# Patient Record
Sex: Male | Born: 1940 | Race: White | Hispanic: No | Marital: Married | Smoking: Never smoker
Health system: Southern US, Community
[De-identification: ages and names within clinical notes are randomized; demographics above are authoritative.]

## PROBLEM LIST (undated history)

## (undated) DIAGNOSIS — K8020 Calculus of gallbladder without cholecystitis without obstruction: Secondary | ICD-10-CM

## (undated) DIAGNOSIS — E669 Obesity, unspecified: Secondary | ICD-10-CM

## (undated) DIAGNOSIS — E1169 Type 2 diabetes mellitus with other specified complication: Secondary | ICD-10-CM

## (undated) DIAGNOSIS — I10 Essential (primary) hypertension: Secondary | ICD-10-CM

## (undated) DIAGNOSIS — L8990 Pressure ulcer of unspecified site, unspecified stage: Secondary | ICD-10-CM

## (undated) DIAGNOSIS — I219 Acute myocardial infarction, unspecified: Secondary | ICD-10-CM

## (undated) DIAGNOSIS — K859 Acute pancreatitis without necrosis or infection, unspecified: Secondary | ICD-10-CM

## (undated) DIAGNOSIS — H269 Unspecified cataract: Secondary | ICD-10-CM

## (undated) DIAGNOSIS — I82409 Acute embolism and thrombosis of unspecified deep veins of unspecified lower extremity: Secondary | ICD-10-CM

## (undated) DIAGNOSIS — K219 Gastro-esophageal reflux disease without esophagitis: Secondary | ICD-10-CM

## (undated) HISTORY — DX: Acute myocardial infarction, unspecified: I21.9

## (undated) HISTORY — DX: Unspecified cataract: H26.9

## (undated) HISTORY — DX: Acute embolism and thrombosis of unspecified deep veins of unspecified lower extremity: I82.409

## (undated) HISTORY — DX: Acute pancreatitis without necrosis or infection, unspecified: K85.90

## (undated) HISTORY — DX: Gastro-esophageal reflux disease without esophagitis: K21.9

---

## 2015-01-06 DIAGNOSIS — K859 Acute pancreatitis without necrosis or infection, unspecified: Secondary | ICD-10-CM

## 2015-01-06 DIAGNOSIS — I219 Acute myocardial infarction, unspecified: Secondary | ICD-10-CM

## 2015-01-06 HISTORY — DX: Acute pancreatitis without necrosis or infection, unspecified: K85.90

## 2015-01-06 HISTORY — DX: Acute myocardial infarction, unspecified: I21.9

## 2015-01-13 ENCOUNTER — Inpatient Hospital Stay (HOSPITAL_COMMUNITY)
Admission: EM | Admit: 2015-01-13 | Payer: Medicare Other | Source: Other Acute Inpatient Hospital | Admitting: Cardiology

## 2015-01-13 ENCOUNTER — Telehealth: Payer: Self-pay | Admitting: Gastroenterology

## 2015-01-13 ENCOUNTER — Encounter (HOSPITAL_COMMUNITY): Payer: Self-pay | Admitting: Internal Medicine

## 2015-01-13 ENCOUNTER — Inpatient Hospital Stay (HOSPITAL_COMMUNITY)
Admission: AD | Admit: 2015-01-13 | Discharge: 2015-02-21 | DRG: 423 | Disposition: A | Discharge status: Home Health | Payer: Medicare Other | Source: Other Acute Inpatient Hospital | Attending: Cardiothoracic Surgery | Admitting: Cardiothoracic Surgery

## 2015-01-13 DIAGNOSIS — I25110 Atherosclerotic heart disease of native coronary artery with unstable angina pectoris: Secondary | ICD-10-CM | POA: Diagnosis not present

## 2015-01-13 DIAGNOSIS — K8062 Calculus of gallbladder and bile duct with acute cholecystitis without obstruction: Secondary | ICD-10-CM | POA: Diagnosis present

## 2015-01-13 DIAGNOSIS — S36119A Unspecified injury of liver, initial encounter: Secondary | ICD-10-CM | POA: Diagnosis not present

## 2015-01-13 DIAGNOSIS — K8000 Calculus of gallbladder with acute cholecystitis without obstruction: Secondary | ICD-10-CM | POA: Diagnosis not present

## 2015-01-13 DIAGNOSIS — Z452 Encounter for adjustment and management of vascular access device: Secondary | ICD-10-CM

## 2015-01-13 DIAGNOSIS — K8050 Calculus of bile duct without cholangitis or cholecystitis without obstruction: Secondary | ICD-10-CM | POA: Diagnosis not present

## 2015-01-13 DIAGNOSIS — K920 Hematemesis: Secondary | ICD-10-CM | POA: Diagnosis not present

## 2015-01-13 DIAGNOSIS — I5021 Acute systolic (congestive) heart failure: Secondary | ICD-10-CM | POA: Diagnosis present

## 2015-01-13 DIAGNOSIS — D62 Acute posthemorrhagic anemia: Secondary | ICD-10-CM | POA: Diagnosis not present

## 2015-01-13 DIAGNOSIS — K760 Fatty (change of) liver, not elsewhere classified: Secondary | ICD-10-CM | POA: Diagnosis present

## 2015-01-13 DIAGNOSIS — R0989 Other specified symptoms and signs involving the circulatory and respiratory systems: Secondary | ICD-10-CM

## 2015-01-13 DIAGNOSIS — E861 Hypovolemia: Secondary | ICD-10-CM | POA: Diagnosis not present

## 2015-01-13 DIAGNOSIS — R570 Cardiogenic shock: Secondary | ICD-10-CM | POA: Diagnosis not present

## 2015-01-13 DIAGNOSIS — R001 Bradycardia, unspecified: Secondary | ICD-10-CM | POA: Diagnosis not present

## 2015-01-13 DIAGNOSIS — I2510 Atherosclerotic heart disease of native coronary artery without angina pectoris: Secondary | ICD-10-CM | POA: Diagnosis present

## 2015-01-13 DIAGNOSIS — A419 Sepsis, unspecified organism: Secondary | ICD-10-CM | POA: Diagnosis not present

## 2015-01-13 DIAGNOSIS — E1165 Type 2 diabetes mellitus with hyperglycemia: Secondary | ICD-10-CM | POA: Diagnosis present

## 2015-01-13 DIAGNOSIS — Z6823 Body mass index (BMI) 23.0-23.9, adult: Secondary | ICD-10-CM | POA: Diagnosis not present

## 2015-01-13 DIAGNOSIS — J189 Pneumonia, unspecified organism: Secondary | ICD-10-CM | POA: Diagnosis not present

## 2015-01-13 DIAGNOSIS — K2960 Other gastritis without bleeding: Secondary | ICD-10-CM | POA: Diagnosis present

## 2015-01-13 DIAGNOSIS — J9690 Respiratory failure, unspecified, unspecified whether with hypoxia or hypercapnia: Secondary | ICD-10-CM

## 2015-01-13 DIAGNOSIS — K2210 Ulcer of esophagus without bleeding: Secondary | ICD-10-CM | POA: Diagnosis present

## 2015-01-13 DIAGNOSIS — N179 Acute kidney failure, unspecified: Secondary | ICD-10-CM | POA: Diagnosis not present

## 2015-01-13 DIAGNOSIS — Z794 Long term (current) use of insulin: Secondary | ICD-10-CM | POA: Diagnosis not present

## 2015-01-13 DIAGNOSIS — J9601 Acute respiratory failure with hypoxia: Secondary | ICD-10-CM | POA: Diagnosis not present

## 2015-01-13 DIAGNOSIS — I214 Non-ST elevation (NSTEMI) myocardial infarction: Secondary | ICD-10-CM | POA: Diagnosis present

## 2015-01-13 DIAGNOSIS — K567 Ileus, unspecified: Secondary | ICD-10-CM | POA: Diagnosis not present

## 2015-01-13 DIAGNOSIS — N184 Chronic kidney disease, stage 4 (severe): Secondary | ICD-10-CM | POA: Diagnosis present

## 2015-01-13 DIAGNOSIS — I82403 Acute embolism and thrombosis of unspecified deep veins of lower extremity, bilateral: Secondary | ICD-10-CM | POA: Diagnosis present

## 2015-01-13 DIAGNOSIS — R579 Shock, unspecified: Secondary | ICD-10-CM | POA: Diagnosis not present

## 2015-01-13 DIAGNOSIS — K2100 Gastro-esophageal reflux disease with esophagitis, without bleeding: Secondary | ICD-10-CM

## 2015-01-13 DIAGNOSIS — K254 Chronic or unspecified gastric ulcer with hemorrhage: Secondary | ICD-10-CM | POA: Diagnosis present

## 2015-01-13 DIAGNOSIS — Z8249 Family history of ischemic heart disease and other diseases of the circulatory system: Secondary | ICD-10-CM | POA: Diagnosis not present

## 2015-01-13 DIAGNOSIS — K851 Biliary acute pancreatitis without necrosis or infection: Secondary | ICD-10-CM | POA: Diagnosis present

## 2015-01-13 DIAGNOSIS — I429 Cardiomyopathy, unspecified: Secondary | ICD-10-CM | POA: Diagnosis not present

## 2015-01-13 DIAGNOSIS — I255 Ischemic cardiomyopathy: Secondary | ICD-10-CM | POA: Diagnosis present

## 2015-01-13 DIAGNOSIS — Z9289 Personal history of other medical treatment: Secondary | ICD-10-CM

## 2015-01-13 DIAGNOSIS — I213 ST elevation (STEMI) myocardial infarction of unspecified site: Secondary | ICD-10-CM | POA: Diagnosis not present

## 2015-01-13 DIAGNOSIS — J9811 Atelectasis: Secondary | ICD-10-CM | POA: Diagnosis not present

## 2015-01-13 DIAGNOSIS — I9589 Other hypotension: Secondary | ICD-10-CM

## 2015-01-13 DIAGNOSIS — E876 Hypokalemia: Secondary | ICD-10-CM | POA: Diagnosis not present

## 2015-01-13 DIAGNOSIS — E43 Unspecified severe protein-calorie malnutrition: Secondary | ICD-10-CM | POA: Diagnosis present

## 2015-01-13 DIAGNOSIS — K8020 Calculus of gallbladder without cholecystitis without obstruction: Secondary | ICD-10-CM

## 2015-01-13 DIAGNOSIS — J690 Pneumonitis due to inhalation of food and vomit: Secondary | ICD-10-CM | POA: Diagnosis not present

## 2015-01-13 DIAGNOSIS — K810 Acute cholecystitis: Secondary | ICD-10-CM | POA: Diagnosis not present

## 2015-01-13 DIAGNOSIS — D72829 Elevated white blood cell count, unspecified: Secondary | ICD-10-CM | POA: Diagnosis not present

## 2015-01-13 DIAGNOSIS — I82409 Acute embolism and thrombosis of unspecified deep veins of unspecified lower extremity: Secondary | ICD-10-CM | POA: Diagnosis present

## 2015-01-13 DIAGNOSIS — D696 Thrombocytopenia, unspecified: Secondary | ICD-10-CM | POA: Diagnosis present

## 2015-01-13 DIAGNOSIS — N19 Unspecified kidney failure: Secondary | ICD-10-CM | POA: Diagnosis not present

## 2015-01-13 DIAGNOSIS — K449 Diaphragmatic hernia without obstruction or gangrene: Secondary | ICD-10-CM | POA: Diagnosis present

## 2015-01-13 DIAGNOSIS — I959 Hypotension, unspecified: Secondary | ICD-10-CM | POA: Insufficient documentation

## 2015-01-13 DIAGNOSIS — R55 Syncope and collapse: Secondary | ICD-10-CM | POA: Diagnosis present

## 2015-01-13 DIAGNOSIS — I10 Essential (primary) hypertension: Secondary | ICD-10-CM | POA: Diagnosis present

## 2015-01-13 DIAGNOSIS — R112 Nausea with vomiting, unspecified: Secondary | ICD-10-CM | POA: Insufficient documentation

## 2015-01-13 DIAGNOSIS — K921 Melena: Secondary | ICD-10-CM | POA: Diagnosis not present

## 2015-01-13 DIAGNOSIS — R1013 Epigastric pain: Secondary | ICD-10-CM

## 2015-01-13 DIAGNOSIS — Z86718 Personal history of other venous thrombosis and embolism: Secondary | ICD-10-CM | POA: Diagnosis not present

## 2015-01-13 DIAGNOSIS — E669 Obesity, unspecified: Secondary | ICD-10-CM | POA: Diagnosis present

## 2015-01-13 DIAGNOSIS — E874 Mixed disorder of acid-base balance: Secondary | ICD-10-CM | POA: Diagnosis not present

## 2015-01-13 DIAGNOSIS — K859 Acute pancreatitis without necrosis or infection, unspecified: Secondary | ICD-10-CM | POA: Diagnosis present

## 2015-01-13 DIAGNOSIS — Z951 Presence of aortocoronary bypass graft: Secondary | ICD-10-CM

## 2015-01-13 DIAGNOSIS — R1911 Absent bowel sounds: Secondary | ICD-10-CM

## 2015-01-13 DIAGNOSIS — R1310 Dysphagia, unspecified: Secondary | ICD-10-CM | POA: Diagnosis not present

## 2015-01-13 DIAGNOSIS — J9600 Acute respiratory failure, unspecified whether with hypoxia or hypercapnia: Secondary | ICD-10-CM

## 2015-01-13 DIAGNOSIS — Z01810 Encounter for preprocedural cardiovascular examination: Secondary | ICD-10-CM | POA: Diagnosis not present

## 2015-01-13 DIAGNOSIS — R6521 Severe sepsis with septic shock: Secondary | ICD-10-CM | POA: Diagnosis not present

## 2015-01-13 DIAGNOSIS — I471 Supraventricular tachycardia: Secondary | ICD-10-CM | POA: Diagnosis not present

## 2015-01-13 DIAGNOSIS — K2211 Ulcer of esophagus with bleeding: Secondary | ICD-10-CM | POA: Diagnosis present

## 2015-01-13 DIAGNOSIS — K819 Cholecystitis, unspecified: Secondary | ICD-10-CM

## 2015-01-13 DIAGNOSIS — I248 Other forms of acute ischemic heart disease: Secondary | ICD-10-CM | POA: Diagnosis present

## 2015-01-13 DIAGNOSIS — R0602 Shortness of breath: Secondary | ICD-10-CM

## 2015-01-13 DIAGNOSIS — O2230 Deep phlebothrombosis in pregnancy, unspecified trimester: Secondary | ICD-10-CM

## 2015-01-13 DIAGNOSIS — Y95 Nosocomial condition: Secondary | ICD-10-CM | POA: Diagnosis not present

## 2015-01-13 DIAGNOSIS — E119 Type 2 diabetes mellitus without complications: Secondary | ICD-10-CM

## 2015-01-13 DIAGNOSIS — R945 Abnormal results of liver function studies: Secondary | ICD-10-CM

## 2015-01-13 DIAGNOSIS — N170 Acute kidney failure with tubular necrosis: Secondary | ICD-10-CM | POA: Diagnosis present

## 2015-01-13 DIAGNOSIS — K208 Other esophagitis: Secondary | ICD-10-CM | POA: Diagnosis not present

## 2015-01-13 DIAGNOSIS — R1110 Vomiting, unspecified: Secondary | ICD-10-CM

## 2015-01-13 DIAGNOSIS — I129 Hypertensive chronic kidney disease with stage 1 through stage 4 chronic kidney disease, or unspecified chronic kidney disease: Secondary | ICD-10-CM | POA: Diagnosis present

## 2015-01-13 DIAGNOSIS — R609 Edema, unspecified: Secondary | ICD-10-CM | POA: Diagnosis not present

## 2015-01-13 DIAGNOSIS — E46 Unspecified protein-calorie malnutrition: Secondary | ICD-10-CM | POA: Diagnosis present

## 2015-01-13 DIAGNOSIS — R578 Other shock: Secondary | ICD-10-CM | POA: Diagnosis not present

## 2015-01-13 DIAGNOSIS — R7989 Other specified abnormal findings of blood chemistry: Secondary | ICD-10-CM

## 2015-01-13 HISTORY — DX: Essential (primary) hypertension: I10

## 2015-01-13 HISTORY — DX: Calculus of gallbladder without cholecystitis without obstruction: K80.20

## 2015-01-13 HISTORY — DX: Obesity, unspecified: E11.69

## 2015-01-13 HISTORY — DX: Type 2 diabetes mellitus with other specified complication: E66.9

## 2015-01-13 LAB — GLUCOSE, CAPILLARY: GLUCOSE-CAPILLARY: 222 mg/dL — AB (ref 70–99)

## 2015-01-13 LAB — LIPASE, BLOOD: LIPASE: 1230 U/L — AB (ref 22–51)

## 2015-01-13 LAB — LACTIC ACID, PLASMA
LACTIC ACID, VENOUS: 1.7 mmol/L (ref 0.5–2.0)
Lactic Acid, Venous: 1.4 mmol/L (ref 0.5–2.0)

## 2015-01-13 LAB — TROPONIN I: Troponin I: 2.48 ng/mL (ref <0.031)

## 2015-01-13 LAB — MRSA PCR SCREENING: MRSA by PCR: NEGATIVE

## 2015-01-13 MED ORDER — ASPIRIN 81 MG PO CHEW
81.0000 mg | CHEWABLE_TABLET | Freq: Every day | ORAL | Status: DC
  Administered 2015-01-14 – 2015-01-20 (×6): 81 mg via ORAL
  Filled 2015-01-13 (×7): qty 1

## 2015-01-13 MED ORDER — MORPHINE SULFATE 4 MG/ML IJ SOLN
4.0000 mg | Freq: Once | INTRAMUSCULAR | Status: AC
  Administered 2015-01-13: 4 mg via INTRAVENOUS

## 2015-01-13 MED ORDER — LACTATED RINGERS IV BOLUS (SEPSIS)
1000.0000 mL | Freq: Once | INTRAVENOUS | Status: AC
  Administered 2015-01-13: 1000 mL via INTRAVENOUS

## 2015-01-13 MED ORDER — MORPHINE SULFATE 4 MG/ML IJ SOLN
4.0000 mg | INTRAMUSCULAR | Status: DC | PRN
  Administered 2015-01-13 – 2015-01-15 (×12): 4 mg via INTRAVENOUS
  Filled 2015-01-13 (×13): qty 1

## 2015-01-13 MED ORDER — SODIUM CHLORIDE 0.9 % IV SOLN
INTRAVENOUS | Status: DC
  Administered 2015-01-13 – 2015-01-17 (×9): via INTRAVENOUS

## 2015-01-13 MED ORDER — ASPIRIN 325 MG PO TABS
325.0000 mg | ORAL_TABLET | ORAL | Status: AC
  Administered 2015-01-13: 325 mg via ORAL
  Filled 2015-01-13: qty 1

## 2015-01-13 MED ORDER — MORPHINE SULFATE 4 MG/ML IJ SOLN
INTRAMUSCULAR | Status: AC
  Filled 2015-01-13: qty 1

## 2015-01-13 MED ORDER — ONDANSETRON HCL 4 MG PO TABS: 4.0000 mg | ORAL_TABLET | Freq: Four times a day (QID) | ORAL | Status: DC | PRN

## 2015-01-13 MED ORDER — HEPARIN (PORCINE) IN NACL 100-0.45 UNIT/ML-% IJ SOLN
1800.0000 [IU]/h | INTRAMUSCULAR | Status: DC
  Administered 2015-01-14: 900 [IU]/h via INTRAVENOUS
  Administered 2015-01-14: 1050 [IU]/h via INTRAVENOUS
  Administered 2015-01-16: 1250 [IU]/h via INTRAVENOUS
  Administered 2015-01-16: 1600 [IU]/h via INTRAVENOUS
  Administered 2015-01-17: 1800 [IU]/h via INTRAVENOUS
  Filled 2015-01-13 (×7): qty 250

## 2015-01-13 MED ORDER — ONDANSETRON HCL 4 MG/2ML IJ SOLN
4.0000 mg | Freq: Four times a day (QID) | INTRAMUSCULAR | Status: DC | PRN
  Administered 2015-01-14: 4 mg via INTRAVENOUS
  Filled 2015-01-13: qty 2

## 2015-01-13 MED ORDER — HYDRALAZINE HCL 20 MG/ML IJ SOLN
10.0000 mg | Freq: Four times a day (QID) | INTRAMUSCULAR | Status: DC | PRN
Start: 2015-01-13 — End: 2015-01-21
  Administered 2015-01-13 – 2015-01-18 (×2): 10 mg via INTRAVENOUS
  Filled 2015-01-13 (×4): qty 1

## 2015-01-13 MED ORDER — INSULIN ASPART 100 UNIT/ML ~~LOC~~ SOLN
0.0000 [IU] | SUBCUTANEOUS | Status: DC
  Administered 2015-01-13: 3 [IU] via SUBCUTANEOUS
  Administered 2015-01-14 (×2): 100 [IU] via SUBCUTANEOUS
  Administered 2015-01-14 (×3): 2 [IU] via SUBCUTANEOUS
  Administered 2015-01-14 – 2015-01-15 (×2): 1 [IU] via SUBCUTANEOUS
  Administered 2015-01-15: 2 [IU] via SUBCUTANEOUS
  Administered 2015-01-15 – 2015-01-16 (×4): 1 [IU] via SUBCUTANEOUS
  Administered 2015-01-16 (×2): 2 [IU] via SUBCUTANEOUS
  Administered 2015-01-17: 1 [IU] via SUBCUTANEOUS
  Administered 2015-01-18: 21:00:00 2 [IU] via SUBCUTANEOUS
  Administered 2015-01-18: 13:00:00 1 [IU] via SUBCUTANEOUS
  Administered 2015-01-18 – 2015-01-19 (×4): 2 [IU] via SUBCUTANEOUS
  Administered 2015-01-19: 05:00:00 1 [IU] via SUBCUTANEOUS

## 2015-01-13 NOTE — H&P (Signed)
PCP:   Monico Blitz, MD   Chief Complaint:  abd pain, passed out  HPI: 74 yo male h/o htn, dm presented to [facility] ED after a syncopal episode in Sunday school today.  Pt reports since earlier this am before church he had been having persistent epigastric abdominal pain with associated nausea.  The pain progressively got worse, and up to an 8/10 in school when he passed out.  EMS was called noted that he was bradycardic in the 30s with sbp documented in the mid 60s.  Initially thought patient was having NSTEMI until patient mentioned to the ED doc at [facility] that he had been having abdominal pain.  Pt denies any issues prior to this am.  He did vomit after he passed out.  No diarrhea.  Last BM this am.  He has been told he has a gallbladder full of stones but have been asymptomatic up until today.  No h/o pancreatitis.  Denies cp or sob.  His pain is better with some morphine received.  Pt seems very stoic about his pain, and takes a lot of effort to get actual story of what has been occurring, does not complain much.  There are about ten family members in the room.  In ED at [facility], ct scan showed cbd stone, pancreatitis.  Lip over 300 no actual level.  Trop negative.  Ast/alt elevated 247/212 tbili 1.9 alk phos normal.  Lactic acid level 2.2 .  Creat 1.7.  ekg no acute issues.   Both [facility] and cardiology services have been notified of consultations already and are aware of [facility]  Review of Systems:  Positive and negative as per HPI otherwise all other systems are negative  Past Medical History: Dm, htn, gallstones   Medications: Prior to Admission medications   Medication Sig Start Date End Date Taking? Authorizing Provider  atorvastatin (LIPITOR) 10 MG tablet Take 10 mg by mouth daily.   Yes Historical Provider, MD  glipiZIDE (GLUCOTROL XL) 10 MG 24 hr tablet Take 10 mg by mouth 2 (two) times daily. 12/19/14  Yes Historical Provider, MD  latanoprost (XALATAN)  0.005 % ophthalmic solution Place 1 drop into both eyes at bedtime. 01/02/15  Yes Historical Provider, MD  lisinopril-hydrochlorothiazide (PRINZIDE,ZESTORETIC) 20-12.5 MG per tablet Take 1 tablet by mouth daily.   Yes Historical Provider, MD  sildenafil (VIAGRA) 100 MG tablet Take 100 mg by mouth daily as needed for erectile dysfunction.   Yes Historical Provider, MD  sitaGLIPtin (JANUVIA) 100 MG tablet Take 100 mg by mouth daily.   Yes Historical Provider, MD  vardenafil (LEVITRA) 20 MG tablet Take 20 mg by mouth daily as needed for erectile dysfunction.   Yes Historical Provider, MD    Allergies:  No Known Allergies  Social History: Neg x 3  Family History: Reviewed and uncontributory  Physical Exam: Filed Vitals:   01/13/15 1845 01/13/15 1900 01/13/15 1915 01/13/15 1930  BP: 188/76 186/80 195/88 193/72  Pulse: 72 70 71 74  Resp: 16 15 17 18   SpO2: 98% 99% 98% 98%   General appearance: alert, cooperative and no distress stoic Head: Normocephalic, without obvious abnormality, atraumatic Eyes: negative Nose: Nares normal. Septum midline. Mucosa normal. No drainage or sinus tenderness. Neck: no JVD and supple, symmetrical, trachea midline Lungs: clear to auscultation bilaterally Heart: regular rate and rhythm, S1, S2 normal, no murmur, click, rub or gallop Abdomen: soft, non-tender; bowel sounds normal; no masses,  no organomegaly mild distention Extremities: extremities normal,  atraumatic, no cyanosis or edema Pulses: 2+ and symmetric Skin: Skin color, texture, turgor normal. No rashes or lesions Neurologic: Grossly normal    Labs on Admission:  Reviewed all from [facility]     Radiological Exams on Admission: No results found.   Reviewed from [facility] records  Assessment/Plan  74 yo male with syncopal episode and acute gallstone pancreatitis  Principal Problem:   Acute gallstone pancreatitis-  Ivf.  Repeat lipase level and lactic acid level now.  Keep npo x ice  chips.  Hold anticoagulants until seen by GI for possible ERCP.  Will need cholecystecomy at some point.  abd exam benign.  Prn morphine and zofran ordered.  Syncopal episode was likely vasovagal in this setting.   Active Problems:   Abdominal pain, epigastric   Nausea with vomiting   Elevated LFTs   Gallstones   Syncope-  Likely vasovagal, serial cardiac enzymes.  Family is requesting cardiology to be consulted, will notify of cardiology of arrival to floor.   Hypotension-  Resolved with ivf, monitor closely in stepdown   Bradycardia-  Resolved with ivf, monitor closely in stepdown   AKI (acute kidney injury)-  Unknown baseline.  Ivf.     Diabetes-  Place on ssi.  hga1c ordered.  Admit to stepdown.  Full code.  Harlow Carrizales A 01/13/2015, 7:49 PM

## 2015-01-13 NOTE — Consult Note (Addendum)
Referring Physician: No referring provider defined for this encounter. Primary Physician: Monico Blitz, MD Primary Cardiologist: none Reason for Consultation: NSTEMI  HPI:  74 yo male with past medical history of hypertension, diabetes mellitus type 2, family history of early cardiac vascular disease (other with myocardial infarction in his 10s) was brought to [name] ED after a syncopal episode in Sunday school today. Pt reports since earlier this am before church he had been having persistent epigastric abdominal pain with associated nausea. The pain progressively got worse, and up to an 8/10 in school when he passed out. EMS was called noted that he was bradycardic in the 30s with sbp documented in the mid 60s. Initially thought patient was having NSTEMI until patient mentioned to the ED doc at [facility] that he had been having abdominal pain. Pt denies any issues prior to this am. He did vomit after he passed out. No diarrhea.  In ED at [facility], CT scan showed cbd stone, pancreatitis. Elevated lipase.Trop negative. Ast/alt elevated 247/212 tbili 1.9 alk phos normal. Lactic acid level 2.2.Creat 1.7. ekg no acute issues.Patient received 2 L of normal saline bolus at [facility] ED. He was transferred to [facility] for further care. Blood work at [facility] was notable for elevated lipase up to 1200 as well as troponin elevation up to 2.44.  Patient denied active symptoms of chest pain, shortness of breath, lower extremity edema, PND or orthopnea, frequent or prolonged palpitations. Patient continues with a belt-like upper abdominal pain currently 2 out of 10. He just received 4 mg of morphine prior to my arrival.  Review of Systems:  12 systems were reviewed nad were negative except mentioned in the HPI    Past Medical History  Diagnosis Date  . Hypertension   . Diabetes mellitus type 2 in obese   . Cholelithiasis    No past surgical history on file.   Current  Medications: . insulin aspart  0-9 Units Subcutaneous 6 times per day  . lactated ringers  1,000 mL Intravenous Once   Infusions:  . sodium chloride 150 mL/hr at 01/13/15 2040    Prescriptions prior to admission  Medication Sig Dispense Refill Last Dose  . atorvastatin (LIPITOR) 10 MG tablet Take 10 mg by mouth daily.   01/12/2015 at Unknown time  . glipiZIDE (GLUCOTROL XL) 10 MG 24 hr tablet Take 10 mg by mouth 2 (two) times daily.   01/12/2015 at Unknown time  . latanoprost (XALATAN) 0.005 % ophthalmic solution Place 1 drop into both eyes at bedtime.   01/12/2015 at Unknown time  . lisinopril-hydrochlorothiazide (PRINZIDE,ZESTORETIC) 20-12.5 MG per tablet Take 1 tablet by mouth daily.   01/13/2015 at Unknown time  . sildenafil (VIAGRA) 100 MG tablet Take 100 mg by mouth daily as needed for erectile dysfunction.   unknown  . sitaGLIPtin (JANUVIA) 100 MG tablet Take 100 mg by mouth daily.   01/12/2015 at Unknown time  . vardenafil (LEVITRA) 20 MG tablet Take 20 mg by mouth daily as needed for erectile dysfunction.   unknown     No Known Allergies  History   Social History  . Marital Status: Married    Spouse Name: N/A  . Number of Children: N/A  . Years of Education: N/A   Occupational History  . Not on file.   Social History Main Topics  . Smoking status: Not on file  . Smokeless tobacco: Not on file  . Alcohol Use: Not on file  . Drug Use: Not on  file  . Sexual Activity: Not on file   Other Topics Concern  . Not on file   Social History Narrative  . No narrative on file    Family History  Problem Relation Age of Onset  . Heart attack Brother 27   No family status information on file.    PHYSICAL EXAM: Filed Vitals:   01/13/15 2200  BP: 173/78  Pulse: 80  Temp:   Resp: 16     Intake/Output Summary (Last 24 hours) at 01/13/15 2317 Last data filed at 01/13/15 2200  Gross per 24 hour  Intake   2200 ml  Output    350 ml  Net   1850 ml    General:  Well  appearing. No respiratory difficulty HEENT: normal Neck: supple. no JVD. Carotids 2+ bilat; no bruits. No lymphadenopathy or thryomegaly appreciated. Cor: PMI nondisplaced. Regular rate & rhythm. No rubs, gallops or murmurs. Lungs: clear Abdomen: soft, tender, nondistended. No hepatosplenomegaly. No bruits or masses. Good bowel sounds. Extremities: no cyanosis, clubbing, rash, edema Neuro: alert & oriented x 3, cranial nerves grossly intact. moves all 4 extremities w/o difficulty. Affect pleasant.  Results for orders placed or performed during the hospital encounter of 01/13/15 (from the past 24 hour(s))  MRSA PCR Screening     Status: None   Collection Time: 01/13/15  6:33 PM  Result Value Ref Range   MRSA by PCR NEGATIVE NEGATIVE  Troponin I     Status: Abnormal   Collection Time: 01/13/15  8:12 PM  Result Value Ref Range   Troponin I 2.48 (HH) <0.031 ng/mL  Lipase, blood     Status: Abnormal   Collection Time: 01/13/15  8:12 PM  Result Value Ref Range   Lipase 1230 (H) 22 - 51 U/L  Lactic acid, plasma     Status: None   Collection Time: 01/13/15  8:12 PM  Result Value Ref Range   Lactic Acid, Venous 1.7 0.5 - 2.0 mmol/L  Glucose, capillary     Status: Abnormal   Collection Time: 01/13/15  8:19 PM  Result Value Ref Range   Glucose-Capillary 222 (H) 70 - 99 mg/dL   Radiology:  No results found.  ECG: Sinus rhythm 73 bpm, normal axis, downsloping ST segment depressions in leads V4 to V6 as well as lead III and aVF, looks like repolarization abnormalities of LVH however no voltage criteria for LVH present  ASSESSMENT:  1. NSTEMI 2. Acute gallstone pancreatitis 3. HTN 4. Syncope  DISCUSSION: Given description of patient's syncopal episode with heart rate documented being 30 bpm as well as blood pressure that was low and its likely vagal episode secondary to pain. Patient does have however significant elevation of his troponin up to 2.44. No acute ischemic abnormalities on  his electrocardiogram. Currently patient is hemodynamically stable and electrically stable and does not have apparent chest pain or shortness of breath. Appears to be euvolemic to hypovolemic on physical exam. Given all the above the initial conservative strategy with aspirin/heparin and cycling cardiac enzymes would be preferable especially in light of ongoing abdominal pain secondary to acute gallstone pancreatitis. Patient definitely has risk for coronary artery disease given his age, family history, hypertension and diabetes mellitus however I'm not 100% convinced that he has acute coronary syndrome secondary to coronary plaque rupture. He may well have had significant ischemic episode due to profound hypotension. Gastroenterology are planning to see patient in the morning.  Recommendations/plan:  - Lactated Ringer's 1 L bolus - ordered -  Aspirin 325 mg 1 and 81 mg daily - ordered - Heparin drip ACS nomogram - ordered - Continue monitoring troponins until down trending - Agree with holding lisinopril and hydration for now giving acute kidney injury - if HR remains stable will plan on starting metoprolol tomorrow. Bradycardia was likely vagal  Cardiology will continue to follow  Inez Pilgrim, MD 01/13/2015 11:17 PM

## 2015-01-13 NOTE — Progress Notes (Signed)
ANTICOAGULATION CONSULT NOTE - Initial Consult  Pharmacy Consult for heparin Indication: chest pain/ACS  No Known Allergies  Patient Measurements: Height: 6' (182.9 cm) Weight: 173 lb 1 oz (78.5 kg) IBW/kg (Calculated) : 77.6  Vital Signs: Temp: 97.9 F (36.6 C) (05/08 2000) Temp Source: Oral (05/08 2000) BP: 126/57 mmHg (05/08 2300) Pulse Rate: 79 (05/08 2300)  Labs:  Recent Labs  01/13/15 2012  TROPONINI 2.48*      Medical History: Past Medical History  Diagnosis Date  . Hypertension   . Diabetes mellitus type 2 in obese   . Cholelithiasis     Medications:  Prescriptions prior to admission  Medication Sig Dispense Refill Last Dose  . atorvastatin (LIPITOR) 10 MG tablet Take 10 mg by mouth daily.   01/12/2015 at Unknown time  . glipiZIDE (GLUCOTROL XL) 10 MG 24 hr tablet Take 10 mg by mouth 2 (two) times daily.   01/12/2015 at Unknown time  . latanoprost (XALATAN) 0.005 % ophthalmic solution Place 1 drop into both eyes at bedtime.   01/12/2015 at Unknown time  . lisinopril-hydrochlorothiazide (PRINZIDE,ZESTORETIC) 20-12.5 MG per tablet Take 1 tablet by mouth daily.   01/13/2015 at Unknown time  . sildenafil (VIAGRA) 100 MG tablet Take 100 mg by mouth daily as needed for erectile dysfunction.   unknown  . sitaGLIPtin (JANUVIA) 100 MG tablet Take 100 mg by mouth daily.   01/12/2015 at Unknown time  . vardenafil (LEVITRA) 20 MG tablet Take 20 mg by mouth daily as needed for erectile dysfunction.   unknown   Scheduled:  . insulin aspart  0-9 Units Subcutaneous 6 times per day  . lactated ringers  1,000 mL Intravenous Once   Infusions:  . sodium chloride 150 mL/hr at 01/13/15 2040    Assessment: 74yo male experienced syncopal event, tx'd from [facility] for acute gallstone pancreatitis, also noted w/ AKI, now w/ elevated troponin (apparently negative at [facility]), to begin heparin.  Goal of Therapy:  Heparin level 0.3-0.7 units/ml Monitor platelets by  anticoagulation protocol: Yes   Plan:  RN reports pt got heparin bolus at [facility] but timing unclear, did not arrive on heparin gtt; will begin heparin gtt at 900 units/hr and monitor heparin levels and CBC.  Vernard Gambles, PharmD, BCPS  01/13/2015,11:20 PM

## 2015-01-13 NOTE — Progress Notes (Signed)
   Chart reviewed - plan to see patient in AM but available if things change before then.  Iva Boop, MD, [facility] [phone]

## 2015-01-13 NOTE — Telephone Encounter (Signed)
I was called by [facility] about him.  It sounds like he is transferring from [facility] ER, accepted by [facility] hospitalist for gallstone pancreatitis. I asked [facility] to pass on word that when patient arrives at [facility] we should be called for consultation.

## 2015-01-14 ENCOUNTER — Encounter (HOSPITAL_COMMUNITY): Payer: Self-pay

## 2015-01-14 ENCOUNTER — Inpatient Hospital Stay (HOSPITAL_COMMUNITY): Payer: Medicare Other

## 2015-01-14 DIAGNOSIS — I10 Essential (primary) hypertension: Secondary | ICD-10-CM

## 2015-01-14 DIAGNOSIS — K859 Acute pancreatitis without necrosis or infection, unspecified: Secondary | ICD-10-CM | POA: Diagnosis present

## 2015-01-14 DIAGNOSIS — I213 ST elevation (STEMI) myocardial infarction of unspecified site: Secondary | ICD-10-CM

## 2015-01-14 DIAGNOSIS — K8050 Calculus of bile duct without cholangitis or cholecystitis without obstruction: Secondary | ICD-10-CM

## 2015-01-14 DIAGNOSIS — Z8249 Family history of ischemic heart disease and other diseases of the circulatory system: Secondary | ICD-10-CM

## 2015-01-14 LAB — COMPREHENSIVE METABOLIC PANEL
ALK PHOS: 63 U/L (ref 38–126)
ALT: 224 U/L — ABNORMAL HIGH (ref 17–63)
AST: 162 U/L — ABNORMAL HIGH (ref 15–41)
Albumin: 2.9 g/dL — ABNORMAL LOW (ref 3.5–5.0)
Anion gap: 7 (ref 5–15)
BILIRUBIN TOTAL: 2.5 mg/dL — AB (ref 0.3–1.2)
BUN: 24 mg/dL — ABNORMAL HIGH (ref 6–20)
CO2: 19 mmol/L — AB (ref 22–32)
Calcium: 7.4 mg/dL — ABNORMAL LOW (ref 8.9–10.3)
Chloride: 113 mmol/L — ABNORMAL HIGH (ref 101–111)
Creatinine, Ser: 1.54 mg/dL — ABNORMAL HIGH (ref 0.61–1.24)
GFR, EST AFRICAN AMERICAN: 50 mL/min — AB (ref >60)
GFR, EST NON AFRICAN AMERICAN: 43 mL/min — AB (ref >60)
GLUCOSE: 203 mg/dL — AB (ref 70–99)
POTASSIUM: 4 mmol/L (ref 3.5–5.1)
SODIUM: 139 mmol/L (ref 135–145)
Total Protein: 5.8 g/dL — ABNORMAL LOW (ref 6.5–8.1)

## 2015-01-14 LAB — CK TOTAL AND CKMB (NOT AT ARMC)
CK, MB: 40.7 ng/mL — ABNORMAL HIGH (ref 0.5–5.0)
RELATIVE INDEX: 9.3 — AB (ref 0.0–2.5)
Total CK: 437 U/L — ABNORMAL HIGH (ref 49–397)

## 2015-01-14 LAB — TROPONIN I
TROPONIN I: 6.57 ng/mL — AB (ref <0.031)
TROPONIN I: 6.48 ng/mL — AB (ref <0.031)
Troponin I: 3.94 ng/mL (ref <0.031)

## 2015-01-14 LAB — GLUCOSE, CAPILLARY
GLUCOSE-CAPILLARY: 109 mg/dL — AB (ref 70–99)
GLUCOSE-CAPILLARY: 122 mg/dL — AB (ref 70–99)
GLUCOSE-CAPILLARY: 153 mg/dL — AB (ref 70–99)
GLUCOSE-CAPILLARY: 164 mg/dL — AB (ref 70–99)
GLUCOSE-CAPILLARY: 174 mg/dL — AB (ref 70–99)
Glucose-Capillary: 127 mg/dL — ABNORMAL HIGH (ref 70–99)
Glucose-Capillary: 150 mg/dL — ABNORMAL HIGH (ref 70–99)

## 2015-01-14 LAB — CBC
HCT: 47.1 % (ref 39.0–52.0)
HEMATOCRIT: 47.6 % (ref 39.0–52.0)
Hemoglobin: 16.8 g/dL (ref 13.0–17.0)
Hemoglobin: 16.8 g/dL (ref 13.0–17.0)
MCH: 31.5 pg (ref 26.0–34.0)
MCH: 31 pg (ref 26.0–34.0)
MCHC: 35.7 g/dL (ref 30.0–36.0)
MCHC: 35.3 g/dL (ref 30.0–36.0)
MCV: 88.2 fL (ref 78.0–100.0)
MCV: 87.8 fL (ref 78.0–100.0)
Platelets: 120 10*3/uL — ABNORMAL LOW (ref 150–400)
Platelets: 119 10*3/uL — ABNORMAL LOW (ref 150–400)
RBC: 5.34 MIL/uL (ref 4.22–5.81)
RBC: 5.42 MIL/uL (ref 4.22–5.81)
RDW: 13.3 % (ref 11.5–15.5)
RDW: 13.4 % (ref 11.5–15.5)
WBC: 16.2 10*3/uL — ABNORMAL HIGH (ref 4.0–10.5)
WBC: 17.2 10*3/uL — ABNORMAL HIGH (ref 4.0–10.5)

## 2015-01-14 LAB — PROTIME-INR
INR: 1.19 (ref 0.00–1.49)
Prothrombin Time: 15.2 seconds (ref 11.6–15.2)

## 2015-01-14 LAB — HEPARIN LEVEL (UNFRACTIONATED)
HEPARIN UNFRACTIONATED: 0.23 [IU]/mL — AB (ref 0.30–0.70)
Heparin Unfractionated: 0.38 IU/mL (ref 0.30–0.70)

## 2015-01-14 LAB — HEMOGLOBIN A1C
Hgb A1c MFr Bld: 7.5 % — ABNORMAL HIGH (ref 4.8–5.6)
Mean Plasma Glucose: 169 mg/dL

## 2015-01-14 MED ORDER — CHLORHEXIDINE GLUCONATE 0.12 % MT SOLN
15.0000 mL | Freq: Two times a day (BID) | OROMUCOSAL | Status: DC
  Administered 2015-01-14 – 2015-01-23 (×16): 15 mL via OROMUCOSAL
  Filled 2015-01-14 (×25): qty 15

## 2015-01-14 MED ORDER — PNEUMOCOCCAL VAC POLYVALENT 25 MCG/0.5ML IJ INJ
0.5000 mL | INJECTION | INTRAMUSCULAR | Status: AC
  Administered 2015-01-15: 0.5 mL via INTRAMUSCULAR
  Filled 2015-01-14: qty 0.5

## 2015-01-14 MED ORDER — LATANOPROST 0.005 % OP SOLN
1.0000 [drp] | Freq: Every day | OPHTHALMIC | Status: DC
  Administered 2015-01-14 – 2015-02-20 (×36): 1 [drp] via OPHTHALMIC
  Filled 2015-01-14 (×4): qty 2.5

## 2015-01-14 MED ORDER — CETYLPYRIDINIUM CHLORIDE 0.05 % MT LIQD
7.0000 mL | Freq: Two times a day (BID) | OROMUCOSAL | Status: DC
  Administered 2015-01-14 – 2015-01-22 (×16): 7 mL via OROMUCOSAL

## 2015-01-14 MED ORDER — PERFLUTREN LIPID MICROSPHERE
1.0000 mL | INTRAVENOUS | Status: DC | PRN
  Administered 2015-01-14: 2 mL via INTRAVENOUS
  Filled 2015-01-14: qty 10

## 2015-01-14 NOTE — Consult Note (Signed)
[facility] Consult: 9:49 AM 01/14/2015  LOS: 1 day    Referring Provider: Dr Thereasa Solo  Primary Care Physician:  Monico Blitz, MD Primary Gastroenterologist:  Althia Forts.  Dr Jake Bathe did past colonoscopy.     Reason for Consultation:  Biliary pancreatitis, acute   HPI: [name] [name] is a 74 y.o. male.  [name] resident.  Hx obesity, type 2 DM.  Cholelithiasis previously diagnosed "20 years ago" but never clinically problematic.    Passed out at church yesterday.  HR in 30s, SBP in 60s per EMS Had eaten waffles and bacon in AM without incidence.  At Sunday school a few hours later, developed diffuse, non-focal abdominal pain.  Then had syncope and vomited.  No chest pain or palpitations. No trauma sustained.   [facility] CT shows pancreatitis, 3 mm distal CBD stone, gallstones without GB wall thickening.  Lipase > 300, today is 1230.  AST/ALT 247/212 (162/224 today), ti bili 1.9 (2.5 today), alk phos normal. BUN/CREAT to 24/1.5.  WBCs to 15.8.  Glucose 275. Elevated CPK and Troponin I, ST depression: ruled in for nonSTEMI, likely demand ischemia and started on IV Heparin.   Presently he is comfortable. Morphine, 12 mg thus far today, controlling pain.  No nausea at present.    Past Medical History  Diagnosis Date  . Hypertension   . Diabetes mellitus type 2 in obese   . Cholelithiasis     History reviewed. No pertinent past surgical history.  Prior to Admission medications   Medication Sig Start Date End Date Taking? Authorizing Provider  atorvastatin (LIPITOR) 10 MG tablet Take 10 mg by mouth daily.   Yes Historical Provider, MD  glipiZIDE (GLUCOTROL XL) 10 MG 24 hr tablet Take 10 mg by mouth 2 (two) times daily. 12/19/14  Yes Historical Provider, MD  latanoprost (XALATAN) 0.005 % ophthalmic solution Place 1  drop into both eyes at bedtime. 01/02/15  Yes Historical Provider, MD  lisinopril-hydrochlorothiazide (PRINZIDE,ZESTORETIC) 20-12.5 MG per tablet Take 1 tablet by mouth daily.   Yes Historical Provider, MD  sildenafil (VIAGRA) 100 MG tablet Take 100 mg by mouth daily as needed for erectile dysfunction.   Yes Historical Provider, MD  sitaGLIPtin (JANUVIA) 100 MG tablet Take 100 mg by mouth daily.   Yes Historical Provider, MD  vardenafil (LEVITRA) 20 MG tablet Take 20 mg by mouth daily as needed for erectile dysfunction.   Yes Historical Provider, MD    Scheduled Meds: . antiseptic oral rinse  7 mL Mouth Rinse q12n4p  . aspirin  81 mg Oral Daily  . chlorhexidine  15 mL Mouth Rinse BID  . insulin aspart  0-9 Units Subcutaneous 6 times per day  . [START ON 01/15/2015] pneumococcal 23 valent vaccine  0.5 mL Intramuscular Tomorrow-1000   Infusions: . sodium chloride 150 mL/hr at 01/14/15 0729  . heparin 900 Units/hr (01/14/15 0015)   PRN Meds: hydrALAZINE, morphine injection, ondansetron **OR** ondansetron (ZOFRAN) IV   Allergies as of 01/13/2015  . (No Known Allergies)    Family History  Problem Relation Age of Onset  .  Heart attack Brother 29  . Pancreatic cancer Sister 70    died in her 21s.     *   Daughter has digestive issues of unspecified type.    History   Social History  . Marital Status: Married    Spouse Name: N/A  . Number of Children: N/A  . Years of Education: N/A   Occupational History  . Not on file.   Social History Main Topics  . Smoking status: Never Smoker   . Smokeless tobacco: Not on file  . Alcohol Use: No  . Drug Use: No  . Sexual Activity: Yes   Other Topics Concern  . Not on file   Social History Narrative    REVIEW OF SYSTEMS: Constitutional:  Weight stable. No lack of energy or weakness.  Able to perform multiple home-based tasks such as gardening, yard work. Regular fishing and long walks. ENT:  No nose bleeds Pulm:  No dyspnea on  exertion. No cough. CV:  No palpitations, no LE edema. No chest pain. GU:  No hematuria, no frequency.  No nocturia. GI:  No heartburn, no dyspepsia. No previous issues with abdominal pain. Infrequent constipation. Heme:  No issues with low blood counts or excessive bleeding/bruising.   Transfusions:  None Neuro:  No headaches, no peripheral tingling or numbness.  Syncope as per HPI. Derm:  No itching, no rash or sores. No jaundice Endocrine:  No sweats or chills.  No polyuria or dysuria Immunization:  Did not inquire.   PHYSICAL EXAM: Vital signs in last 24 hours: Filed Vitals:   01/14/15 0723  BP: 138/78  Pulse: 82  Temp: 98.2 F (36.8 C)  Resp: 21   Wt Readings from Last 3 Encounters:  01/14/15 176 lb 9.4 oz (80.1 kg)   General: Pleasant, comfortable, well appearing WF. Head:   No facial asymmetry, no signs of head trauma. No facial edema. Eyes:  No scleral icterus, no conjunctival pallor. EOMI. Ears:  No hearing deficits  Nose:  No congestion or discharge Mouth:  Clear, moist, dentition in good repair. Neck:  No JVD, no TMG, no masses. Lungs:  A few fine crackles in the bases. No increased work of breathing. No cough. Heart: RRR. No MRG. S1/S2 audible. Abdomen:  Protuberant, tense. Active bowel sounds.  NT.  No mass or HSM. No bruits..   Rectal: Firm.   Musc/Skeltl: No joint swelling, erythema or gross deformities. Extremities:  No CCE. Feet are warm and well perfused.  Neurologic:  Alert, oriented 3. No tremor, no limb weakness. No gross neurologic deformities. Skin:  No telangiectasia, no obvious jaundice, no rash, no sores.  Tattoos:  None seen Nodes:  No cervical or inguinal adenopathy.   Psych:  Pleasant, alert, relaxed.   Intake/Output from previous day: 05/08 0701 - 05/09 0700 In: 4448.3 [I.V.:1448.3; IV Piggyback:1000] Out: 600 [Urine:600] Intake/Output this shift:    LAB RESULTS:  Recent Labs  01/14/15 0122 01/14/15 0816  WBC 16.2* 17.2*  HGB  16.8 16.8  HCT 47.1 47.6  PLT 120* PENDING   BMET Lab Results  Component Value Date   NA 139 01/14/2015   K 4.0 01/14/2015   CL 113* 01/14/2015   CO2 19* 01/14/2015   GLUCOSE 203* 01/14/2015   BUN 24* 01/14/2015   CREATININE 1.54* 01/14/2015   CALCIUM 7.4* 01/14/2015   LFT  Recent Labs  01/14/15 0122  PROT 5.8*  ALBUMIN 2.9*  AST 162*  ALT 224*  ALKPHOS 63  BILITOT 2.5*  PT/INR Lab Results  Component Value Date   INR 1.19 01/14/2015   Hepatitis Panel No results for input(s): HEPBSAG, HCVAB, HEPAIGM, HEPBIGM in the last 72 hours. C-Diff No components found for: CDIFF Lipase     Component Value Date/Time   LIPASE 1230* 01/13/2015 2012     ENDOSCOPIC STUDIES: Previous colonoscopy in [place].  Dr Rowe Pavy.   IMPRESSION:   *  Biliary pancreatitis.  Clinically stable.   *  Choledocholithiasis.    *  Cholelithiasis without obvious cholecystitis.  *  Type 2 DM  *  Elevated CPK.  Elevated Troponin I.  Non STEMI.  Cardiology following. Demand ischemia is felt to be culprit. On IV heparin x 48 hours. "At some point, he will need an ischemic w/u but would not cath now as we could not commit him to dual antiplatelet therapy at this time with pending GI issues".  Per Dr Irish Lack.  No sxs pre illness worrisome for CAD.   *  Acute renal failure.    PLAN:     *  Will need ERCP, timing per Dr Carlean Purl. Prefer to hold on this for at least 24 hours, allowing time to cool down.  In meantime will allow clears as tolerated.  Lipase, CMET, CBC in AM.     Azucena Freed  01/14/2015, 9:49 AM Pager: [phone]  [facility] Attending  I have also seen and assessed the patient and agree with the advanced practitioner's assessment and plan. I have seen CT images that show choledocholithiasis, cholelithiasis and pancreatitis. He should have an ERCP when improved, probably 1-3 days from now but depending upon cardiac status could be deferred longer. I have explained plans to him and  daughters. OK to leave on heparin at this time.  He does seem to have an ileus - distended abdomen. No flatus at this time   Gatha Mayer, MD, [facility] [phone] (pager) 01/14/2015 9:06 PM

## 2015-01-14 NOTE — Progress Notes (Signed)
Lishmanov, MD, was notified that pt trop level increased to 3.94. [facility] was notified and updated.  Caralee Ates, RN

## 2015-01-14 NOTE — Progress Notes (Signed)
[name] TEAM 1 - Stepdown/ICU TEAM Progress Note  [name] [name] EXN:[id number] DOB: 28-Sep-1940 DOA: 01/13/2015 PCP: [name] Blitz, MD  Admit HPI / Brief Narrative: 74 yo male h/o HTN and DM who presented to [facility] ED after a syncopal episode.  He had been having persistent epigastric abdominal pain with associated nausea. The pain progressively got worse, and up to an 8/10 when he passed out. EMS noted he was bradycardic in the 30s with sbp documented in the mid 60s.He had previously been told he has a gallbladder full of stones but had been asymptomatic w/ no h/o pancreatitis.   In ED at [place] showed cbd stone and pancreatitis. Lip was >300 (no actual level).  AST/ALT 247/212 -  tbili 1.9 - alk phos normal. Lactic acid level 2.2 . Creat 1.7.  The pt was transferred to [facility].   HPI/Subjective: The patient is quite stoic and denies any complaints whatsoever.  He is clearly focused on getting home as soon as possible.  He voices understanding of the many serious complex issues that he is currently facing but seems wholly unmoved emotionally.  Assessment/Plan:  Acute Gallstone pancreatitis  Plan is for ERCP - timing per GI - clinically the patient appears to be slowly improving from this standpoint  Syncope  Initially felt to be Vagal due to pain - continue to follow on telemetry  Bradycardia  Initially felt to be Vagal - has not recurred while inpatient - follow on telemetry  NSTEMI IV heparin per Cards - given WMA on TTE suspect cardiac cath will be indicated   Newly diagnosed systolic congestive heart failure No ACE inhibitor due to acute kidney injury - as patient is not acutely decompensated and with elevated troponins with suspicion of coronary artery disease will initiate low dose of beta blocker and titrate as able  Acute kidney injury  Judicious volume resuscitation - follow trend  Hypotension  Resolved with volume expansion  HTN Now recurring - begin  low-dose beta blocker  DM Reasonably well controlled at the present time  Code Status: FULL Family Communication: Lengthy discussion with patient and 2 daughters at bedside Disposition Plan: SDU - timing of ERCP per GI - timing of cardiac cath per cardiology  Consultants: GI [name] Cardiology   Procedures: TTE - 5/9 - EF 30-40% - akinesis of basal-midinferior myocardium - severe hypokinesis lateral myocardium - grade 1 DD  Antibiotics: none  DVT prophylaxis: IV heparin   Objective: Blood pressure 138/69, pulse 87, temperature 98.4 F (36.9 C), temperature source Oral, resp. rate 22, height 6' (1.829 m), weight 80.1 kg (176 lb 9.4 oz), SpO2 99 %.  Intake/Output Summary (Last 24 hours) at 01/14/15 1450 Last data filed at 01/14/15 1138  Gross per 24 hour  Intake 5048.25 ml  Output    600 ml  Net 4448.25 ml   Exam: General: No acute respiratory distress - alert and oriented Lungs: Clear to auscultation bilaterally without wheezes or crackles Cardiovascular: Regular rate and rhythm without murmur gallop or rub normal S1 and S2 Abdomen: Modestly tender in epigastrium, protuberant but soft, bowel sounds positive, no rebound, no ascites, no appreciable mass Extremities: No significant cyanosis, clubbing, or edema bilateral lower extremities  Data Reviewed: Basic Metabolic Panel:  Recent Labs Lab 01/14/15 0122  NA 139  K 4.0  CL 113*  CO2 19*  GLUCOSE 203*  BUN 24*  CREATININE 1.54*  CALCIUM 7.4*    CBC:  Recent Labs Lab 01/14/15 0122 01/14/15 4967  WBC 16.2* 17.2*  HGB 16.8 16.8  HCT 47.1 47.6  MCV 88.2 87.8  PLT 120* 119*    Liver Function Tests:  Recent Labs Lab 01/14/15 0122  AST 162*  ALT 224*  ALKPHOS 63  BILITOT 2.5*  PROT 5.8*  ALBUMIN 2.9*    Recent Labs Lab 01/13/15 2012  LIPASE 1230*    Coags:  Recent Labs Lab 01/14/15 0122  INR 1.19    Cardiac Enzymes:  Recent Labs Lab 01/13/15 2012 01/14/15 0122  01/14/15 0816  CKTOTAL  --   --  437*  CKMB  --   --  40.7*  TROPONINI 2.48* 3.94* 6.57*  6.48*    CBG:  Recent Labs Lab 01/13/15 2019 01/14/15 0024 01/14/15 0354 01/14/15 0722 01/14/15 1238  GLUCAP 222* 164* 174* 127* 109*    Recent Results (from the past 240 hour(s))  MRSA PCR Screening     Status: None   Collection Time: 01/13/15  6:33 PM  Result Value Ref Range Status   MRSA by PCR NEGATIVE NEGATIVE Final    Comment:        The GeneXpert MRSA Assay (FDA approved for NASAL specimens only), is one component of a comprehensive MRSA colonization surveillance program. It is not intended to diagnose MRSA infection nor to guide or monitor treatment for MRSA infections.      Studies:   Recent x-ray studies have been reviewed in detail by the Attending Physician  Scheduled Meds:  Scheduled Meds: . antiseptic oral rinse  7 mL Mouth Rinse q12n4p  . aspirin  81 mg Oral Daily  . chlorhexidine  15 mL Mouth Rinse BID  . insulin aspart  0-9 Units Subcutaneous 6 times per day  . latanoprost  1 drop Both Eyes QHS  . [START ON 01/15/2015] pneumococcal 23 valent vaccine  0.5 mL Intramuscular Tomorrow-1000    Time spent on care of this patient: 35 mins   MCCLUNG,JEFFREY T , MD   [facility] Office  [phone] Pager - Text Page per Shea Evans as per below:  On-Call/Text Page:      [email or web address]      password [email or web address]  If 7PM-7AM, please contact night-coverage [email or web address] Password [email or web address] 01/14/2015, 2:50 PM   LOS: 1 day

## 2015-01-14 NOTE — Progress Notes (Signed)
01/14/2015 Patient troponin 1 was 6.48 at 0816, did call Dr Lance Muss (Cardiology) at 0900 and he was made aware. [facility] RN.

## 2015-01-14 NOTE — Progress Notes (Signed)
CRITICAL VALUE ALERT  Critical value received:  Troponin 2.48  Date of notification:  01/13/15  Time of notification:  2145  Critical value read back:Yes.    Nurse who received alert:  Caralee Ates, RN  MD notified (1st page):  Rich Reining., MD  Time of first page:  2150  MD notified (2nd page):  Time of second page:  Responding MD:  Hiram Comber  Time MD responded:  2152

## 2015-01-14 NOTE — Progress Notes (Signed)
ANTICOAGULATION CONSULT NOTE  Pharmacy Consult for heparin Indication: chest pain/ACS  No Known Allergies  Patient Measurements: Height: 6' (182.9 cm) Weight: 176 lb 9.4 oz (80.1 kg) IBW/kg (Calculated) : 77.6  Vital Signs: Temp: 98.2 F (36.8 C) (05/09 0723) Temp Source: Oral (05/09 0723) BP: 138/78 mmHg (05/09 0723) Pulse Rate: 82 (05/09 0723)  Labs:  Recent Labs  01/13/15 2012 01/14/15 0122 01/14/15 0816  HGB  --  16.8 16.8  HCT  --  47.1 47.6  PLT  --  120* 119*  LABPROT  --  15.2  --   INR  --  1.19  --   HEPARINUNFRC  --   --  0.23*  CREATININE  --  1.54*  --   TROPONINI 2.48* 3.94* 6.48*      Medical History: Past Medical History  Diagnosis Date  . Hypertension   . Diabetes mellitus type 2 in obese   . Cholelithiasis     Medications:  Prescriptions prior to admission  Medication Sig Dispense Refill Last Dose  . atorvastatin (LIPITOR) 10 MG tablet Take 10 mg by mouth daily.   01/12/2015 at Unknown time  . glipiZIDE (GLUCOTROL XL) 10 MG 24 hr tablet Take 10 mg by mouth 2 (two) times daily.   01/12/2015 at Unknown time  . latanoprost (XALATAN) 0.005 % ophthalmic solution Place 1 drop into both eyes at bedtime.   01/12/2015 at Unknown time  . lisinopril-hydrochlorothiazide (PRINZIDE,ZESTORETIC) 20-12.5 MG per tablet Take 1 tablet by mouth daily.   01/13/2015 at Unknown time  . sildenafil (VIAGRA) 100 MG tablet Take 100 mg by mouth daily as needed for erectile dysfunction.   unknown  . sitaGLIPtin (JANUVIA) 100 MG tablet Take 100 mg by mouth daily.   01/12/2015 at Unknown time  . vardenafil (LEVITRA) 20 MG tablet Take 20 mg by mouth daily as needed for erectile dysfunction.   unknown   Scheduled:  . antiseptic oral rinse  7 mL Mouth Rinse q12n4p  . aspirin  81 mg Oral Daily  . chlorhexidine  15 mL Mouth Rinse BID  . insulin aspart  0-9 Units Subcutaneous 6 times per day  . latanoprost  1 drop Both Eyes QHS  . [START ON 01/15/2015] pneumococcal 23 valent vaccine   0.5 mL Intramuscular Tomorrow-1000   Infusions:  . sodium chloride 150 mL/hr at 01/14/15 0729  . heparin 900 Units/hr (01/14/15 0015)    Assessment: 74yo male experienced syncopal event, tx'd from [facility] for acute gallstone pancreatitis, also noted w/ AKI, now w/ elevated troponin (apparently negative at [facility]), to begin heparin.  Goal of Therapy:  Heparin level 0.3-0.7 units/ml Monitor platelets by anticoagulation protocol: Yes   Plan:  -Increase heparin gtt to 1050 units/hr -Daily HL, CBC -Monitor for s/sx bleeding  -F/u cardiology plans   Agapito Games, PharmD, BCPS Clinical Pharmacist Pager: [phone] 01/14/2015 11:55 AM

## 2015-01-14 NOTE — Progress Notes (Signed)
01/14/15  Pharmacy-  Heparin 2140   Heparin level 0.38  A/P:  74yo male who experienced a syncopal event and is now on heparin for R/O NSTEMI.  Per cardiology note, this is thought to be a vagal response to pain in the setting of acute gallstone pancreatitis.  Troponins are elevated. Baseline thrombocytopenia noted.  No bleeding per d/w RN.  Heparin level is therapeutic on 1050 units/hr.  No rate adjustment needed at this time.  Will f/u AM labs, continue to monitor for bleeding, and watch pltc.   Marisue Humble, PharmD Clinical Pharmacist [facility]

## 2015-01-14 NOTE — Progress Notes (Signed)
Chaplain responded to spiritual care consult for advanced directive. Chaplain described contents of advanced directive to pt family members while pt receiving echo cardiogram. Page chaplain should family need support.    01/14/15 1100  Clinical Encounter Type  Visited With Family  Visit Type Initial;Spiritual support  Referral From Nurse  Advance Directives (For Healthcare)  Does patient have an advance directive? No  Would patient like information on creating an advanced directive? Yes - Educational materials given  Jiles Harold 01/14/2015 11:36 AM

## 2015-01-14 NOTE — Progress Notes (Addendum)
SUBJECTIVE:  No chest pain.  Abdomen feels ok as well.   OBJECTIVE:   Vitals:   Filed Vitals:   01/14/15 0400 01/14/15 0500 01/14/15 0600 01/14/15 0723  BP: 151/72 139/62 139/64 138/78  Pulse: 82 74 80 82  Temp: 98.2 F (36.8 C)   98.2 F (36.8 C)  TempSrc: Oral   Oral  Resp:  Height:      Weight:  176 lb 9.4 oz (80.1 kg)    SpO2: 96% 95% 96% 95%   I&O's:   Intake/Output Summary (Last 24 hours) at 01/14/15 1610 Last data filed at 01/14/15 0700  Gross per 24 hour  Intake 4448.25 ml  Output    600 ml  Net 3848.25 ml   TELEMETRY: Reviewed telemetry pt in NSR:     PHYSICAL EXAM General: Well developed, well nourished, in no acute distress Head:   Normal cephalic and atramatic  Lungs:   Clear bilaterally to auscultation. Heart:   HRRR S1 S2  No JVD.   Abdomen: abdomen soft and non-tender, no rebound, mildly obese Msk:  Back normal,  Normal strength and tone for age. Extremities:   No edema.   Neuro: Alert and oriented. Psych:  Normal affect, responds appropriately Skin: No rash   LABS: Basic Metabolic Panel:  Recent Labs  96/04/54 0122  NA 139  K 4.0  CL 113*  CO2 19*  GLUCOSE 203*  BUN 24*  CREATININE 1.54*  CALCIUM 7.4*   Liver Function Tests:  Recent Labs  01/14/15 0122  AST 162*  ALT 224*  ALKPHOS 63  BILITOT 2.5*  PROT 5.8*  ALBUMIN 2.9*    Recent Labs  01/13/15 2012  LIPASE 1230*   CBC:  Recent Labs  01/14/15 0122  WBC 16.2*  HGB 16.8  HCT 47.1  MCV 88.2  PLT 120*   Cardiac Enzymes:  Recent Labs  01/13/15 2012 01/14/15 0122  TROPONINI 2.48* 3.94*   BNP: Invalid input(s): POCBNP D-Dimer: No results for input(s): DDIMER in the last 72 hours. Hemoglobin A1C: No results for input(s): HGBA1C in the last 72 hours. Fasting Lipid Panel: No results for input(s): CHOL, HDL, LDLCALC, TRIG, CHOLHDL, LDLDIRECT in the last 72 hours. Thyroid Function Tests: No results for input(s): TSH, T4TOTAL, T3FREE, THYROIDAB  in the last 72 hours.  Invalid input(s): FREET3 Anemia Panel: No results for input(s): VITAMINB12, FOLATE, FERRITIN, TIBC, IRON, RETICCTPCT in the last 72 hours. Coag Panel:   Lab Results  Component Value Date   INR 1.19 01/14/2015    RADIOLOGY: No results found.    ASSESSMENT: NSTEMI, in the setting of hypotension and gallstone pancreatitis; syncope  PLAN:  Continue IV heparin at this time if OK from a GI standpoint.  I agree that this troponin elevation may be more related to demand ischemia rather than plaque rupture MI.  Plan IV heparin for 48 hours.  At some point, he will need an ischemic w/u but would not cath now as we could not commit him to dual antiplatelet therapy at this time with pending GI issues.    Will check echo to eval LV function and afor any focal RWMA.  If no RWMA, that would go along with demand ischemia.  Family h/o CAD.  HTN: BPs elevated at home at times.  COntinue antihypertensives as needed at this time.  Of note, patient is active at home prior to having the gallstone issue.  He walks hills without any problems.  He is not  reporting any preexisting cardiac sx.  Syncope: likely vagal from pain.  No significant arrhythmia on tele at this time.   Mild renal insufficiency.  I discussed the plan with the patient and his family at length.  Corky Crafts, MD  01/14/2015  8:12 AM

## 2015-01-14 NOTE — Progress Notes (Signed)
  Echocardiogram 2D Echocardiogram with Definity has been performed.  Evalynn Hankins 01/14/2015, 12:01 PM

## 2015-01-15 ENCOUNTER — Inpatient Hospital Stay (HOSPITAL_COMMUNITY): Payer: Medicare Other

## 2015-01-15 DIAGNOSIS — K851 Biliary acute pancreatitis without necrosis or infection: Secondary | ICD-10-CM | POA: Diagnosis present

## 2015-01-15 DIAGNOSIS — K8050 Calculus of bile duct without cholangitis or cholecystitis without obstruction: Secondary | ICD-10-CM | POA: Insufficient documentation

## 2015-01-15 DIAGNOSIS — N179 Acute kidney failure, unspecified: Secondary | ICD-10-CM | POA: Insufficient documentation

## 2015-01-15 DIAGNOSIS — I429 Cardiomyopathy, unspecified: Secondary | ICD-10-CM

## 2015-01-15 DIAGNOSIS — I5021 Acute systolic (congestive) heart failure: Secondary | ICD-10-CM | POA: Diagnosis present

## 2015-01-15 DIAGNOSIS — I214 Non-ST elevation (NSTEMI) myocardial infarction: Secondary | ICD-10-CM | POA: Diagnosis present

## 2015-01-15 DIAGNOSIS — R55 Syncope and collapse: Secondary | ICD-10-CM | POA: Insufficient documentation

## 2015-01-15 DIAGNOSIS — K567 Ileus, unspecified: Secondary | ICD-10-CM | POA: Diagnosis present

## 2015-01-15 DIAGNOSIS — I10 Essential (primary) hypertension: Secondary | ICD-10-CM | POA: Diagnosis present

## 2015-01-15 DIAGNOSIS — D72829 Elevated white blood cell count, unspecified: Secondary | ICD-10-CM | POA: Insufficient documentation

## 2015-01-15 DIAGNOSIS — I9589 Other hypotension: Secondary | ICD-10-CM | POA: Insufficient documentation

## 2015-01-15 LAB — COMPREHENSIVE METABOLIC PANEL
ALT: 106 U/L — ABNORMAL HIGH (ref 17–63)
AST: 67 U/L — ABNORMAL HIGH (ref 15–41)
Albumin: 2.5 g/dL — ABNORMAL LOW (ref 3.5–5.0)
Alkaline Phosphatase: 42 U/L (ref 38–126)
Anion gap: 5 (ref 5–15)
BUN: 30 mg/dL — ABNORMAL HIGH (ref 6–20)
CO2: 19 mmol/L — ABNORMAL LOW (ref 22–32)
CREATININE: 1.65 mg/dL — AB (ref 0.61–1.24)
Calcium: 5.8 mg/dL — CL (ref 8.9–10.3)
Chloride: 113 mmol/L — ABNORMAL HIGH (ref 101–111)
GFR, EST AFRICAN AMERICAN: 46 mL/min — AB (ref >60)
GFR, EST NON AFRICAN AMERICAN: 40 mL/min — AB (ref >60)
GLUCOSE: 148 mg/dL — AB (ref 70–99)
POTASSIUM: 4.1 mmol/L (ref 3.5–5.1)
Sodium: 137 mmol/L (ref 135–145)
TOTAL PROTEIN: 5.2 g/dL — AB (ref 6.5–8.1)
Total Bilirubin: 2.7 mg/dL — ABNORMAL HIGH (ref 0.3–1.2)

## 2015-01-15 LAB — LIPID PANEL
Cholesterol: 104 mg/dL (ref 0–200)
HDL: 26 mg/dL — ABNORMAL LOW (ref >40)
LDL Cholesterol: 64 mg/dL (ref 0–99)
TRIGLYCERIDES: 71 mg/dL (ref <150)
Total CHOL/HDL Ratio: 4 RATIO
VLDL: 14 mg/dL (ref 0–40)

## 2015-01-15 LAB — CBC
HCT: 46 % (ref 39.0–52.0)
HEMOGLOBIN: 15.6 g/dL (ref 13.0–17.0)
MCH: 30.4 pg (ref 26.0–34.0)
MCHC: 33.9 g/dL (ref 30.0–36.0)
MCV: 89.7 fL (ref 78.0–100.0)
PLATELETS: 83 10*3/uL — AB (ref 150–400)
RBC: 5.13 MIL/uL (ref 4.22–5.81)
RDW: 13.9 % (ref 11.5–15.5)
WBC: 18.2 10*3/uL — AB (ref 4.0–10.5)

## 2015-01-15 LAB — HEPARIN LEVEL (UNFRACTIONATED)
Heparin Unfractionated: 0.27 IU/mL — ABNORMAL LOW (ref 0.30–0.70)
Heparin Unfractionated: 0.23 IU/mL — ABNORMAL LOW (ref 0.30–0.70)

## 2015-01-15 LAB — BILIRUBIN, TOTAL: Total Bilirubin: 2.6 mg/dL — ABNORMAL HIGH (ref 0.3–1.2)

## 2015-01-15 LAB — GLUCOSE, CAPILLARY
GLUCOSE-CAPILLARY: 131 mg/dL — AB (ref 70–99)
Glucose-Capillary: 123 mg/dL — ABNORMAL HIGH (ref 70–99)
Glucose-Capillary: 156 mg/dL — ABNORMAL HIGH (ref 70–99)
Glucose-Capillary: 136 mg/dL — ABNORMAL HIGH (ref 70–99)

## 2015-01-15 LAB — MAGNESIUM: Magnesium: 1.4 mg/dL — ABNORMAL LOW (ref 1.7–2.4)

## 2015-01-15 LAB — AMYLASE: AMYLASE: 978 U/L — AB (ref 28–100)

## 2015-01-15 LAB — TROPONIN I: TROPONIN I: 6.67 ng/mL — AB (ref <0.031)

## 2015-01-15 LAB — LIPASE, BLOOD: Lipase: 474 U/L — ABNORMAL HIGH (ref 22–51)

## 2015-01-15 LAB — PHOSPHORUS: PHOSPHORUS: 1.7 mg/dL — AB (ref 2.5–4.6)

## 2015-01-15 MED ORDER — SODIUM CHLORIDE 0.9 % IV SOLN
INTRAVENOUS | Status: DC
  Administered 2015-01-16: 06:00:00 via INTRAVENOUS

## 2015-01-15 MED ORDER — HYDROMORPHONE HCL 1 MG/ML IJ SOLN
2.0000 mg | INTRAMUSCULAR | Status: DC | PRN
  Administered 2015-01-15 – 2015-01-18 (×9): 2 mg via INTRAVENOUS
  Filled 2015-01-15 (×9): qty 2

## 2015-01-15 MED ORDER — HEART ATTACK BOUNCING BOOK
Freq: Once | Status: AC
  Administered 2015-01-15: 07:00:00
  Filled 2015-01-15: qty 1

## 2015-01-15 MED ORDER — BISACODYL 10 MG RE SUPP
10.0000 mg | Freq: Once | RECTAL | Status: AC
  Administered 2015-01-15: 10 mg via RECTAL
  Filled 2015-01-15: qty 1

## 2015-01-15 MED ORDER — SODIUM CHLORIDE 0.9 % IV SOLN: 250.0000 mL | INTRAVENOUS | Status: DC | PRN

## 2015-01-15 MED ORDER — CARVEDILOL 3.125 MG PO TABS
3.1250 mg | ORAL_TABLET | Freq: Two times a day (BID) | ORAL | Status: DC
  Administered 2015-01-15 – 2015-01-18 (×6): 3.125 mg via ORAL
  Filled 2015-01-15 (×8): qty 1

## 2015-01-15 MED ORDER — ASPIRIN 81 MG PO CHEW
81.0000 mg | CHEWABLE_TABLET | ORAL | Status: AC
  Administered 2015-01-16: 81 mg via ORAL
  Filled 2015-01-15: qty 1

## 2015-01-15 MED ORDER — SODIUM CHLORIDE 0.9 % IJ SOLN
3.0000 mL | Freq: Two times a day (BID) | INTRAMUSCULAR | Status: DC
  Administered 2015-01-16 (×2): 3 mL via INTRAVENOUS

## 2015-01-15 MED ORDER — SODIUM CHLORIDE 0.9 % IJ SOLN: 3.0000 mL | INTRAMUSCULAR | Status: DC | PRN

## 2015-01-15 NOTE — Progress Notes (Signed)
[place] TEAM 1 - Stepdown/ICU TEAM Progress Note  [name] [name] KDX:[id number] DOB: [birth year] DOA: 01/13/2015 PCP: Monico Blitz, MD  Admit HPI / Brief Narrative: 74 yo WM PMHx HTN and DM Type 2 Presented to [facility] ED after a syncopal episode. He had been having persistent epigastric abdominal pain with associated nausea. The pain progressively got worse, and up to an 8/10 when he passed out. EMS noted he was bradycardic in the 30s with SBP documented in the mid 60s.He had previously been told he has a gallbladder full of stones but had been asymptomatic w/ no h/o pancreatitis.   In ED at [place] showed CBD stone and pancreatitis. Lip was >300 (no actual level). AST/ALT 247/212 - tbili 1.9 - alk phos normal. Lactic acid level 2.2 . Creat 1.7. The pt was transferred to [facility].    HPI/Subjective: 5/10 A/O 4, states negative abdominal pain, negative N/V (has only been consuming small amount Jell-O ice chips), negative CP, negative SOB  Assessment/Plan:  Acute Gallstone pancreatitis  -Plan was initially for ERCP, however secondary to patient's possible NSTEMI will proceed today with MRCP instead. -KUB shows possible small ileus : Counseled family would not recommend NG tube at this point unless patient began to experience significant abdominal pain, N/V.  Leukocytosis -Patient afebrile, negative bands or left shift; most likely reactive in nature, although cannot rule out cholecystitis; obtain bilirubin, amylase -Continue to trend and monitor closely Would hold off on antibiotics at this time.  Syncope and collapse -Initially felt to be Vagal due to pain - continue to follow on telemetry  Bradycardia  -Initially felt to be Vagal; resolved continue to monitor closely  NSTEMI -IV heparin for 48 hours per Cards -Per cardiology patient currently not a cardiac cath candidate, secondary to not being able to be on dual antiplatelet therapy.  -Positive wall motion  abnormality (WMA) see cardiac cath results will -Strict in and out + 6.1 L -Daily weight  Acute systolic congestive heart failure -No ACE inhibitor due to acute kidney injury  - as patient is not acutely decompensated and with elevated troponins with suspicion of coronary artery disease will initiate Coreg 3.125 mg  BID and titrate as able  HTN/Hypotension  -Patient now hypertensive -See systolic CHF  Acute kidney injury?  vs   chronic kidney failure -Unable to determine patient's baseline kidney function through review of EMR.  -Judicious volume resuscitation; Cr trending up.  DM Type 2 uncontrolled -5/8 hemoglobin A1c = 7.5  -Reasonably well controlled at the present time   Code Status: FULL Family Communication: family present at time of exam Disposition Plan: - timing of ERCP per GI - timing of cardiac cath per cardiology    Consultants: GI [facility] Cardiology    Procedure/Significant Events: TTE - 5/9 - EF 30-40% - akinesis of basal-midinferior myocardium - severe hypokinesis lateral myocardium - grade 1 DD 5/10 KUB;Mild dilatation of small bowel likely representing a small-bowel ileus.   Culture   Antibiotics:   DVT prophylaxis: IV heparin    Devices    LINES / TUBES:      Continuous Infusions: . sodium chloride 75 mL/hr at 01/15/15 1135  . heparin 1,250 Units/hr (01/15/15 1932)    Objective: VITAL SIGNS: Temp: 98.5 F (36.9 C) (05/10 1952) Temp Source: Axillary (05/10 1952) BP: 145/69 mmHg (05/10 1952) Pulse Rate: 96 (05/10 1952) SPO2; FIO2:   Intake/Output Summary (Last 24 hours) at 01/15/15 2149 Last data filed at 01/15/15 1932  Gross per  24 hour  Intake 1973.2 ml  Output   1000 ml  Net  973.2 ml     Exam: General: A/O 4, NAD, No acute respiratory distress Lungs: Clear to auscultation bilaterally without wheezes or crackles Cardiovascular: Regular rate and rhythm without murmur gallop or rub normal S1 and  S2 Abdomen: Mild generalized tenderness, nondistended, soft, bowel sounds positive, no rebound, no ascites, no appreciable mass Extremities: No significant cyanosis, clubbing, or edema bilateral lower extremities  Data Reviewed: Basic Metabolic Panel:  Recent Labs Lab 01/14/15 0122 01/15/15 0159  NA 139 137  K 4.0 4.1  CL 113* 113*  CO2 19* 19*  GLUCOSE 203* 148*  BUN 24* 30*  CREATININE 1.54* 1.65*  CALCIUM 7.4* 5.8*  MG  --  1.4*  PHOS  --  1.7*   Liver Function Tests:  Recent Labs Lab 01/14/15 0122 01/15/15 0159 01/15/15 1130  AST 162* 67*  --   ALT 224* 106*  --   ALKPHOS 63 42  --   BILITOT 2.5* 2.7* 2.6*  PROT 5.8* 5.2*  --   ALBUMIN 2.9* 2.5*  --     Recent Labs Lab 01/13/15 2012 01/15/15 0159 01/15/15 1130  LIPASE 1230* 474*  --   AMYLASE  --   --  978*   No results for input(s): AMMONIA in the last 168 hours. CBC:  Recent Labs Lab 01/14/15 0122 01/14/15 0816 01/15/15 0159  WBC 16.2* 17.2* 18.2*  HGB 16.8 16.8 15.6  HCT 47.1 47.6 46.0  MCV 88.2 87.8 89.7  PLT 120* 119* 83*   Cardiac Enzymes:  Recent Labs Lab 01/13/15 2012 01/14/15 0122 01/14/15 0816 01/15/15 0159  CKTOTAL  --   --  437*  --   CKMB  --   --  40.7*  --   TROPONINI 2.48* 3.94* 6.57*  6.48* 6.67*   BNP (last 3 results) No results for input(s): BNP in the last 8760 hours.  ProBNP (last 3 results) No results for input(s): PROBNP in the last 8760 hours.  CBG:  Recent Labs Lab 01/14/15 2350 01/15/15 0351 01/15/15 0821 01/15/15 1240 01/15/15 2047  GLUCAP 153* 123* 131* 156* 136*    Recent Results (from the past 240 hour(s))  MRSA PCR Screening     Status: None   Collection Time: 01/13/15  6:33 PM  Result Value Ref Range Status   MRSA by PCR NEGATIVE NEGATIVE Final    Comment:        The GeneXpert MRSA Assay (FDA approved for NASAL specimens only), is one component of a comprehensive MRSA colonization surveillance program. It is not intended to  diagnose MRSA infection nor to guide or monitor treatment for MRSA infections.      Studies:  Recent x-ray studies have been reviewed in detail by the Attending Physician  Scheduled Meds:  Scheduled Meds: . antiseptic oral rinse  7 mL Mouth Rinse q12n4p  . aspirin  81 mg Oral Daily  . carvedilol  3.125 mg Oral BID WC  . chlorhexidine  15 mL Mouth Rinse BID  . insulin aspart  0-9 Units Subcutaneous 6 times per day  . latanoprost  1 drop Both Eyes QHS    Time spent on care of this patient: 40 mins   Gloriajean Okun, Geraldo Docker , MD  [facility] Office  [phone] Pager [phone]  On-Call/Text Page:      [email or web address]      password [email or web address]  If 7PM-7AM, please contact night-coverage [email or web address] Password [facility] 01/15/2015, 9:49 PM  LOS: 2 days   Care during the described time interval was provided by me .  I have reviewed this patient's available data, including medical history, events of note, physical examination, radiology studies and test results as part of my evaluation  Dia Crawford, MD [phone] Pager

## 2015-01-15 NOTE — Progress Notes (Signed)
Notified Hiram Comber, MD, cards fellow, about pt Troponin of 6.67. MD called back. Pt is stable, asleep in bed. Vital signs stable. Reports no chest pain or SOB.   Caralee Ates, RN

## 2015-01-15 NOTE — Progress Notes (Signed)
ANTICOAGULATION CONSULT NOTE - Follow Up Consult  Pharmacy Consult for heparin Indication: NSTEMI  Labs:  Recent Labs  01/13/15 2012  01/14/15 0122 01/14/15 0816 01/14/15 1935 01/15/15 0159  HGB  --   < > 16.8 16.8  --  15.6  HCT  --   --  47.1 47.6  --  46.0  PLT  --   --  120* 119*  --  83*  LABPROT  --   --  15.2  --   --   --   INR  --   --  1.19  --   --   --   HEPARINUNFRC  --   --   --  0.23* 0.38 0.27*  CREATININE  --   --  1.54*  --   --   --   CKTOTAL  --   --   --  437*  --   --   CKMB  --   --   --  40.7*  --   --   TROPONINI 2.48*  --  3.94* 6.57*  6.48*  --   --   < > = values in this interval not displayed.   Assessment: 74yo male now slightly subtherapeutic on heparin after one level at low end of goal; noted Plt down to 83 from 120 and Hgb down 1 point, per RN no evidence of bleeding and pt is 6L positive on fluids, so may be some dilution.  Goal of Therapy:  Heparin level 0.3-0.7 units/ml   Plan:  Will increase heparin gtt slightly to 1100 units/hr and check level in 8hr and continue to monitor CBC.  Vernard Gambles, PharmD, BCPS  01/15/2015,2:52 AM

## 2015-01-15 NOTE — Progress Notes (Signed)
Utilization Review Completed.  

## 2015-01-15 NOTE — Progress Notes (Signed)
SUBJECTIVE:  No chest pain.  Abdomen feels tighter today. Feels a little worse overall.   OBJECTIVE:   Vitals:   Filed Vitals:   01/15/15 0600 01/15/15 0757 01/15/15 0800 01/15/15 1000  BP: 117/67 142/73 134/66 154/74  Pulse: 92 97 95 95  Temp:  98.3 F (36.8 C)    TempSrc:  Oral    Resp: 20 24 20 24   Height:      Weight:      SpO2: 97% 96% 96% 95%   I&O's:    Intake/Output Summary (Last 24 hours) at 01/15/15 1048 Last data filed at 01/15/15 1021  Gross per 24 hour  Intake 3984.58 ml  Output   1550 ml  Net 2434.58 ml   TELEMETRY: Reviewed telemetry pt in NSR:     PHYSICAL EXAM General: Well developed, well nourished, in no acute distress Head:   Normal cephalic and atramatic  Lungs:   Clear bilaterally to auscultation. Heart:   HRRR S1 S2  No JVD.   Abdomen: abdomen soft and mildly tender, no rebound, mildly obese Msk:  Back normal,  Normal strength and tone for age. Extremities:   Swollen fingers.   Neuro: Alert and oriented. Psych:  Normal affect, responds appropriately Skin: No rash; redness of the face and upper chest   LABS: Basic Metabolic Panel:  Recent Labs  82/95/62 0122 01/15/15 0159  NA 139 137  K 4.0 4.1  CL 113* 113*  CO2 19* 19*  GLUCOSE 203* 148*  BUN 24* 30*  CREATININE 1.54* 1.65*  CALCIUM 7.4* 5.8*  MG  --  1.4*  PHOS  --  1.7*   Liver Function Tests:  Recent Labs  01/14/15 0122 01/15/15 0159  AST 162* 67*  ALT 224* 106*  ALKPHOS 63 42  BILITOT 2.5* 2.7*  PROT 5.8* 5.2*  ALBUMIN 2.9* 2.5*    Recent Labs  01/13/15 2012 01/15/15 0159  LIPASE 1230* 474*   CBC:  Recent Labs  01/14/15 0816 01/15/15 0159  WBC 17.2* 18.2*  HGB 16.8 15.6  HCT 47.6 46.0  MCV 87.8 89.7  PLT 119* 83*   Cardiac Enzymes:  Recent Labs  01/14/15 0122 01/14/15 0816 01/15/15 0159  CKTOTAL  --  437*  --   CKMB  --  40.7*  --   TROPONINI 3.94* 6.57*  6.48* 6.67*   BNP: Invalid input(s): POCBNP D-Dimer: No results for  input(s): DDIMER in the last 72 hours. Hemoglobin A1C:  Recent Labs  01/13/15 2012  HGBA1C 7.5*   Fasting Lipid Panel:  Recent Labs  01/15/15 0159  CHOL 104  HDL 26*  LDLCALC 64  TRIG 71  CHOLHDL 4.0   Thyroid Function Tests: No results for input(s): TSH, T4TOTAL, T3FREE, THYROIDAB in the last 72 hours.  Invalid input(s): FREET3 Anemia Panel: No results for input(s): VITAMINB12, FOLATE, FERRITIN, TIBC, IRON, RETICCTPCT in the last 72 hours. Coag Panel:   Lab Results  Component Value Date   INR 1.19 01/14/2015    RADIOLOGY: Dg Abd 1 View  01/15/2015   CLINICAL DATA:  Ileus  EXAM: ABDOMEN - 1 VIEW  COMPARISON:  None.  FINDINGS: Scattered large and small bowel gas is noted. Mildly dilated loops of small bowel are to in the mid abdomen. No free air is noted. No definitive obstructive changes are seen.  IMPRESSION: Mild dilatation of small bowel likely representing a small-bowel ileus. Correlation with the physical exam is recommended.   Electronically Signed   By: Eulah Pont.D.  On: 01/15/2015 09:18      ASSESSMENT: NSTEMI, in the setting of hypotension and gallstone pancreatitis; syncope  PLAN:  Continue IV heparin at this time.  Given RWMA, troponin elevation may be related to plaque rupture MI.  Plan IV heparin for 48 hours. Will plan for diagnostic cath tomorrow if renal function stabilizes.    Family h/o CAD.  HTN: BPs elevated at home at times.  COntinue antihypertensives as needed at this time.  Add beta blocker.  Of note, patient is active at home prior to having the gallstone issue.  He walks hills without any problems.  He is not reporting any preexisting cardiac sx.  Syncope: likely vagal from pain.  No significant arrhythmia on tele at this time.   Mild renal insufficiency.  Cr slightly increased this AM.  Hydrate but watch for fluid overload in the setting of cardiomyopathy.  I discussed the plan with the patient and his family at length.  Precath  orders placed.  Corky Crafts, MD  01/15/2015  10:48 AM

## 2015-01-15 NOTE — Progress Notes (Signed)
ANTICOAGULATION CONSULT NOTE  Pharmacy Consult for heparin Indication: chest pain/ACS  No Known Allergies  Patient Measurements: Height: 6' (182.9 cm) Weight: 184 lb 1.4 oz (83.5 kg) IBW/kg (Calculated) : 77.6  Vital Signs: Temp: 98.4 F (36.9 C) (05/10 1149) Temp Source: Oral (05/10 1149) BP: 157/69 mmHg (05/10 1149) Pulse Rate: 94 (05/10 1149)  Labs:  Recent Labs  01/14/15 0122  01/14/15 0816 01/14/15 1935 01/15/15 0159 01/15/15 1100  HGB 16.8  --  16.8  --  15.6  --   HCT 47.1  --  47.6  --  46.0  --   PLT 120*  --  119*  --  83*  --   LABPROT 15.2  --   --   --   --   --   INR 1.19  --   --   --   --   --   HEPARINUNFRC  --   < > 0.23* 0.38 0.27* 0.23*  CREATININE 1.54*  --   --   --  1.65*  --   CKTOTAL  --   --  437*  --   --   --   CKMB  --   --  40.7*  --   --   --   TROPONINI 3.94*  --  6.57*  6.48*  --  6.67*  --   < > = values in this interval not displayed.    Medical History: Past Medical History  Diagnosis Date  . Hypertension   . Diabetes mellitus type 2 in obese   . Cholelithiasis     Medications:  Prescriptions prior to admission  Medication Sig Dispense Refill Last Dose  . atorvastatin (LIPITOR) 10 MG tablet Take 10 mg by mouth daily.   01/12/2015 at Unknown time  . glipiZIDE (GLUCOTROL XL) 10 MG 24 hr tablet Take 10 mg by mouth 2 (two) times daily.   01/12/2015 at Unknown time  . latanoprost (XALATAN) 0.005 % ophthalmic solution Place 1 drop into both eyes at bedtime.   01/12/2015 at Unknown time  . lisinopril-hydrochlorothiazide (PRINZIDE,ZESTORETIC) 20-12.5 MG per tablet Take 1 tablet by mouth daily.   01/13/2015 at Unknown time  . sildenafil (VIAGRA) 100 MG tablet Take 100 mg by mouth daily as needed for erectile dysfunction.   unknown  . sitaGLIPtin (JANUVIA) 100 MG tablet Take 100 mg by mouth daily.   01/12/2015 at Unknown time  . vardenafil (LEVITRA) 20 MG tablet Take 20 mg by mouth daily as needed for erectile dysfunction.   unknown    Scheduled:  . antiseptic oral rinse  7 mL Mouth Rinse q12n4p  . aspirin  81 mg Oral Daily  . carvedilol  3.125 mg Oral BID WC  . chlorhexidine  15 mL Mouth Rinse BID  . insulin aspart  0-9 Units Subcutaneous 6 times per day  . latanoprost  1 drop Both Eyes QHS   Infusions:  . sodium chloride 75 mL/hr at 01/15/15 1135  . heparin 1,100 Units/hr (01/15/15 0300)    Assessment: 74yo male experienced syncopal event, tx'd from [facility] for acute gallstone pancreatitis, also noted w/ AKI, now w/ elevated troponin (apparently negative at [facility]), on IV heparin with fluctuating heparin levels. Most recent heparin level is SUBtherapeutic.   Goal of Therapy:  Heparin level 0.3-0.7 units/ml Monitor platelets by anticoagulation protocol: Yes   Plan:  -Increase heparin gtt to 1250 units/hr -Daily HL, CBC -Monitor for s/sx bleeding  -F/u cardiology plans -Check level this evening  Agapito Games, PharmD, BCPS Clinical Pharmacist Pager: [phone] 01/15/2015 1:41 PM

## 2015-01-15 NOTE — Progress Notes (Signed)
CRITICAL VALUE ALERT  Critical value received:  Ca+ of 5.8  Date of notification:  01/15/15  Time of notification:  0300  Critical value read back:Yes.    Nurse who received alert:  Caralee Ates, RN  MD notified (1st page):  Craige Cotta, NP  Time of first page:  310  MD notified (2nd page): Craige Cotta, NP  Time of second page: 350  Responding MD:  n/a  Time MD responded:  n/a

## 2015-01-15 NOTE — Progress Notes (Signed)
Daily Rounding Note  01/15/2015, 8:05 AM  LOS: 2 days   SUBJECTIVE:       Received 32 mg Morphine yesterday, 8 mg so far today.  However he says the morphine does not really help.  Pain at 5 to 6/10.  Belly is tighter.  No stools. Little flatus.   I/Os: +2.2 liters.  Urine output of 1450 ml.  IVF at 75 ml/hour. Tmax 100  OBJECTIVE:         Vital signs in last 24 hours:    Temp:  [98.1 F (36.7 C)-100 F (37.8 C)] 98.3 F (36.8 C) (05/10 0757) Pulse Rate:  [84-102] 97 (05/10 0757) Resp:  [15-25] 24 (05/10 0757) BP: (108-168)/(58-77) 142/73 mmHg (05/10 0757) SpO2:  [95 %-99 %] 96 % (05/10 0757) Weight:  [184 lb 1.4 oz (83.5 kg)] 184 lb 1.4 oz (83.5 kg) (05/10 0500) Last BM Date: 01/13/15 Filed Weights   01/13/15 2300 01/14/15 0500 01/15/15 0500  Weight: 173 lb 1 oz (78.5 kg) 176 lb 9.4 oz (80.1 kg) 184 lb 1.4 oz (83.5 kg)   General: not toxic or acutely ill but is diaphoretic.  Comfortable and alert.   Heart: RRR.   Chest: clear bil.  No labored breathing, no cough Abdomen: tense, minimal tenderness.  BS tinkling.   Extremities: no CCE.  Slight edema in right UE.  Neuro/Psych:  Oriented x 3.  No limb weakness. No tremor.   Intake/Output from previous day: 05/09 0701 - 05/10 0700 In: 3726.6 [P.O.:770; I.V.:2956.6] Out: 1450 [Urine:1450]      Lab Results:  Recent Labs  01/14/15 0122 01/14/15 0816 01/15/15 0159  WBC 16.2* 17.2* 18.2*  HGB 16.8 16.8 15.6  HCT 47.1 47.6 46.0  PLT 120* 119* 83*   BMET  Recent Labs  01/14/15 0122 01/15/15 0159  NA 139 137  K 4.0 4.1  CL 113* 113*  CO2 19* 19*  GLUCOSE 203* 148*  BUN 24* 30*  CREATININE 1.54* 1.65*  CALCIUM 7.4* 5.8*   LFT  Recent Labs  01/14/15 0122 01/15/15 0159  PROT 5.8* 5.2*  ALBUMIN 2.9* 2.5*  AST 162* 67*  ALT 224* 106*  ALKPHOS 63 42  BILITOT 2.5* 2.7*   PT/INR  Recent Labs  01/14/15 0122  LABPROT 15.2  INR 1.19    Scheduled Meds: . antiseptic oral rinse  7 mL Mouth Rinse q12n4p  . aspirin  81 mg Oral Daily  . chlorhexidine  15 mL Mouth Rinse BID  . insulin aspart  0-9 Units Subcutaneous 6 times per day  . latanoprost  1 drop Both Eyes QHS  . pneumococcal 23 valent vaccine  0.5 mL Intramuscular Tomorrow-1000   Continuous Infusions: . sodium chloride 75 mL/hr at 01/14/15 2148  . heparin 1,100 Units/hr (01/15/15 0300)   PRN Meds:.hydrALAZINE, morphine injection, ondansetron **OR** ondansetron (ZOFRAN) IV   ASSESMENT:   * Biliary pancreatitis. LFTs and lipase continue to improve.  * Choledocholithiasis.   * Non STEMI. Cardiology following. Demand ischemia is felt to be culprit. On IV heparin x 48 hours. "At some point, he will need an ischemic w/u but would not cath now as we could not commit him to dual antiplatelet therapy at this time with pending GI issues"per Dr Eldridge Dace. No sxs pre illness worrisome for CAD.   *  Newly diagnosed systolic CHF.  EF 35 - 40%. Akinesis/hypokinesis.  Grade 1 diastolic dysfunction. This makes hydration problematic. Currently not SOB.   *  Cholelithiasis without obvious cholecystitis.  *  Hypocalcemia.  Corrected calcium is 7.   *  Thrombocytopenia.  Present at admit, ? 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  * Type 2 DM  * Acute renal failure.  Improved.    PLAN   *  Ideally would like to up his IVF rate but reluctant to do this with his heart failure.   *  ?  Trial of Dilaudid for pain mgt?   *  Portable KUB, assess for ileus.  MRCP ordered to assess for retained CBD stones.     Jennye Moccasin  01/15/2015, 8:05 AM Pager: [phone]  [facility] Attending  I have also seen and assessed the patient and agree with the advanced practitioner's assessment and plan. He has a complicated situation given MI and pancreatitis. He seems stable overall but remains critically ill I think.  He has:  Acute gallstone  pancreatitis Choledocholithiasis Ileus NSTEMI with regional wall motion decrease Hypocalcemia Acute Kidney Injury  1) D/w Dr. Eldridge Dace - work-up heart takes priority and treatment also 2) Will get MRCP - help [place] understand bile duct stone status 3) No anesthesia/ERCP until cardiac status maximized, unless emergent need 4) bisacodyl suppository for ileus 5) think clear liquids ok 6) needs hydration as much as possible - Ca and pancreatitis 7) await ionized Ca 8) Change MSO4 to hydromorphone  Iva Boop, MD, [facility] [phone] (pager) 01/15/2015 2:55 PM

## 2015-01-15 NOTE — Progress Notes (Signed)
Spoke with Vernard Gambles, [facility] about pt platelet count. Orders given.    Ceniya Fowers. RN

## 2015-01-16 ENCOUNTER — Encounter (HOSPITAL_COMMUNITY)
Admission: AD | Disposition: A | Discharge status: Home Health | Payer: Self-pay | Source: Other Acute Inpatient Hospital | Attending: Internal Medicine

## 2015-01-16 ENCOUNTER — Inpatient Hospital Stay (HOSPITAL_COMMUNITY): Payer: Medicare Other

## 2015-01-16 LAB — HEPARIN LEVEL (UNFRACTIONATED)
Heparin Unfractionated: 0.22 IU/mL — ABNORMAL LOW (ref 0.30–0.70)
Heparin Unfractionated: 0.14 IU/mL — ABNORMAL LOW (ref 0.30–0.70)

## 2015-01-16 LAB — HEPATIC FUNCTION PANEL
ALT: 56 U/L (ref 17–63)
AST: 35 U/L (ref 15–41)
Albumin: 2.1 g/dL — ABNORMAL LOW (ref 3.5–5.0)
Alkaline Phosphatase: 45 U/L (ref 38–126)
BILIRUBIN DIRECT: 0.7 mg/dL — AB (ref 0.1–0.5)
Indirect Bilirubin: 1.8 mg/dL — ABNORMAL HIGH (ref 0.3–0.9)
TOTAL PROTEIN: 5 g/dL — AB (ref 6.5–8.1)
Total Bilirubin: 2.5 mg/dL — ABNORMAL HIGH (ref 0.3–1.2)

## 2015-01-16 LAB — CBC
HEMATOCRIT: 41.5 % (ref 39.0–52.0)
HEMOGLOBIN: 13.9 g/dL (ref 13.0–17.0)
MCH: 30.1 pg (ref 26.0–34.0)
MCHC: 33.5 g/dL (ref 30.0–36.0)
MCV: 89.8 fL (ref 78.0–100.0)
Platelets: 69 10*3/uL — ABNORMAL LOW (ref 150–400)
RBC: 4.62 MIL/uL (ref 4.22–5.81)
RDW: 14.1 % (ref 11.5–15.5)
WBC: 14.4 10*3/uL — ABNORMAL HIGH (ref 4.0–10.5)

## 2015-01-16 LAB — GLUCOSE, CAPILLARY
GLUCOSE-CAPILLARY: 147 mg/dL — AB (ref 70–99)
Glucose-Capillary: 148 mg/dL — ABNORMAL HIGH (ref 70–99)
Glucose-Capillary: 150 mg/dL — ABNORMAL HIGH (ref 70–99)
Glucose-Capillary: 160 mg/dL — ABNORMAL HIGH (ref 70–99)
Glucose-Capillary: 143 mg/dL — ABNORMAL HIGH (ref 70–99)
Glucose-Capillary: 186 mg/dL — ABNORMAL HIGH (ref 70–99)

## 2015-01-16 LAB — BASIC METABOLIC PANEL
Anion gap: 11 (ref 5–15)
BUN: 41 mg/dL — AB (ref 6–20)
CHLORIDE: 111 mmol/L (ref 101–111)
CO2: 16 mmol/L — ABNORMAL LOW (ref 22–32)
Calcium: 5.5 mg/dL — CL (ref 8.9–10.3)
Creatinine, Ser: 1.92 mg/dL — ABNORMAL HIGH (ref 0.61–1.24)
GFR calc Af Amer: 39 mL/min — ABNORMAL LOW (ref >60)
GFR calc non Af Amer: 33 mL/min — ABNORMAL LOW (ref >60)
GLUCOSE: 165 mg/dL — AB (ref 70–99)
POTASSIUM: 3.8 mmol/L (ref 3.5–5.1)
SODIUM: 138 mmol/L (ref 135–145)

## 2015-01-16 SURGERY — LEFT HEART CATH AND CORONARY ANGIOGRAPHY

## 2015-01-16 MED ORDER — ADENOSINE 6 MG/2ML IV SOLN
INTRAVENOUS | Status: AC
  Administered 2015-01-16: 12 mg via INTRAVENOUS
  Filled 2015-01-16: qty 4

## 2015-01-16 MED ORDER — ADENOSINE 6 MG/2ML IV SOLN
INTRAVENOUS | Status: AC
  Administered 2015-01-16: 02:00:00 via INTRAVENOUS
  Filled 2015-01-16: qty 2

## 2015-01-16 NOTE — Progress Notes (Signed)
[place] TEAM 1 - Stepdown/ICU TEAM Progress Note  [name] [name] KZS:[id number] DOB: 12-Oct-1940 DOA: 01/13/2015 PCP: [name] Blitz, MD  Admit HPI / Brief Narrative: 74 yo male h/o HTN and DM who presented to [facility] ED after a syncopal episode.  He had been having persistent epigastric abdominal pain with associated nausea. The pain progressively got worse, and up to an 8/10 when he passed out. EMS noted he was bradycardic in the 30s with sbp documented in the mid 60s.He had previously been told he has a gallbladder full of stones but had been asymptomatic w/ no h/o pancreatitis.   In ED at [place] showed cbd stone and pancreatitis. Lip was >300 (no actual level).  AST/ALT 247/212 -  tbili 1.9 - alk phos normal. Lactic acid level 2.2 . Creat 1.7.  The pt was transferred to [facility].   HPI/Subjective: The pt experienced tachycardia last night, attended to by Cardiology.  He is presently resting comfortably and has no complaints apart from being nothing by mouth.  He denies chest pain shortness of breath fevers chills nausea vomiting or abdominal pain that he does feel that his abdomen is slightly "tight" across both upper quadrants.    Assessment/Plan:  Acute Gallstone pancreatitis  MRCP suggests possible 4 mm stone within the common bile duct (though images not ideal ) - GI is following - clinically the patient appears to be slowly improving from this standpoint - recheck LFTs today and in a.m. - continue with clear liquids only   Syncope  Initially felt to be Vagal due to pain - no recurrence since admission   Bradycardia  Initially felt to be Vagal - has not recurred while inpatient   SVT  Responded to adenosine given at bedside earlier this morning with restoration of sinus rhythm - cardiology following  NSTEMI IV heparin per Cards - WMA noted on TTE -  cardiac cath was planned for today but has been postponed due to climbing creatinine   Newly diagnosed systolic  congestive heart failure No ACE inhibitor due to acute kidney injury - on beta blocker   Thrombocytopenia  Check HIT panel - suspect due to acute inflam illness - will not stop heparin due to concern for NSTEMI at this time - no evidence of acute blood loss   Acute kidney injury  Check renal ultrasound - agree with postponing cardiac cath - avoid all potential nephrotoxins - urine output currently 40 cc/h average   Hypotension  Resolved with volume expansion  HTN currently well-controlled  DM Reasonably well controlled at the present time - no change in treatment plan today   Code Status: FULL Family Communication: Lengthy discussion with patient and 2 daughters at bedside again today  Disposition Plan: SDU  Consultants: GI [name] [name] [facility]   Procedures: TTE - 5/9 - EF 30-40% - akinesis of basal-midinferior myocardium - severe hypokinesis lateral myocardium - grade 1 DD  Antibiotics: none  DVT prophylaxis: IV heparin   Objective: Blood pressure 135/74, pulse 94, temperature 97.6 F (36.4 C), temperature source Oral, resp. rate 25, height 6' (1.829 m), weight 83.1 kg (183 lb 3.2 oz), SpO2 93 %.  Intake/Output Summary (Last 24 hours) at 01/16/15 0854 Last data filed at 01/16/15 0700  Gross per 24 hour  Intake 2017.2 ml  Output    975 ml  Net 1042.2 ml   Exam: General: No acute respiratory distress - alert and oriented Lungs: Clear to auscultation bilaterally without wheezes or crackles Cardiovascular: Regular rate  and rhythm without murmur gallop or rub  Abdomen: remarkably non-tender in epigastrium, protuberant but soft, bowel sounds positive, no rebound, no ascites, no appreciable mass Extremities: No significant cyanosis, clubbing, or edema bilateral lower extremities  Data Reviewed: Basic Metabolic Panel:  Recent Labs Lab 01/14/15 0122 01/15/15 0159 01/16/15 0228  NA 139 137 138  K 4.0 4.1 3.8  CL 113* 113* 111  CO2 19* 19* 16*  GLUCOSE 203*  148* 165*  BUN 24* 30* 41*  CREATININE 1.54* 1.65* 1.92*  CALCIUM 7.4* 5.8* 5.5*  MG  --  1.4*  --   PHOS  --  1.7*  --     CBC:  Recent Labs Lab 01/14/15 0122 01/14/15 0816 01/15/15 0159 01/16/15 0228  WBC 16.2* 17.2* 18.2* 14.4*  HGB 16.8 16.8 15.6 13.9  HCT 47.1 47.6 46.0 41.5  MCV 88.2 87.8 89.7 89.8  PLT 120* 119* 83* 69*    Liver Function Tests:  Recent Labs Lab 01/14/15 0122 01/15/15 0159 01/15/15 1130  AST 162* 67*  --   ALT 224* 106*  --   ALKPHOS 63 42  --   BILITOT 2.5* 2.7* 2.6*  PROT 5.8* 5.2*  --   ALBUMIN 2.9* 2.5*  --     Recent Labs Lab 01/13/15 2012 01/15/15 0159 01/15/15 1130  LIPASE 1230* 474*  --   AMYLASE  --   --  978*    Coags:  Recent Labs Lab 01/14/15 0122  INR 1.19    Cardiac Enzymes:  Recent Labs Lab 01/13/15 2012 01/14/15 0122 01/14/15 0816 01/15/15 0159  CKTOTAL  --   --  437*  --   CKMB  --   --  40.7*  --   TROPONINI 2.48* 3.94* 6.57*  6.48* 6.67*    CBG:  Recent Labs Lab 01/15/15 1240 01/15/15 2047 01/16/15 0043 01/16/15 0330 01/16/15 0758  GLUCAP 156* 136* 148* 147* 150*    Recent Results (from the past 240 hour(s))  MRSA PCR Screening     Status: None   Collection Time: 01/13/15  6:33 PM  Result Value Ref Range Status   MRSA by PCR NEGATIVE NEGATIVE Final    Comment:        The GeneXpert MRSA Assay (FDA approved for NASAL specimens only), is one component of a comprehensive MRSA colonization surveillance program. It is not intended to diagnose MRSA infection nor to guide or monitor treatment for MRSA infections.      Studies:   Recent x-ray studies have been reviewed in detail by the Attending Physician  Scheduled Meds:  Scheduled Meds: . antiseptic oral rinse  7 mL Mouth Rinse q12n4p  . aspirin  81 mg Oral Daily  . carvedilol  3.125 mg Oral BID WC  . chlorhexidine  15 mL Mouth Rinse BID  . insulin aspart  0-9 Units Subcutaneous 6 times per day  . latanoprost  1 drop Both  Eyes QHS  . sodium chloride  3 mL Intravenous Q12H    Time spent on care of this patient: 35 mins   [name] [name] , MD   [facility] Office  [phone] Pager - Text Page per [name] [name] as per below:  On-Call/Text Page:      [name]      password [email or web address]  If 7PM-7AM, please contact night-coverage [email or web address] Password [email or web address] 01/16/2015, 8:54 AM   LOS: 3 days

## 2015-01-16 NOTE — Progress Notes (Signed)
Dr. Onalee Hua at bedside assessing patient.

## 2015-01-16 NOTE — Progress Notes (Signed)
ANTICOAGULATION CONSULT NOTE - Follow Up Consult  Pharmacy Consult for Heparin  Indication: chest pain/ACS  No Known Allergies  Patient Measurements: Height: 6' (182.9 cm) Weight: 183 lb 3.2 oz (83.1 kg) IBW/kg (Calculated) : 77.6  Vital Signs: Temp: 98.1 F (36.7 C) (05/11 0331) Temp Source: Oral (05/11 0331) BP: 122/62 mmHg (05/11 0400) Pulse Rate: 96 (05/11 0400)  Labs:  Recent Labs  01/14/15 0122 01/14/15 0816  01/15/15 0159 01/15/15 1100 01/15/15 2139 01/16/15 0228  HGB 16.8 16.8  --  15.6  --   --  13.9  HCT 47.1 47.6  --  46.0  --   --  41.5  PLT 120* 119*  --  83*  --   --  69*  LABPROT 15.2  --   --   --   --   --   --   INR 1.19  --   --   --   --   --   --   HEPARINUNFRC  --  0.23*  < > 0.27* 0.23* <0.10* <0.10*  CREATININE 1.54*  --   --  1.65*  --   --  1.92*  CKTOTAL  --  437*  --   --   --   --   --   CKMB  --  40.7*  --   --   --   --   --   TROPONINI 3.94* 6.57*  6.48*  --  6.67*  --   --   --   < > = values in this interval not displayed.  Estimated Creatinine Clearance: 38.2 mL/min (by C-G formula based on Cr of 1.92).   Assessment: Undetectable heparin level, no issues per RN.   Goal of Therapy:  Heparin level 0.3-0.7 units/ml Monitor platelets by anticoagulation protocol: Yes   Plan:  -Increase heparin to 1450 units/hr  -1300 HL -Daily CBC/HL -Monitor for bleeding  Abran Duke 01/16/2015,4:35 AM

## 2015-01-16 NOTE — Progress Notes (Signed)
Pt HR sustaining in 130's. Pt HR 137 BP: 92/64, O2 Sat 97% on 2L's. Pt resting quietly. No complaints of Chest pain. EKG done. Dr. Onalee Hua notified. Per Dr. Onalee Hua was asked to have pt vagal down. No changes in HR. Dr. Onalee Hua then stated he will come and assess pt.

## 2015-01-16 NOTE — Significant Event (Signed)
Received a page from RN to inform of patients HR being 130s bpm and BP slightly lower than it had been throughout the day.  ECG obtained revealed a short R-P tachcyardia.  Initially told that adenosine could not be administered in that unit.  Vagal maneuvers including bearing down and carotid massage did not result in any change.  RN discussed with charge nurse and agreed to give adenosine with me at bedside.  Zoll hooked up, pads placed and adenosine 6 mg given without effect.  This was repeated with 12 mg resulting in successful block and restoration of SR/ST.  Assessment: Short R-P tachycardia possibly precipitated by acute pancreatitis and inflammation.  Likely AVNRT.  Differential includes AVRT versus less likely AT with first degree AV block.  Plan: Consider increase in beta blocker in order to suppress.  May consider changing to metoprolol if BP remains soft and he requires more aggressive beta blockade.  [name] [name] 01/16/2015 2:29 AM

## 2015-01-16 NOTE — Progress Notes (Signed)
ANTICOAGULATION CONSULT NOTE - Follow Up Consult  Pharmacy Consult for Heparin  Indication: chest pain/ACS  No Known Allergies  Patient Measurements: Height: 6' (182.9 cm) Weight: 183 lb 3.2 oz (83.1 kg) IBW/kg (Calculated) : 77.6  Vital Signs: Temp: 97.4 F (36.3 C) (05/11 2000) Temp Source: Oral (05/11 2000) BP: 126/63 mmHg (05/11 2200) Pulse Rate: 90 (05/11 2200)  Labs:  Recent Labs  01/14/15 0122 01/14/15 0816  01/15/15 0159  01/16/15 0228 01/16/15 1231 01/16/15 2222  HGB 16.8 16.8  --  15.6  --  13.9  --   --   HCT 47.1 47.6  --  46.0  --  41.5  --   --   PLT 120* 119*  --  83*  --  69*  --   --   LABPROT 15.2  --   --   --   --   --   --   --   INR 1.19  --   --   --   --   --   --   --   HEPARINUNFRC  --  0.23*  < > 0.27*  < > <0.10* 0.22* 0.14*  CREATININE 1.54*  --   --  1.65*  --  1.92*  --   --   CKTOTAL  --  437*  --   --   --   --   --   --   CKMB  --  40.7*  --   --   --   --   --   --   TROPONINI 3.94* 6.57*  6.48*  --  6.67*  --   --   --   --   < > = values in this interval not displayed.  Estimated Creatinine Clearance: 38.2 mL/min (by C-G formula based on Cr of 1.92).   Assessment: Sub-therapeutic heparin level, no issues per RN, noted platelets trending down (MD aware, but ok with continuing heparin at this time).   Goal of Therapy:  Heparin level 0.3-0.7 units/ml Monitor platelets by anticoagulation protocol: Yes   Plan:  -Increase heparin to 1800 units/hr  -0800 HL -Daily CBC/HL -Monitor for bleeding, trend PLTS  Abran Duke 01/16/2015,11:30 PM

## 2015-01-16 NOTE — Progress Notes (Signed)
ANTICOAGULATION CONSULT NOTE  Pharmacy Consult for heparin Indication: chest pain/ACS  No Known Allergies  Patient Measurements: Height: 6' (182.9 cm) Weight: 183 lb 3.2 oz (83.1 kg) IBW/kg (Calculated) : 77.6  Vital Signs: Temp: 97.9 F (36.6 C) (05/11 1205) Temp Source: Oral (05/11 1205) BP: 128/65 mmHg (05/11 1205) Pulse Rate: 88 (05/11 1205)  Labs:  Recent Labs  01/14/15 0122 01/14/15 0816  01/15/15 0159  01/15/15 2139 01/16/15 0228 01/16/15 1231  HGB 16.8 16.8  --  15.6  --   --  13.9  --   HCT 47.1 47.6  --  46.0  --   --  41.5  --   PLT 120* 119*  --  83*  --   --  69*  --   LABPROT 15.2  --   --   --   --   --   --   --   INR 1.19  --   --   --   --   --   --   --   HEPARINUNFRC  --  0.23*  < > 0.27*  < > <0.10* <0.10* 0.22*  CREATININE 1.54*  --   --  1.65*  --   --  1.92*  --   CKTOTAL  --  437*  --   --   --   --   --   --   CKMB  --  40.7*  --   --   --   --   --   --   TROPONINI 3.94* 6.57*  6.48*  --  6.67*  --   --   --   --   < > = values in this interval not displayed.    Medical History: Past Medical History  Diagnosis Date  . Hypertension   . Diabetes mellitus type 2 in obese   . Cholelithiasis     Medications:  Prescriptions prior to admission  Medication Sig Dispense Refill Last Dose  . atorvastatin (LIPITOR) 10 MG tablet Take 10 mg by mouth daily.   01/12/2015 at Unknown time  . glipiZIDE (GLUCOTROL XL) 10 MG 24 hr tablet Take 10 mg by mouth 2 (two) times daily.   01/12/2015 at Unknown time  . latanoprost (XALATAN) 0.005 % ophthalmic solution Place 1 drop into both eyes at bedtime.   01/12/2015 at Unknown time  . lisinopril-hydrochlorothiazide (PRINZIDE,ZESTORETIC) 20-12.5 MG per tablet Take 1 tablet by mouth daily.   01/13/2015 at Unknown time  . sildenafil (VIAGRA) 100 MG tablet Take 100 mg by mouth daily as needed for erectile dysfunction.   unknown  . sitaGLIPtin (JANUVIA) 100 MG tablet Take 100 mg by mouth daily.   01/12/2015 at Unknown  time  . vardenafil (LEVITRA) 20 MG tablet Take 20 mg by mouth daily as needed for erectile dysfunction.   unknown   Scheduled:  . antiseptic oral rinse  7 mL Mouth Rinse q12n4p  . aspirin  81 mg Oral Daily  . carvedilol  3.125 mg Oral BID WC  . chlorhexidine  15 mL Mouth Rinse BID  . insulin aspart  0-9 Units Subcutaneous 6 times per day  . latanoprost  1 drop Both Eyes QHS   Infusions:  . sodium chloride 100 mL/hr at 01/16/15 1205  . heparin 1,450 Units/hr (01/16/15 0450)    Assessment: 74yo male experienced syncopal event, tx'd from [facility] for acute gallstone pancreatitis, also noted w/ AKI, now w/ elevated troponin, remains on IV heparin with fluctuating heparin  levels. Most recent heparin level is SUBtherapeutic. Hgb stable, plts continue to fall 120 > 83 > 69, will continue heparin given high likelihood of NSTEMI and no signs of acute blood loss.  Goal of Therapy:  Heparin level 0.3-0.7 units/ml Monitor platelets by anticoagulation protocol: Yes   Plan:  -Increase heparin gtt to 1600 units/hr -Daily HL, CBC -Monitor for s/sx bleeding  -F/u after cath tomorrow -Check level this evening -F/u HIT panel    Agapito Games, PharmD, BCPS Clinical Pharmacist Pager: [phone] 01/16/2015 1:33 PM

## 2015-01-16 NOTE — Progress Notes (Signed)
Daily Rounding Note  01/16/2015, 11:50 AM  LOS: 3 days   SUBJECTIVE:       Actually feeling quite a bit better today. He had a large bowel movement this morning and passed gas and so his abdomen is feeling better. Denies abdominal pain. No nausea. Tolerating clear liquids.   OBJECTIVE:         Vital signs in last 24 hours:    Temp:  [97.6 F (36.4 C)-99 F (37.2 C)] 97.6 F (36.4 C) (05/11 0800) Pulse Rate:  [88-140] 94 (05/11 0800) Resp:  [16-28] 25 (05/11 0800) BP: (92-177)/(51-77) 135/74 mmHg (05/11 0800) SpO2:  [92 %-97 %] 93 % (05/11 0800) Weight:  [183 lb 3.2 oz (83.1 kg)] 183 lb 3.2 oz (83.1 kg) (05/11 0128) Last BM Date: 01/16/15 Filed Weights   01/14/15 0500 01/15/15 0500 01/16/15 0128  Weight: 176 lb 9.4 oz (80.1 kg) 184 lb 1.4 oz (83.5 kg) 183 lb 3.2 oz (83.1 kg)   General: Sitting up in bed. He does look better today. Not ill appearing   Heart: RRR. No MRG. S1/S2 audible. Chest: Clear bilaterally. Excellent breath sounds. No dyspnea or cough. Abdomen: Soft, somewhat protuberant. Still with some tympanitic bowel sounds. No tenderness  Extremities: No CCE. Feet are warm and well perfused. Neuro/Psych:  Pleasant, more alert today. Relaxed. Oriented 3.  Intake/Output from previous day: 05/10 0701 - 05/11 0700 In: 2017.2 [P.O.:240; I.V.:1777.2] Out: 975 [Urine:975]  Intake/Output this shift:    Lab Results:  Recent Labs  01/14/15 0816 01/15/15 0159 01/16/15 0228  WBC 17.2* 18.2* 14.4*  HGB 16.8 15.6 13.9  HCT 47.6 46.0 41.5  PLT 119* 83* 69*   BMET  Recent Labs  01/14/15 0122 01/15/15 0159 01/16/15 0228  NA 139 137 138  K 4.0 4.1 3.8  CL 113* 113* 111  CO2 19* 19* 16*  GLUCOSE 203* 148* 165*  BUN 24* 30* 41*  CREATININE 1.54* 1.65* 1.92*  CALCIUM 7.4* 5.8* 5.5*   LFT  Recent Labs  01/14/15 0122 01/15/15 0159 01/15/15 1130  PROT 5.8* 5.2*  --   ALBUMIN 2.9* 2.5*  --   AST  162* 67*  --   ALT 224* 106*  --   ALKPHOS 63 42  --   BILITOT 2.5* 2.7* 2.6*   PT/INR  Recent Labs  01/14/15 0122  LABPROT 15.2  INR 1.19   Hepatitis Panel No results for input(s): HEPBSAG, HCVAB, HEPAIGM, HEPBIGM in the last 72 hours.  Studies/Results: Dg Abd 1 View  01/15/2015   CLINICAL DATA:  Ileus  EXAM: ABDOMEN - 1 VIEW  COMPARISON:  None.  FINDINGS: Scattered large and small bowel gas is noted. Mildly dilated loops of small bowel are to in the mid abdomen. No free air is noted. No definitive obstructive changes are seen.  IMPRESSION: Mild dilatation of small bowel likely representing a small-bowel ileus. Correlation with the physical exam is recommended.   Electronically Signed   By: Alcide Clever M.D.   On: 01/15/2015 09:18   Mr Abdomen Mrcp Wo Cm  01/16/2015   CLINICAL DATA:  Cholelithiasis with abdominal pain.  EXAM: MRI ABDOMEN WITHOUT CONTRAST  (INCLUDING MRCP)  TECHNIQUE: Multiplanar multisequence MR imaging of the abdomen was performed. Heavily T2-weighted images of the biliary and pancreatic ducts were obtained, and three-dimensional MRCP images were rendered by post processing.  COMPARISON:  None.  FINDINGS: Exam is limited due to patient motion. Patient had difficult tolerating  the length of the exam as well as difficulty breath holding.  Lower chest:  There is dense bibasilar atelectasis.  Hepatobiliary: There is no intrahepatic biliary duct dilatation. No discrete hepatic lesion on this noncontrast exam. There is loss of signal intensity on the opposed phase imaging consistent with hepatic steatosis.  There are several small gallstones layering dependently within the lumen of the gallbladder. These measure approximately 6. There is no gallbladder inflammation evident. Several stones appear to be with in the neck of the gallbladder (image 16, series 3).  The common bile duct and common hepatic duct are not dilated. There is a filling defect within the common bile duct on image  17, series 3. This is small measuring approximately 4 mm. This appears to be replicated on the heavily T2 weighted series 20 on image 3.  Pancreas: There is extensive inflammation involving the head body and tail of the pancreas suggesting acute pancreatitis. Pancreatic duct is difficult to visualize on the sequences due to respiratory motion but does not appear dilated. There is fluid along the anterior perirenal fascia left and right. Small amount fluid around the liver and spleen. No organized fluid collections are evident.  Spleen: Normal spleen.  Adrenals/urinary tract: There several renal lesions have high signal intensity T2 weighted imaging suggesting renal cysts. No hydronephrosis.  Stomach/Bowel: Stomach and limited of the small bowel is unremarkable  Vascular/Lymphatic: Abdominal aortic normal caliber. No retroperitoneal periportal lymphadenopathy.  Musculoskeletal: No aggressive osseous lesion  IMPRESSION: 1. Acute pancreatitis. No organized fluid collections. Enhancement pattern of pancreas cannot be determined. 2. Cholelithiasis without evidence of acute cholecystitis. 3. Potential small 4 mm filling defect within the mid common bile duct. Diagnostic confidence is limited due to patient motion and breathing. No common bile duct dilatation or biliary duct dilatation. 4. Hepatic steatosis. 5. Dense bibasilar atelectasis .   Electronically Signed   By: Genevive Bi M.D.   On: 01/16/2015 08:25   Scheduled Meds: . antiseptic oral rinse  7 mL Mouth Rinse q12n4p  . aspirin  81 mg Oral Daily  . carvedilol  3.125 mg Oral BID WC  . chlorhexidine  15 mL Mouth Rinse BID  . insulin aspart  0-9 Units Subcutaneous 6 times per day  . latanoprost  1 drop Both Eyes QHS   Continuous Infusions: . sodium chloride 75 mL/hr at 01/16/15 0225  . heparin 1,450 Units/hr (01/16/15 0450)   PRN Meds:.hydrALAZINE, HYDROmorphone (DILAUDID) injection, ondansetron **OR** ondansetron (ZOFRAN) IV  ASSESMENT:   *  Biliary pancreatitis.per MR: no fluid collections.   * Choledocholithiasis. Cholelithiasis without obvious cholecystitis.  MRCP with ? Small CBD filling defect.   Transaminases now normal. Total bilirubin slightly improved. Clinically patient much improved.  *  Fatty liver  * Non STEMI. Cardiology following. Demand ischemia is felt to be culprit. On IV heparin x 48 hours. "At some point, he will need an ischemic w/u but would not cath now as we could not commit him to dual antiplatelet therapy at this time with pending GI issues"per Dr Eldridge Dace.  No prexisting cardiac sxs.  Cardiac cath on hold until renal function improves.  * Likely AVNRT.  Note adenosine given for short R-P tachycardia,  to 130s, 0230 this AM.   * Newly diagnosed systolic CHF. EF 35 - 40%. Akinesis/hypokinesis. Grade 1 diastolic dysfunction. This makes hydration problematic. Currently not SOB.   * SB Ileus. Now tolerating clears. Large bowel movement this morning.  * Hypocalcemia.   * Thrombocytopenia. Present at admit, ?  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  * Type 2 DM  * Acute renal failure.Worsening.  IV NS running at 75 ml/hour.    PLAN   *  Timing of ERCP, cardiac cath not pinned down.  At present there is no need to pursue emergent ERCP and he needs a cardiac cath before we would consider sedating him for ERCP. Liver cardiac cath on hold until renal function improves.  *  Follow renal function. Note that Dr. Sharon Seller increased fluids from 75-100 ML's per hour.  *  He is on clear liquids, and wondering if perhaps we could advance the diet?, will defer decision to the physician.    Jennye Moccasin  01/16/2015, 11:50 AM Pager: [phone]   GI Attending  I have also seen and assessed the patient and agree with the advanced practitioner's assessment and plan. Hx/PE same  He is actually better with BM, flatus and less abdominal distention.  Goals/plans  1) better renal fx (kidneys ok on  [place]) 2) cardiac cath 3) advance diet 4) eventual ERCP but could even be weeks down road  Will follow  Iva Boop, MD, [facility] [phone] (pager) 01/16/2015 5:13 PM

## 2015-01-16 NOTE — Progress Notes (Signed)
Provider ordered to give Adenosine. Crash Cart in room. Pads placed on Pt. Adenosine 6mg  given under Dr. Onalee Hua supervision without any changes in HR. Pt stated he feels fine.  Another 12mg  of Adenosine given. HR is now 100 with BP: 101/66. No acute distress noted. Pt has no complaints at this time.

## 2015-01-16 NOTE — Progress Notes (Signed)
SUBJECTIVE:  No chest pain.  Abdomen still  feels tight today. Feels the same but it not one who likes to complain.  He had a bowel movement today.  OBJECTIVE:   Vitals:   Filed Vitals:   01/16/15 0331 01/16/15 0400 01/16/15 0600 01/16/15 0800  BP: 154/72 122/62 125/61 135/74  Pulse: 102 96 96 94  Temp: 98.1 F (36.7 C)   97.6 F (36.4 C)  TempSrc: Oral   Oral  Resp: Height:      Weight:      SpO2: 95% 95% 97% 93%   I&O's:    Intake/Output Summary (Last 24 hours) at 01/16/15 1106 Last data filed at 01/16/15 0700  Gross per 24 hour  Intake 1759.2 ml  Output    875 ml  Net  884.2 ml   TELEMETRY: Reviewed telemetry pt in NSR:     PHYSICAL EXAM General: Well developed, well nourished, in no acute distress Head:   Normal cephalic and atramatic  Lungs:   Clear bilaterally to auscultation. Heart:   HRRR S1 S2  No JVD.   Abdomen: abdomen soft and mildly tender, no rebound, mildly obese Msk:  Back normal,  Normal strength and tone for age. Extremities:   Swollen fingers.   Neuro: Alert and oriented. Psych:  Normal affect, responds appropriately Skin: No rash; redness of the face and upper chest   LABS: Basic Metabolic Panel:  Recent Labs  16/10/96 0159 01/16/15 0228  NA 137 138  K 4.1 3.8  CL 113* 111  CO2 19* 16*  GLUCOSE 148* 165*  BUN 30* 41*  CREATININE 1.65* 1.92*  CALCIUM 5.8* 5.5*  MG 1.4*  --   PHOS 1.7*  --    Liver Function Tests:  Recent Labs  01/14/15 0122 01/15/15 0159 01/15/15 1130  AST 162* 67*  --   ALT 224* 106*  --   ALKPHOS 63 42  --   BILITOT 2.5* 2.7* 2.6*  PROT 5.8* 5.2*  --   ALBUMIN 2.9* 2.5*  --     Recent Labs  01/13/15 2012 01/15/15 0159 01/15/15 1130  LIPASE 1230* 474*  --   AMYLASE  --   --  978*   CBC:  Recent Labs  01/15/15 0159 01/16/15 0228  WBC 18.2* 14.4*  HGB 15.6 13.9  HCT 46.0 41.5  MCV 89.7 89.8  PLT 83* 69*   Cardiac Enzymes:  Recent Labs  01/14/15 0122 01/14/15 0816  01/15/15 0159  CKTOTAL  --  437*  --   CKMB  --  40.7*  --   TROPONINI 3.94* 6.57*  6.48* 6.67*   BNP: Invalid input(s): POCBNP D-Dimer: No results for input(s): DDIMER in the last 72 hours. Hemoglobin A1C:  Recent Labs  01/13/15 2012  HGBA1C 7.5*   Fasting Lipid Panel:  Recent Labs  01/15/15 0159  CHOL 104  HDL 26*  LDLCALC 64  TRIG 71  CHOLHDL 4.0   Thyroid Function Tests: No results for input(s): TSH, T4TOTAL, T3FREE, THYROIDAB in the last 72 hours.  Invalid input(s): FREET3 Anemia Panel: No results for input(s): VITAMINB12, FOLATE, FERRITIN, TIBC, IRON, RETICCTPCT in the last 72 hours. Coag Panel:   Lab Results  Component Value Date   INR 1.19 01/14/2015    RADIOLOGY: Dg Abd 1 View  01/15/2015   CLINICAL DATA:  Ileus  EXAM: ABDOMEN - 1 VIEW  COMPARISON:  None.  FINDINGS: Scattered large and small bowel gas is noted. Mildly dilated  loops of small bowel are to in the mid abdomen. No free air is noted. No definitive obstructive changes are seen.  IMPRESSION: Mild dilatation of small bowel likely representing a small-bowel ileus. Correlation with the physical exam is recommended.   Electronically Signed   By: Alcide Clever M.D.   On: 01/15/2015 09:18   Mr Abdomen Mrcp Wo Cm  01/16/2015   CLINICAL DATA:  Cholelithiasis with abdominal pain.  EXAM: MRI ABDOMEN WITHOUT CONTRAST  (INCLUDING MRCP)  TECHNIQUE: Multiplanar multisequence MR imaging of the abdomen was performed. Heavily T2-weighted images of the biliary and pancreatic ducts were obtained, and three-dimensional MRCP images were rendered by post processing.  COMPARISON:  None.  FINDINGS: Exam is limited due to patient motion. Patient had difficult tolerating the length of the exam as well as difficulty breath holding.  Lower chest:  There is dense bibasilar atelectasis.  Hepatobiliary: There is no intrahepatic biliary duct dilatation. No discrete hepatic lesion on this noncontrast exam. There is loss of signal  intensity on the opposed phase imaging consistent with hepatic steatosis.  There are several small gallstones layering dependently within the lumen of the gallbladder. These measure approximately 6. There is no gallbladder inflammation evident. Several stones appear to be with in the neck of the gallbladder (image 16, series 3).  The common bile duct and common hepatic duct are not dilated. There is a filling defect within the common bile duct on image 17, series 3. This is small measuring approximately 4 mm. This appears to be replicated on the heavily T2 weighted series 20 on image 3.  Pancreas: There is extensive inflammation involving the head body and tail of the pancreas suggesting acute pancreatitis. Pancreatic duct is difficult to visualize on the sequences due to respiratory motion but does not appear dilated. There is fluid along the anterior perirenal fascia left and right. Small amount fluid around the liver and spleen. No organized fluid collections are evident.  Spleen: Normal spleen.  Adrenals/urinary tract: There several renal lesions have high signal intensity T2 weighted imaging suggesting renal cysts. No hydronephrosis.  Stomach/Bowel: Stomach and limited of the small bowel is unremarkable  Vascular/Lymphatic: Abdominal aortic normal caliber. No retroperitoneal periportal lymphadenopathy.  Musculoskeletal: No aggressive osseous lesion  IMPRESSION: 1. Acute pancreatitis. No organized fluid collections. Enhancement pattern of pancreas cannot be determined. 2. Cholelithiasis without evidence of acute cholecystitis. 3. Potential small 4 mm filling defect within the mid common bile duct. Diagnostic confidence is limited due to patient motion and breathing. No common bile duct dilatation or biliary duct dilatation. 4. Hepatic steatosis. 5. Dense bibasilar atelectasis .   Electronically Signed   By: Genevive Bi M.D.   On: 01/16/2015 08:25      ASSESSMENT: NSTEMI, in the setting of hypotension  and gallstone pancreatitis; syncope  PLAN:  Continue IV heparin at this time.  Given RWMA, troponin elevation may be related to plaque rupture MI.  Plan IV heparin for 48 hours. Will plan for diagnostic cath tomorrow if renal function stabilizes.  Postponed cath today due to worsening renal function.  Family h/o CAD.  HTN: BPs elevated at home at times.  COntinue antihypertensives as needed at this time.  Added beta blocker.  Now with episode of SVT, change beta blocker to metoprolol.  Of note, patient is active at home prior to having the gallstone issue.  He walks hills without any problems.  He is not reporting any preexisting cardiac sx.  Syncope: likely vagal from pain.  No  significant arrhythmia on tele at this time.   Moderate renal insufficiency.  Cr slightly increased this AM.  Hydrate but watch for fluid overload in the setting of cardiomyopathy.  I discussed the plan with the patient and his family at length.  Cath resceduled for tomorrow, if Cr better.  I think there is some frustration that we are not doing any procedures , but just waiting instead.  I explained that aggressive intervention at this point could cause more harm than good at this time.  I think his ischemia has stabilized since his Troponin levelled off.  WIll recheck a troponin in the morning.  If Cr continues to rise, would abtain renal consult.  Avoid nephrotoxins.  MRCP performed per Dr. Leone Payor.  Try to decide if any emergent GI procedures need to be done.  If so, he will need expedited cardiac w/u.  If not, will try to optimize renal status prior to cardiac w/u.  The patient is quite complex.  Multiple organ systems are involved.  WBC, lipase improved.  Hopefully kidney function will follow soon.  Corky Crafts, MD  01/16/2015  11:06 AM

## 2015-01-16 NOTE — Progress Notes (Signed)
ANTICOAGULATION CONSULT NOTE - Follow Up Consult  Pharmacy Consult for Heparin  Indication: chest pain/ACS  No Known Allergies  Patient Measurements: Height: 6' (182.9 cm) Weight: 184 lb 1.4 oz (83.5 kg) IBW/kg (Calculated) : 77.6  Vital Signs: Temp: 98.5 F (36.9 C) (05/10 1952) Temp Source: Axillary (05/10 1952) BP: 113/55 mmHg (05/10 2200) Pulse Rate: 88 (05/10 2200)  Labs:  Recent Labs  01/14/15 0122 01/14/15 0816  01/15/15 0159 01/15/15 1100 01/15/15 2139  HGB 16.8 16.8  --  15.6  --   --   HCT 47.1 47.6  --  46.0  --   --   PLT 120* 119*  --  83*  --   --   LABPROT 15.2  --   --   --   --   --   INR 1.19  --   --   --   --   --   HEPARINUNFRC  --  0.23*  < > 0.27* 0.23* <0.10*  CREATININE 1.54*  --   --  1.65*  --   --   CKTOTAL  --  437*  --   --   --   --   CKMB  --  40.7*  --   --   --   --   TROPONINI 3.94* 6.57*  6.48*  --  6.67*  --   --   < > = values in this interval not displayed.  Estimated Creatinine Clearance: 44.4 mL/min (by C-G formula based on Cr of 1.65).   Assessment: Undetectable heparin level (heparin was OFF for approximately one hour around 1800).   Goal of Therapy:  Heparin level 0.3-0.7 units/ml Monitor platelets by anticoagulation protocol: Yes   Plan:  -Continue heparin at 1250 units/hr given off time before heparin level was drawn -F/U HL with AM labs, adjust as needed -Daily CBC/HL -Monitor for bleeding  [name] [name] 01/16/2015,12:20 AM

## 2015-01-17 ENCOUNTER — Inpatient Hospital Stay (HOSPITAL_COMMUNITY): Payer: Medicare Other

## 2015-01-17 ENCOUNTER — Encounter (HOSPITAL_COMMUNITY)
Admission: AD | Disposition: A | Discharge status: Home Health | Payer: Medicare Other | Source: Other Acute Inpatient Hospital | Attending: Internal Medicine

## 2015-01-17 DIAGNOSIS — I5021 Acute systolic (congestive) heart failure: Secondary | ICD-10-CM | POA: Insufficient documentation

## 2015-01-17 DIAGNOSIS — K810 Acute cholecystitis: Secondary | ICD-10-CM | POA: Insufficient documentation

## 2015-01-17 DIAGNOSIS — E872 Acidosis, unspecified: Secondary | ICD-10-CM | POA: Insufficient documentation

## 2015-01-17 DIAGNOSIS — N19 Unspecified kidney failure: Secondary | ICD-10-CM

## 2015-01-17 DIAGNOSIS — N179 Acute kidney failure, unspecified: Secondary | ICD-10-CM | POA: Insufficient documentation

## 2015-01-17 DIAGNOSIS — K8000 Calculus of gallbladder with acute cholecystitis without obstruction: Secondary | ICD-10-CM | POA: Insufficient documentation

## 2015-01-17 DIAGNOSIS — I2510 Atherosclerotic heart disease of native coronary artery without angina pectoris: Secondary | ICD-10-CM

## 2015-01-17 HISTORY — PX: CARDIAC CATHETERIZATION: SHX172

## 2015-01-17 LAB — CBC
HCT: 37.9 % — ABNORMAL LOW (ref 39.0–52.0)
HCT: 40.1 % (ref 39.0–52.0)
HEMOGLOBIN: 13 g/dL (ref 13.0–17.0)
HEMOGLOBIN: 13.9 g/dL (ref 13.0–17.0)
MCH: 30.7 pg (ref 26.0–34.0)
MCH: 30.8 pg (ref 26.0–34.0)
MCHC: 34.3 g/dL (ref 30.0–36.0)
MCHC: 34.7 g/dL (ref 30.0–36.0)
MCV: 89.6 fL (ref 78.0–100.0)
MCV: 88.9 fL (ref 78.0–100.0)
Platelets: 61 10*3/uL — ABNORMAL LOW (ref 150–400)
Platelets: 105 10*3/uL — ABNORMAL LOW (ref 150–400)
RBC: 4.23 MIL/uL (ref 4.22–5.81)
RBC: 4.51 MIL/uL (ref 4.22–5.81)
RDW: 14.1 % (ref 11.5–15.5)
RDW: 13.8 % (ref 11.5–15.5)
WBC: 14.9 10*3/uL — ABNORMAL HIGH (ref 4.0–10.5)
WBC: 15.2 10*3/uL — AB (ref 4.0–10.5)

## 2015-01-17 LAB — COMPREHENSIVE METABOLIC PANEL
ALBUMIN: 2 g/dL — AB (ref 3.5–5.0)
ALBUMIN: 2.2 g/dL — AB (ref 3.5–5.0)
ALK PHOS: 62 U/L (ref 38–126)
ALT: 49 U/L (ref 17–63)
ALT: 52 U/L (ref 17–63)
AST: 37 U/L (ref 15–41)
AST: 38 U/L (ref 15–41)
Alkaline Phosphatase: 58 U/L (ref 38–126)
Anion gap: 12 (ref 5–15)
Anion gap: 17 — ABNORMAL HIGH (ref 5–15)
BILIRUBIN TOTAL: 2.4 mg/dL — AB (ref 0.3–1.2)
BUN: 43 mg/dL — ABNORMAL HIGH (ref 6–20)
BUN: 42 mg/dL — ABNORMAL HIGH (ref 6–20)
CALCIUM: 5.3 mg/dL — AB (ref 8.9–10.3)
CHLORIDE: 114 mmol/L — AB (ref 101–111)
CHLORIDE: 109 mmol/L (ref 101–111)
CO2: 15 mmol/L — AB (ref 22–32)
CO2: 17 mmol/L — ABNORMAL LOW (ref 22–32)
Calcium: 5.9 mg/dL — CL (ref 8.9–10.3)
Creatinine, Ser: 1.52 mg/dL — ABNORMAL HIGH (ref 0.61–1.24)
Creatinine, Ser: 1.52 mg/dL — ABNORMAL HIGH (ref 0.61–1.24)
GFR calc Af Amer: 51 mL/min — ABNORMAL LOW (ref >60)
GFR calc Af Amer: 51 mL/min — ABNORMAL LOW (ref >60)
GFR calc non Af Amer: 44 mL/min — ABNORMAL LOW (ref >60)
GFR calc non Af Amer: 44 mL/min — ABNORMAL LOW (ref >60)
Glucose, Bld: 113 mg/dL — ABNORMAL HIGH (ref 65–99)
Glucose, Bld: 187 mg/dL — ABNORMAL HIGH (ref 65–99)
POTASSIUM: 3.2 mmol/L — AB (ref 3.5–5.1)
Potassium: 4.5 mmol/L (ref 3.5–5.1)
SODIUM: 141 mmol/L (ref 135–145)
SODIUM: 143 mmol/L (ref 135–145)
Total Bilirubin: 2 mg/dL — ABNORMAL HIGH (ref 0.3–1.2)
Total Protein: 5.2 g/dL — ABNORMAL LOW (ref 6.5–8.1)
Total Protein: 5.4 g/dL — ABNORMAL LOW (ref 6.5–8.1)

## 2015-01-17 LAB — TROPONIN I: TROPONIN I: 1.47 ng/mL — AB (ref <0.031)

## 2015-01-17 LAB — CALCIUM, IONIZED: CALCIUM, IONIZED, SERUM: 3 mg/dL — AB (ref 4.5–5.6)

## 2015-01-17 LAB — GLUCOSE, CAPILLARY
GLUCOSE-CAPILLARY: 144 mg/dL — AB (ref 65–99)
Glucose-Capillary: 112 mg/dL — ABNORMAL HIGH (ref 65–99)
Glucose-Capillary: 128 mg/dL — ABNORMAL HIGH (ref 65–99)
Glucose-Capillary: 130 mg/dL — ABNORMAL HIGH (ref 65–99)
Glucose-Capillary: 164 mg/dL — ABNORMAL HIGH (ref 65–99)
Glucose-Capillary: 170 mg/dL — ABNORMAL HIGH (ref 65–99)

## 2015-01-17 LAB — HEPARIN LEVEL (UNFRACTIONATED): HEPARIN UNFRACTIONATED: 0.46 [IU]/mL (ref 0.30–0.70)

## 2015-01-17 LAB — HEPARIN INDUCED THROMBOCYTOPENIA PNL: Heparin Induced Plt Ab: 0.292 OD (ref 0.000–0.400)

## 2015-01-17 SURGERY — LEFT HEART CATH AND CORONARY ANGIOGRAPHY
Anesthesia: LOCAL

## 2015-01-17 MED ORDER — FUROSEMIDE 10 MG/ML IJ SOLN
INTRAMUSCULAR | Status: AC
  Administered 2015-01-17: 19:00:00 20 mg
  Filled 2015-01-17: qty 2

## 2015-01-17 MED ORDER — MIDAZOLAM HCL 2 MG/2ML IJ SOLN
INTRAMUSCULAR | Status: DC | PRN
  Administered 2015-01-17: 0.5 mg via INTRAVENOUS

## 2015-01-17 MED ORDER — FENTANYL CITRATE (PF) 100 MCG/2ML IJ SOLN
INTRAMUSCULAR | Status: DC | PRN
  Administered 2015-01-17: 50 ug via INTRAVENOUS

## 2015-01-17 MED ORDER — SODIUM CHLORIDE 0.9 % WEIGHT BASED INFUSION: 1.0000 mL/kg/h | INTRAVENOUS | Status: AC

## 2015-01-17 MED ORDER — HEPARIN SODIUM (PORCINE) 5000 UNIT/ML IJ SOLN
5000.0000 [IU] | Freq: Three times a day (TID) | INTRAMUSCULAR | Status: DC
  Administered 2015-01-17 – 2015-01-20 (×8): 5000 [IU] via SUBCUTANEOUS
  Filled 2015-01-17 (×14): qty 1

## 2015-01-17 MED ORDER — ACETAMINOPHEN 325 MG PO TABS
650.0000 mg | ORAL_TABLET | ORAL | Status: DC | PRN
  Administered 2015-01-18: 23:00:00 650 mg via ORAL
  Filled 2015-01-17 (×2): qty 2

## 2015-01-17 MED ORDER — LIDOCAINE HCL (PF) 1 % IJ SOLN
INTRAMUSCULAR | Status: AC
  Filled 2015-01-17: qty 30

## 2015-01-17 MED ORDER — PIPERACILLIN-TAZOBACTAM 3.375 G IVPB 30 MIN
3.3750 g | INTRAVENOUS | Status: AC
  Administered 2015-01-17: 3.375 g via INTRAVENOUS
  Filled 2015-01-17: qty 50

## 2015-01-17 MED ORDER — SODIUM CHLORIDE 0.9 % IJ SOLN: 3.0000 mL | INTRAMUSCULAR | Status: DC | PRN

## 2015-01-17 MED ORDER — SODIUM CHLORIDE 0.9 % IV BOLUS (SEPSIS)
500.0000 mL | Freq: Once | INTRAVENOUS | Status: AC
  Administered 2015-01-17: 1000 mL via INTRAVENOUS

## 2015-01-17 MED ORDER — VERAPAMIL HCL 2.5 MG/ML IV SOLN
INTRAVENOUS | Status: DC | PRN
  Administered 2015-01-17: 13:00:00 via INTRA_ARTERIAL

## 2015-01-17 MED ORDER — ZOLPIDEM TARTRATE 5 MG PO TABS
5.0000 mg | ORAL_TABLET | Freq: Once | ORAL | Status: DC
  Filled 2015-01-17: qty 1

## 2015-01-17 MED ORDER — SODIUM CHLORIDE 0.9 % IV SOLN
2.0000 g | Freq: Once | INTRAVENOUS | Status: AC
  Administered 2015-01-17: 2 g via INTRAVENOUS
  Filled 2015-01-17: qty 20

## 2015-01-17 MED ORDER — SODIUM CHLORIDE 0.9 % IJ SOLN
3.0000 mL | Freq: Two times a day (BID) | INTRAMUSCULAR | Status: DC
  Administered 2015-01-18: 09:00:00 3 mL via INTRAVENOUS

## 2015-01-17 MED ORDER — HEPARIN SODIUM (PORCINE) 1000 UNIT/ML IJ SOLN
INTRAMUSCULAR | Status: AC
  Filled 2015-01-17: qty 1

## 2015-01-17 MED ORDER — VERAPAMIL HCL 2.5 MG/ML IV SOLN
INTRAVENOUS | Status: AC
  Filled 2015-01-17: qty 2

## 2015-01-17 MED ORDER — IOHEXOL 300 MG/ML  SOLN
INTRAMUSCULAR | Status: DC | PRN
  Administered 2015-01-17: 40 mL via INTRA_ARTERIAL

## 2015-01-17 MED ORDER — HEPARIN SODIUM (PORCINE) 1000 UNIT/ML IJ SOLN
INTRAMUSCULAR | Status: DC | PRN
  Administered 2015-01-17: 4000 [IU] via INTRAVENOUS

## 2015-01-17 MED ORDER — OXYCODONE-ACETAMINOPHEN 5-325 MG PO TABS: 1.0000 | ORAL_TABLET | ORAL | Status: DC | PRN

## 2015-01-17 MED ORDER — MIDAZOLAM HCL 2 MG/2ML IJ SOLN
INTRAMUSCULAR | Status: AC
  Filled 2015-01-17: qty 2

## 2015-01-17 MED ORDER — ONDANSETRON HCL 4 MG/2ML IJ SOLN
4.0000 mg | Freq: Four times a day (QID) | INTRAMUSCULAR | Status: DC | PRN
  Administered 2015-01-17 – 2015-01-18 (×3): 4 mg via INTRAVENOUS
  Filled 2015-01-17 (×4): qty 2

## 2015-01-17 MED ORDER — HEPARIN (PORCINE) IN NACL 2-0.9 UNIT/ML-% IJ SOLN
INTRAMUSCULAR | Status: AC
  Filled 2015-01-17: qty 500

## 2015-01-17 MED ORDER — SODIUM BICARBONATE 8.4 % IV SOLN
50.0000 meq | Freq: Once | INTRAVENOUS | Status: AC
  Administered 2015-01-17: 50 meq via INTRAVENOUS
  Filled 2015-01-17: qty 50

## 2015-01-17 MED ORDER — FENTANYL CITRATE (PF) 100 MCG/2ML IJ SOLN
INTRAMUSCULAR | Status: AC
  Filled 2015-01-17: qty 2

## 2015-01-17 MED ORDER — HEPARIN (PORCINE) IN NACL 2-0.9 UNIT/ML-% IJ SOLN
INTRAMUSCULAR | Status: AC
  Filled 2015-01-17: qty 1000

## 2015-01-17 MED ORDER — PIPERACILLIN-TAZOBACTAM 3.375 G IVPB
3.3750 g | Freq: Three times a day (TID) | INTRAVENOUS | Status: DC
  Administered 2015-01-17 – 2015-01-18 (×2): 3.375 g via INTRAVENOUS
  Filled 2015-01-17 (×4): qty 50

## 2015-01-17 MED ORDER — SODIUM CHLORIDE 0.9 % IV SOLN: 250.0000 mL | INTRAVENOUS | Status: DC | PRN

## 2015-01-17 MED ORDER — NITROGLYCERIN 1 MG/10 ML FOR IR/CATH LAB
INTRA_ARTERIAL | Status: AC
  Filled 2015-01-17: qty 10

## 2015-01-17 SURGICAL SUPPLY — 12 items
CATH INFINITI 5 FR JL3.5 (CATHETERS) ×2 IMPLANT
CATH INFINITI JR4 5F (CATHETERS) ×2 IMPLANT
CATH SITESEER 5F MULTI A 2 (CATHETERS) IMPLANT
DEVICE RAD COMP TR BAND LRG (VASCULAR PRODUCTS) ×2 IMPLANT
GLIDESHEATH SLEND A-KIT 6F 22G (SHEATH) ×2 IMPLANT
KIT HEART LEFT (KITS) ×2 IMPLANT
PACK CARDIAC CATHETERIZATION (CUSTOM PROCEDURE TRAY) ×2 IMPLANT
SHEATH PINNACLE 5F 10CM (SHEATH) IMPLANT
TRANSDUCER W/STOPCOCK (MISCELLANEOUS) ×2 IMPLANT
TUBING CIL FLEX 10 FLL-RA (TUBING) ×2 IMPLANT
WIRE EMERALD 3MM-J .035X150CM (WIRE) IMPLANT
WIRE SAFE-T 1.5MM-J .035X260CM (WIRE) ×2 IMPLANT

## 2015-01-17 NOTE — Progress Notes (Addendum)
SUBJECTIVE:  No chest pain.  Abdomen feels better today.  He had another bowel movement today.    OBJECTIVE:   Vitals:   Filed Vitals:   01/17/15 0400 01/17/15 0600 01/17/15 0704 01/17/15 0733  BP: 153/71 151/69 150/68 150/68  Pulse: 89 91 90 94  Temp: 97.9 F (36.6 C)   98.1 F (36.7 C)  TempSrc: Oral   Oral  Resp: Height:      Weight: 186 lb 11.7 oz (84.7 kg)     SpO2: 98% 97% 97% 95%   I&O's:    Intake/Output Summary (Last 24 hours) at 01/17/15 0825 Last data filed at 01/17/15 0700  Gross per 24 hour  Intake 3393.75 ml  Output   1850 ml  Net 1543.75 ml   TELEMETRY: Reviewed telemetry pt in NSR:     PHYSICAL EXAM General: Well developed, well nourished, in no acute distress Head:   Normal cephalic and atramatic  Lungs:   Clear bilaterally to auscultation. Heart:   HRRR S1 S2  No JVD.   Abdomen: abdomen soft, less tender, no rebound, mildly obese Msk:  Back normal,  Normal strength and tone for age. Extremities:   Swollen fingers.   Neuro: Alert and oriented. Psych:  Normal affect, responds appropriately Skin: No rash; redness of the face and upper chest   LABS: Basic Metabolic Panel:  Recent Labs  16/10/96 0159 01/16/15 0228 01/17/15 0249  NA 137 138 141  K 4.1 3.8 4.5  CL 113* 111 114*  CO2 19* 16* 15*  GLUCOSE 148* 165* 113*  BUN 30* 41* 43*  CREATININE 1.65* 1.92* 1.52*  CALCIUM 5.8* 5.5* 5.3*  MG 1.4*  --   --   PHOS 1.7*  --   --    Liver Function Tests:  Recent Labs  01/16/15 1235 01/17/15 0249  AST 35 37  ALT 56 49  ALKPHOS 45 58  BILITOT 2.5* 2.0*  PROT 5.0* 5.2*  ALBUMIN 2.1* 2.0*    Recent Labs  01/15/15 0159 01/15/15 1130  LIPASE 474*  --   AMYLASE  --  978*   CBC:  Recent Labs  01/16/15 0228 01/17/15 0249  WBC 14.4* 14.9*  HGB 13.9 13.0  HCT 41.5 37.9*  MCV 89.8 89.6  PLT 69* 61*   Cardiac Enzymes:  Recent Labs  01/15/15 0159 01/17/15 0249  TROPONINI 6.67* 1.47*   BNP: Invalid  input(s): POCBNP D-Dimer: No results for input(s): DDIMER in the last 72 hours. Hemoglobin A1C: No results for input(s): HGBA1C in the last 72 hours. Fasting Lipid Panel:  Recent Labs  01/15/15 0159  CHOL 104  HDL 26*  LDLCALC 64  TRIG 71  CHOLHDL 4.0   Thyroid Function Tests: No results for input(s): TSH, T4TOTAL, T3FREE, THYROIDAB in the last 72 hours.  Invalid input(s): FREET3 Anemia Panel: No results for input(s): VITAMINB12, FOLATE, FERRITIN, TIBC, IRON, RETICCTPCT in the last 72 hours. Coag Panel:   Lab Results  Component Value Date   INR 1.19 01/14/2015    RADIOLOGY: Dg Abd 1 View  01/15/2015   CLINICAL DATA:  Ileus  EXAM: ABDOMEN - 1 VIEW  COMPARISON:  None.  FINDINGS: Scattered large and small bowel gas is noted. Mildly dilated loops of small bowel are to in the mid abdomen. No free air is noted. No definitive obstructive changes are seen.  IMPRESSION: Mild dilatation of small bowel likely representing a small-bowel ileus. Correlation with the physical exam is recommended.  Electronically Signed   By: Alcide Clever M.D.   On: 01/15/2015 09:18   US Renal  01/16/2015   CLINICAL DATA:  Acute renal failure  EXAM: RENAL / URINARY TRACT ULTRASOUND COMPLETE  COMPARISON:  None.  FINDINGS: Right Kidney:  Length: 11.4 cm. Echogenicity within normal limits. No mass or hydronephrosis visualized.  Left Kidney:  Length: 11.9 cm. Echogenicity within normal limits. No mass or hydronephrosis visualized.  Bladder:  Appears normal for degree of bladder distention.  Small amount of ascites.  IMPRESSION: Normal renal ultrasound.   Electronically Signed   By: Amie Portland M.D.   On: 01/16/2015 15:58   Mr Abdomen Mrcp Wo Cm  01/16/2015   CLINICAL DATA:  Cholelithiasis with abdominal pain.  EXAM: MRI ABDOMEN WITHOUT CONTRAST  (INCLUDING MRCP)  TECHNIQUE: Multiplanar multisequence MR imaging of the abdomen was performed. Heavily T2-weighted images of the biliary and pancreatic ducts were  obtained, and three-dimensional MRCP images were rendered by post processing.  COMPARISON:  None.  FINDINGS: Exam is limited due to patient motion. Patient had difficult tolerating the length of the exam as well as difficulty breath holding.  Lower chest:  There is dense bibasilar atelectasis.  Hepatobiliary: There is no intrahepatic biliary duct dilatation. No discrete hepatic lesion on this noncontrast exam. There is loss of signal intensity on the opposed phase imaging consistent with hepatic steatosis.  There are several small gallstones layering dependently within the lumen of the gallbladder. These measure approximately 6. There is no gallbladder inflammation evident. Several stones appear to be with in the neck of the gallbladder (image 16, series 3).  The common bile duct and common hepatic duct are not dilated. There is a filling defect within the common bile duct on image 17, series 3. This is small measuring approximately 4 mm. This appears to be replicated on the heavily T2 weighted series 20 on image 3.  Pancreas: There is extensive inflammation involving the head body and tail of the pancreas suggesting acute pancreatitis. Pancreatic duct is difficult to visualize on the sequences due to respiratory motion but does not appear dilated. There is fluid along the anterior perirenal fascia left and right. Small amount fluid around the liver and spleen. No organized fluid collections are evident.  Spleen: Normal spleen.  Adrenals/urinary tract: There several renal lesions have high signal intensity T2 weighted imaging suggesting renal cysts. No hydronephrosis.  Stomach/Bowel: Stomach and limited of the small bowel is unremarkable  Vascular/Lymphatic: Abdominal aortic normal caliber. No retroperitoneal periportal lymphadenopathy.  Musculoskeletal: No aggressive osseous lesion  IMPRESSION: 1. Acute pancreatitis. No organized fluid collections. Enhancement pattern of pancreas cannot be determined. 2.  Cholelithiasis without evidence of acute cholecystitis. 3. Potential small 4 mm filling defect within the mid common bile duct. Diagnostic confidence is limited due to patient motion and breathing. No common bile duct dilatation or biliary duct dilatation. 4. Hepatic steatosis. 5. Dense bibasilar atelectasis .   Electronically Signed   By: Genevive Bi M.D.   On: 01/16/2015 08:25      ASSESSMENT: NSTEMI, in the setting of hypotension and gallstone pancreatitis; syncope  PLAN:  Continue IV heparin at this time.  Given RWMA, troponin elevation may be related to plaque rupture MI.  Plan IV heparin for 48 hours. Will plan for cardiac cath today.  Postponed cath yesterday due to worsening renal function.  No Vgram.  If straightforward PCI opportunity present, would perform PCI with Bare metal stent as he may need GI procedures in the  near future.  Family h/o CAD.  HTN: BPs elevated at home at times.  COntinue antihypertensives as needed at this time.  Added beta blocker.  Now with episode of SVT, change beta blocker to metoprolol.  Of note, patient is active at home prior to having the gallstone issue.  He walks hills without any problems.  He is not reporting any preexisting cardiac sx.  Syncope: likely vagal from pain.  No significant arrhythmia on tele at this time.   Moderate renal insufficiency.  Cr better this AM.  Baseline Cr 1.5 as outpatient. Hydrate but watch for fluid overload in the setting of cardiomyopathy.  I discussed the plan with the patient and his family at length.  Cath later today.   I think his ischemia has stabilized since his Troponin levelled off.   Avoid nephrotoxins.  MRCP performed per Dr. Leone Payor.  No emergent GI procedures need to be done.   The patient is quite complex.  Multiple organ systems are involved.  WBC, lipase improved. Kidney function improved.  Plts decreased. Stable. Likely due to inflammatory state.  Plan BMS if PCI needed.  Corky Crafts, MD  01/17/2015  8:25 AM

## 2015-01-17 NOTE — Progress Notes (Signed)
    Assessed patient. Dyspnea. Been having dyspnea for days, a few weeks. No crackles on exam. RR 29 however. Sat normal. Anxious. CO2 15 (metabolic acidosis)  I will hold his IV fluids (been running at 85/hr) and give one dose of lasix 20mg  IV.  Spoke with Dr. Joseph Art regarding plan.   Monitor. No ECG changes.  Severe CAD. Await TCTS consult. Will likely need to see improvement from metabolic issues (pancreatitis) prior to CABG.   Discussed with family.  Donato Schultz, MD

## 2015-01-17 NOTE — Interval H&P Note (Signed)
Cath Lab Visit (complete for each Cath Lab visit)  Clinical Evaluation Leading to the Procedure:   ACS: Yes.    Non-ACS:    Anginal Classification: No Symptoms  Anti-ischemic medical therapy: Maximal Therapy (2 or more classes of medications)  Non-Invasive Test Results: No non-invasive testing performed  Prior CABG: No previous CABG      History and Physical Interval Note:  01/17/2015 12:57 PM  [name] [name]  has presented today for surgery, with the diagnosis of cp  The various methods of treatment have been discussed with the patient and family. After consideration of risks, benefits and other options for treatment, the patient has consented to  Procedure(s): Left Heart Cath and Coronary Angiography (N/A) as a surgical intervention .  The patient's history has been reviewed, patient examined, no change in status, stable for surgery.  I have reviewed the patient's chart and labs.  Questions were answered to the patient's satisfaction.     Lesleigh Noe

## 2015-01-17 NOTE — H&P (View-Only) (Signed)
 SUBJECTIVE:  No chest pain.  Abdomen feels better today.  He had another bowel movement today.    OBJECTIVE:   Vitals:   Filed Vitals:   01/17/15 0400 01/17/15 0600 01/17/15 0704 01/17/15 0733  BP: 153/71 151/69 150/68 150/68  Pulse: 89 91 90 94  Temp: 97.9 F (36.6 C)   98.1 F (36.7 C)  TempSrc: Oral   Oral  Resp: 23 26 24 18  Height:      Weight: 186 lb 11.7 oz (84.7 kg)     SpO2: 98% 97% 97% 95%   I&O's:    Intake/Output Summary (Last 24 hours) at 01/17/15 0825 Last data filed at 01/17/15 0700  Gross per 24 hour  Intake 3393.75 ml  Output   1850 ml  Net 1543.75 ml   TELEMETRY: Reviewed telemetry pt in NSR:     PHYSICAL EXAM General: Well developed, well nourished, in no acute distress Head:   Normal cephalic and atramatic  Lungs:   Clear bilaterally to auscultation. Heart:   HRRR S1 S2  No JVD.   Abdomen: abdomen soft, less tender, no rebound, mildly obese Msk:  Back normal,  Normal strength and tone for age. Extremities:   Swollen fingers.   Neuro: Alert and oriented. Psych:  Normal affect, responds appropriately Skin: No rash; redness of the face and upper chest   LABS: Basic Metabolic Panel:  Recent Labs  01/15/15 0159 01/16/15 0228 01/17/15 0249  NA 137 138 141  K 4.1 3.8 4.5  CL 113* 111 114*  CO2 19* 16* 15*  GLUCOSE 148* 165* 113*  BUN 30* 41* 43*  CREATININE 1.65* 1.92* 1.52*  CALCIUM 5.8* 5.5* 5.3*  MG 1.4*  --   --   PHOS 1.7*  --   --    Liver Function Tests:  Recent Labs  01/16/15 1235 01/17/15 0249  AST 35 37  ALT 56 49  ALKPHOS 45 58  BILITOT 2.5* 2.0*  PROT 5.0* 5.2*  ALBUMIN 2.1* 2.0*    Recent Labs  01/15/15 0159 01/15/15 1130  LIPASE 474*  --   AMYLASE  --  978*   CBC:  Recent Labs  01/16/15 0228 01/17/15 0249  WBC 14.4* 14.9*  HGB 13.9 13.0  HCT 41.5 37.9*  MCV 89.8 89.6  PLT 69* 61*   Cardiac Enzymes:  Recent Labs  01/15/15 0159 01/17/15 0249  TROPONINI 6.67* 1.47*   BNP: Invalid  input(s): POCBNP D-Dimer: No results for input(s): DDIMER in the last 72 hours. Hemoglobin A1C: No results for input(s): HGBA1C in the last 72 hours. Fasting Lipid Panel:  Recent Labs  01/15/15 0159  CHOL 104  HDL 26*  LDLCALC 64  TRIG 71  CHOLHDL 4.0   Thyroid Function Tests: No results for input(s): TSH, T4TOTAL, T3FREE, THYROIDAB in the last 72 hours.  Invalid input(s): FREET3 Anemia Panel: No results for input(s): VITAMINB12, FOLATE, FERRITIN, TIBC, IRON, RETICCTPCT in the last 72 hours. Coag Panel:   Lab Results  Component Value Date   INR 1.19 01/14/2015    RADIOLOGY: Dg Abd 1 View  01/15/2015   CLINICAL DATA:  Ileus  EXAM: ABDOMEN - 1 VIEW  COMPARISON:  None.  FINDINGS: Scattered large and small bowel gas is noted. Mildly dilated loops of small bowel are to in the mid abdomen. No free air is noted. No definitive obstructive changes are seen.  IMPRESSION: Mild dilatation of small bowel likely representing a small-bowel ileus. Correlation with the physical exam is recommended.     Electronically Signed   By: Mark  Lukens M.D.   On: 01/15/2015 09:18   Us Renal  01/16/2015   CLINICAL DATA:  Acute renal failure  EXAM: RENAL / URINARY TRACT ULTRASOUND COMPLETE  COMPARISON:  None.  FINDINGS: Right Kidney:  Length: 11.4 cm. Echogenicity within normal limits. No mass or hydronephrosis visualized.  Left Kidney:  Length: 11.9 cm. Echogenicity within normal limits. No mass or hydronephrosis visualized.  Bladder:  Appears normal for degree of bladder distention.  Small amount of ascites.  IMPRESSION: Normal renal ultrasound.   Electronically Signed   By: David  Ormond M.D.   On: 01/16/2015 15:58   Mr Abdomen Mrcp Wo Cm  01/16/2015   CLINICAL DATA:  Cholelithiasis with abdominal pain.  EXAM: MRI ABDOMEN WITHOUT CONTRAST  (INCLUDING MRCP)  TECHNIQUE: Multiplanar multisequence MR imaging of the abdomen was performed. Heavily T2-weighted images of the biliary and pancreatic ducts were  obtained, and three-dimensional MRCP images were rendered by post processing.  COMPARISON:  None.  FINDINGS: Exam is limited due to patient motion. Patient had difficult tolerating the length of the exam as well as difficulty breath holding.  Lower chest:  There is dense bibasilar atelectasis.  Hepatobiliary: There is no intrahepatic biliary duct dilatation. No discrete hepatic lesion on this noncontrast exam. There is loss of signal intensity on the opposed phase imaging consistent with hepatic steatosis.  There are several small gallstones layering dependently within the lumen of the gallbladder. These measure approximately 6. There is no gallbladder inflammation evident. Several stones appear to be with in the neck of the gallbladder (image 16, series 3).  The common bile duct and common hepatic duct are not dilated. There is a filling defect within the common bile duct on image 17, series 3. This is small measuring approximately 4 mm. This appears to be replicated on the heavily T2 weighted series 20 on image 3.  Pancreas: There is extensive inflammation involving the head body and tail of the pancreas suggesting acute pancreatitis. Pancreatic duct is difficult to visualize on the sequences due to respiratory motion but does not appear dilated. There is fluid along the anterior perirenal fascia left and right. Small amount fluid around the liver and spleen. No organized fluid collections are evident.  Spleen: Normal spleen.  Adrenals/urinary tract: There several renal lesions have high signal intensity T2 weighted imaging suggesting renal cysts. No hydronephrosis.  Stomach/Bowel: Stomach and limited of the small bowel is unremarkable  Vascular/Lymphatic: Abdominal aortic normal caliber. No retroperitoneal periportal lymphadenopathy.  Musculoskeletal: No aggressive osseous lesion  IMPRESSION: 1. Acute pancreatitis. No organized fluid collections. Enhancement pattern of pancreas cannot be determined. 2.  Cholelithiasis without evidence of acute cholecystitis. 3. Potential small 4 mm filling defect within the mid common bile duct. Diagnostic confidence is limited due to patient motion and breathing. No common bile duct dilatation or biliary duct dilatation. 4. Hepatic steatosis. 5. Dense bibasilar atelectasis .   Electronically Signed   By: Stewart  Edmunds M.D.   On: 01/16/2015 08:25      ASSESSMENT: NSTEMI, in the setting of hypotension and gallstone pancreatitis; syncope  PLAN:  Continue IV heparin at this time.  Given RWMA, troponin elevation may be related to plaque rupture MI.  Plan IV heparin for 48 hours. Will plan for cardiac cath today.  Postponed cath yesterday due to worsening renal function.  No Vgram.  If straightforward PCI opportunity present, would perform PCI with Bare metal stent as he may need GI procedures in the   near future.  Family h/o CAD.  HTN: BPs elevated at home at times.  COntinue antihypertensives as needed at this time.  Added beta blocker.  Now with episode of SVT, change beta blocker to metoprolol.  Of note, patient is active at home prior to having the gallstone issue.  He walks hills without any problems.  He is not reporting any preexisting cardiac sx.  Syncope: likely vagal from pain.  No significant arrhythmia on tele at this time.   Moderate renal insufficiency.  Cr better this AM.  Baseline Cr 1.5 as outpatient. Hydrate but watch for fluid overload in the setting of cardiomyopathy.  I discussed the plan with the patient and his family at length.  Cath later today.   I think his ischemia has stabilized since his Troponin levelled off.   Avoid nephrotoxins.  MRCP performed per Dr. Gessner.  No emergent GI procedures need to be done.   The patient is quite complex.  Multiple organ systems are involved.  WBC, lipase improved. Kidney function improved.  Plts decreased. Stable. Likely due to inflammatory state.  Plan BMS if PCI needed.  Dashawn Bartnick S  Emilly Lavey, MD  01/17/2015  8:25 AM    

## 2015-01-17 NOTE — Progress Notes (Signed)
RN paged earlier about pt vomiting moderate amount of dark greenish black liquid. Pt is post cath which showed severe CAD and CVTS consult is pending. He was hospitalized with acute gallstone pancreatitis and has been slowly improving. Had large BM today. Last GI note said pt could advance diet but has been NPO since cardiac cath. Will continue NPO for now. Check abd film to verify no ileus or obstruction. CMP, CBC, occult the vomitus. If abd film is neg and no further vomiting, will advance to full liquids.  Update: vomitus was discarded so no occult done. Awaiting CBC, CMP. KUB was neg for ileus or obstruction. Will advance to full liquids as long as no further vomiting. RN instructed to make pt NPO and call if any further vomiting. Will follow. Jimmye Norman, NP [facility]

## 2015-01-17 NOTE — Progress Notes (Signed)
ANTICOAGULATION CONSULT NOTE  Pharmacy Consult for heparin Indication: chest pain/ACS  No Known Allergies  Patient Measurements: Height: 6' (182.9 cm) Weight: 186 lb 11.7 oz (84.7 kg) IBW/kg (Calculated) : 77.6  Vital Signs: Temp: 98.1 F (36.7 C) (05/12 0733) Temp Source: Oral (05/12 0733) BP: 150/68 mmHg (05/12 0733) Pulse Rate: 94 (05/12 0733)  Labs:  Recent Labs  01/15/15 0159  01/16/15 0228 01/16/15 1231 01/16/15 2222 01/17/15 0249 01/17/15 0807  HGB 15.6  --  13.9  --   --  13.0  --   HCT 46.0  --  41.5  --   --  37.9*  --   PLT 83*  --  69*  --   --  61*  --   HEPARINUNFRC 0.27*  < > <0.10* 0.22* 0.14*  --  0.46  CREATININE 1.65*  --  1.92*  --   --  1.52*  --   TROPONINI 6.67*  --   --   --   --  1.47*  --   < > = values in this interval not displayed.  Assessment: 74yo male experienced syncopal event, tx'd from [facility] for acute gallstone pancreatitis, also noted w/ AKI, now w/ elevated troponin and was started on heparin. His heparin level this morning is now in therapeutic range at 0/46units/mL on 1800 units.hr. Hgb has been stable, but platelets started to fall ~24h after starting heparin. Baseline 120, now down to 61. A HIT panel was sent on 5/11. Plans for cath today. Discussed with Dr. Eldridge Dace this morning, and he is ok continuing heparin at this point and is moving forward with cath. We both agreed there is more likelihood the drop in platelets is from sepsis/inflamatory state d/t the sudden onset. However, it is unclear if the patient received subq heparin or Lovenox at [facility]. No bleeding noted.  Goal of Therapy:  Heparin level 0.3-0.7 units/ml Monitor platelets by anticoagulation protocol: Yes   Plan:  -Continue heparin at 1800 units/hr -follow up after cath today -daily HL and CBC if heparin to continue -follow for s/s bleeding  Justun Anaya D. Brook Geraci, PharmD, BCPS Clinical Pharmacist Pager: [phone] 01/17/2015 9:30 AM

## 2015-01-17 NOTE — Progress Notes (Addendum)
See first progress note from tonight. Brief summary of stay: [name] [name] was admitted by [facility] on 01/13/15 after passing out at church and c/o abd pain. Was found to have acute gallstone pancreatitis and GI has followed since admission. Also, troponins were found to be elevated and cards was consulted. + NSTEMI and troponins have flattened now. Pt has had increase in creatinine since admission which has caused some procedures to be put off or delayed. MRCP was done showing a small common bile duct stone. ERCP still has not been performed due to his cardiac status. However, on 5/10 and 11, pt had BMs and started to feel better. A KUB 2 days ago showed ? Ileus but no obstruction, but since then, pt's GI status improved somewhat overall clinically. Pt was taken for cardiac cath today which showed severe CAD and surgery consult is pending.  Tonight, RN called because s/p cath, pt began vomiting moderate amount of greenish/black liquid-no coffee grounds. At that time, KUB was r/p to r/o ileus or obstruction but was negative. No further vomiting after that for awhile. Diet had been advanced to full liqs, but before any po intake, pt began to vomit again. Occult of vomitus is pending. RN then paged NP back stating pt "didn't look well" and had vomited again x 4 total. 12 lead EKG ordered, 500cc NS bolus, and NPO status renewed. NP to bedside. S: pt says he was feeling somewhat better today until after the cardiac cath. He has vomited about 4 times and reports no po intake except for sips and chips since admission. States has had about 4 BMs over past couple of days, but per RN, these have been formed. He was having upper abd pain earlier which has subsided now after Dilaudid and Zofran. No radiation of pain. He denies any CP. He has no hx of ulcers, varices, GIB, "stomach" issues, or ETOH.  O: VS reviewed-stable. EKG reviewed-see below. Alert and oriented. Appears fatigued and weak but is not in any distress. Non-toxic  appearing. Skin is pink, warm and dry. No icterus. No jaundice. Card: RRR, not tachy. Lungs: CTA, good air exchange. No increased WOB. Abd: distended, firm, no bowel sounds heard. No pain on palpation. No guarding.  A/P: 1. Gallstone pancreatitis now with vomiting x 4 after cath-? contrast vs. narcs given with cath or associated with pancreatitis. KUB neg for OBS or ileus. I am concerned with no bowel sounds and vomiting, so will get a stat CT abd and pelvis. No IV contrast due to AKI and he had IV contrast today with cath. Will place NGT for decompression and for administration of contrast. NGT to LWS except clamp after contrast for CT. Bili has increased slightly, but LFTs normal now. Afebrile. + BMs but not formed per RN. After NGT placed, + return of brownish green liquid. Occult is pending.CBC is stable.  2. NSTEMI-concerned for cardiac status given known CAD and acute vomiting. NO CP. Troponins have flattened. EKG appears to have some ST changes compared to previous one here today. This NP called cardio, Dr. Rickard Rhymes, to discuss case. He came to see pt. See his note for his thoughts/plan.  3. AKI-stable. Got replacement of 500cc after vomiting.  Dr. Rickard Rhymes in to speak to pt. This NP updated family. Will continue to follow closely tonight. Further plan dependent on CT results and clinical progress tonight. Jimmye Norman, NP [facility] Update: CT results show only acute pancreatitis. Pt is feeling better after placement of NGT to intermittent  suction. RN reports about 600cc output. No further vomiting. Occult vomitus was positive, but H/H stable. Will make sure GI aware in am. LFTs normal. Lipase is back to normal.   Will report to oncoming attending in am.  Jimmye Norman, NP [facility]

## 2015-01-17 NOTE — Progress Notes (Signed)
[place] TEAM 1 - Stepdown/ICU TEAM Progress Note  [name] [name] WUJ:[id number] DOB: August 27, 1941 DOA: 01/13/2015 PCP: Monico Blitz, MD  Admit HPI / Brief Narrative: 74 yo WM PMHx HTN and DM Type 2 Presented to [facility] ED after a syncopal episode. He had been having persistent epigastric abdominal pain with associated nausea. The pain progressively got worse, and up to an 8/10 when he passed out. EMS noted he was bradycardic in the 30s with SBP documented in the mid 60s.He had previously been told he has a gallbladder full of stones but had been asymptomatic w/ no h/o pancreatitis.   In ED at [place] showed CBD stone and pancreatitis. Lip was >300 (no actual level). AST/ALT 247/212 - tbili 1.9 - alk phos normal. Lactic acid level 2.2 . Creat 1.7. The pt was transferred to [facility].    HPI/Subjective: 5/12 A/O 4, states negative abdominal pain, negative N/V (has only been consuming small amount Jell-O ice chips), negative CP, negative SOB; however family insists patient has been positive SOB 2 weeks  Assessment/Plan: Acute Gallstone pancreatitis/cholelithiasis  -MRCP; confirms acute pancreatitis. See results below -KUB shows possible small ileus : Counseled family would not recommend NG tube at this point unless patient began to experience significant abdominal pain, N/V.  Leukocytosis/cholecystitis/metabolic acidosis -Patient afebrile, MRCP not consistent with cholecystitis, however amylase significantly elevated, as is bilirubin obtain bilirubin coupled with patient's N//V, metabolic acidosis will error on the side of caution. -Treat for cholecystitis especially since patient requires CABG. -Zosyn per pharmacy -1 amp NaHCO3  Syncope and collapse -Initially felt to be Vagal due to pain - continue to follow on telemetry  Bradycardia  -Resolved   NSTEMI/ 3 vessel disease -IV heparin for 48 hours per Cards -S/P cardiac catheterization; showing 3 vessel disease;  spoke Dr. Jerline Pain (cardiology), waiting for  TCTS consult , CABG? -SPOKE with [place], they understand that cardiology has consult to TCTS and most likely patient will be seen tomorrow to evaluate for CABG -Strict in and out + 7.8 L -Daily weight  Acute systolic congestive heart failure -No ACE inhibitor due to acute kidney injury  - continue Coreg 3.125 mg  BID and titrate as able  HTN/Hypotension  -Patient now hypertensive -See systolic CHF  Acute kidney failure?  vs   chronic kidney failure -Unable to determine patient's baseline kidney function through review of EMR.  -Secondary to patient's poor cardiac function stopped IV resuscitation. .  DM Type 2 uncontrolled -5/8 hemoglobin A1c = 7.5  -Reasonably well controlled at the present time   Code Status: FULL Family Communication: WHOLE FAMILY present at time of exam Disposition Plan: - timing of ERCP per GI - timing of cardiac cath per cardiology    Consultants: GI [facility]    Procedure/Significant Events: TTE - 5/9 - EF 30-40% - akinesis of basal-midinferior myocardium - severe hypokinesis lateral myocardium - grade 1 DD 5/10 KUB;Mild dilatation of small bowel likely representing a small-bowel ileus. 5/10 MRCP;-Acute pancreatitis. No organized fluid collections.- Cholelithiasis without evidence of acute cholecystitis. -Potential small 4 mm filling defect within the mid common bile duct. No common bile duct dilatation or biliary duct dilatation.- Hepatic steatosis. 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 The dominant diagonal contains segmental 50% narrowing. The distal RCA and circumflex territories are not well seen by collaterals. The left ventricle is known  to have significant systolic impairment with an estimated ejection  fraction of 35% with inferior wall severe hypokinesis.  Culture   Antibiotics: Zosyn 5/12>>  DVT prophylaxis: IV heparin    Devices    LINES / TUBES:      Continuous Infusions:    Objective: VITAL SIGNS: Temp: 98.3 F (36.8 C) (05/12 1930) Temp Source: Oral (05/12 1930) BP: 131/63 mmHg (05/12 2200) Pulse Rate: 88 (05/12 2200) SPO2; FIO2:   Intake/Output Summary (Last 24 hours) at 01/17/15 2233 Last data filed at 01/17/15 1916  Gross per 24 hour  Intake   1872 ml  Output   1600 ml  Net    272 ml     Exam: General: A/O 4, NAD, No acute respiratory distress Lungs: tachypnea, Clear to auscultation bilaterally without wheezes or crackles Cardiovascular: Regular rate and rhythm without murmur gallop or rub normal S1 and S2 Abdomen: Mild generalized tenderness, nondistended, soft, bowel sounds positive, no rebound, no ascites, no appreciable mass Extremities: No significant cyanosis, clubbing, or edema bilateral lower extremities  Data Reviewed: Basic Metabolic Panel:  Recent Labs Lab 01/14/15 0122 01/15/15 0159 01/16/15 0228 01/17/15 0249  NA 139 137 138 141  K 4.0 4.1 3.8 4.5  CL 113* 113* 111 114*  CO2 19* 19* 16* 15*  GLUCOSE 203* 148* 165* 113*  BUN 24* 30* 41* 43*  CREATININE 1.54* 1.65* 1.92* 1.52*  CALCIUM 7.4* 5.8* 5.5* 5.3*  MG  --  1.4*  --   --   PHOS  --  1.7*  --   --    Liver Function Tests:  Recent Labs Lab 01/14/15 0122 01/15/15 0159 01/15/15 1130 01/16/15 1235 01/17/15 0249  AST 162* 67*  --  35 37  ALT 224* 106*  --  56 49  ALKPHOS 63 42  --  45 58  BILITOT 2.5* 2.7* 2.6* 2.5* 2.0*  PROT 5.8* 5.2*  --  5.0* 5.2*  ALBUMIN 2.9* 2.5*  --  2.1* 2.0*    Recent Labs Lab 01/13/15 2012 01/15/15 0159 01/15/15 1130  LIPASE 1230* 474*  --   AMYLASE  --   --  978*   No results for input(s): AMMONIA in the last 168 hours. CBC:  Recent Labs Lab 01/14/15 0816 01/15/15 0159 01/16/15 0228 01/17/15 0249 01/17/15 2143    WBC 17.2* 18.2* 14.4* 14.9* 15.2*  HGB 16.8 15.6 13.9 13.0 13.9  HCT 47.6 46.0 41.5 37.9* 40.1  MCV 87.8 89.7 89.8 89.6 88.9  PLT 119* 83* 69* 61* 105*   Cardiac Enzymes:  Recent Labs Lab 01/13/15 2012 01/14/15 0122 01/14/15 0816 01/15/15 0159 01/17/15 0249  CKTOTAL  --   --  437*  --   --   CKMB  --   --  40.7*  --   --   TROPONINI 2.48* 3.94* 6.57*  6.48* 6.67* 1.47*   BNP (last 3 results) No results for input(s): BNP in the last 8760 hours.  ProBNP (last 3 results) No results for input(s): PROBNP in the last 8760 hours.  CBG:  Recent Labs Lab 01/17/15 0414 01/17/15 0732 01/17/15 1232 01/17/15 1915 01/17/15 2128  GLUCAP 112* 128* 130* 164* 170*    Recent Results (from the past 240 hour(s))  MRSA PCR Screening     Status: None   Collection Time: 01/13/15  6:33 PM  Result Value Ref Range Status   MRSA by PCR NEGATIVE NEGATIVE Final    Comment:        The GeneXpert MRSA  Assay (FDA approved for NASAL specimens only), is one component of a comprehensive MRSA colonization surveillance program. It is not intended to diagnose MRSA infection nor to guide or monitor treatment for MRSA infections.      Studies:  Recent x-ray studies have been reviewed in detail by the Attending Physician  Scheduled Meds:  Scheduled Meds: . antiseptic oral rinse  7 mL Mouth Rinse q12n4p  . aspirin  81 mg Oral Daily  . carvedilol  3.125 mg Oral BID WC  . chlorhexidine  15 mL Mouth Rinse BID  . heparin  5,000 Units Subcutaneous 3 times per day  . insulin aspart  0-9 Units Subcutaneous 6 times per day  . latanoprost  1 drop Both Eyes QHS  . sodium chloride  3 mL Intravenous Q12H  . zolpidem  5 mg Oral Once    Time spent on care of this patient: 40 mins   WOODS, Geraldo Docker , MD  [facility] Office  [phone] Pager - [phone]  On-Call/Text Page:      [email or web address]      password [email or web address]  If 7PM-7AM, please contact night-coverage [email or web address] Password  [email or web address] 01/17/2015, 10:33 PM   LOS: 4 days   Care during the described time interval was provided by me .  I have reviewed this patient's available data, including medical history, events of note, physical examination, radiology studies and test results as part of my evaluation  Dia Crawford, MD [phone] Pager

## 2015-01-17 NOTE — Significant Event (Signed)
Paged re: ECG  Pt underwent cardiac cath today demostrating totaled RCA/LCx with prox LAD disease. After shift change he developed band like abdominal pain, nausea, and dark brown/black emesis without obvious coffee grounds. No CP or pressure.   ECG was obtained (10:31) demonstrated NSR, IVCD, and increased lateral STD compared to prior earlier today. ECG earlier in the day (18:16) notably had more prominent anterior T waves. Other previous ECGs from this admission demonstrate both prominent anterior T waves and similar lateral STD.  We have no pre-hospitalization ECGs to compare so I am not exactly sure what his baseline is.   On exam, he was hemodynamically stable. Looks tired but not overt distressed. Heart regular with no mrg. abd was distended and tender. No bowel sounds. An NGT had been placed and was draining dark brown/black fluid.   Medicine is going to obtain abd CT scan (PO contrast only) to eval abdomen  Impression 1. Clinical picture c/w worsened ileus in setting of gallstone pancreatitis 2. No symptoms c/w ischemia although he did not have CP on presentation. His ECG is changed compared to earlier in the day although his true baseline is unclear. Will need to get serial ECGs to get a sense of trajectory.   Recs 1. management of N/V and abd imaging per medicine 2. Would perform gastric occult testing on vomit 3. Please check repeat ECG; if findings are stable and pt is hemodynamically stable and CP free, will continue to monitor conservatively.

## 2015-01-17 NOTE — Consult Note (Signed)
ANTIBIOTIC CONSULT NOTE  Pharmacy Consult for  Zosyn Indication:  Cholecystitis  No Known Allergies  Patient Measurements: Height: 6' (182.9 cm) Weight: 186 lb 11.7 oz (84.7 kg) IBW/kg (Calculated) : 77.6  Vital Signs: Temp: 98.3 F (36.8 C) (05/12 1930) Temp Source: Oral (05/12 1930) BP: 131/63 mmHg (05/12 2200) Pulse Rate: 88 (05/12 2200)  Labs:  Recent Labs  01/15/15 0159 01/16/15 0228 01/17/15 0249 01/17/15 2143  WBC 18.2* 14.4* 14.9* 15.2*  HGB 15.6 13.9 13.0 13.9  PLT 83* 69* 61* 105*  CREATININE 1.65* 1.92* 1.52*  --     Estimated Creatinine Clearance: 48.2 mL/min (by C-G formula based on Cr of 1.52).  Microbiology: Recent Results (from the past 720 hour(s))  MRSA PCR Screening     Status: None   Collection Time: 01/13/15  6:33 PM  Result Value Ref Range Status   MRSA by PCR NEGATIVE NEGATIVE Final    Comment:        The GeneXpert MRSA Assay (FDA approved for NASAL specimens only), is one component of a comprehensive MRSA colonization surveillance program. It is not intended to diagnose MRSA infection nor to guide or monitor treatment for MRSA infections.     Medical History: Past Medical History  Diagnosis Date  . Hypertension   . Diabetes mellitus type 2 in obese   . Cholelithiasis     History reviewed. No pertinent past surgical history.  Current Medication[s] Include: Prior to Admission: Prescriptions prior to admission  Medication Sig Dispense Refill Last Dose  . atorvastatin (LIPITOR) 10 MG tablet Take 10 mg by mouth daily.   01/12/2015 at Unknown time  . glipiZIDE (GLUCOTROL XL) 10 MG 24 hr tablet Take 10 mg by mouth 2 (two) times daily.   01/12/2015 at Unknown time  . latanoprost (XALATAN) 0.005 % ophthalmic solution Place 1 drop into both eyes at bedtime.   01/12/2015 at Unknown time  . lisinopril-hydrochlorothiazide (PRINZIDE,ZESTORETIC) 20-12.5 MG per tablet Take 1 tablet by mouth daily.   01/13/2015 at Unknown time  . sildenafil  (VIAGRA) 100 MG tablet Take 100 mg by mouth daily as needed for erectile dysfunction.   unknown  . sitaGLIPtin (JANUVIA) 100 MG tablet Take 100 mg by mouth daily.   01/12/2015 at Unknown time  . vardenafil (LEVITRA) 20 MG tablet Take 20 mg by mouth daily as needed for erectile dysfunction.   unknown   Scheduled:  Scheduled:  . antiseptic oral rinse  7 mL Mouth Rinse q12n4p  . aspirin  81 mg Oral Daily  . carvedilol  3.125 mg Oral BID WC  . chlorhexidine  15 mL Mouth Rinse BID  . heparin  5,000 Units Subcutaneous 3 times per day  . insulin aspart  0-9 Units Subcutaneous 6 times per day  . latanoprost  1 drop Both Eyes QHS  . piperacillin-tazobactam  3.375 g Intravenous STAT  . [START ON 01/18/2015] piperacillin-tazobactam  3.375 g Intravenous Q8H  . sodium chloride  3 mL Intravenous Q12H  . zolpidem  5 mg Oral Once   Infusion[s]: Infusions:   Antibiotic[s]: Anti-infectives    Start     Dose/Rate Route Frequency Ordered Stop   01/18/15 0800  piperacillin-tazobactam (ZOSYN) IVPB 3.375 g     3.375 g 12.5 mL/hr over 240 Minutes Intravenous Every 8 hours 01/17/15 2237     01/17/15 2245  piperacillin-tazobactam (ZOSYN) IVPB 3.375 g     3.375 g 100 mL/hr over 30 Minutes Intravenous STAT 01/17/15 2236 01/18/15 2245  Assessment:  74 y/o male to be started on Zosyn for cholecystitis.  WBC 15.2, afebrile.  CrCl ~ 48 ml/min.  Goal of Therapy:  Zosyn dosed for clinical indication and adjusted, if required, for renal function.  Plan: 1. Give Zosyn 3.375 gm IV now, then  2. Begin Zosyn 3.375 gm IV q 8 hours, each dose to infuse over 4 hours 3. Follow up SCr, UOP, cultures, clinical course and adjust as clinically indicated.   Caeden Foots, Elisha Headland,  Pharm.D     01/17/2015,10:38 PM

## 2015-01-17 NOTE — Progress Notes (Signed)
CRITICAL VALUE ALERT  Critical value received:  Ca++ 5.3  Date of notification:  01/17/2015   Time of notification:  0506  Critical value read back:Yes.    Nurse who received alert:  Everette Rank, RN  MD notified (1st page):  Donnamarie Poag  Time of first page:  0511  MD notified (2nd page): Donnamarie Poag    Time of second page:  Responding MD:  0600   Time MD responded:  n/a

## 2015-01-18 ENCOUNTER — Inpatient Hospital Stay (HOSPITAL_COMMUNITY): Payer: Medicare Other

## 2015-01-18 ENCOUNTER — Other Ambulatory Visit: Payer: Self-pay

## 2015-01-18 ENCOUNTER — Encounter (HOSPITAL_COMMUNITY): Payer: Self-pay | Admitting: Interventional Cardiology

## 2015-01-18 DIAGNOSIS — I429 Cardiomyopathy, unspecified: Secondary | ICD-10-CM

## 2015-01-18 DIAGNOSIS — I214 Non-ST elevation (NSTEMI) myocardial infarction: Secondary | ICD-10-CM

## 2015-01-18 DIAGNOSIS — I2510 Atherosclerotic heart disease of native coronary artery without angina pectoris: Secondary | ICD-10-CM

## 2015-01-18 LAB — COMPREHENSIVE METABOLIC PANEL
ALBUMIN: 2.1 g/dL — AB (ref 3.5–5.0)
ALT: 49 U/L (ref 17–63)
AST: 36 U/L (ref 15–41)
Alkaline Phosphatase: 52 U/L (ref 38–126)
Anion gap: 13 (ref 5–15)
BUN: 42 mg/dL — AB (ref 6–20)
CHLORIDE: 111 mmol/L (ref 101–111)
CO2: 20 mmol/L — ABNORMAL LOW (ref 22–32)
Calcium: 6 mg/dL — CL (ref 8.9–10.3)
Creatinine, Ser: 1.52 mg/dL — ABNORMAL HIGH (ref 0.61–1.24)
GFR calc Af Amer: 51 mL/min — ABNORMAL LOW (ref >60)
GFR, EST NON AFRICAN AMERICAN: 44 mL/min — AB (ref >60)
GLUCOSE: 204 mg/dL — AB (ref 65–99)
POTASSIUM: 3.3 mmol/L — AB (ref 3.5–5.1)
Sodium: 144 mmol/L (ref 135–145)
TOTAL PROTEIN: 5.1 g/dL — AB (ref 6.5–8.1)
Total Bilirubin: 2.4 mg/dL — ABNORMAL HIGH (ref 0.3–1.2)

## 2015-01-18 LAB — BLOOD GAS, ARTERIAL
Acid-base deficit: 3.1 mmol/L — ABNORMAL HIGH (ref 0.0–2.0)
BICARBONATE: 20.4 meq/L (ref 20.0–24.0)
Drawn by: 252031
O2 CONTENT: 4 L/min
O2 Saturation: 97.5 %
Patient temperature: 98.6
TCO2: 21.4 mmol/L (ref 0–100)
pCO2 arterial: 31.2 mmHg — ABNORMAL LOW (ref 35.0–45.0)
pH, Arterial: 7.432 (ref 7.350–7.450)
pO2, Arterial: 98.6 mmHg (ref 80.0–100.0)

## 2015-01-18 LAB — CBC WITH DIFFERENTIAL/PLATELET
Basophils Absolute: 0 10*3/uL (ref 0.0–0.1)
Basophils Relative: 0 % (ref 0–1)
Eosinophils Absolute: 0 10*3/uL (ref 0.0–0.7)
Eosinophils Relative: 0 % (ref 0–5)
HCT: 39.2 % (ref 39.0–52.0)
HEMOGLOBIN: 13.4 g/dL (ref 13.0–17.0)
Lymphocytes Relative: 6 % — ABNORMAL LOW (ref 12–46)
Lymphs Abs: 0.9 10*3/uL (ref 0.7–4.0)
MCH: 30.2 pg (ref 26.0–34.0)
MCHC: 34.2 g/dL (ref 30.0–36.0)
MCV: 88.3 fL (ref 78.0–100.0)
Monocytes Absolute: 1.2 10*3/uL — ABNORMAL HIGH (ref 0.1–1.0)
Monocytes Relative: 8 % (ref 3–12)
NEUTROS ABS: 13 10*3/uL — AB (ref 1.7–7.7)
Neutrophils Relative %: 86 % — ABNORMAL HIGH (ref 43–77)
Platelets: 111 10*3/uL — ABNORMAL LOW (ref 150–400)
RBC: 4.44 MIL/uL (ref 4.22–5.81)
RDW: 13.7 % (ref 11.5–15.5)
WBC: 15.1 10*3/uL — ABNORMAL HIGH (ref 4.0–10.5)

## 2015-01-18 LAB — GLUCOSE, CAPILLARY
GLUCOSE-CAPILLARY: 159 mg/dL — AB (ref 65–99)
GLUCOSE-CAPILLARY: 198 mg/dL — AB (ref 65–99)
Glucose-Capillary: 182 mg/dL — ABNORMAL HIGH (ref 65–99)
Glucose-Capillary: 179 mg/dL — ABNORMAL HIGH (ref 65–99)
Glucose-Capillary: 150 mg/dL — ABNORMAL HIGH (ref 65–99)
Glucose-Capillary: 187 mg/dL — ABNORMAL HIGH (ref 65–99)

## 2015-01-18 LAB — OCCULT BLOOD GASTRIC / DUODENUM (SPECIMEN CUP): OCCULT BLOOD, GASTRIC: POSITIVE — AB

## 2015-01-18 LAB — AMYLASE: AMYLASE: 121 U/L — AB (ref 28–100)

## 2015-01-18 LAB — LIPASE, BLOOD: LIPASE: 35 U/L (ref 22–51)

## 2015-01-18 LAB — MAGNESIUM: MAGNESIUM: 1.9 mg/dL (ref 1.7–2.4)

## 2015-01-18 MED ORDER — POTASSIUM CHLORIDE 10 MEQ/100ML IV SOLN
10.0000 meq | INTRAVENOUS | Status: AC
  Administered 2015-01-18 (×3): 10 meq via INTRAVENOUS
  Filled 2015-01-18 (×3): qty 100

## 2015-01-18 MED ORDER — ZOLPIDEM TARTRATE 5 MG PO TABS
5.0000 mg | ORAL_TABLET | Freq: Every evening | ORAL | Status: DC | PRN
  Administered 2015-01-18: 5 mg via ORAL
  Filled 2015-01-18: qty 1

## 2015-01-18 MED ORDER — SODIUM CHLORIDE 0.9 % IV SOLN
INTRAVENOUS | Status: DC
  Administered 2015-01-18 – 2015-01-19 (×2): via INTRAVENOUS

## 2015-01-18 MED ORDER — SODIUM CHLORIDE 0.9 % IV SOLN
2.0000 g | Freq: Once | INTRAVENOUS | Status: AC
  Administered 2015-01-18: 21:00:00 2 g via INTRAVENOUS
  Filled 2015-01-18: qty 20

## 2015-01-18 MED ORDER — POTASSIUM CHLORIDE 10 MEQ/100ML IV SOLN
10.0000 meq | INTRAVENOUS | Status: AC
  Administered 2015-01-18 (×3): 10 meq via INTRAVENOUS
  Filled 2015-01-18: qty 100

## 2015-01-18 MED ORDER — CARVEDILOL 6.25 MG PO TABS
6.2500 mg | ORAL_TABLET | Freq: Two times a day (BID) | ORAL | Status: DC
  Administered 2015-01-18 – 2015-01-21 (×6): 6.25 mg via ORAL
  Filled 2015-01-18 (×6): qty 1
  Filled 2015-01-18 (×2): qty 2

## 2015-01-18 MED ORDER — HYDROMORPHONE HCL 1 MG/ML IJ SOLN: 0.5000 mg | Freq: Once | INTRAMUSCULAR | Status: DC

## 2015-01-18 MED ORDER — METOCLOPRAMIDE HCL 5 MG/ML IJ SOLN
10.0000 mg | Freq: Once | INTRAMUSCULAR | Status: AC
  Administered 2015-01-18: 10 mg via INTRAVENOUS
  Filled 2015-01-18: qty 2

## 2015-01-18 MED ORDER — IOHEXOL 300 MG/ML  SOLN: 25.0000 mL | INTRAMUSCULAR | Status: DC

## 2015-01-18 MED FILL — Heparin Sodium (Porcine) 2 Unit/ML in Sodium Chloride 0.9%: INTRAMUSCULAR | Qty: 1000 | Status: AC

## 2015-01-18 MED FILL — Lidocaine HCl Local Preservative Free (PF) Inj 1%: INTRAMUSCULAR | Qty: 30 | Status: AC

## 2015-01-18 NOTE — Progress Notes (Signed)
Daily Rounding Note  01/18/2015, 1:57 PM  LOS: 5 days   SUBJECTIVE:       Yesterday post cath, had worsening abdominal pain and dark (not CG) emesis. Some EKG changes.  CT repeated  Shows acutepancreatitis no fluid collection. Bile duct not dialted. No bowel obstruction.   NGT was placed overnight. NGT output: 1200 cc last night, 900 cc today.  6 mg dilaudid yesterday, 2 mg today.   Pt denies abd pain, cp, sob.  IVF stopped, so no IVF running with NGT to LIS.  Did get 400 cc bolus last night after vomitting x 4.  BM last night and this AM.       OBJECTIVE:         Vital signs in last 24 hours:    Temp:  [97.4 F (36.3 C)-98.4 F (36.9 C)] 98.4 F (36.9 C) (05/13 0745) Pulse Rate:  [70-96] 78 (05/13 1308) Resp:  [19-36] 19 (05/13 0002) BP: (131-187)/(61-86) 171/80 mmHg (05/13 1308) SpO2:  [94 %-98 %] 98 % (05/13 1308) Weight:  [183 lb 10.3 oz (83.3 kg)] 183 lb 10.3 oz (83.3 kg) (05/13 0500) Last BM Date: 01/17/15 Filed Weights   01/17/15 0400 01/18/15 0002 01/18/15 0500  Weight: 186 lb 11.7 oz (84.7 kg) 183 lb 10.3 oz (83.3 kg) 183 lb 10.3 oz (83.3 kg)   General: looks ill and sweaty   Heart: RRR Chest: clear but loose/congested cough Abdomen: soft, slight distention.  Not tender.  No tympanitic BS but BS are scant   Extremities: no CCE Neuro/Psych:  Pleasant, alert, comfortable. Not confused.   Intake/Output from previous day: 05/12 0701 - 05/13 0700 In: 350 [IV Piggyback:350] Out: 3000 [Urine:1800; Emesis/NG output:1200]  Intake/Output this shift: Total I/O In: 620 [P.O.:620] Out: 1000 [Urine:100; Emesis/NG output:900]  Lab Results:  Recent Labs  01/17/15 0249 01/17/15 2143 01/18/15 0237  WBC 14.9* 15.2* 15.1*  HGB 13.0 13.9 13.4  HCT 37.9* 40.1 39.2  PLT 61* 105* 111*   BMET  Recent Labs  01/17/15 0249 01/17/15 2143 01/18/15 0237  NA 141 143 144  K 4.5 3.2* 3.3*  CL 114* 109 111  CO2  15* 17* 20*  GLUCOSE 113* 187* 204*  BUN 43* 42* 42*  CREATININE 1.52* 1.52* 1.52*  CALCIUM 5.3* 5.9* 6.0*   LFT  Recent Labs  01/16/15 1235 01/17/15 0249 01/17/15 2143 01/18/15 0237  PROT 5.0* 5.2* 5.4* 5.1*  ALBUMIN 2.1* 2.0* 2.2* 2.1*  AST 35 37 38 36  ALT 56 49 52 49  ALKPHOS 45 58 62 52  BILITOT 2.5* 2.0* 2.4* 2.4*  BILIDIR 0.7*  --   --   --   IBILI 1.8*  --   --   --    PT/INR No results for input(s): LABPROT, INR in the last 72 hours. Hepatitis Panel No results for input(s): HEPBSAG, HCVAB, HEPAIGM, HEPBIGM in the last 72 hours.  Studies/Results: Ct Abdomen Pelvis Wo Contrast  01/18/2015   CLINICAL DATA:  Nausea and vomiting after cardiac catheterization yesterday.  EXAM: CT ABDOMEN AND PELVIS WITHOUT CONTRAST  TECHNIQUE: Multidetector CT imaging of the abdomen and pelvis was performed following the standard protocol without IV contrast.  COMPARISON:  Abdomen 01/17/2015  FINDINGS: Bilateral pleural effusions with basilar atelectasis. Coronary artery calcifications. Residual contrast material in the esophagus may represent reflux or dysmotility.  Cholelithiasis with multiple small stones in the gallbladder. No gallbladder wall thickening. Stones are present in the common bile  duct. No significant bile duct dilatation. There is diffuse inflammatory infiltration and edema in the fat around the pancreas consistent with acute pancreatitis. Calcifications in the head of the pancreas may represent stones or postinflammatory calcifications. No loculated fluid collection is demonstrated to suggest abscess.  The unenhanced appearance of the liver, spleen, adrenal glands, inferior vena cava, and retroperitoneal lymph nodes is unremarkable. Calcification of the aorta without aneurysm. Bilateral low-attenuation renal lesions likely representing cyst but too small to characterize. No hydronephrosis in either stomach, small bowel, and colon are not abnormally distended. There is suggestion of  wall thickening in some of the small bowel loops possibly indicating enteritis. Kidney. Small amount of free fluid in the abdomen and pelvis. No free air.  Pelvis: Appendix is normal. Bladder wall is not thickened. Prostate gland is not enlarged. No pelvic mass or lymphadenopathy. Degenerative changes in the spine. No destructive bone lesions. Diffuse subcutaneous soft tissue edema.  IMPRESSION: Infiltration edema around the pancreas with fluid in the pericolic gutters and pelvis. Changes are consistent with acute pancreatitis. No abscess identified. Cholelithiasis with stones appearing in the common bile duct. No bile duct dilatation. Bilateral pleural effusions with basilar atelectasis.   Electronically Signed   By: Burman Nieves M.D.   On: 01/18/2015 03:22   US Renal  01/16/2015   CLINICAL DATA:  Acute renal failure  EXAM: RENAL / URINARY TRACT ULTRASOUND COMPLETE  COMPARISON:  None.  FINDINGS: Right Kidney:  Length: 11.4 cm. Echogenicity within normal limits. No mass or hydronephrosis visualized.  Left Kidney:  Length: 11.9 cm. Echogenicity within normal limits. No mass or hydronephrosis visualized.  Bladder:  Appears normal for degree of bladder distention.  Small amount of ascites.  IMPRESSION: Normal renal ultrasound.   Electronically Signed   By: Amie Portland M.D.   On: 01/16/2015 15:58   Dg Chest Port 1 View  01/18/2015   CLINICAL DATA:  Shortness of breath.  EXAM: PORTABLE CHEST - 1 VIEW  COMPARISON:  None.  FINDINGS: NG tube noted with tip below left hemidiaphragm. Mediastinum and hilar structures are normal. Heart size normal. Bilateral pulmonary infiltrates and pleural effusions noted. No pneumothorax. No acute bony abnormality .  IMPRESSION: 1. NG tube noted with tip below left hemidiaphragm. 2. Bilateral prominent basilar pulmonary infiltrates. These changes could be related to pulmonary edema and/or pneumonia. Small bilateral pleural effusions.   Electronically Signed   By: Maisie Fus  Register    On: 01/18/2015 07:25   Dg Abd Portable 1v  01/17/2015   CLINICAL DATA:  Abdominal pain for 3 days, vomiting for 1 day  EXAM: PORTABLE ABDOMEN - 1 VIEW  COMPARISON:  01/15/2015  FINDINGS: The bowel gas pattern is normal. No radio-opaque calculi or other significant radiographic abnormality are seen. Some colonic gas is noted in right colon and mid transverse colon without significant distension  IMPRESSION: Negative.   Electronically Signed   By: Natasha Mead M.D.   On: 01/17/2015 19:51   Scheduled Meds: . antiseptic oral rinse  7 mL Mouth Rinse q12n4p  . aspirin  81 mg Oral Daily  . carvedilol  3.125 mg Oral BID WC  . chlorhexidine  15 mL Mouth Rinse BID  . heparin  5,000 Units Subcutaneous 3 times per day  . insulin aspart  0-9 Units Subcutaneous 6 times per day  . latanoprost  1 drop Both Eyes QHS  . piperacillin-tazobactam  3.375 g Intravenous Q8H  . sodium chloride  3 mL Intravenous Q12H  . zolpidem  5 mg Oral Once   Continuous Infusions:  PRN Meds:.sodium chloride, acetaminophen, hydrALAZINE, HYDROmorphone (DILAUDID) injection, ondansetron (ZOFRAN) IV, oxyCODONE-acetaminophen, sodium chloride   ASSESMENT:   *  NSTEMI.  CHF.  Cardiac scath 01/17/15: Severe batuve vessek CAD. Total RCA, total prox cfx, severe prox LAD, 50% dz of dominant diagonal. EF 35% with severe inferior hypokinesis. No role for PCI given the anatomy.  On subq Heparin.  Dr Donata Clay says CABG in several weeks, after pancreatitis recovery.   *  Acute biliary pancreatitis. Choledocholithiasis. MRCP not convincing for CBD stone ("potential 4 mm filling defect") CT this AM with CBD stone but not obstructing/ no ductal dilatation.  Lipase and LFTs  Mostly normalized but T bil remains mild-moderately elevated.    *  Bibasilar infiltrates and ? Edema vs pna.  Looks to me more like pna, may have aspirated.  sats of 97 to 98%.  Zosyn has been in place for last several days for "cholecystitis.    *  SB ileus Dark  emesis:  GOB + but no acute Hgb drop or anemia. .    *  ARF.  Stable BUN/Creat. Kidneys normal on ultrasound.   *  Thrombocytopenia. Improved.   *  Type 2 DM    PLAN    *  Dose of reglan 10 mg IV  *  Stopped oxycodone, stop Dilaudid. .   *  I feel pt needs maintenance IVF but will let medical team oversee this.    *  Will get kub in AM.  Leave ngt in place for now.       [name] [name]  01/18/2015, 1:57 PM Pager: [phone]   [facility] Attending  I have also seen and assessed the patient and agree with the advanced practitioner's assessment and plan. Hx and PE same. Situation reviewed w/ patient and daughter.  Needs coronary revascularization.  Does have calcified CBD stones and effects of acute pancreatitis on last CT.  Ileus seems better. Kdiney fx improving.  Does not need Abx - got 1 dose Zosyn  TPN ordered by me - makes sense Ca i checked  He may need rehab stay - not sure -   If ok tomorrow w/oN/V and xray ok dc NGT  Best sequence of events we can hope for seems  1) Recovers from this w/o recurrent CBD stone, gallstone issues 2) CABG 3) Recovers 4) ERCP 5) cholecystectomy  This will play out over months  Dr. Loreta Ave to check over w/e   Iva Boop, MD, [facility] [phone] (pager) 01/18/2015 7:29 PM

## 2015-01-18 NOTE — Progress Notes (Signed)
1 Day Post-Op Procedure(s) (LRB): Left Heart Cath and Coronary Angiography (N/A) Subjective:  patient examined and cath, echocardiogram reviewed Situation d/w patients family 74 yo diabetic with 3 v CAD, moderate LV dysfunction- would be a candidate for CABG later in several weeks after  recovery from gallstone pancreatitis He is currently stable post NSTEMI- if he becomes unstable in the short term he would need PCI of LAD- emerg CABG with ongoing pancreatitis wouldnot benefit there patient  Objective: Vital signs in last 24 hours: Temp:  [97.4 F (36.3 C)-98.4 F (36.9 C)] 98.4 F (36.9 C) (05/13 0745) Pulse Rate:  [70-96] 78 (05/13 1308) Cardiac Rhythm:  [-] Normal sinus rhythm (05/13 0800) Resp:  [19-36] 19 (05/13 0002) BP: (131-187)/(61-88) 185/88 mmHg (05/13 1402) SpO2:  [94 %-98 %] 98 % (05/13 1308) Weight:  [183 lb 10.3 oz (83.3 kg)] 183 lb 10.3 oz (83.3 kg) (05/13 0500)  Hemodynamic parameters for last 24 hours:   nsr Intake/Output from previous day: 05/12 0701 - 05/13 0700 In: 350 [IV Piggyback:350] Out: 3000 [Urine:1800; Emesis/NG output:1200] Intake/Output this shift: Total I/O In: 620 [P.O.:620] Out: 1000 [Urine:100; Emesis/NG output:900]  Alert abd distended No murmur  Lab Results:  Recent Labs  01/17/15 2143 01/18/15 0237  WBC 15.2* 15.1*  HGB 13.9 13.4  HCT 40.1 39.2  PLT 105* 111*   BMET:  Recent Labs  01/17/15 2143 01/18/15 0237  NA 143 144  K 3.2* 3.3*  CL 109 111  CO2 17* 20*  GLUCOSE 187* 204*  BUN 42* 42*  CREATININE 1.52* 1.52*  CALCIUM 5.9* 6.0*    PT/INR: No results for input(s): LABPROT, INR in the last 72 hours. ABG    Component Value Date/Time   PHART 7.432 01/18/2015 0533   HCO3 20.4 01/18/2015 0533   TCO2 21.4 01/18/2015 0533   ACIDBASEDEF 3.1* 01/18/2015 0533   O2SAT 97.5 01/18/2015 0533   CBG (last 3)   Recent Labs  01/18/15 0418 01/18/15 0745 01/18/15 1239  GLUCAP 182* 179* 150*     Assessment/Plan: S/P Procedure(s) (LRB): Left Heart Cath and Coronary Angiography (N/A) Plan CABG later rec TNA for nutrition while he has acute pancreatitis   LOS: 5 days    Kathlee Nations Trigt III 01/18/2015

## 2015-01-18 NOTE — Progress Notes (Signed)
SUBJECTIVE:  No chest pain.  Abdomen feels better today.  He had another bowel movement today.  Cath revealed 3 vessel disease.  Had some SHOB last night with NG tube placement.  Feels hungry.  OBJECTIVE:   Vitals:   Filed Vitals:   01/18/15 0100 01/18/15 0200 01/18/15 0500 01/18/15 0745  BP: 165/67 154/86  172/82  Pulse: 85 89  70  Temp:    98.4 F (36.9 C)  TempSrc:    Axillary  Resp:      Height:      Weight:   183 lb 10.3 oz (83.3 kg)   SpO2: 97% 97%  98%   I&O's:    Intake/Output Summary (Last 24 hours) at 01/18/15 1019 Last data filed at 01/18/15 0912  Gross per 24 hour  Intake    350 ml  Output   3100 ml  Net  -2750 ml   TELEMETRY: Reviewed telemetry pt in NSR:     PHYSICAL EXAM General: Well developed, well nourished, in no acute distress Head:   Normal cephalic and atramatic  Lungs:   Clear bilaterally to auscultation. Heart:   HRRR S1 S2  No JVD.   Abdomen: abdomen soft, less tender, no rebound, mildly obese Msk:  Back normal,  Normal strength and tone for age. Extremities:   Swollen fingers.   Neuro: Alert and oriented. Psych:  Normal affect, responds appropriately Skin: No rash; redness of the face and upper chest   LABS: Basic Metabolic Panel:  Recent Labs  16/10/96 2143 01/18/15 0237  NA 143 144  K 3.2* 3.3*  CL 109 111  CO2 17* 20*  GLUCOSE 187* 204*  BUN 42* 42*  CREATININE 1.52* 1.52*  CALCIUM 5.9* 6.0*  MG  --  1.9   Liver Function Tests:  Recent Labs  01/17/15 2143 01/18/15 0237  AST 38 36  ALT 52 49  ALKPHOS 62 52  BILITOT 2.4* 2.4*  PROT 5.4* 5.1*  ALBUMIN 2.2* 2.1*    Recent Labs  01/15/15 1130 01/18/15 0237  LIPASE  --  35  AMYLASE 978* 121*   CBC:  Recent Labs  01/17/15 2143 01/18/15 0237  WBC 15.2* 15.1*  NEUTROABS  --  13.0*  HGB 13.9 13.4  HCT 40.1 39.2  MCV 88.9 88.3  PLT 105* 111*   Cardiac Enzymes:  Recent Labs  01/17/15 0249  TROPONINI 1.47*   BNP: Invalid input(s):  POCBNP D-Dimer: No results for input(s): DDIMER in the last 72 hours. Hemoglobin A1C: No results for input(s): HGBA1C in the last 72 hours. Fasting Lipid Panel: No results for input(s): CHOL, HDL, LDLCALC, TRIG, CHOLHDL, LDLDIRECT in the last 72 hours. Thyroid Function Tests: No results for input(s): TSH, T4TOTAL, T3FREE, THYROIDAB in the last 72 hours.  Invalid input(s): FREET3 Anemia Panel: No results for input(s): VITAMINB12, FOLATE, FERRITIN, TIBC, IRON, RETICCTPCT in the last 72 hours. Coag Panel:   Lab Results  Component Value Date   INR 1.19 01/14/2015    RADIOLOGY: Ct Abdomen Pelvis Wo Contrast  01/18/2015   CLINICAL DATA:  Nausea and vomiting after cardiac catheterization yesterday.  EXAM: CT ABDOMEN AND PELVIS WITHOUT CONTRAST  TECHNIQUE: Multidetector CT imaging of the abdomen and pelvis was performed following the standard protocol without IV contrast.  COMPARISON:  Abdomen 01/17/2015  FINDINGS: Bilateral pleural effusions with basilar atelectasis. Coronary artery calcifications. Residual contrast material in the esophagus may represent reflux or dysmotility.  Cholelithiasis with multiple small stones in the gallbladder. No gallbladder wall thickening.  Stones are present in the common bile duct. No significant bile duct dilatation. There is diffuse inflammatory infiltration and edema in the fat around the pancreas consistent with acute pancreatitis. Calcifications in the head of the pancreas may represent stones or postinflammatory calcifications. No loculated fluid collection is demonstrated to suggest abscess.  The unenhanced appearance of the liver, spleen, adrenal glands, inferior vena cava, and retroperitoneal lymph nodes is unremarkable. Calcification of the aorta without aneurysm. Bilateral low-attenuation renal lesions likely representing cyst but too small to characterize. No hydronephrosis in either stomach, small bowel, and colon are not abnormally distended. There is  suggestion of wall thickening in some of the small bowel loops possibly indicating enteritis. Kidney. Small amount of free fluid in the abdomen and pelvis. No free air.  Pelvis: Appendix is normal. Bladder wall is not thickened. Prostate gland is not enlarged. No pelvic mass or lymphadenopathy. Degenerative changes in the spine. No destructive bone lesions. Diffuse subcutaneous soft tissue edema.  IMPRESSION: Infiltration edema around the pancreas with fluid in the pericolic gutters and pelvis. Changes are consistent with acute pancreatitis. No abscess identified. Cholelithiasis with stones appearing in the common bile duct. No bile duct dilatation. Bilateral pleural effusions with basilar atelectasis.   Electronically Signed   By: Burman Nieves M.D.   On: 01/18/2015 03:22   Dg Abd 1 View  01/15/2015   CLINICAL DATA:  Ileus  EXAM: ABDOMEN - 1 VIEW  COMPARISON:  None.  FINDINGS: Scattered large and small bowel gas is noted. Mildly dilated loops of small bowel are to in the mid abdomen. No free air is noted. No definitive obstructive changes are seen.  IMPRESSION: Mild dilatation of small bowel likely representing a small-bowel ileus. Correlation with the physical exam is recommended.   Electronically Signed   By: Alcide Clever M.D.   On: 01/15/2015 09:18   US Renal  01/16/2015   CLINICAL DATA:  Acute renal failure  EXAM: RENAL / URINARY TRACT ULTRASOUND COMPLETE  COMPARISON:  None.  FINDINGS: Right Kidney:  Length: 11.4 cm. Echogenicity within normal limits. No mass or hydronephrosis visualized.  Left Kidney:  Length: 11.9 cm. Echogenicity within normal limits. No mass or hydronephrosis visualized.  Bladder:  Appears normal for degree of bladder distention.  Small amount of ascites.  IMPRESSION: Normal renal ultrasound.   Electronically Signed   By: Amie Portland M.D.   On: 01/16/2015 15:58   Mr Abdomen Mrcp Wo Cm  01/16/2015   CLINICAL DATA:  Cholelithiasis with abdominal pain.  EXAM: MRI ABDOMEN WITHOUT  CONTRAST  (INCLUDING MRCP)  TECHNIQUE: Multiplanar multisequence MR imaging of the abdomen was performed. Heavily T2-weighted images of the biliary and pancreatic ducts were obtained, and three-dimensional MRCP images were rendered by post processing.  COMPARISON:  None.  FINDINGS: Exam is limited due to patient motion. Patient had difficult tolerating the length of the exam as well as difficulty breath holding.  Lower chest:  There is dense bibasilar atelectasis.  Hepatobiliary: There is no intrahepatic biliary duct dilatation. No discrete hepatic lesion on this noncontrast exam. There is loss of signal intensity on the opposed phase imaging consistent with hepatic steatosis.  There are several small gallstones layering dependently within the lumen of the gallbladder. These measure approximately 6. There is no gallbladder inflammation evident. Several stones appear to be with in the neck of the gallbladder (image 16, series 3).  The common bile duct and common hepatic duct are not dilated. There is a filling defect within the  common bile duct on image 17, series 3. This is small measuring approximately 4 mm. This appears to be replicated on the heavily T2 weighted series 20 on image 3.  Pancreas: There is extensive inflammation involving the head body and tail of the pancreas suggesting acute pancreatitis. Pancreatic duct is difficult to visualize on the sequences due to respiratory motion but does not appear dilated. There is fluid along the anterior perirenal fascia left and right. Small amount fluid around the liver and spleen. No organized fluid collections are evident.  Spleen: Normal spleen.  Adrenals/urinary tract: There several renal lesions have high signal intensity T2 weighted imaging suggesting renal cysts. No hydronephrosis.  Stomach/Bowel: Stomach and limited of the small bowel is unremarkable  Vascular/Lymphatic: Abdominal aortic normal caliber. No retroperitoneal periportal lymphadenopathy.   Musculoskeletal: No aggressive osseous lesion  IMPRESSION: 1. Acute pancreatitis. No organized fluid collections. Enhancement pattern of pancreas cannot be determined. 2. Cholelithiasis without evidence of acute cholecystitis. 3. Potential small 4 mm filling defect within the mid common bile duct. Diagnostic confidence is limited due to patient motion and breathing. No common bile duct dilatation or biliary duct dilatation. 4. Hepatic steatosis. 5. Dense bibasilar atelectasis .   Electronically Signed   By: Genevive Bi M.D.   On: 01/16/2015 08:25   Dg Chest Port 1 View  01/18/2015   CLINICAL DATA:  Shortness of breath.  EXAM: PORTABLE CHEST - 1 VIEW  COMPARISON:  None.  FINDINGS: NG tube noted with tip below left hemidiaphragm. Mediastinum and hilar structures are normal. Heart size normal. Bilateral pulmonary infiltrates and pleural effusions noted. No pneumothorax. No acute bony abnormality .  IMPRESSION: 1. NG tube noted with tip below left hemidiaphragm. 2. Bilateral prominent basilar pulmonary infiltrates. These changes could be related to pulmonary edema and/or pneumonia. Small bilateral pleural effusions.   Electronically Signed   By: Maisie Fus  Register   On: 01/18/2015 07:25   Dg Abd Portable 1v  01/17/2015   CLINICAL DATA:  Abdominal pain for 3 days, vomiting for 1 day  EXAM: PORTABLE ABDOMEN - 1 VIEW  COMPARISON:  01/15/2015  FINDINGS: The bowel gas pattern is normal. No radio-opaque calculi or other significant radiographic abnormality are seen. Some colonic gas is noted in right colon and mid transverse colon without significant distension  IMPRESSION: Negative.   Electronically Signed   By: Natasha Mead M.D.   On: 01/17/2015 19:51      ASSESSMENT: NSTEMI, in the setting of hypotension and gallstone pancreatitis; syncope  PLAN:  Off of IV heparin at this time.  3VD by cardiac cath yesterday.  Cr stable this AM  Family h/o CAD.  HTN: BPs elevated at home at times.  COntinue  antihypertensives as needed at this time.  Added beta blocker.  Now with episode of SVT, and HTN, increase Coreg.  COnsider Amlodipine if BP stays high..  Of note, patient is active at home prior to having the gallstone issue.  He walks hills without any problems.  He is not reporting any preexisting cardiac sx.  Moderate renal insufficiency.  Cr stable.    I discussed the plan with the patient and his family at length.  Dr. Donata Clay has seen the patient and there is a plan for CABG in a few weeks after pancreatitis has cooled down.  I think his ischemia has stabilized since his Troponin levelled off.   Avoid nephrotoxins.  MRCP performed per Dr. Leone Payor.  No emergent GI procedures need to be done.  The patient is quite complex.  Multiple organ systems are involved.  WBC, lipase improved from admission. Kidney function improved.  Plts improving. Stable. Likely due to inflammatory state.   Start clear liquid diet.  Corky Crafts, MD  01/18/2015  10:19 AM

## 2015-01-18 NOTE — Progress Notes (Signed)
[place] [place] TEAM 1 - Stepdown/ICU TEAM Progress Note  [name] [name] IDH:[id number] DOB: 07-08-1941 DOA: 01/13/2015 PCP: Monico Blitz, MD  Admit HPI / Brief Narrative: 74 yo male h/o HTN and DM who presented to [facility] ED after a syncopal episode.  He had been having persistent epigastric abdominal pain with associated nausea. The pain progressively got worse, and up to an 8/10 when he passed out. EMS noted he was bradycardic in the 30s with sbp documented in the mid 60s.He had previously been told he has a gallbladder full of stones but had been asymptomatic w/ no h/o pancreatitis.   In ED at [facility] showed cbd stone and pancreatitis. Lip was >300 (no actual level).  AST/ALT 247/212 -  tbili 1.9 - alk phos normal. Lactic acid level 2.2 . Creat 1.7.  The pt was transferred to [facility].   HPI/Subjective: The patient experienced significant nausea with vomiting last night and ultimately require placement of an NG tube.  He is tolerating the NG tube quite well.  He denies nausea chest pain shortness of breath or even abdominal pain at the present time.  He does state that he's moved his bowels today.  Assessment/Plan:  Acute Gallstone pancreatitis  MRCP suggests possible 4 mm stone within the common bile duct (though images not ideal ) - GI is following - clinically the patient appears to be slowly improving from this standpoint - lipase has normalized - LFTs are normal - total bili remains elevated but is stable  NSTEMI - CAD Cardiac cath revealed total RCA, total prox cfx, severe prox LAD, 50% dz of dominant diagonal - Cards following - will need eventual CABG - TCTS now following   SB ileus  Had significant difficulty with nausea and vomiting last night - NG placed - continue on clear liquids only - will attempt to clamp NG tomorrow if continues to move bowels  Acute kidney injury  renal ultrasound unrevealing - avoid all potential nephrotoxins - creatinine has improved - follow  trend  Hypercalcemia Calcium corrects to 7.5 - supplement and follow  Hypokalemia Supplement to goal of 4.0  Syncope  Initially felt to be Vagal due to pain - no recurrence since admission   Bradycardia  Initially felt to be Vagal - has not recurred while inpatient   SVT  Responded to adenosine given at bedside with restoration of sinus rhythm - cardiology following  Newly diagnosed systolic congestive heart failure No ACE inhibitor due to acute kidney injury - on beta blocker   Thrombocytopenia  HIT panel negative - suspect due to acute inflam illness - no evidence of acute blood loss   Hypotension > hypertension Resolved with volume expansion - titrate medical therapy as indicated  DM Reasonably well controlled at the present time - no change in treatment plan today   Nutrition Wish to avoid TNA as has been linked to higher mortality in acute pancreatitis - anticipate oral intake will resume tomorrow - if not we will have to consider passing a feeding tube into the duodenum to attempt nutritional supplementation per the GI tract  Code Status: FULL Family Communication: Lengthy discussion with patient and daughter at bedside   Disposition Plan: SDU  Consultants: GI Carlean Purl [facility]  TCTS  Procedures: TTE - 5/9 - EF 30-40% - akinesis of basal-midinferior myocardium - severe hypokinesis lateral myocardium - grade 1 DD Cardiac cath 5/12 - results noted above  Antibiotics: Zosyn 5/12 > 5/13  DVT prophylaxis: Subcutaneous heparin   Objective:  Blood pressure 172/82, pulse 70, temperature 98.4 F (36.9 C), temperature source Axillary, resp. rate 19, height 6' (1.829 m), weight 83.3 kg (183 lb 10.3 oz), SpO2 98 %.  Intake/Output Summary (Last 24 hours) at 01/18/15 0853 Last data filed at 01/18/15 0830  Gross per 24 hour  Intake    350 ml  Output   3000 ml  Net  -2650 ml   Exam: General: No acute respiratory distress - alert and oriented - NG to wall  suction Lungs: Clear to auscultation bilaterally without wheezes or crackles Cardiovascular: Regular rate and rhythm without murmur gallop or rub  Abdomen: remarkably non-tender in epigastrium, protuberant but soft, bowel sounds hypoactive but positive, no rebound, no ascites, no appreciable mass Extremities: No significant cyanosis, clubbing, or edema bilateral lower extremities  Data Reviewed: Basic Metabolic Panel:  Recent Labs Lab 01/15/15 0159 01/16/15 0228 01/17/15 0249 01/17/15 2143 01/18/15 0237  NA 137 138 141 143 144  K 4.1 3.8 4.5 3.2* 3.3*  CL 113* 111 114* 109 111  CO2 19* 16* 15* 17* 20*  GLUCOSE 148* 165* 113* 187* 204*  BUN 30* 41* 43* 42* 42*  CREATININE 1.65* 1.92* 1.52* 1.52* 1.52*  CALCIUM 5.8* 5.5* 5.3* 5.9* 6.0*  MG 1.4*  --   --   --  1.9  PHOS 1.7*  --   --   --   --     CBC:  Recent Labs Lab 01/15/15 0159 01/16/15 0228 01/17/15 0249 01/17/15 2143 01/18/15 0237  WBC 18.2* 14.4* 14.9* 15.2* 15.1*  NEUTROABS  --   --   --   --  13.0*  HGB 15.6 13.9 13.0 13.9 13.4  HCT 46.0 41.5 37.9* 40.1 39.2  MCV 89.7 89.8 89.6 88.9 88.3  PLT 83* 69* 61* 105* 111*    Liver Function Tests:  Recent Labs Lab 01/15/15 0159 01/15/15 1130 01/16/15 1235 01/17/15 0249 01/17/15 2143 01/18/15 0237  AST 67*  --  35 37 38 36  ALT 106*  --  56 49 52 49  ALKPHOS 42  --  45 58 62 52  BILITOT 2.7* 2.6* 2.5* 2.0* 2.4* 2.4*  PROT 5.2*  --  5.0* 5.2* 5.4* 5.1*  ALBUMIN 2.5*  --  2.1* 2.0* 2.2* 2.1*    Recent Labs Lab 01/13/15 2012 01/15/15 0159 01/15/15 1130 01/18/15 0237  LIPASE 1230* 474*  --  35  AMYLASE  --   --  978* 121*    Coags:  Recent Labs Lab 01/14/15 0122  INR 1.19    Cardiac Enzymes:  Recent Labs Lab 01/13/15 2012 01/14/15 0122 01/14/15 0816 01/15/15 0159 01/17/15 0249  CKTOTAL  --   --  437*  --   --   CKMB  --   --  40.7*  --   --   TROPONINI 2.48* 3.94* 6.57*  6.48* 6.67* 1.47*    CBG:  Recent Labs Lab  01/17/15 1915 01/17/15 2128 01/18/15 0005 01/18/15 0418 01/18/15 0745  GLUCAP 164* 170* 159* 182* 179*    Recent Results (from the past 240 hour(s))  MRSA PCR Screening     Status: None   Collection Time: 01/13/15  6:33 PM  Result Value Ref Range Status   MRSA by PCR NEGATIVE NEGATIVE Final    Comment:        The GeneXpert MRSA Assay (FDA approved for NASAL specimens only), is one component of a comprehensive MRSA colonization surveillance program. It is not intended to diagnose MRSA infection nor to  guide or monitor treatment for MRSA infections.      Studies:   Recent x-ray studies have been reviewed in detail by the Attending Physician  Scheduled Meds:  Scheduled Meds: . antiseptic oral rinse  7 mL Mouth Rinse q12n4p  . aspirin  81 mg Oral Daily  . carvedilol  3.125 mg Oral BID WC  . chlorhexidine  15 mL Mouth Rinse BID  . heparin  5,000 Units Subcutaneous 3 times per day  . insulin aspart  0-9 Units Subcutaneous 6 times per day  . latanoprost  1 drop Both Eyes QHS  . piperacillin-tazobactam  3.375 g Intravenous Q8H  . sodium chloride  3 mL Intravenous Q12H  . zolpidem  5 mg Oral Once    Time spent on care of this patient: 35 mins   Jenisis Harmsen T , MD   [facility] Office  [phone] Pager - Text Page per Shea Evans as per below:  On-Call/Text Page:      [email or web address]      password [email or web address]  If 7PM-7AM, please contact night-coverage [email or web address] Password [email or web address] 01/18/2015, 8:53 AM   LOS: 5 days

## 2015-01-19 ENCOUNTER — Inpatient Hospital Stay (HOSPITAL_COMMUNITY): Payer: Medicare Other

## 2015-01-19 LAB — GLUCOSE, CAPILLARY
GLUCOSE-CAPILLARY: 138 mg/dL — AB (ref 65–99)
GLUCOSE-CAPILLARY: 179 mg/dL — AB (ref 65–99)
GLUCOSE-CAPILLARY: 183 mg/dL — AB (ref 65–99)
Glucose-Capillary: 212 mg/dL — ABNORMAL HIGH (ref 65–99)
Glucose-Capillary: 237 mg/dL — ABNORMAL HIGH (ref 65–99)
Glucose-Capillary: 319 mg/dL — ABNORMAL HIGH (ref 65–99)

## 2015-01-19 LAB — COMPREHENSIVE METABOLIC PANEL
ALBUMIN: 2.3 g/dL — AB (ref 3.5–5.0)
ALT: 48 U/L (ref 17–63)
ANION GAP: 13 (ref 5–15)
AST: 38 U/L (ref 15–41)
Alkaline Phosphatase: 73 U/L (ref 38–126)
BUN: 33 mg/dL — ABNORMAL HIGH (ref 6–20)
CALCIUM: 7 mg/dL — AB (ref 8.9–10.3)
CO2: 28 mmol/L (ref 22–32)
Chloride: 108 mmol/L (ref 101–111)
Creatinine, Ser: 1.58 mg/dL — ABNORMAL HIGH (ref 0.61–1.24)
GFR, EST AFRICAN AMERICAN: 49 mL/min — AB (ref >60)
GFR, EST NON AFRICAN AMERICAN: 42 mL/min — AB (ref >60)
Glucose, Bld: 173 mg/dL — ABNORMAL HIGH (ref 65–99)
Potassium: 3.1 mmol/L — ABNORMAL LOW (ref 3.5–5.1)
Sodium: 149 mmol/L — ABNORMAL HIGH (ref 135–145)
TOTAL PROTEIN: 6.7 g/dL (ref 6.5–8.1)
Total Bilirubin: 2.2 mg/dL — ABNORMAL HIGH (ref 0.3–1.2)

## 2015-01-19 LAB — PREALBUMIN: Prealbumin: 6.9 mg/dL — ABNORMAL LOW (ref 18–38)

## 2015-01-19 LAB — PHOSPHORUS: Phosphorus: 1.7 mg/dL — ABNORMAL LOW (ref 2.5–4.6)

## 2015-01-19 LAB — DIFFERENTIAL
Basophils Absolute: 0.1 10*3/uL (ref 0.0–0.1)
Basophils Relative: 0 % (ref 0–1)
EOS ABS: 0 10*3/uL (ref 0.0–0.7)
Eosinophils Relative: 0 % (ref 0–5)
Lymphocytes Relative: 9 % — ABNORMAL LOW (ref 12–46)
Lymphs Abs: 1.4 10*3/uL (ref 0.7–4.0)
Monocytes Absolute: 1.1 10*3/uL — ABNORMAL HIGH (ref 0.1–1.0)
Monocytes Relative: 7 % (ref 3–12)
NEUTROS ABS: 13.8 10*3/uL — AB (ref 1.7–7.7)
NEUTROS PCT: 84 % — AB (ref 43–77)
WBC Morphology: INCREASED

## 2015-01-19 LAB — MAGNESIUM: Magnesium: 2.2 mg/dL (ref 1.7–2.4)

## 2015-01-19 LAB — CBC
HCT: 43.2 % (ref 39.0–52.0)
HEMOGLOBIN: 14.9 g/dL (ref 13.0–17.0)
MCH: 30.7 pg (ref 26.0–34.0)
MCHC: 34.5 g/dL (ref 30.0–36.0)
MCV: 89.1 fL (ref 78.0–100.0)
Platelets: 151 10*3/uL (ref 150–400)
RBC: 4.85 MIL/uL (ref 4.22–5.81)
RDW: 13.7 % (ref 11.5–15.5)
WBC: 16 10*3/uL — ABNORMAL HIGH (ref 4.0–10.5)

## 2015-01-19 LAB — TRIGLYCERIDES: Triglycerides: 195 mg/dL — ABNORMAL HIGH (ref <150)

## 2015-01-19 LAB — CLOSTRIDIUM DIFFICILE BY PCR: Toxigenic C. Difficile by PCR: NEGATIVE

## 2015-01-19 MED ORDER — DIPHENOXYLATE-ATROPINE 2.5-0.025 MG PO TABS
2.0000 | ORAL_TABLET | Freq: Once | ORAL | Status: AC
  Administered 2015-01-19: 2 via ORAL
  Filled 2015-01-19: qty 2

## 2015-01-19 MED ORDER — ISOSORBIDE MONONITRATE 15 MG HALF TABLET
15.0000 mg | ORAL_TABLET | Freq: Every day | ORAL | Status: DC
  Administered 2015-01-19: 15 mg via ORAL
  Filled 2015-01-19 (×2): qty 1

## 2015-01-19 MED ORDER — SODIUM CHLORIDE 0.45 % IV SOLN
INTRAVENOUS | Status: DC
  Administered 2015-01-19 – 2015-01-20 (×3): via INTRAVENOUS

## 2015-01-19 MED ORDER — K PHOS MONO-SOD PHOS DI & MONO 155-852-130 MG PO TABS
500.0000 mg | ORAL_TABLET | Freq: Three times a day (TID) | ORAL | Status: DC
  Administered 2015-01-19 – 2015-01-20 (×6): 500 mg via ORAL
  Filled 2015-01-19 (×9): qty 2

## 2015-01-19 MED ORDER — METOCLOPRAMIDE HCL 5 MG/ML IJ SOLN
5.0000 mg | Freq: Three times a day (TID) | INTRAMUSCULAR | Status: DC | PRN
  Filled 2015-01-19: qty 1

## 2015-01-19 MED ORDER — CALCIUM GLUCONATE 10 % IV SOLN
1.0000 g | Freq: Once | INTRAVENOUS | Status: AC
  Administered 2015-01-19: 1 g via INTRAVENOUS
  Filled 2015-01-19: qty 10

## 2015-01-19 MED ORDER — INSULIN ASPART 100 UNIT/ML ~~LOC~~ SOLN
0.0000 [IU] | Freq: Three times a day (TID) | SUBCUTANEOUS | Status: DC
  Administered 2015-01-19 – 2015-01-20 (×3): 3 [IU] via SUBCUTANEOUS
  Administered 2015-01-20: 7 [IU] via SUBCUTANEOUS
  Administered 2015-01-20: 3 [IU] via SUBCUTANEOUS
  Administered 2015-01-21: 5 [IU] via SUBCUTANEOUS

## 2015-01-19 MED ORDER — INSULIN ASPART 100 UNIT/ML ~~LOC~~ SOLN
4.0000 [IU] | Freq: Once | SUBCUTANEOUS | Status: AC
  Administered 2015-01-19: 4 [IU] via SUBCUTANEOUS

## 2015-01-19 NOTE — Progress Notes (Signed)
CARDIAC REHAB PHASE I   PRE:  Rate/Rhythm: 85 SR with PVC/PACs    BP: sitting 146/80    SaO2: 95 RA  MODE:  Ambulation: to door of room   POST:  Rate/Rhythm: 99 SR    BP: sitting 157/70     SaO2: 95 RA  Pt able to walk to door of room with RW and gait belt for security. Sts he is weak. He would not walk farther into the hall due to fear of stool incontinence (had incontinent diarrhea earlier). Return to recliner. Pt quiet and hurrying. SOB after sitting. Encouraged pt to communicate more and take his times. Discussed OHS with pt and daughters. Gave OHS booklet, guideline, and video to watch. Pt is regularly practicing IS but only able to do [phone] mL right now. Encouraged pt and family to have a balance of light activity at d/c.  [phone]   Elissa Lovett [place] CES, ACSM 01/19/2015 9:38 AM

## 2015-01-19 NOTE — Progress Notes (Signed)
Noted NG suction was not completely connected; Once connected pt had 800cc bloody output.  Pt denies pain, dizziness.  Dr. Sharon Seller notified.  Per MD will continue orders as planned and monitor pt closely for nausea/ vomiting.  Will continue to monitor.

## 2015-01-19 NOTE — Progress Notes (Signed)
[name] TEAM 1 - Stepdown/ICU TEAM Progress Note  [name] XVQ:[id number] DOB: [birth year] DOA: 01/13/2015 PCP: [name] Blitz, MD  Admit HPI / Brief Narrative: 74 yo male h/o HTN and DM who presented to [name] after a syncopal episode.  He had been having persistent epigastric abdominal pain with associated nausea. The pain progressively got worse, and up to an 8/10 when he passed out. EMS noted he was bradycardic in the 30s with sbp documented in the mid 60s.He had previously been told he has a gallbladder full of stones but had been asymptomatic w/ no h/o pancreatitis.   In ED at [place] showed cbd stone and pancreatitis. Lip was >300 (no actual level).  AST/ALT 247/212 -  tbili 1.9 - alk phos normal. Lactic acid level 2.2 . Creat 1.7.  The pt was transferred to [name].   HPI/Subjective: The patient feels much better today.  He denies any nausea whatsoever.  He is experiencing significant irritation from his NG tube.  He states that he is having frequent liquid stools.  He denies chest pain or shortness of breath.  He is anxious to begin eating and states he is quite hungry.  Assessment/Plan:  Acute Gallstone pancreatitis  MRCP suggests possible 4 mm stone within the common bile duct (though images not ideal ) - GI is following - clinically the patient appears to be improving more rapidly now - lipase has normalized - LFTs are normal - total bili remains elevated but is stable - no GI interventions planned in the near future  NSTEMI - CAD Cardiac cath revealed total RCA, total prox cfx, severe prox LAD, 50% dz of dominant diagonal - Cards following - will need eventual CABG - TCTS now following - patient will need to fully recover from his severe acute pancreatitis prior to surgery  SB ileus  Had significant difficulty with nausea and vomiting initially requiring NG tube - is now moving bowels frequently, abdomen is flat and benign, and he is actually quite hungry with  no nausea whatsoever - suction from NG has produced very little fluid over the last shift - remove NG today and begin diet - monitor for increased pain or nausea with intractable vomiting   Acute kidney injury  renal ultrasound unrevealing - avoid all potential nephrotoxins - creatinine has improved - following trend  Hypercalcemia Calcium corrects to 8.4 - supplement one additional IV dose and follow  Hypokalemia Supplement to goal of 4.0 - magnesium is normal   Syncope  Initially felt to be Vagal due to pain - no recurrence since admission   Bradycardia  Initially felt to be Vagal - has not recurred while inpatient   SVT  Responded to adenosine given at bedside with restoration of sinus rhythm - cardiology following - normal sinus rhythm at present   Newly diagnosed systolic congestive heart failure No ACE inhibitor due to acute kidney injury - on beta blocker - is actually dehydrated presently so volume resuscitation continues   Thrombocytopenia  HIT panel negative - suspect due to acute inflam illness - no evidence of acute blood loss -  platelet count climbing   Hypotension > hypertension Resolved with volume expansion - blood pressure poorly controlled  - adjust treatment and follow   DM Reasonably well controlled - no change in treatment plan today   Nutrition Wish to avoid TNA as has been linked to higher mortality in acute pancreatitis - patient has improved significantly and is highly motivated to try oral intake -  will begin bland diet and advanced as able to low-fat diet - if not able to tolerate we will have to consider passing a feeding tube into the jejunum to attempt nutritional supplementation per the GI tract - nonetheless oral or tube feeds are favored over TNA which will be administered only as a last resort   Code Status: FULL Family Communication: Lengthy discussion with patient, daughter, and brother-in-law at bedside   Disposition Plan:  SDU  Consultants: GI [name]  TCTS  Procedures: TTE - 5/9 - EF 30-40% - akinesis of basal-midinferior myocardium - severe hypokinesis lateral myocardium - grade 1 DD Cardiac cath 5/12 - results noted above  Antibiotics: Zosyn 5/12 > 5/13  DVT prophylaxis: Subcutaneous heparin   Objective: Blood pressure 153/72, pulse 84, temperature 98 F (36.7 C), temperature source Oral, resp. rate 18, height 6' (1.829 m), weight 80.4 kg (177 lb 4 oz), SpO2 98 %.  Intake/Output Summary (Last 24 hours) at 01/19/15 1045 Last data filed at 01/19/15 0901  Gross per 24 hour  Intake 2296.25 ml  Output   4551 ml  Net -2254.75 ml   Exam: General: No acute respiratory distress - alert and oriented - NG to wall suction with scant fluid  Lungs: Clear to auscultation bilaterally without wheezes or crackles Cardiovascular: Regular rate and rhythm without murmur gallop or rub  Abdomen: remains non-tender in epigastrium,  no longer protuberant, soft, bowel sounds positive, no rebound, no ascites, no appreciable mass Extremities: No significant cyanosis, clubbing, or edema bilateral lower extremities  Data Reviewed: Basic Metabolic Panel:  Recent Labs Lab 01/15/15 0159 01/16/15 0228 01/17/15 0249 01/17/15 2143 01/18/15 0237 01/19/15 0459  NA 137 138 141 143 144 149*  K 4.1 3.8 4.5 3.2* 3.3* 3.1*  CL 113* 111 114* 109 111 108  CO2 19* 16* 15* 17* 20* 28  GLUCOSE 148* 165* 113* 187* 204* 173*  BUN 30* 41* 43* 42* 42* 33*  CREATININE 1.65* 1.92* 1.52* 1.52* 1.52* 1.58*  CALCIUM 5.8* 5.5* 5.3* 5.9* 6.0* 7.0*  MG 1.4*  --   --   --  1.9 2.2  PHOS 1.7*  --   --   --   --  1.7*    CBC:  Recent Labs Lab 01/16/15 0228 01/17/15 0249 01/17/15 2143 01/18/15 0237 01/19/15 0459  WBC 14.4* 14.9* 15.2* 15.1* 16.0*  NEUTROABS  --   --   --  13.0* 13.8*  HGB 13.9 13.0 13.9 13.4 14.9  HCT 41.5 37.9* 40.1 39.2 43.2  MCV 89.8 89.6 88.9 88.3 89.1  PLT 69* 61* 105* 111* 151     Liver Function Tests:  Recent Labs Lab 01/16/15 1235 01/17/15 0249 01/17/15 2143 01/18/15 0237 01/19/15 0459  AST 35 37 38 36 38  ALT 56 49 52 49 48  ALKPHOS 45 58 62 52 73  BILITOT 2.5* 2.0* 2.4* 2.4* 2.2*  PROT 5.0* 5.2* 5.4* 5.1* 6.7  ALBUMIN 2.1* 2.0* 2.2* 2.1* 2.3*    Recent Labs Lab 01/13/15 2012 01/15/15 0159 01/15/15 1130 01/18/15 0237  LIPASE 1230* 474*  --  35  AMYLASE  --   --  978* 121*    Coags:  Recent Labs Lab 01/14/15 0122  INR 1.19    Cardiac Enzymes:  Recent Labs Lab 01/13/15 2012 01/14/15 0122 01/14/15 0816 01/15/15 0159 01/17/15 0249  CKTOTAL  --   --  437*  --   --   CKMB  --   --  40.7*  --   --  TROPONINI 2.48* 3.94* 6.57*  6.48* 6.67* 1.47*    CBG:  Recent Labs Lab 01/18/15 1619 01/18/15 2023 01/19/15 0045 01/19/15 0406 01/19/15 0759  GLUCAP 187* 198* 183* 138* 179*    Recent Results (from the past 240 hour(s))  MRSA PCR Screening     Status: None   Collection Time: 01/13/15  6:33 PM  Result Value Ref Range Status   MRSA by PCR NEGATIVE NEGATIVE Final    Comment:        The GeneXpert MRSA Assay (FDA approved for NASAL specimens only), is one component of a comprehensive MRSA colonization surveillance program. It is not intended to diagnose MRSA infection nor to guide or monitor treatment for MRSA infections.      Studies:   Recent x-ray studies have been reviewed in detail by the Attending Physician  Scheduled Meds:  Scheduled Meds: . antiseptic oral rinse  7 mL Mouth Rinse q12n4p  . aspirin  81 mg Oral Daily  . carvedilol  6.25 mg Oral BID WC  . chlorhexidine  15 mL Mouth Rinse BID  . heparin  5,000 Units Subcutaneous 3 times per day  . insulin aspart  0-9 Units Subcutaneous 6 times per day  . latanoprost  1 drop Both Eyes QHS    Time spent on care of this patient: 35 mins   Alydia Gosser T , MD   [facility] Office  [phone] Pager - Text Page per Shea Evans as per  below:  On-Call/Text Page:      [email or web address]      password [email or web address]  If 7PM-7AM, please contact night-coverage [email or web address] Password [email or web address] 01/19/2015, 10:45 AM   LOS: 6 days

## 2015-01-19 NOTE — Progress Notes (Signed)
UNASSIGNED PATIENT  CROSS COVER [facility]-GI Subjective: Chart reviewed and patient examined. Patient seems to have improved since admission with no abdominal pain or nausea at this time. He has been having some loose stools without any melena or hematochezia. The nurses are trying to collect a stool sample.   Objective: Vital signs in last 24 hours: Temp:  [97.9 F (36.6 C)-99.6 F (37.6 C)] 98.1 F (36.7 C) (05/14 1130) Pulse Rate:  [72-89] 89 (05/14 1400) Resp:  [18-27] 23 (05/14 1400) BP: (153-193)/(59-104) 175/64 mmHg (05/14 1400) SpO2:  [90 %-98 %] 92 % (05/14 1400) Weight:  [80.4 kg (177 lb 4 oz)-81.3 kg (179 lb 3.7 oz)] 81.3 kg (179 lb 3.7 oz) (05/14 1115) Last BM Date: 01/19/15  Intake/Output from previous day: 05/13 0701 - 05/14 0700 In: 2016.3 [P.O.:580; I.V.:1036.3; IV Piggyback:400] Out: 4651 [Urine:850; Emesis/NG output:3800; Stool:1] Intake/Output this shift: Total I/O In: 925.9 [P.O.:380; I.V.:435.9; IV Piggyback:110] Out: 900 [Emesis/NG output:900]  General appearance: alert, cooperative, appears stated age and no distress Resp: clear to auscultation bilaterally Cardio: regular rate and rhythm, S1, S2 normal, no murmur, click, rub or gallop GI: soft, non-tender; bowel sounds normal; no masses,  no organomegaly Extremities: extremities normal, atraumatic, no cyanosis or edema  Lab Results:  Recent Labs  01/17/15 2143 01/18/15 0237 01/19/15 0459  WBC 15.2* 15.1* 16.0*  HGB 13.9 13.4 14.9  HCT 40.1 39.2 43.2  PLT 105* 111* 151   BMET  Recent Labs  01/17/15 2143 01/18/15 0237 01/19/15 0459  NA 143 144 149*  K 3.2* 3.3* 3.1*  CL 109 111 108  CO2 17* 20* 28  GLUCOSE 187* 204* 173*  BUN 42* 42* 33*  CREATININE 1.52* 1.52* 1.58*  CALCIUM 5.9* 6.0* 7.0*   LFT  Recent Labs  01/19/15 0459  PROT 6.7  ALBUMIN 2.3*  AST 38  ALT 48  ALKPHOS 73  BILITOT 2.2*   Studies/Results: Ct Abdomen Pelvis Wo Contrast  01/18/2015   CLINICAL DATA:  Nausea and  vomiting after cardiac catheterization yesterday.  EXAM: CT ABDOMEN AND PELVIS WITHOUT CONTRAST  TECHNIQUE: Multidetector CT imaging of the abdomen and pelvis was performed following the standard protocol without IV contrast.  COMPARISON:  Abdomen 01/17/2015  FINDINGS: Bilateral pleural effusions with basilar atelectasis. Coronary artery calcifications. Residual contrast material in the esophagus may represent reflux or dysmotility.  Cholelithiasis with multiple small stones in the gallbladder. No gallbladder wall thickening. Stones are present in the common bile duct. No significant bile duct dilatation. There is diffuse inflammatory infiltration and edema in the fat around the pancreas consistent with acute pancreatitis. Calcifications in the head of the pancreas may represent stones or postinflammatory calcifications. No loculated fluid collection is demonstrated to suggest abscess.  The unenhanced appearance of the liver, spleen, adrenal glands, inferior vena cava, and retroperitoneal lymph nodes is unremarkable. Calcification of the aorta without aneurysm. Bilateral low-attenuation renal lesions likely representing cyst but too small to characterize. No hydronephrosis in either stomach, small bowel, and colon are not abnormally distended. There is suggestion of wall thickening in some of the small bowel loops possibly indicating enteritis. Kidney. Small amount of free fluid in the abdomen and pelvis. No free air.  Pelvis: Appendix is normal. Bladder wall is not thickened. Prostate gland is not enlarged. No pelvic mass or lymphadenopathy. Degenerative changes in the spine. No destructive bone lesions. Diffuse subcutaneous soft tissue edema.  IMPRESSION: Infiltration edema around the pancreas with fluid in the pericolic gutters and pelvis. Changes are consistent with  acute pancreatitis. No abscess identified. Cholelithiasis with stones appearing in the common bile duct. No bile duct dilatation. Bilateral pleural  effusions with basilar atelectasis.   Electronically Signed   By: Burman Nieves M.D.   On: 01/18/2015 03:22   Dg Chest Port 1 View  01/19/2015   CLINICAL DATA:  Chest congestionPr does have a NG tube placed currently Hx of HTN, DM, cholelithiasis  EXAM: PORTABLE CHEST - 1 VIEW  COMPARISON:  01/18/2015.  FINDINGS: Bibasilar lung opacity is stable, obscuring both hemidiaphragms. This is consistent with combination of moderate pleural effusions with lung base atelectasis, pneumonia or a combination. There is no pulmonary edema. Remainder of the lungs is clear.  No pneumothorax.  Cardiac silhouette is normal in size. No mediastinal or hilar masses.  Nasogastric tube passes below the diaphragm, stable.  IMPRESSION: 1. No significant change from the previous day's study. 2. Persistent bilateral lung base opacity consistent with a combination of moderate pleural effusions with atelectasis, pneumonia or a combination. No pulmonary edema. No pneumothorax.   Electronically Signed   By: Amie Portland M.D.   On: 01/19/2015 09:06   Dg Chest Port 1 View  01/18/2015   CLINICAL DATA:  Shortness of breath.  EXAM: PORTABLE CHEST - 1 VIEW  COMPARISON:  None.  FINDINGS: NG tube noted with tip below left hemidiaphragm. Mediastinum and hilar structures are normal. Heart size normal. Bilateral pulmonary infiltrates and pleural effusions noted. No pneumothorax. No acute bony abnormality .  IMPRESSION: 1. NG tube noted with tip below left hemidiaphragm. 2. Bilateral prominent basilar pulmonary infiltrates. These changes could be related to pulmonary edema and/or pneumonia. Small bilateral pleural effusions.   Electronically Signed   By: Maisie Fus  Register   On: 01/18/2015 07:25   Dg Abd Portable 1v  01/17/2015   CLINICAL DATA:  Abdominal pain for 3 days, vomiting for 1 day  EXAM: PORTABLE ABDOMEN - 1 VIEW  COMPARISON:  01/15/2015  FINDINGS: The bowel gas pattern is normal. No radio-opaque calculi or other significant radiographic  abnormality are seen. Some colonic gas is noted in right colon and mid transverse colon without significant distension  IMPRESSION: Negative.   Electronically Signed   By: Natasha Mead M.D.   On: 01/17/2015 19:51   Medications: I have reviewed the patient's current medications.  Assessment/Plan: 1) Acute gallstone pancreatitis with ?Choledocholithioasis-not clear at this time-labs have normalized except for his total bilirubin. WBC still elevated. 2) Change in bowel habits-stool studies pending. 3) Acute kidney injury. 4) HTN/Cardiomyopathy.  LOS: 6 days   Yessenia Maillet 01/19/2015, 3:00 PM

## 2015-01-20 DIAGNOSIS — E876 Hypokalemia: Secondary | ICD-10-CM

## 2015-01-20 DIAGNOSIS — I5021 Acute systolic (congestive) heart failure: Secondary | ICD-10-CM

## 2015-01-20 LAB — CBC
HCT: 35 % — ABNORMAL LOW (ref 39.0–52.0)
HCT: 27.8 % — ABNORMAL LOW (ref 39.0–52.0)
HEMOGLOBIN: 11.9 g/dL — AB (ref 13.0–17.0)
HEMOGLOBIN: 9.6 g/dL — AB (ref 13.0–17.0)
MCH: 30.3 pg (ref 26.0–34.0)
MCH: 30.7 pg (ref 26.0–34.0)
MCHC: 34 g/dL (ref 30.0–36.0)
MCHC: 34.5 g/dL (ref 30.0–36.0)
MCV: 89.1 fL (ref 78.0–100.0)
MCV: 88.8 fL (ref 78.0–100.0)
Platelets: 140 10*3/uL — ABNORMAL LOW (ref 150–400)
Platelets: 125 10*3/uL — ABNORMAL LOW (ref 150–400)
RBC: 3.93 MIL/uL — ABNORMAL LOW (ref 4.22–5.81)
RBC: 3.13 MIL/uL — ABNORMAL LOW (ref 4.22–5.81)
RDW: 13.6 % (ref 11.5–15.5)
RDW: 13.7 % (ref 11.5–15.5)
WBC: 17.9 10*3/uL — AB (ref 4.0–10.5)
WBC: 21.6 10*3/uL — AB (ref 4.0–10.5)

## 2015-01-20 LAB — OCCULT BLOOD X 1 CARD TO LAB, STOOL: FECAL OCCULT BLD: POSITIVE — AB

## 2015-01-20 LAB — PROTIME-INR
INR: 1.22 (ref 0.00–1.49)
Prothrombin Time: 15.5 seconds — ABNORMAL HIGH (ref 11.6–15.2)

## 2015-01-20 LAB — PHOSPHORUS: Phosphorus: 2.4 mg/dL — ABNORMAL LOW (ref 2.5–4.6)

## 2015-01-20 LAB — COMPREHENSIVE METABOLIC PANEL
ALT: 37 U/L (ref 17–63)
AST: 31 U/L (ref 15–41)
Albumin: 1.9 g/dL — ABNORMAL LOW (ref 3.5–5.0)
Alkaline Phosphatase: 60 U/L (ref 38–126)
Anion gap: 11 (ref 5–15)
BILIRUBIN TOTAL: 1.5 mg/dL — AB (ref 0.3–1.2)
BUN: 46 mg/dL — ABNORMAL HIGH (ref 6–20)
CALCIUM: 6.7 mg/dL — AB (ref 8.9–10.3)
CO2: 28 mmol/L (ref 22–32)
Chloride: 104 mmol/L (ref 101–111)
Creatinine, Ser: 1.61 mg/dL — ABNORMAL HIGH (ref 0.61–1.24)
GFR calc non Af Amer: 41 mL/min — ABNORMAL LOW (ref >60)
GFR, EST AFRICAN AMERICAN: 48 mL/min — AB (ref >60)
GLUCOSE: 246 mg/dL — AB (ref 65–99)
POTASSIUM: 2.7 mmol/L — AB (ref 3.5–5.1)
Sodium: 143 mmol/L (ref 135–145)
Total Protein: 4.9 g/dL — ABNORMAL LOW (ref 6.5–8.1)

## 2015-01-20 LAB — APTT: aPTT: 30 seconds (ref 24–37)

## 2015-01-20 LAB — GLUCOSE, CAPILLARY
Glucose-Capillary: 228 mg/dL — ABNORMAL HIGH (ref 65–99)
Glucose-Capillary: 318 mg/dL — ABNORMAL HIGH (ref 65–99)
Glucose-Capillary: 231 mg/dL — ABNORMAL HIGH (ref 65–99)

## 2015-01-20 LAB — MAGNESIUM: Magnesium: 2.1 mg/dL (ref 1.7–2.4)

## 2015-01-20 MED ORDER — SODIUM CHLORIDE 0.9 % IV SOLN
Freq: Once | INTRAVENOUS | Status: AC
  Administered 2015-01-21: 02:00:00 via INTRAVENOUS

## 2015-01-20 MED ORDER — POTASSIUM CHLORIDE CRYS ER 20 MEQ PO TBCR
40.0000 meq | EXTENDED_RELEASE_TABLET | Freq: Two times a day (BID) | ORAL | Status: DC
  Administered 2015-01-20 (×2): 40 meq via ORAL
  Filled 2015-01-20 (×4): qty 2

## 2015-01-20 MED ORDER — POTASSIUM CHLORIDE 10 MEQ/100ML IV SOLN
10.0000 meq | INTRAVENOUS | Status: DC
  Administered 2015-01-20 (×3): 10 meq via INTRAVENOUS
  Filled 2015-01-20 (×3): qty 100

## 2015-01-20 MED ORDER — ISOSORBIDE MONONITRATE ER 30 MG PO TB24
30.0000 mg | ORAL_TABLET | Freq: Every day | ORAL | Status: DC
  Administered 2015-01-20: 30 mg via ORAL
  Filled 2015-01-20 (×2): qty 1

## 2015-01-20 MED ORDER — HYDRALAZINE HCL 10 MG PO TABS
10.0000 mg | ORAL_TABLET | Freq: Three times a day (TID) | ORAL | Status: DC
  Administered 2015-01-20 (×2): 10 mg via ORAL
  Filled 2015-01-20 (×7): qty 1

## 2015-01-20 MED ORDER — DIPHENOXYLATE-ATROPINE 2.5-0.025 MG PO TABS
1.0000 | ORAL_TABLET | Freq: Four times a day (QID) | ORAL | Status: DC | PRN
  Administered 2015-01-20: 1 via ORAL
  Filled 2015-01-20: qty 2
  Filled 2015-01-20: qty 1

## 2015-01-20 MED ORDER — SODIUM CHLORIDE 0.9 % IV SOLN
1.0000 g | Freq: Once | INTRAVENOUS | Status: AC
  Administered 2015-01-20: 1 g via INTRAVENOUS
  Filled 2015-01-20 (×2): qty 10

## 2015-01-20 MED ORDER — DIPHENOXYLATE-ATROPINE 2.5-0.025 MG PO TABS
2.0000 | ORAL_TABLET | Freq: Once | ORAL | Status: AC
  Administered 2015-01-20: 2 via ORAL

## 2015-01-20 MED ORDER — INSULIN GLARGINE 100 UNIT/ML ~~LOC~~ SOLN
12.0000 [IU] | Freq: Every day | SUBCUTANEOUS | Status: DC
  Administered 2015-01-20: 12 [IU] via SUBCUTANEOUS
  Filled 2015-01-20 (×2): qty 0.12

## 2015-01-20 NOTE — Progress Notes (Signed)
SUBJECTIVE:  He feels well and says that he is anxious to go home.  He has not had any SOB or chest pain.  He is eating and diarrhea has improved   PHYSICAL EXAM Filed Vitals:   01/20/15 0344 01/20/15 0400 01/20/15 0629 01/20/15 0815  BP:  126/47 149/63 187/92  Pulse:  67 87 87  Temp: 98.9 F (37.2 C)   98 F (36.7 C)  TempSrc: Oral   Axillary  Resp:  25  25  Height:      Weight: 177 lb 7.5 oz (80.5 kg)     SpO2:  91%  94%   General:  No acute distress Lungs:  Clear Heart:  RRR Abdomen:  Distended with decreased breath sounds Extremities:  No edema  LABS:  Results for orders placed or performed during the hospital encounter of 01/13/15 (from the past 24 hour(s))  Glucose, capillary     Status: Abnormal   Collection Time: 01/19/15 12:13 PM  Result Value Ref Range   Glucose-Capillary 212 (H) 65 - 99 mg/dL   Comment 1 Capillary Specimen   Clostridium Difficile by PCR     Status: None   Collection Time: 01/19/15  3:12 PM  Result Value Ref Range   C difficile by pcr NEGATIVE NEGATIVE  Glucose, capillary     Status: Abnormal   Collection Time: 01/19/15  5:36 PM  Result Value Ref Range   Glucose-Capillary 237 (H) 65 - 99 mg/dL   Comment 1 Capillary Specimen   Glucose, capillary     Status: Abnormal   Collection Time: 01/19/15 10:11 PM  Result Value Ref Range   Glucose-Capillary 319 (H) 65 - 99 mg/dL   Comment 1 Capillary Specimen   Comprehensive metabolic panel     Status: Abnormal   Collection Time: 01/20/15  3:38 AM  Result Value Ref Range   Sodium 143 135 - 145 mmol/L   Potassium 2.7 (LL) 3.5 - 5.1 mmol/L   Chloride 104 101 - 111 mmol/L   CO2 28 22 - 32 mmol/L   Glucose, Bld 246 (H) 65 - 99 mg/dL   BUN 46 (H) 6 - 20 mg/dL   Creatinine, Ser 1.61 (H) 0.61 - 1.24 mg/dL   Calcium 6.7 (L) 8.9 - 10.3 mg/dL   Total Protein 4.9 (L) 6.5 - 8.1 g/dL   Albumin 1.9 (L) 3.5 - 5.0 g/dL   AST 31 15 - 41 U/L   ALT 37 17 - 63 U/L   Alkaline Phosphatase 60 38 - 126 U/L   Total Bilirubin 1.5 (H) 0.3 - 1.2 mg/dL   GFR calc non Af Amer 41 (L) >60 mL/min   GFR calc Af Amer 48 (L) >60 mL/min   Anion gap 11 5 - 15  Magnesium     Status: None   Collection Time: 01/20/15  3:38 AM  Result Value Ref Range   Magnesium 2.1 1.7 - 2.4 mg/dL  Phosphorus     Status: Abnormal   Collection Time: 01/20/15  3:38 AM  Result Value Ref Range   Phosphorus 2.4 (L) 2.5 - 4.6 mg/dL  CBC     Status: Abnormal   Collection Time: 01/20/15  3:38 AM  Result Value Ref Range   WBC 17.9 (H) 4.0 - 10.5 K/uL   RBC 3.93 (L) 4.22 - 5.81 MIL/uL   Hemoglobin 11.9 (L) 13.0 - 17.0 g/dL   HCT 09.6 (L) 04.5 - 40.9 %   MCV 89.1 78.0 - 100.0 fL  MCH 30.3 26.0 - 34.0 pg   MCHC 34.0 30.0 - 36.0 g/dL   RDW 14.4 81.8 - 56.3 %   Platelets 140 (L) 150 - 400 K/uL  Glucose, capillary     Status: Abnormal   Collection Time: 01/20/15  8:10 AM  Result Value Ref Range   Glucose-Capillary 228 (H) 65 - 99 mg/dL   Comment 1 Capillary Specimen     Intake/Output Summary (Last 24 hours) at 01/20/15 0827 Last data filed at 01/20/15 0400  Gross per 24 hour  Intake 2355.92 ml  Output   1225 ml  Net 1130.92 ml     ASSESSMENT AND PLAN:  CAD:  Three vessel by cath.  He will need CABG once other issues improve.       CKD:   Creat is stable.   HYPOKALEMIA:  He is getting 4 runs of IV.  I will give him 40 po x 2 as well.   CARDIOMYOPATHY:  EF 30 - 40%.  No ACE inhibitor with CKD.   On low dose beta blocker.  BP has been up but somewhat labile.  I am going to start low dose hydralazine and increase nitrates for his increasing BP.     Fayrene Fearing [facility] 01/20/2015 8:27 AM

## 2015-01-20 NOTE — Progress Notes (Signed)
Paged Maren Reamer  due to patient having frequent dark loose stools. She entered orders for Lomotil and labs.

## 2015-01-20 NOTE — Progress Notes (Signed)
CRITICAL VALUE ALERT  Critical value received:  K+ 2.7  Date of notification: 01/20/2015  Time of notification:  05:53  Critical value read back:Yes.    Nurse who received alert:  A. Arlesia Kiel,RN  MD notified (1st page):  Veronia Beets, NP  Time of first page:  [phone]  MD notified (2nd page):  Time of second page:  Responding MD: K.Shorr, NP  Time MD responded: 0600

## 2015-01-20 NOTE — Progress Notes (Signed)
CROSS COVER [facility]-GI SSubjective: Since I last evaluated the patient, he seems to be doing much better. The diarrhea he was having yesterday has since resolved and so has his ileus he had when he presented. His overall condition seems to be significantly better. He has not had a BM today. Ate most of his lunch. Seems more alert and engaged today.He denies having any abdominal pain or nasuea.  Objective: Vital signs in last 24 hours: Temp:  [98 F (36.7 C)-99.1 F (37.3 C)] 98 F (36.7 C) (05/15 0815) Pulse Rate:  [67-89] 71 (05/15 1000) Resp:  [21-31] 24 (05/15 1000) BP: (103-187)/(42-92) 154/53 mmHg (05/15 1000) SpO2:  [91 %-96 %] 96 % (05/15 1000) Weight:  [80.5 kg (177 lb 7.5 oz)-81.3 kg (179 lb 3.7 oz)] 80.5 kg (177 lb 7.5 oz) (05/15 0344) Last BM Date: 01/19/15  Intake/Output from previous day: 05/14 0701 - 05/15 0700 In: 2440.9 [P.O.:620; I.V.:1710.9; IV Piggyback:110] Out: 1225 [Urine:325; Emesis/NG output:900] Intake/Output this shift: Total I/O In: 455 [P.O.:200; I.V.:255] Out: -   General appearance: alert, cooperative and no distress Resp: clear to auscultation bilaterally Cardio: regular rate and rhythm, S1, S2 normal, no murmur, click, rub or gallop GI: soft, obese with hyperactive BS; no masses,  no organomegaly  Lab Results:  Recent Labs  01/18/15 0237 01/19/15 0459 01/20/15 0338  WBC 15.1* 16.0* 17.9*  HGB 13.4 14.9 11.9*  HCT 39.2 43.2 35.0*  PLT 111* 151 140*   BMET  Recent Labs  01/18/15 0237 01/19/15 0459 01/20/15 0338  NA 144 149* 143  K 3.3* 3.1* 2.7*  CL 111 108 104  CO2 20* 28 28  GLUCOSE 204* 173* 246*  BUN 42* 33* 46*  CREATININE 1.52* 1.58* 1.61*  CALCIUM 6.0* 7.0* 6.7*   LFT  Recent Labs  01/20/15 0338  PROT 4.9*  ALBUMIN 1.9*  AST 31  ALT 37  ALKPHOS 60  BILITOT 1.5*   Studies/Results: Dg Chest Port 1 View  01/19/2015   CLINICAL DATA:  Chest congestionPr does have a NG tube placed currently Hx of HTN, DM,  cholelithiasis  EXAM: PORTABLE CHEST - 1 VIEW  COMPARISON:  01/18/2015.  FINDINGS: Bibasilar lung opacity is stable, obscuring both hemidiaphragms. This is consistent with combination of moderate pleural effusions with lung base atelectasis, pneumonia or a combination. There is no pulmonary edema. Remainder of the lungs is clear.  No pneumothorax.  Cardiac silhouette is normal in size. No mediastinal or hilar masses.  Nasogastric tube passes below the diaphragm, stable.  IMPRESSION: 1. No significant change from the previous day's study. 2. Persistent bilateral lung base opacity consistent with a combination of moderate pleural effusions with atelectasis, pneumonia or a combination. No pulmonary edema. No pneumothorax.   Electronically Signed   By: Amie Portland M.D.   On: 01/19/2015 09:06   Medications: I have reviewed the patient's current medications.  Assessment/Plan: 1) Gallstone pancreatitis; ?distal CBD stone; Continue present care. Monitor LFT's closely.  2) Diarrhea-improved.  3) Ileus-resolved.  4) Acute kidney injury.  LOS: 7 days   Justyce Yeater 01/20/2015, 10:57 AM

## 2015-01-20 NOTE — Progress Notes (Addendum)
RN paged this NP secondary to pt having frequent loose dark black stools. Hemocult +.  Stat CBC shows a Hgb of 9.6 which is a 5 gm drop since 01/19/15. PT/INR/PTT normal. Given his severe CAD, will go ahead and TF one unit PRBCs. Stat CBC, type and screen now and CBC after TF.  His vitals are stable. He is not hypotensive at this time nor is he tachycardic. Given his CAD, this NP concerned about O2 circulation and pt becoming hypovolemic.  Jimmye Norman, NP [facility] Update: No further stools after Lomotil. Blood transfusion finished. CBC to be drawn in about 1.5 hrs. VSS. Made NPO except sips/chips in case there is a need for colonoscopy/endoscopy today. KJKG, NP [facility] Addendum: Discussed the above with Dr. Lovell Sheehan of [facility] who agrees with TF and plan of care.

## 2015-01-20 NOTE — Progress Notes (Signed)
01/20/2015 1329 Received pt to 2w17 a tx from 2H.  Pt. Is without c/o at this time.  Monitor applied, CT notified.  Oriented pt. To room, call light and bed.  Call bell in reach, family at bedside. Kathryne Hitch

## 2015-01-20 NOTE — Progress Notes (Signed)
[facility] TEAM 1 - Stepdown/ICU TEAM Progress Note  Zakariya Knickerbocker UUE:[id number] DOB: 1941/04/07 DOA: 01/13/2015 PCP: Monico Blitz, MD  Admit HPI / Brief Narrative: 74 yo male h/o HTN and DM who presented to [facility] ED after a syncopal episode.  He had been having persistent epigastric pain with associated nausea. The pain progressively got worse, and up to an 8/10 when he passed out. EMS noted he was bradycardic in the 30s with SBP documented in the mid 60s.He had previously been told he has a gallbladder full of stones but had been asymptomatic w/ no h/o pancreatitis.   In ED at [place] showed cbd stone and pancreatitis. Lip was >300 (no actual level).  AST/ALT 247/212 -  tbili 1.9 - alk phos normal. Lactic acid level 2.2 . Creat 1.7.  The pt was transferred to [facility].   HPI/Subjective: NG removed yesterday.  Diarrhea has resolved the patient states he is still moving his bowels.  He is tolerating his diet without any difficulty per his report and he specifically denies nausea or abdominal pain.  He also denies shortness of breath chest pain or fevers.   Assessment/Plan:  Acute Gallstone pancreatitis  MRCP suggests possible 4 mm stone within the common bile duct (though images not ideal ) - GI is following - lipase has normalized - LFTs are normal - total bili is slowly improving - no GI interventions planned in the near future, until CAD addressed   NSTEMI - CAD Cardiac cath revealed total RCA, total prox cfx, severe prox LAD, 50% dz of dominant diagonal - Cards following - will need eventual CABG - TCTS now following - patient will need to fully recover from his severe acute pancreatitis prior to surgery  SB ileus  Had significant difficulty with nausea and vomiting initially requiring NG tube - abdominal exam is now benign and patient is moving his bowels - will be high risk for recurrence - follow  Acute kidney injury  renal ultrasound unrevealing - avoid all potential  nephrotoxins - creatinine has appears to have stabilized suggesting his new baseline creatinine will be approximately 1.6  Hypocalcemia Calcium corrects to 8.4 again - supplement an additional IV dose and follow  Persistent Hypokalemia Cont to supplement to goal of 4.0 - magnesium is normal - administer IV and oral  Hypophosphatemia Improving nicely with scheduled replacement  Syncope  Initially felt to be Vagal due to pain - no recurrence since admission   Bradycardia  Initially felt to be Vagal - has not recurred while inpatient   SVT  Responded to adenosine given at bedside with restoration of sinus rhythm - normal sinus rhythm at present   Newly diagnosed systolic congestive heart failure No ACE inhibitor due to acute kidney injury - on beta blocker - appears to be euvolemic at present - slow IV fluid and follow   Leukocytosis  Felt to be due to inflammation of pancreatitis - no other sx presently to suggest infection - follow closely w/ low threshold to initiate abx if other sx of infection present themselves  Thrombocytopenia  HIT panel negative - suspect due to acute inflam illness - no evidence of acute blood loss -  platelet count climbing/stable   Hypotension > hypertension Hypotension resolved with volume expansion - blood pressure still poorly controlled  - adjust treatment further and follow   DM CBG poorly controlled w/ diet resumption - adjust tx and follow trend   Nutrition Is tolerating his current diet without any difficulty -  encouraged ongoing judicious oral intake  Code Status: FULL Family Communication: spoke with patient, wife, and daughters at bedside    Disposition Plan: stable for transfer to cardiac telemetry bed   Consultants: GI [facility] Cardiology  TCTS  Procedures: TTE - 5/9 - EF 30-40% - akinesis of basal-midinferior myocardium - severe hypokinesis lateral myocardium - grade 1 DD Cardiac cath 5/12 - results noted  above  Antibiotics: Zosyn 5/12 > 5/13  DVT prophylaxis: Subcutaneous heparin   Objective: Blood pressure 187/92, pulse 87, temperature 98 F (36.7 C), temperature source Axillary, resp. rate 25, height 6' (1.829 m), weight 80.5 kg (177 lb 7.5 oz), SpO2 94 %.  Intake/Output Summary (Last 24 hours) at 01/20/15 1009 Last data filed at 01/20/15 0800  Gross per 24 hour  Intake 2345.92 ml  Output   1225 ml  Net 1120.92 ml   Exam: General: No acute respiratory distress - alert and oriented -  no NG Lungs: Clear to auscultation bilaterally without wheezes or crackles Cardiovascular: Regular rate and rhythm without murmur gallop or rub  Abdomen: non-tender in epigastrium,  mildly  protuberant, soft, bowel sounds positive, no rebound, no ascites, no appreciable mass Extremities: No significant cyanosis, clubbing, or edema bilateral lower extremities  Data Reviewed: Basic Metabolic Panel:  Recent Labs Lab 01/15/15 0159  01/17/15 0249 01/17/15 2143 01/18/15 0237 01/19/15 0459 01/20/15 0338  NA 137  < > 141 143 144 149* 143  K 4.1  < > 4.5 3.2* 3.3* 3.1* 2.7*  CL 113*  < > 114* 109 111 108 104  CO2 19*  < > 15* 17* 20* 28 28  GLUCOSE 148*  < > 113* 187* 204* 173* 246*  BUN 30*  < > 43* 42* 42* 33* 46*  CREATININE 1.65*  < > 1.52* 1.52* 1.52* 1.58* 1.61*  CALCIUM 5.8*  < > 5.3* 5.9* 6.0* 7.0* 6.7*  MG 1.4*  --   --   --  1.9 2.2 2.1  PHOS 1.7*  --   --   --   --  1.7* 2.4*  < > = values in this interval not displayed.  CBC:  Recent Labs Lab 01/17/15 0249 01/17/15 2143 01/18/15 0237 01/19/15 0459 01/20/15 0338  WBC 14.9* 15.2* 15.1* 16.0* 17.9*  NEUTROABS  --   --  13.0* 13.8*  --   HGB 13.0 13.9 13.4 14.9 11.9*  HCT 37.9* 40.1 39.2 43.2 35.0*  MCV 89.6 88.9 88.3 89.1 89.1  PLT 61* 105* 111* 151 140*    Liver Function Tests:  Recent Labs Lab 01/17/15 0249 01/17/15 2143 01/18/15 0237 01/19/15 0459 01/20/15 0338  AST 37 38 36 38 31  ALT 49 52 49 48 37   ALKPHOS 58 62 52 73 60  BILITOT 2.0* 2.4* 2.4* 2.2* 1.5*  PROT 5.2* 5.4* 5.1* 6.7 4.9*  ALBUMIN 2.0* 2.2* 2.1* 2.3* 1.9*    Recent Labs Lab 01/13/15 2012 01/15/15 0159 01/15/15 1130 01/18/15 0237  LIPASE 1230* 474*  --  35  AMYLASE  --   --  978* 121*    Coags:  Recent Labs Lab 01/14/15 0122  INR 1.19    Cardiac Enzymes:  Recent Labs Lab 01/13/15 2012 01/14/15 0122 01/14/15 0816 01/15/15 0159 01/17/15 0249  CKTOTAL  --   --  437*  --   --   CKMB  --   --  40.7*  --   --   TROPONINI 2.48* 3.94* 6.57*  6.48* 6.67* 1.47*  CBG:  Recent Labs Lab 01/19/15 0759 01/19/15 1213 01/19/15 1736 01/19/15 2211 01/20/15 0810  GLUCAP 179* 212* 237* 319* 228*    Recent Results (from the past 240 hour(s))  MRSA PCR Screening     Status: None   Collection Time: 01/13/15  6:33 PM  Result Value Ref Range Status   MRSA by PCR NEGATIVE NEGATIVE Final    Comment:        The GeneXpert MRSA Assay (FDA approved for NASAL specimens only), is one component of a comprehensive MRSA colonization surveillance program. It is not intended to diagnose MRSA infection nor to guide or monitor treatment for MRSA infections.   Clostridium Difficile by PCR     Status: None   Collection Time: 01/19/15  3:12 PM  Result Value Ref Range Status   C difficile by pcr NEGATIVE NEGATIVE Final     Studies:   Recent x-ray studies have been reviewed in detail by the Attending Physician  Scheduled Meds:  Scheduled Meds: . antiseptic oral rinse  7 mL Mouth Rinse q12n4p  . aspirin  81 mg Oral Daily  . carvedilol  6.25 mg Oral BID WC  . chlorhexidine  15 mL Mouth Rinse BID  . heparin  5,000 Units Subcutaneous 3 times per day  . hydrALAZINE  10 mg Oral 3 times per day  . insulin aspart  0-9 Units Subcutaneous TID WC  . isosorbide mononitrate  30 mg Oral Daily  . latanoprost  1 drop Both Eyes QHS  . phosphorus  500 mg Oral TID  . potassium chloride  10 mEq Intravenous Q1 Hr x 4  .  potassium chloride  40 mEq Oral BID    Time spent on care of this patient: 35 mins   MCCLUNG,JEFFREY T , MD   [facility] Office  [phone] Pager - Text Page per Shea Evans as per below:  On-Call/Text Page:      [email or web address]      password [email or web address]  If 7PM-7AM, please contact night-coverage [email or web address] Password [email or web address] 01/20/2015, 10:09 AM   LOS: 7 days

## 2015-01-20 NOTE — Progress Notes (Signed)
Transferred to 2west room 17 by wheelchair, stable,report given to RN. Belongings with pt. 

## 2015-01-20 NOTE — Evaluation (Signed)
Physical Therapy Evaluation Patient Details Name: [name] [name] MRN: [id number] DOB: 1940-10-15 Today's Date: 01/20/2015   History of Present Illness  Admitted with epigastric pain significant; Pancreatitis and common bile duct stone; also with NSTEMI; Cardiac cath revealed total RCA, total prox cfx, severe prox LAD, 50% dz of dominant diagonal - Cards following - will need eventual CABG - TCTS now following - patient will need to fully recover from his severe acute pancreatitis prior to surgery  Past Medical History  Diagnosis Date  . Hypertension   . Diabetes mellitus type 2 in obese   . Cholelithiasis    Past Surgical History  Procedure Laterality Date  . Cardiac catheterization N/A 01/17/2015    Procedure: Left Heart Cath and Coronary Angiography;  Surgeon: Lyn Records, MD;  Location: [facility] INVASIVE CV LAB;  Service: Cardiovascular;  Laterality: N/A;     Clinical Impression  Pt admitted with above diagnosis. Pt currently with functional limitations due to the deficits listed below (see PT Problem List).  Pt will benefit from skilled PT to increase their independence and safety with mobility to allow discharge to the venue listed below.       Follow Up Recommendations No PT follow up (will need more rehab post CABG)    Equipment Recommendations  3in1 (PT)    Recommendations for Other Services OT consult     Precautions / Restrictions Precautions Precautions: Fall Precaution Comments: Fall risk greatly reduced with use of RW      Mobility  Bed Mobility Overal bed mobility: Modified Independent             General bed mobility comments: incr time  Transfers Overall transfer level: Needs assistance Equipment used: None Transfers: Sit to/from Stand Sit to Stand: Min guard         General transfer comment: minguard for steadiness  Ambulation/Gait Ambulation/Gait assistance: Min guard;Supervision Ambulation Distance (Feet): 150 Feet Assistive device:  None;Rolling walker (2 wheeled)       General Gait Details: minguard for steadiness without UE support, with noted decr step length; Better with use of rW  Stairs            Wheelchair Mobility    Modified Rankin (Stroke Patients Only)       Balance Overall balance assessment:  (mildly unsteady without UE support with standing activity)                                           Pertinent Vitals/Pain Pain Assessment: No/denies pain    Home Living Family/patient expects to be discharged to:: Private residence Living Arrangements: Spouse/significant other Available Help at Discharge: Family;Available 24 hours/day Type of Home: House Home Access: Stairs to enter Entrance Stairs-Rails: Doctor, general practice of Steps: 3 Home Layout: Two level;Able to live on main level with bedroom/bathroom;Full bath on main level Home Equipment: Walker - 2 wheels      Prior Function Level of Independence: Independent               Hand Dominance        Extremity/Trunk Assessment   Upper Extremity Assessment: Overall WFL for tasks assessed           Lower Extremity Assessment: Generalized weakness         Communication   Communication: No difficulties  Cognition Arousal/Alertness: Awake/alert Behavior During Therapy: WFL for tasks assessed/performed Overall Cognitive  Status: Within Functional Limits for tasks assessed                      General Comments      Exercises        Assessment/Plan    PT Assessment Patient needs continued PT services  PT Diagnosis Difficulty walking;Acute pain   PT Problem List Decreased strength;Decreased range of motion;Decreased activity tolerance;Decreased balance;Decreased mobility;Decreased knowledge of use of DME;Pain;Cardiopulmonary status limiting activity  PT Treatment Interventions DME instruction;Gait training;Stair training;Functional mobility training;Therapeutic  activities;Therapeutic exercise;Balance training;Patient/family education   PT Goals (Current goals can be found in the Care Plan section) Acute Rehab PT Goals Patient Stated Goal: to recover well enough to have his CABG surgery PT Goal Formulation: With patient Time For Goal Achievement: 01/27/15 Potential to Achieve Goals: Good    Frequency Min 3X/week   Barriers to discharge        Co-evaluation               End of Session   Activity Tolerance: Patient tolerated treatment well Patient left: in chair;with call bell/phone within reach;with family/visitor present Nurse Communication: Mobility status         Time: 8119-1478 PT Time Calculation (min) (ACUTE ONLY): 18 min   Charges:   PT Evaluation $Initial PT Evaluation Tier I: 1 Procedure     PT G CodesOlen Pel 01/20/2015, 4:46 PM  Van Clines, PT  Acute Rehabilitation Services Pager [phone] Office [phone]

## 2015-01-21 ENCOUNTER — Encounter (HOSPITAL_COMMUNITY): Payer: Self-pay | Admitting: *Deleted

## 2015-01-21 ENCOUNTER — Inpatient Hospital Stay (HOSPITAL_COMMUNITY): Payer: Medicare Other

## 2015-01-21 ENCOUNTER — Encounter (HOSPITAL_COMMUNITY)
Admission: AD | Disposition: A | Discharge status: Home Health | Payer: Self-pay | Source: Other Acute Inpatient Hospital | Attending: Internal Medicine

## 2015-01-21 DIAGNOSIS — D62 Acute posthemorrhagic anemia: Secondary | ICD-10-CM | POA: Diagnosis not present

## 2015-01-21 DIAGNOSIS — K8000 Calculus of gallbladder with acute cholecystitis without obstruction: Secondary | ICD-10-CM

## 2015-01-21 DIAGNOSIS — R6521 Severe sepsis with septic shock: Secondary | ICD-10-CM

## 2015-01-21 DIAGNOSIS — K921 Melena: Secondary | ICD-10-CM | POA: Insufficient documentation

## 2015-01-21 DIAGNOSIS — K2210 Ulcer of esophagus without bleeding: Secondary | ICD-10-CM | POA: Insufficient documentation

## 2015-01-21 DIAGNOSIS — J9601 Acute respiratory failure with hypoxia: Secondary | ICD-10-CM | POA: Insufficient documentation

## 2015-01-21 DIAGNOSIS — R578 Other shock: Secondary | ICD-10-CM | POA: Diagnosis not present

## 2015-01-21 DIAGNOSIS — A419 Sepsis, unspecified organism: Secondary | ICD-10-CM | POA: Diagnosis present

## 2015-01-21 DIAGNOSIS — K920 Hematemesis: Secondary | ICD-10-CM | POA: Diagnosis present

## 2015-01-21 HISTORY — PX: ESOPHAGOGASTRODUODENOSCOPY: SHX5428

## 2015-01-21 LAB — COMPREHENSIVE METABOLIC PANEL
ALBUMIN: 1.6 g/dL — AB (ref 3.5–5.0)
ALT: 28 U/L (ref 17–63)
ALT: 32 U/L (ref 17–63)
AST: 34 U/L (ref 15–41)
AST: 30 U/L (ref 15–41)
Albumin: 1.3 g/dL — ABNORMAL LOW (ref 3.5–5.0)
Alkaline Phosphatase: 55 U/L (ref 38–126)
Alkaline Phosphatase: 50 U/L (ref 38–126)
Anion gap: 8 (ref 5–15)
Anion gap: 10 (ref 5–15)
BUN: 60 mg/dL — ABNORMAL HIGH (ref 6–20)
BUN: 72 mg/dL — ABNORMAL HIGH (ref 6–20)
CALCIUM: 6.4 mg/dL — AB (ref 8.9–10.3)
CO2: 24 mmol/L (ref 22–32)
CO2: 21 mmol/L — AB (ref 22–32)
CREATININE: 2.28 mg/dL — AB (ref 0.61–1.24)
Calcium: 6.1 mg/dL — CL (ref 8.9–10.3)
Chloride: 106 mmol/L (ref 101–111)
Chloride: 108 mmol/L (ref 101–111)
Creatinine, Ser: 1.71 mg/dL — ABNORMAL HIGH (ref 0.61–1.24)
GFR calc Af Amer: 31 mL/min — ABNORMAL LOW (ref >60)
GFR calc non Af Amer: 38 mL/min — ABNORMAL LOW (ref >60)
GFR calc non Af Amer: 27 mL/min — ABNORMAL LOW (ref >60)
GFR, EST AFRICAN AMERICAN: 44 mL/min — AB (ref >60)
Glucose, Bld: 303 mg/dL — ABNORMAL HIGH (ref 65–99)
Glucose, Bld: 276 mg/dL — ABNORMAL HIGH (ref 65–99)
Potassium: 3.2 mmol/L — ABNORMAL LOW (ref 3.5–5.1)
Potassium: 3.9 mmol/L (ref 3.5–5.1)
SODIUM: 138 mmol/L (ref 135–145)
Sodium: 139 mmol/L (ref 135–145)
TOTAL PROTEIN: 3.6 g/dL — AB (ref 6.5–8.1)
TOTAL PROTEIN: 4.2 g/dL — AB (ref 6.5–8.1)
Total Bilirubin: 1.1 mg/dL (ref 0.3–1.2)
Total Bilirubin: 1.2 mg/dL (ref 0.3–1.2)

## 2015-01-21 LAB — GLUCOSE, CAPILLARY
GLUCOSE-CAPILLARY: 283 mg/dL — AB (ref 65–99)
GLUCOSE-CAPILLARY: 237 mg/dL — AB (ref 65–99)
GLUCOSE-CAPILLARY: 154 mg/dL — AB (ref 65–99)
GLUCOSE-CAPILLARY: 115 mg/dL — AB (ref 65–99)
GLUCOSE-CAPILLARY: 130 mg/dL — AB (ref 65–99)
Glucose-Capillary: 242 mg/dL — ABNORMAL HIGH (ref 65–99)
Glucose-Capillary: 255 mg/dL — ABNORMAL HIGH (ref 65–99)
Glucose-Capillary: 249 mg/dL — ABNORMAL HIGH (ref 65–99)
Glucose-Capillary: 193 mg/dL — ABNORMAL HIGH (ref 65–99)
Glucose-Capillary: 130 mg/dL — ABNORMAL HIGH (ref 65–99)
Glucose-Capillary: 111 mg/dL — ABNORMAL HIGH (ref 65–99)
Glucose-Capillary: 141 mg/dL — ABNORMAL HIGH (ref 65–99)

## 2015-01-21 LAB — CBC
HEMATOCRIT: 22.2 % — AB (ref 39.0–52.0)
HEMATOCRIT: 26.5 % — AB (ref 39.0–52.0)
HEMOGLOBIN: 9.1 g/dL — AB (ref 13.0–17.0)
Hemoglobin: 7.6 g/dL — ABNORMAL LOW (ref 13.0–17.0)
MCH: 30.3 pg (ref 26.0–34.0)
MCH: 30.4 pg (ref 26.0–34.0)
MCHC: 34.2 g/dL (ref 30.0–36.0)
MCHC: 34.3 g/dL (ref 30.0–36.0)
MCV: 88.3 fL (ref 78.0–100.0)
MCV: 88.8 fL (ref 78.0–100.0)
Platelets: 124 10*3/uL — ABNORMAL LOW (ref 150–400)
Platelets: 143 10*3/uL — ABNORMAL LOW (ref 150–400)
RBC: 3 MIL/uL — ABNORMAL LOW (ref 4.22–5.81)
RBC: 2.5 MIL/uL — ABNORMAL LOW (ref 4.22–5.81)
RDW: 13.6 % (ref 11.5–15.5)
RDW: 13.5 % (ref 11.5–15.5)
WBC: 22.9 10*3/uL — ABNORMAL HIGH (ref 4.0–10.5)
WBC: 30.2 10*3/uL — ABNORMAL HIGH (ref 4.0–10.5)

## 2015-01-21 LAB — CBC WITH DIFFERENTIAL/PLATELET
BASOS ABS: 0 10*3/uL (ref 0.0–0.1)
Basophils Relative: 0 % (ref 0–1)
EOS ABS: 0.3 10*3/uL (ref 0.0–0.7)
Eosinophils Relative: 1 % (ref 0–5)
HEMATOCRIT: 19.7 % — AB (ref 39.0–52.0)
Hemoglobin: 6.8 g/dL — CL (ref 13.0–17.0)
LYMPHS ABS: 3.4 10*3/uL (ref 0.7–4.0)
Lymphocytes Relative: 10 % — ABNORMAL LOW (ref 12–46)
MCH: 30.9 pg (ref 26.0–34.0)
MCHC: 34.5 g/dL (ref 30.0–36.0)
MCV: 89.5 fL (ref 78.0–100.0)
Monocytes Absolute: 0.7 10*3/uL (ref 0.1–1.0)
Monocytes Relative: 2 % — ABNORMAL LOW (ref 3–12)
Neutro Abs: 29.2 10*3/uL — ABNORMAL HIGH (ref 1.7–7.7)
Neutrophils Relative %: 87 % — ABNORMAL HIGH (ref 43–77)
PLATELETS: 143 10*3/uL — AB (ref 150–400)
RBC: 2.2 MIL/uL — ABNORMAL LOW (ref 4.22–5.81)
RDW: 13.7 % (ref 11.5–15.5)
WBC: 33.6 10*3/uL — AB (ref 4.0–10.5)

## 2015-01-21 LAB — POCT I-STAT 3, ART BLOOD GAS (G3+)
Acid-base deficit: 3 mmol/L — ABNORMAL HIGH (ref 0.0–2.0)
Bicarbonate: 21.7 mEq/L (ref 20.0–24.0)
O2 SAT: 100 %
PH ART: 7.409 (ref 7.350–7.450)
PO2 ART: 374 mmHg — AB (ref 80.0–100.0)
Patient temperature: 98.6
TCO2: 23 mmol/L (ref 0–100)
pCO2 arterial: 34.3 mmHg — ABNORMAL LOW (ref 35.0–45.0)

## 2015-01-21 LAB — PREPARE RBC (CROSSMATCH)

## 2015-01-21 LAB — BLOOD GAS, ARTERIAL
ACID-BASE DEFICIT: 4.3 mmol/L — AB (ref 0.0–2.0)
BICARBONATE: 18.1 meq/L — AB (ref 20.0–24.0)
DRAWN BY: 406621
FIO2: 0.21 %
O2 Saturation: 96.3 %
PCO2 ART: 21.7 mmHg — AB (ref 35.0–45.0)
PO2 ART: 69.4 mmHg — AB (ref 80.0–100.0)
Patient temperature: 98.6
TCO2: 18.7 mmol/L (ref 0–100)
pH, Arterial: 7.531 — ABNORMAL HIGH (ref 7.350–7.450)

## 2015-01-21 LAB — PHOSPHORUS: Phosphorus: 3.3 mg/dL (ref 2.5–4.6)

## 2015-01-21 LAB — LACTIC ACID, PLASMA
Lactic Acid, Venous: 4.3 mmol/L (ref 0.5–2.0)
Lactic Acid, Venous: 1.6 mmol/L (ref 0.5–2.0)

## 2015-01-21 LAB — PROCALCITONIN
PROCALCITONIN: 1.12 ng/mL
Procalcitonin: 1.33 ng/mL

## 2015-01-21 LAB — CALCIUM, IONIZED: Calcium, Ionized, Serum: 4.3 mg/dL — ABNORMAL LOW (ref 4.5–5.6)

## 2015-01-21 LAB — PROTIME-INR
INR: 1.52 — ABNORMAL HIGH (ref 0.00–1.49)
PROTHROMBIN TIME: 18.5 s — AB (ref 11.6–15.2)

## 2015-01-21 LAB — LIPASE, BLOOD: Lipase: 22 U/L (ref 22–51)

## 2015-01-21 LAB — MAGNESIUM: MAGNESIUM: 1.8 mg/dL (ref 1.7–2.4)

## 2015-01-21 LAB — TROPONIN I
TROPONIN I: 0.86 ng/mL — AB (ref <0.031)
Troponin I: 3.7 ng/mL (ref <0.031)

## 2015-01-21 LAB — APTT: aPTT: 28 seconds (ref 24–37)

## 2015-01-21 LAB — ABO/RH: ABO/RH(D): A POS

## 2015-01-21 SURGERY — EGD (ESOPHAGOGASTRODUODENOSCOPY)
Anesthesia: Moderate Sedation

## 2015-01-21 MED ORDER — SODIUM CHLORIDE 0.9 % IV SOLN
Freq: Once | INTRAVENOUS | Status: AC
  Administered 2015-01-21: 13:00:00 via INTRAVENOUS

## 2015-01-21 MED ORDER — LEVALBUTEROL HCL 1.25 MG/0.5ML IN NEBU
1.2500 mg | INHALATION_SOLUTION | Freq: Four times a day (QID) | RESPIRATORY_TRACT | Status: DC | PRN
  Filled 2015-01-21: qty 0.5

## 2015-01-21 MED ORDER — SODIUM CHLORIDE 0.9 % IV SOLN
INTRAVENOUS | Status: DC
  Administered 2015-01-21: 12:00:00 via INTRAVENOUS

## 2015-01-21 MED ORDER — DEXTROSE 5 % IV SOLN
2.0000 ug/min | INTRAVENOUS | Status: DC
  Administered 2015-01-21: 30 ug/min via INTRAVENOUS
  Administered 2015-01-22: 13 ug/min via INTRAVENOUS
  Filled 2015-01-21 (×2): qty 16

## 2015-01-21 MED ORDER — LIDOCAINE HCL (CARDIAC) 20 MG/ML IV SOLN
INTRAVENOUS | Status: AC
  Filled 2015-01-21: qty 5

## 2015-01-21 MED ORDER — ETOMIDATE 2 MG/ML IV SOLN
INTRAVENOUS | Status: AC
  Administered 2015-01-21: 10 mg
  Filled 2015-01-21: qty 20

## 2015-01-21 MED ORDER — SODIUM CHLORIDE 0.9 % IV BOLUS (SEPSIS)
500.0000 mL | Freq: Once | INTRAVENOUS | Status: AC
  Administered 2015-01-21: 500 mL via INTRAVENOUS

## 2015-01-21 MED ORDER — SODIUM CHLORIDE 0.9 % IV SOLN
8.0000 mg/h | INTRAVENOUS | Status: DC
  Administered 2015-01-21: 8 mg/h via INTRAVENOUS
  Filled 2015-01-21 (×3): qty 80

## 2015-01-21 MED ORDER — PANTOPRAZOLE SODIUM 40 MG IV SOLR
80.0000 mg | Freq: Once | INTRAVENOUS | Status: AC
  Administered 2015-01-21: 80 mg via INTRAVENOUS
  Filled 2015-01-21: qty 80

## 2015-01-21 MED ORDER — MIDAZOLAM HCL 2 MG/2ML IJ SOLN
INTRAMUSCULAR | Status: AC
  Administered 2015-01-21: 2 mg
  Filled 2015-01-21: qty 4

## 2015-01-21 MED ORDER — FENTANYL BOLUS VIA INFUSION
25.0000 ug | INTRAVENOUS | Status: DC | PRN
  Filled 2015-01-21: qty 25

## 2015-01-21 MED ORDER — MIDAZOLAM HCL 2 MG/2ML IJ SOLN
1.0000 mg | INTRAMUSCULAR | Status: DC | PRN
  Administered 2015-01-22 – 2015-01-23 (×4): 1 mg via INTRAVENOUS
  Filled 2015-01-21 (×4): qty 2

## 2015-01-21 MED ORDER — VANCOMYCIN HCL 10 G IV SOLR
1250.0000 mg | INTRAVENOUS | Status: DC
  Administered 2015-01-21 – 2015-01-23 (×3): 1250 mg via INTRAVENOUS
  Filled 2015-01-21 (×4): qty 1250

## 2015-01-21 MED ORDER — DEXTROSE 5 % IV SOLN
2.0000 g | INTRAVENOUS | Status: DC
  Administered 2015-01-21: 2 g via INTRAVENOUS
  Filled 2015-01-21: qty 2

## 2015-01-21 MED ORDER — ROCURONIUM BROMIDE 50 MG/5ML IV SOLN
INTRAVENOUS | Status: AC
  Administered 2015-01-21: 5 mg
  Filled 2015-01-21: qty 2

## 2015-01-21 MED ORDER — SODIUM CHLORIDE 0.9 % IV SOLN
8.0000 mg/h | INTRAVENOUS | Status: DC
  Administered 2015-01-21 – 2015-01-24 (×5): 8 mg/h via INTRAVENOUS
  Filled 2015-01-21 (×14): qty 80

## 2015-01-21 MED ORDER — SODIUM BICARBONATE 8.4 % IV SOLN
INTRAVENOUS | Status: AC
  Filled 2015-01-21: qty 50

## 2015-01-21 MED ORDER — SODIUM CHLORIDE 0.9 % IV SOLN
250.0000 mg | Freq: Four times a day (QID) | INTRAVENOUS | Status: DC
  Administered 2015-01-21 – 2015-01-30 (×35): 250 mg via INTRAVENOUS
  Filled 2015-01-21 (×36): qty 250

## 2015-01-21 MED ORDER — PANTOPRAZOLE SODIUM 40 MG IV SOLR: 40.0000 mg | Freq: Two times a day (BID) | INTRAVENOUS | Status: DC

## 2015-01-21 MED ORDER — PHENYLEPHRINE HCL 10 MG/ML IJ SOLN
30.0000 ug/min | INTRAVENOUS | Status: DC
  Filled 2015-01-21: qty 1

## 2015-01-21 MED ORDER — SODIUM CHLORIDE 0.9 % IV SOLN
500.0000 mg | Freq: Three times a day (TID) | INTRAVENOUS | Status: DC
  Administered 2015-01-21: 500 mg via INTRAVENOUS
  Filled 2015-01-21 (×3): qty 500

## 2015-01-21 MED ORDER — MIDAZOLAM HCL 10 MG/2ML IJ SOLN
INTRAMUSCULAR | Status: DC | PRN
  Administered 2015-01-21: 2 mg via INTRAVENOUS
  Administered 2015-01-21: 3 mg via INTRAVENOUS

## 2015-01-21 MED ORDER — SODIUM CHLORIDE 0.9 % IV SOLN
INTRAVENOUS | Status: DC
  Administered 2015-01-21 – 2015-01-22 (×2): via INTRAVENOUS
  Filled 2015-01-21 (×3): qty 1000

## 2015-01-21 MED ORDER — FENTANYL CITRATE (PF) 100 MCG/2ML IJ SOLN
50.0000 ug | Freq: Once | INTRAMUSCULAR | Status: AC
  Administered 2015-01-21: 50 ug via INTRAVENOUS

## 2015-01-21 MED ORDER — EPINEPHRINE HCL 0.1 MG/ML IJ SOSY
PREFILLED_SYRINGE | INTRAMUSCULAR | Status: AC
  Filled 2015-01-21: qty 20

## 2015-01-21 MED ORDER — SUCCINYLCHOLINE CHLORIDE 20 MG/ML IJ SOLN
INTRAMUSCULAR | Status: AC
  Filled 2015-01-21: qty 1

## 2015-01-21 MED ORDER — SODIUM CHLORIDE 0.9 % IV SOLN
INTRAVENOUS | Status: DC
  Administered 2015-01-21: 2 [IU]/h via INTRAVENOUS
  Filled 2015-01-21: qty 2.5

## 2015-01-21 MED ORDER — FENTANYL CITRATE (PF) 2500 MCG/50ML IJ SOLN
25.0000 ug/h | INTRAMUSCULAR | Status: DC
  Administered 2015-01-21: 75 ug/h via INTRAVENOUS
  Administered 2015-01-22: 150 ug/h via INTRAVENOUS
  Filled 2015-01-21 (×5): qty 50

## 2015-01-21 MED ORDER — NOREPINEPHRINE BITARTRATE 1 MG/ML IV SOLN
2.0000 ug/min | INTRAVENOUS | Status: DC
  Administered 2015-01-21: 10 ug/min via INTRAVENOUS
  Filled 2015-01-21: qty 4

## 2015-01-21 MED ORDER — POTASSIUM CHLORIDE CRYS ER 20 MEQ PO TBCR: 40.0000 meq | EXTENDED_RELEASE_TABLET | Freq: Three times a day (TID) | ORAL | Status: DC

## 2015-01-21 MED ORDER — MIDAZOLAM HCL 2 MG/2ML IJ SOLN: 1.0000 mg | INTRAMUSCULAR | Status: DC | PRN

## 2015-01-21 MED ORDER — FENTANYL CITRATE (PF) 100 MCG/2ML IJ SOLN
INTRAMUSCULAR | Status: AC
  Administered 2015-01-21: 100 ug
  Filled 2015-01-21: qty 4

## 2015-01-21 MED ORDER — PANTOPRAZOLE SODIUM 40 MG IV SOLR
40.0000 mg | Freq: Once | INTRAVENOUS | Status: AC
  Administered 2015-01-21: 40 mg via INTRAVENOUS
  Filled 2015-01-21: qty 40

## 2015-01-21 MED ORDER — MIDAZOLAM HCL 5 MG/ML IJ SOLN
INTRAMUSCULAR | Status: AC
  Filled 2015-01-21: qty 2

## 2015-01-21 NOTE — Consult Note (Signed)
PULMONARY / CRITICAL CARE MEDICINE   Name: [name] [name] MRN: [id number] DOB: November 28, 1940    ADMISSION DATE:  01/13/2015 CONSULTATION DATE:  01/21/15  REFERRING MD :  Dr. Susie Cassette  CHIEF COMPLAINT:  Pancreatitis, Hypotension   INITIAL PRESENTATION: 74 y/o M admitted 5/8 after a syncopal episode, epigastric/abdominal pain & nausea.    STUDIES:  5/09  TTE >> EF 30-40%, akinesis of basal-midinferior myocardium - severe hypokinesis lateral myocardium - grade 1 DD 5/12  LHC >> severe 3 vessel disease  5/13  CT ABD/Pelvis >> infiltration edema around pancreas, c/w acute pancreatitis, no abscess, cholelithiasis with stones in CBD, bilateral pleural effusions with compressive atx.   SIGNIFICANT EVENTS: 5/08  Transfer from [place] after syncopal episode, gallstones, pancreatitis, concern for NSTEMI, abnormal ECHO    HISTORY OF PRESENT ILLNESS:  74 y/o M with PMH of HTN, DM II, & cholelithiasis who presented to [facility] on 5/8 after a syncopal episode. Prior to the event, the patient had persistent epigastric abdominal pain with nausea.  The pain became worse in church and then he passed out.  Upon EMS arrival, he was bradycardic in the 30's with a SBP in the 60's.  He initially was worked up as an NSTEMI.  CT of the ABD showed CBD stone and pancreatitis.  Initial Labs - negative troponin, AST 247 / ALT 212, t. Bili 1.9, lactic acid 2.2, sr cr 1.7.  EKG without acute changes.    He was transferred to [facility] for further evaluation.  The patient was admitted per [facility].  He was placed on IV heparin per Cardiology with rising troponin and had an ECHO with concern for RWMA.  Syncope was felt to be related to vagal pain.  He developed AKI and was volume resuscitated.  He continued to have significant abdominal pain requiring morphine. 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  MRCP was completed which showed a questionable small CBD filling defect.  Abdominal pain slowly improved. He developed thrombocytopenia  and a HIT panel was assessed and negative.  He was stabilized from a cardiac / renal standpoint and underwent a cardiac catheterization on 5/12 which found severe 3 vessel disease prompting a CVTS consult.  Late in the evening 5/12, he developed worsening abdominal pain, nausea and vomiting concerning for ileus in the setting of acute pancreatitis.  CT was repeated which showed acute pancreatitis without fluid collection.  NGT was placed.  He was evaluated by CVTS and felt to be a candidate for CABG once recovered from gallstone pancreatitis.  He improved and NGT was removed. Overnight 5/15 into am of 5/16 he developed nausea with dark emesis and melena - having approximately 6 episodes of each.  Hgb dropped to 7.6 after one unit of PRBC's.  AM of 5/16 PCCM consulted for evaluation for hypotension.    PAST MEDICAL HISTORY :   has a past medical history of Hypertension; Diabetes mellitus type 2 in obese; and Cholelithiasis.  has past surgical history that includes Cardiac catheterization (N/A, 01/17/2015).    Prior to Admission medications   Medication Sig Start Date End Date Taking? Authorizing Provider  atorvastatin (LIPITOR) 10 MG tablet Take 10 mg by mouth daily.   Yes Historical Provider, MD  glipiZIDE (GLUCOTROL XL) 10 MG 24 hr tablet Take 10 mg by mouth 2 (two) times daily. 12/19/14  Yes Historical Provider, MD  latanoprost (XALATAN) 0.005 % ophthalmic solution Place 1 drop into both eyes at bedtime. 01/02/15  Yes Historical Provider, MD  lisinopril-hydrochlorothiazide (PRINZIDE,ZESTORETIC) 20-12.5  MG per tablet Take 1 tablet by mouth daily.   Yes Historical Provider, MD  sildenafil (VIAGRA) 100 MG tablet Take 100 mg by mouth daily as needed for erectile dysfunction.   Yes Historical Provider, MD  sitaGLIPtin (JANUVIA) 100 MG tablet Take 100 mg by mouth daily.   Yes Historical Provider, MD  vardenafil (LEVITRA) 20 MG tablet Take 20 mg by mouth daily as needed for erectile dysfunction.   Yes  Historical Provider, MD   No Known Allergies  FAMILY HISTORY:  has no family status information on file.    SOCIAL HISTORY:  reports that he has never smoked. He does not have any smokeless tobacco history on file. He reports that he does not drink alcohol or use illicit drugs.  REVIEW OF SYSTEMS:   Gen: Denies fever, chills, weight change, fatigue, night sweats HEENT: Denies blurred vision, double vision, hearing loss, tinnitus, sinus congestion, rhinorrhea, sore throat, neck stiffness, dysphagia PULM: Denies shortness of breath, cough, sputum production, hemoptysis, wheezing CV: Denies chest pain, edema, orthopnea, paroxysmal nocturnal dyspnea, palpitations GI: Denies diarrhea, hematochezia, constipation, change in bowel habits.  Reports melena, abdominal pain, nausea, vomiting GU: Denies dysuria, hematuria, polyuria, oliguria, urethral discharge Endocrine: Denies hot or cold intolerance, polyuria, polyphagia or appetite change Derm: Denies rash, dry skin, scaling or peeling skin change Heme: Denies easy bruising, bleeding, bleeding gums Neuro: Denies headache, numbness, weakness, slurred speech, loss of memory or consciousness   SUBJECTIVE:   VITAL SIGNS: Temp:  [97.6 F (36.4 C)-99.8 F (37.7 C)] 97.8 F (36.6 C) (05/16 0445) Pulse Rate:  [71-103] 103 (05/16 0830) Resp:  [22-28] 24 (05/16 0830) BP: (79-174)/(46-60) 79/50 mmHg (05/16 0830) SpO2:  [92 %-98 %] 98 % (05/16 0830)   HEMODYNAMICS:     VENTILATOR SETTINGS:     INTAKE / OUTPUT:  Intake/Output Summary (Last 24 hours) at 01/21/15 0857 Last data filed at 01/21/15 0730  Gross per 24 hour  Intake    565 ml  Output    951 ml  Net   -386 ml    PHYSICAL EXAMINATION: General: acutely ill appearing male in NAD  Neuro:  AAOx4, speech clear, MAE HEENT:  MM pale, dry, no jvd  Cardiovascular:  s1s2 rrr, tachy, no m/r/g  Lungs:  resp's even/non-labored, lungs bilaterally clear Abdomen:  Mild distention,  NTTP Musculoskeletal:  No acute deformities  Skin:  Warm/pale, dry  LABS:  CBC  Recent Labs Lab 01/20/15 2220 01/21/15 0005 01/21/15 0723  WBC 21.6* 22.9* 30.2*  HGB 9.6* 9.1* 7.6*  HCT 27.8* 26.5* 22.2*  PLT 125* 124* 143*   Coag's  Recent Labs Lab 01/20/15 2220  APTT 30  INR 1.22   BMET  Recent Labs Lab 01/19/15 0459 01/20/15 0338 01/21/15 0005  NA 149* 143 138  K 3.1* 2.7* 3.2*  CL 108 104 106  CO2 BUN 33* 46* 60*  CREATININE 1.58* 1.61* 1.71*  GLUCOSE 173* 246* 303*   Electrolytes  Recent Labs Lab 01/19/15 0459 01/20/15 0338 01/21/15 0005  CALCIUM 7.0* 6.7* 6.4*  MG 2.2 2.1 1.8  PHOS 1.7* 2.4* 3.3   Sepsis Markers No results for input(s): LATICACIDVEN, PROCALCITON, O2SATVEN in the last 168 hours.   ABG  Recent Labs Lab 01/18/15 0533  PHART 7.432  PCO2ART 31.2*  PO2ART 98.6   Liver Enzymes  Recent Labs Lab 01/19/15 0459 01/20/15 0338 01/21/15 0005  AST 38 31 30  ALT 48 37 32  ALKPHOS 73 60 55  BILITOT  2.2* 1.5* 1.1  ALBUMIN 2.3* 1.9* 1.6*   Cardiac Enzymes  Recent Labs Lab 01/15/15 0159 01/17/15 0249  TROPONINI 6.67* 1.47*   Glucose  Recent Labs Lab 01/19/15 2211 01/20/15 0810 01/20/15 1116 01/20/15 1631 01/20/15 2106 01/21/15 0619  GLUCAP 319* 228* 318* 231* 242* 283*    Imaging No results found.   ASSESSMENT / PLAN:  PULMONARY OETT 5/16 >>  A: Acute Respiratory Failure - in the setting of metabolic acidosis, acute pancreatitis Respiratory Alkalosis - compensation for metabolic acidosis  Bilateral Pleural Effusions with Compressive Atelectasis  P:   Now intubation Assess ABG, PCXR after placement  Monitor pleural space  MV support, 8 cc/kg   CARDIOVASCULAR CVL 5/16 >>  Aline 5/16 >>  A:  New dx CHF - EF 30-40% Hypovolemic / Septic Shock  SVT - resolved Hx HTN P:  Now EKG Trend troponin  Levophed to keep MAP > 65 NS bolus then @ KVO Volume resuscitation with PRBC's  Assess  CVP Q4  RENAL A:   Acute on Chronic Renal Insufficiency  Metabolic Acidosis - in setting of pancreatitis, volume loss with N/V, likely combined AG/NG process Hypokalemia  P:   Trend BMP  Replace electrolytes as indicated  Assess lactic acid   GASTROINTESTINAL A:   Acute Gallstone Pancreatitis  GIB - dark emesis + melena  Protein Calorie Malnutrition  P:   NPO  Insert OGT GI Following, planned EGD 5/16 pm PPI infusion  Consider TF in am   HEMATOLOGIC A:   Acute Blood Loss Anemia - s/p 1 unit 5/15, 5/16 P:  Trend CBC Additional 1 unit PRBC now Hold ASA, Heparin products SCD's for DVT prophylaxis   INFECTIOUS A:   Acute Gallstone Pancreatitis  P:   BCx2 5/16 >>   Vanco, start date 5/16, day 1/x Maxipime, 5/16 x1  Zosyn, 5/12 >> 5/13 Imipenem, start date 5/16, day 1/x   ENDOCRINE A:   DM  P:   Insulin gtt with acute pancreatitis   NEUROLOGIC A:   Pain  P:   RASS goal: -1 Fentanyl gtt for pain  PRN versed for sedation    FAMILY  - Updates: Wife, daughters updated at bedside     Canary Brim, NP-C [facility] Pgr: [phone] or [phone] 01/21/2015, 8:57 AM  Septic shock likely but hemorrhagic and cardiogenic are possible as well given history.  Patient is breathing very rapidly, somewhat clear on chest exam.  Lethargic and very pale.  Will transfer to the ICU, intubate, mechanically ventilate, f/u ABG and CXR.  Adjust vent for ABG.  Place on primaxin.  F/U on cultures.  Will transfuse as well.  GI saw and will likely EGD post intubation.  Cardiology recommending a CABG but only after patient's medical issues have been addressed.  Discussed with GI and cardiology.  The patient is critically ill with multiple organ systems failure and requires high complexity decision making for assessment and support, frequent evaluation and titration of therapies, application of advanced monitoring technologies and extensive interpretation of  multiple databases.   Critical Care Time devoted to patient care services described in this note is  45  Minutes. This time reflects time of care of this signee Dr Koren Bound. This critical care time does not reflect procedure time, or teaching time or supervisory time of PA/NP/Med student/Med Resident etc but could involve care discussion time.  Alyson Reedy, M.D. [facility] Pulmonary/Critical Care Medicine. Pager: [phone]. After hours pager: [phone].

## 2015-01-21 NOTE — Procedures (Signed)
Central Venous Catheter Insertion Procedure Note [name] [name] [id number] 31-Jul-1941  Procedure: Insertion of Central Venous Catheter Indications: Assessment of intravascular volume and Drug and/or fluid administration  Procedure Details Consent: Risks of procedure as well as the alternatives and risks of each were explained to the (patient/caregiver).  Consent for procedure obtained. Time Out: Verified patient identification, verified procedure, site/side was marked, verified correct patient position, special equipment/implants available, medications/allergies/relevent history reviewed, required imaging and test results available.  Performed  Maximum sterile technique was used including antiseptics, cap, gloves, gown, hand hygiene, mask and sheet. Skin prep: Chlorhexidine; local anesthetic administered A antimicrobial bonded/coated triple lumen catheter was placed in the left internal jugular vein using the Seldinger technique.  Evaluation Blood flow good Complications: No apparent complications Patient did tolerate procedure well. Chest X-ray ordered to verify placement.  CXR: pending.  U/S used in placement.  Terri Rorrer 01/21/2015, 11:10 AM

## 2015-01-21 NOTE — Progress Notes (Signed)
Talked with Dr. Molli Knock because can not get blood return on arterial line and no wave form. Respiratory aware and has tried to fix x 2. Will remove line and replace it. [name] [name]

## 2015-01-21 NOTE — Progress Notes (Cosign Needed)
    Progress Note   Subjective  no specific complaints. Asked to see urgently for bleeding.    Objective   Vital signs in last 24 hours: Temp:  [97.6 F (36.4 C)-99.8 F (37.7 C)] 97.8 F (36.6 C) (05/16 0445) Pulse Rate:  [71-120] 120 (05/16 0911) Resp:  [22-28] 26 (05/16 0911) BP: (73-174)/(41-60) 73/52 mmHg (05/16 0911) SpO2:  [91 %-98 %] 91 % (05/16 0911) Last BM Date: 01/20/15 General:    Pleasant white male in NAD.  Heart:  tachy Lungs: Respirations even and unlabored, lungs CTA bilaterally Abdomen:  Soft, nontender and nondistended. Normal bowel sounds. Extremities:  Without edema. Neurologic:  Alert and oriented,  grossly normal neurologically. Psych:  Cooperative. Normal mood and affect.  Lab Results:  Recent Labs  01/20/15 2220 01/21/15 0005 01/21/15 0723  WBC 21.6* 22.9* 30.2*  HGB 9.6* 9.1* 7.6*  HCT 27.8* 26.5* 22.2*  PLT 125* 124* 143*   BMET  Recent Labs  01/19/15 0459 01/20/15 0338 01/21/15 0005  NA 149* 143 138  K 3.1* 2.7* 3.2*  CL 108 104 106  CO2 28 28 24   GLUCOSE 173* 246* 303*  BUN 33* 46* 60*  CREATININE 1.58* 1.61* 1.71*  CALCIUM 7.0* 6.7* 6.4*   LFT  Recent Labs  01/21/15 0005  PROT 4.2*  ALBUMIN 1.6*  AST 30  ALT 32  ALKPHOS 55  BILITOT 1.1   PT/INR  Recent Labs  01/20/15 2220  LABPROT 15.5*  INR 1.22    Studies/Results: Dg Chest Port 1 View  01/21/2015   CLINICAL DATA:  Lethargy, shortness of breath, epigastric pain  EXAM: PORTABLE CHEST - 1 VIEW  COMPARISON:  01/19/2015  FINDINGS: Cardiomediastinal silhouette is stable. Persistent bilateral small pleural effusion with bilateral basilar atelectasis or infiltrate. No convincing pulmonary edema.  IMPRESSION: Persistent bilateral small pleural effusion with bilateral basilar atelectasis or infiltrate. No pulmonary edema.   Electronically Signed   By: Natasha Mead M.D.   On: 01/21/2015 08:57     Assessment / Plan:     1. GIB with hemodynamic instability. Patient  has been on TID SQ heparin, last dose was Friday around 2pm. Now with melena / hematemesis / hypotension and significant drop in hgb 11.9 >> 9.6 >>>9.1.  A unit of blood given early this am, post transfusion hgb only 7.6.    -Critical Care transferring him to unit for pressors. Plan is to intubate for airway protection. Patient needs EGD today when stabilized. This will be done at bedside.  -PPI drip started this am.  -Critical Care has ordered more blood  2. Gallstone pancreatitis /  ? "potential", non-obstructing CBD stone on MRCP. LFT have normalized.  3. CAD. Needs CABG when acute issues improve.    LOS: 8 days   Willette Cluster  01/21/2015, 9:36 AM

## 2015-01-21 NOTE — Procedures (Signed)
Arterial Catheter Insertion Procedure Note [name] [name] [id number] Feb 12, 1941  Procedure: Insertion of Arterial Catheter  Indications: [name] pressure monitoring and Frequent [name] sampling  Procedure Details Consent: Risks of procedure as well as the alternatives and risks of each were explained to the (patient/caregiver).  Consent for procedure obtained. Time Out: Verified patient identification, verified procedure, site/side was marked, verified correct patient position, special equipment/implants available, medications/allergies/relevent history reviewed, required imaging and test results available.  Performed  Maximum sterile technique was used including antiseptics, cap, gloves, gown, hand hygiene, mask and sheet. Skin prep: Chlorhexidine; local anesthetic administered 20 gauge catheter was inserted into right radial artery using the Seldinger technique.  Evaluation [name] flow good; BP tracing good. Complications: No apparent complications.   [name] [name] 01/21/2015

## 2015-01-21 NOTE — Procedures (Signed)
Intubation Procedure Note Jordann Loughlin [id number] 11/01/1940  Procedure: Intubation Indications: Respiratory insufficiency  Procedure Details Consent: Risks of procedure as well as the alternatives and risks of each were explained to the (patient/caregiver).  Consent for procedure obtained. Time Out: Verified patient identification, verified procedure, site/side was marked, verified correct patient position, special equipment/implants available, medications/allergies/relevent history reviewed, required imaging and test results available.  Performed  Maximum sterile technique was used including gloves, hand hygiene and mask.  MAC    Evaluation Hemodynamic Status: BP stable throughout; O2 sats: stable throughout Patient's Current Condition: stable Complications: No apparent complications Patient did tolerate procedure well. Chest X-ray ordered to verify placement.  CXR: pending.   Koren Bound 01/21/2015

## 2015-01-21 NOTE — Progress Notes (Signed)
Called to check on labs that were send to lab station 101 at 1520. Lab said they did not receive them. Will resend lactic acid and troponin once new arterial line established. Jacqulynn Cadet

## 2015-01-21 NOTE — Progress Notes (Addendum)
[place] TEAM 1 - Stepdown/ICU TEAM Progress Note  Cross Jorge BDZ:[id number] DOB: 10-13-1940 DOA: 01/13/2015 PCP: Monico Blitz, MD  Admit HPI / Brief Narrative: 74 yo male h/o HTN and DM who presented to [facility] ED after a syncopal episode.  He had been having persistent epigastric pain with associated nausea. The pain progressively got worse, and up to an 8/10 when he passed out. EMS noted he was bradycardic in the 30s with SBP documented in the mid 60s.He had previously been told he has a gallbladder full of stones but had been asymptomatic w/ no h/o pancreatitis.   In ED at [place] showed cbd stone and pancreatitis. Lip was >300 (no actual level).  AST/ALT 247/212 -  tbili 1.9 - alk phos normal. Lactic acid level 2.2 . Creat 1.7.  The pt was transferred to [facility].   HPI/Subjective:  last night he had an episode of acute blood loss likely melanotic stools with a significant drop in his hemoglobin currently down to 7.6. He has received 1 units packed red blood cells. This morning he was progressively worsening from the hypotension standpoint with blood pressures in the 70s to 80s after gentle volume replacement. Alkalotic, likely respiratory and has increased worker breathing. He appears extremely pale and fatigued.  Assessment/Plan: Acute blood loss anemia/hemorrhagic shock Transferred to ICU for  fluid resuscitation, may need vasopressors Critical care consulted Transfuse to keep hemoglobin above 9  intubation and central line placement Bedside EGD for  GI bleeding Minimal response to fluid resuscitation, initiated sepsis protocol as well  Acute blood loss anemia GI notified and they will do a bedside EGD  Acute respiratory failure Patient found to be hyperventilating and alkalotic Will be intubated by pccm , Chest x-ray shows bilateral pleural effusions Given worsening leukocytosis and concern for pneumonia started on broad-spectrum antibiotics for possible  sepsis  Acute Gallstone pancreatitis  MRCP suggests possible 4 mm stone within the common bile duct (though images not ideal ) - GI is following - lipase has normalized - LFTs are normal - total bili is slowly improving - no GI interventions planned in the near future, until CAD addressed   NSTEMI - CAD Cardiac cath revealed total RCA, total prox cfx, severe prox LAD, 50% dz of dominant diagonal - Cards following - will need eventual CABG - TCTS now following - patient will need to fully recover from his severe acute pancreatitis prior to surgery  SB ileus  Had significant difficulty with nausea and vomiting initially requiring NG tube - abdominal exam is now benign and patient is moving his bowels - will be high risk for recurrence - follow  Acute kidney injury  Worsening likely secondary to hypotensive shock, renal ultrasound unrevealing - avoid all potential nephrotoxins - continue volume resuscitation as above  Hypocalcemia Calcium corrects to 8.4 again - supplement an additional IV dose and follow  Persistent Hypokalemia Cont to supplement to goal of 4.0 - magnesium is normal - administer IV and oral  Hypophosphatemia Improving nicely with scheduled replacement  Syncope  Initially felt to be Vagal due to pain - no recurrence since admission   Bradycardia  Initially felt to be Vagal - has not recurred while inpatient   SVT  Responded to adenosine given at bedside with restoration of sinus rhythm - normal sinus rhythm at present   Newly diagnosed systolic congestive heart failure No ACE inhibitor due to acute kidney injury - on beta blocker - appears to be euvolemic at present - slow  IV fluid and follow   Leukocytosis  Felt to be due to inflammation of pancreatitis - no other sx presently to suggest infection - follow closely w/ low threshold to initiate abx if other sx of infection present themselves  Thrombocytopenia  HIT panel negative - suspect due to acute inflam  illness - no evidence of acute blood loss -  platelet count climbing/stable    DM CBG poorly controlled w/ diet resumption - adjust tx and follow trend   Nutrition Is tolerating his current diet without any difficulty - encouraged ongoing judicious oral intake  Code Status: FULL Family Communication: spoke with patient, wife, and daughters at bedside    Disposition Plan patient transferred to ICU  Consultants: GI Carlean Purl [facility]  TCTS  Procedures: TTE - 5/9 - EF 30-40% - akinesis of basal-midinferior myocardium - severe hypokinesis lateral myocardium - grade 1 DD Cardiac cath 5/12 - results noted above  Antibiotics: Zosyn 5/12 > 5/13  DVT prophylaxis: Subcutaneous heparin   Objective: Blood pressure 82/50, pulse 120, temperature 97.8 F (36.6 C), temperature source Oral, resp. rate 26, height 6' (1.829 m), weight 80.5 kg (177 lb 7.5 oz), SpO2 91 %.  Intake/Output Summary (Last 24 hours) at 01/21/15 1057 Last data filed at 01/21/15 0730  Gross per 24 hour  Intake    395 ml  Output    951 ml  Net   -556 ml   Exam: General: No acute respiratory distress - alert and oriented -  no NG Lungs: Clear to auscultation bilaterally without wheezes or crackles Cardiovascular: Regular rate and rhythm without murmur gallop or rub  Abdomen: non-tender in epigastrium,  mildly  protuberant, soft, bowel sounds positive, no rebound, no ascites, no appreciable mass Extremities: No significant cyanosis, clubbing, or edema bilateral lower extremities  Data Reviewed: Basic Metabolic Panel:  Recent Labs Lab 01/15/15 0159  01/17/15 2143 01/18/15 0237 01/19/15 0459 01/20/15 0338 01/21/15 0005  NA 137  < > 143 144 149* 143 138  K 4.1  < > 3.2* 3.3* 3.1* 2.7* 3.2*  CL 113*  < > 109 111 108 104 106  CO2 19*  < > 17* 20* 28 28 24   GLUCOSE 148*  < > 187* 204* 173* 246* 303*  BUN 30*  < > 42* 42* 33* 46* 60*  CREATININE 1.65*  < > 1.52* 1.52* 1.58* 1.61* 1.71*  CALCIUM 5.8*   < > 5.9* 6.0* 7.0* 6.7* 6.4*  MG 1.4*  --   --  1.9 2.2 2.1 1.8  PHOS 1.7*  --   --   --  1.7* 2.4* 3.3  < > = values in this interval not displayed.  CBC:  Recent Labs Lab 01/18/15 0237 01/19/15 0459 01/20/15 0338 01/20/15 2220 01/21/15 0005 01/21/15 0723  WBC 15.1* 16.0* 17.9* 21.6* 22.9* 30.2*  NEUTROABS 13.0* 13.8*  --   --   --   --   HGB 13.4 14.9 11.9* 9.6* 9.1* 7.6*  HCT 39.2 43.2 35.0* 27.8* 26.5* 22.2*  MCV 88.3 89.1 89.1 88.8 88.3 88.8  PLT 111* 151 140* 125* 124* 143*    Liver Function Tests:  Recent Labs Lab 01/17/15 2143 01/18/15 0237 01/19/15 0459 01/20/15 0338 01/21/15 0005  AST 38 36 38 31 30  ALT 52 49 48 37 32  ALKPHOS 62 52 73 60 55  BILITOT 2.4* 2.4* 2.2* 1.5* 1.1  PROT 5.4* 5.1* 6.7 4.9* 4.2*  ALBUMIN 2.2* 2.1* 2.3* 1.9* 1.6*    Recent Labs  Lab 01/15/15 0159 01/15/15 1130 01/18/15 0237  LIPASE 474*  --  35  AMYLASE  --  978* 121*    Coags:  Recent Labs Lab 01/20/15 2220 01/21/15 0930  INR 1.22 1.52*    Cardiac Enzymes:  Recent Labs Lab 01/15/15 0159 01/17/15 0249  TROPONINI 6.67* 1.47*    CBG:  Recent Labs Lab 01/20/15 0810 01/20/15 1116 01/20/15 1631 01/20/15 2106 01/21/15 0619  GLUCAP 228* 318* 231* 242* 283*    Recent Results (from the past 240 hour(s))  MRSA PCR Screening     Status: None   Collection Time: 01/13/15  6:33 PM  Result Value Ref Range Status   MRSA by PCR NEGATIVE NEGATIVE Final    Comment:        The GeneXpert MRSA Assay (FDA approved for NASAL specimens only), is one component of a comprehensive MRSA colonization surveillance program. It is not intended to diagnose MRSA infection nor to guide or monitor treatment for MRSA infections.   Clostridium Difficile by PCR     Status: None   Collection Time: 01/19/15  3:12 PM  Result Value Ref Range Status   C difficile by pcr NEGATIVE NEGATIVE Final     Studies:   Recent x-ray studies have been reviewed in detail by the Attending  Physician  Scheduled Meds:  Scheduled Meds: . sodium chloride   Intravenous Once  . antiseptic oral rinse  7 mL Mouth Rinse q12n4p  . chlorhexidine  15 mL Mouth Rinse BID  . EPINEPHrine      . etomidate      . fentaNYL      . fentaNYL (SUBLIMAZE) injection  50 mcg Intravenous Once  . latanoprost  1 drop Both Eyes QHS  . lidocaine (cardiac) 100 mg/77m      . midazolam      . pantoprazole (PROTONIX) IV  80 mg Intravenous Once  . [START ON 01/24/2015] pantoprazole (PROTONIX) IV  40 mg Intravenous Q12H  . phosphorus  500 mg Oral TID  . potassium chloride  40 mEq Oral TID  . rocuronium      . sodium bicarbonate      . sodium chloride  500 mL Intravenous Once  . succinylcholine      . vancomycin  1,250 mg Intravenous Q24H    Time spent on care of this patient: 374mins   AReyne Dumas, MD   [facility] Office  [phone] - Text Page per AShea Evansas per below:  On-Call/Text Page:      [email or web address]      password [email or web address]  If 7PM-7AM, please contact night-coverage [email or web address] Password [email or web address] 01/21/2015, 10:57 AM   LOS: 8 days

## 2015-01-21 NOTE — Significant Event (Signed)
Rapid Response Event Note  Overview: Time Called: 0903 Arrival Time: 0905 Event Type: Hypotension  Initial Focused Assessment:  Called by primary Rn to evaluate patient.  Upon my arrival to patients room, RN and family at bedside.  Patient lying in bed with nasal cannula 2lpm.  Patient is lethargic, very pale and weak.  Patient denies pain or SOB.  Patient has already received 1 l bolus prior to my arrival.  SBP 73/52, HR 120's ?? afib, RR 30, sats 97%.  Skin is clammy.  ABD distended, slightly firm, no pain.     Interventions:  ABG done prior to my arrival, EKG obtained.  Rechecked BP 84/49, HR 90's, Spoke with Dr. Molli Knock, will come to bedside.  Dr Susie Cassette at bedside, ordered another liter of NS.  Lab at bedside obtaining blood.  Merry Proud , NP at bedside.  BP 82/50, HR 90's, RR 28.  Family updated by CCM.     Event Summary:  Patient transported to 2H5.  Transported via bed, with zoll monitor and oxygen.  Primary RN reported to ICU RN   at      at          [facility], [place] Amos

## 2015-01-21 NOTE — Care Management Note (Signed)
Case Management Note  Patient Details  Name: [name] [name] MRN: [id number] Date of Birth: 1940/10/06  Subjective/Objective:     5/16 adm w ccu and intubateb-vent               Action/Plan:lives w wife, pcp dr a shah   Expected Discharge Date:                  Expected Discharge Plan:  Home w Home Health Services  In-House Referral:     Discharge planning Services     Post Acute Care Choice:    Choice offered to:     DME Arranged:    DME Agency:     HH Arranged:    HH Agency:     Status of Service:     Medicare Important Message Given:    Date Medicare IM Given:    Medicare IM give by:    Date Additional Medicare IM Given:    Additional Medicare Important Message give by:     If discussed at Long Length of Stay Meetings, dates discussed:    Additional Comments:5/16 ur ins review  Hanley Hays, RN 01/21/2015, 12:10 PM

## 2015-01-21 NOTE — Progress Notes (Signed)
RT note- called to room to assess arterial line, unable to draw. RN aware and MD paged for possible replacement.

## 2015-01-21 NOTE — Progress Notes (Addendum)
CRITICAL VALUE ALERT  Critical value received Calcium 6.4  Date of notification: 01/21/15  Time of notification: 0005  Critical value read back:Yes.    Nurse who received alert: Otilio Connors RN   MD notified :  Jimmye Norman, NP   Time of first page: 00:10 -sent text page with information regarding critical Calcium and results of hgb.

## 2015-01-21 NOTE — Procedures (Signed)
Arterial Catheter Insertion Procedure Note [name] [name] [id number] 1941-03-03  Procedure: Insertion of Arterial Catheter  Indications: Blood pressure monitoring  Procedure Details Consent: Risks of procedure as well as the alternatives and risks of each were explained to the (patient/caregiver).  Consent for procedure obtained. Time Out: Verified patient identification, verified procedure, site/side was marked, verified correct patient position, special equipment/implants available, medications/allergies/relevent history reviewed, required imaging and test results available.  Performed  Maximum sterile technique was used including antiseptics, cap, gloves, gown, hand hygiene, mask and sheet. Skin prep: Chlorhexidine; local anesthetic administered 20 gauge catheter was inserted into left radial artery using the Seldinger technique.  Evaluation Blood flow good; BP tracing good. Complications: No apparent complications.   [name] [name] 01/21/2015

## 2015-01-21 NOTE — Progress Notes (Signed)
ANTIBIOTIC CONSULT NOTE - INITIAL  Pharmacy Consult for Vancomycin/cefepime Indication: rule out sepsis  No Known Allergies  Patient Measurements: Height: 6' (182.9 cm) Weight: 177 lb 7.5 oz (80.5 kg) IBW/kg (Calculated) : 77.6  Vital Signs: Temp: 97.8 F (36.6 C) (05/16 0445) Temp Source: Oral (05/16 0445) BP: 84/59 mmHg (05/16 0931) Pulse Rate: 120 (05/16 0911) Intake/Output from previous day: 05/15 0701 - 05/16 0700 In: 850 [P.O.:200; I.V.:305; Blood:345] Out: 401 [Urine:400; Stool:1] Intake/Output from this shift: Total I/O In: 0  Out: 550 [Urine:550]  Labs:  Recent Labs  01/19/15 0459 01/20/15 0338 01/20/15 2220 01/21/15 0005 01/21/15 0723  WBC 16.0* 17.9* 21.6* 22.9* 30.2*  HGB 14.9 11.9* 9.6* 9.1* 7.6*  PLT 151 140* 125* 124* 143*  CREATININE 1.58* 1.61*  --  1.71*  --    Estimated Creatinine Clearance: 42.9 mL/min (by C-G formula based on Cr of 1.71). No results for input(s): VANCOTROUGH, VANCOPEAK, VANCORANDOM, GENTTROUGH, GENTPEAK, GENTRANDOM, TOBRATROUGH, TOBRAPEAK, TOBRARND, AMIKACINPEAK, AMIKACINTROU, AMIKACIN in the last 72 hours.   Microbiology: Recent Results (from the past 720 hour(s))  MRSA PCR Screening     Status: None   Collection Time: 01/13/15  6:33 PM  Result Value Ref Range Status   MRSA by PCR NEGATIVE NEGATIVE Final    Comment:        The GeneXpert MRSA Assay (FDA approved for NASAL specimens only), is one component of a comprehensive MRSA colonization surveillance program. It is not intended to diagnose MRSA infection nor to guide or monitor treatment for MRSA infections.   Clostridium Difficile by PCR     Status: None   Collection Time: 01/19/15  3:12 PM  Result Value Ref Range Status   C difficile by pcr NEGATIVE NEGATIVE Final    Medical History: Past Medical History  Diagnosis Date  . Hypertension   . Diabetes mellitus type 2 in obese   . Cholelithiasis     Assessment: 21 YOM admitted on 5/8 for syncopal  episode and acute gallstone pancreatitis. S/p cath, will need CABG after acute pancreatitis resolves. Pt. Is worsened overnight. With elevated wbc (30.2K), hypotension and tachycardia. Pharmacy is consulted to start vancomycin/cefepime empirically to cover sepsis. Scr slightly trending up 1.71, with est. Crcl ~ 40 ml/min. Lab is in pt room drawing blood cultures.  Cefepime 5/16 >> Vancomycin 5/16 >> Zosyn 5/12 >> 5/13  5/14 C-diff - neg 5/16 blood x 2  Goal of Therapy:  Vancomycin trough level 15-20 mcg/ml  Plan:  - Vancomycin 1250 mg IV Q 24 hrs - Cefepime 2g IV Q 24 hrs - Monitor renal function and f/u cultures - Vancomycin trough at steady state.  Bayard Hugger, PharmD, BCPS  Clinical Pharmacist  Pager: [phone]   01/21/2015,9:45 AM

## 2015-01-21 NOTE — Progress Notes (Signed)
HISTORY OF PRESENT ILLNESS:  [name] [name] is a 74 y.o. male with multiple significant medical problems including three-vessel coronary disease, recent MI, and gallstone pancreatitis. Found develop significant acute GI bleeding with hypotension. Hematemesis and melena. He had on heparin. Plans for emergency endoscopy in intensive care unit now.  REVIEW OF SYSTEMS:  All non-GI ROS not relevant  Past Medical History  Diagnosis Date  . Hypertension   . Diabetes mellitus type 2 in obese   . Cholelithiasis     Past Surgical History  Procedure Laterality Date  . Cardiac catheterization N/A 01/17/2015    Procedure: Left Heart Cath and Coronary Angiography;  Surgeon: Lyn Records, MD;  Location: [facility] INVASIVE CV LAB;  Service: Cardiovascular;  Laterality: N/A;    Social History [name] [name]  reports that he has never smoked. He does not have any smokeless tobacco history on file. He reports that he does not drink alcohol or use illicit drugs.  family history includes Heart attack (age of onset: 74) in his brother; Pancreatic cancer (age of onset: 6) in his sister.  No Known Allergies     PHYSICAL EXAMINATION: Vital signs: BP 136/49 mmHg  Pulse 82  Temp(Src) 99.3 F (37.4 C) (Axillary)  Resp 18  Ht 6' (1.829 m)  Wt 177 lb 7.5 oz (80.5 kg)  BMI 24.06 kg/m2  SpO2 100% General: Sedated and intubated HEENT: Sclerae are anicteric, conjunctiva pink. OG-tube Lungs: Clear Heart: Regular Abdomen: soft, nontender, nondistended, no obvious ascites, no peritoneal signs, normal bowel sounds. No organomegaly. Extremities: Trace edema Psychiatric: Sedated    ASSESSMENT:  #1. Acute upper GI bleed with hypotension #2. Multiple acute medical problems in addition   PLAN:  #1. Emergent EGD with possible hemostasis. Consent signed by the family. High risk patient

## 2015-01-21 NOTE — Progress Notes (Addendum)
ANTIBIOTIC CONSULT NOTE - FOLLOW UP  Pharmacy Consult for Imipenem & Vancomycin Indication: rule out sepsis  No Known Allergies  Patient Measurements: Height: 6' (182.9 cm) Weight: 177 lb 7.5 oz (80.5 kg) IBW/kg (Calculated) : 77.6  Vital Signs: Temp: 97.8 F (36.6 C) (05/16 0445) Temp Source: Oral (05/16 0445) BP: 82/50 mmHg (05/16 0951) Pulse Rate: 120 (05/16 0911) Intake/Output from previous day: 05/15 0701 - 05/16 0700 In: 850 [P.O.:200; I.V.:305; Blood:345] Out: 401 [Urine:400; Stool:1] Intake/Output from this shift: Total I/O In: 0  Out: 550 [Urine:550]  Labs:  Recent Labs  01/20/15 0338 01/20/15 2220 01/21/15 0005 01/21/15 0723 01/21/15 0930  WBC 17.9* 21.6* 22.9* 30.2*  --   HGB 11.9* 9.6* 9.1* 7.6*  --   PLT 140* 125* 124* 143*  --   CREATININE 1.61*  --  1.71*  --  2.28*   Estimated Creatinine Clearance: 32.1 mL/min (by C-G formula based on Cr of 2.28). No results for input(s): VANCOTROUGH, VANCOPEAK, VANCORANDOM, GENTTROUGH, GENTPEAK, GENTRANDOM, TOBRATROUGH, TOBRAPEAK, TOBRARND, AMIKACINPEAK, AMIKACINTROU, AMIKACIN in the last 72 hours.   Microbiology: Recent Results (from the past 720 hour(s))  MRSA PCR Screening     Status: None   Collection Time: 01/13/15  6:33 PM  Result Value Ref Range Status   MRSA by PCR NEGATIVE NEGATIVE Final    Comment:        The GeneXpert MRSA Assay (FDA approved for NASAL specimens only), is one component of a comprehensive MRSA colonization surveillance program. It is not intended to diagnose MRSA infection nor to guide or monitor treatment for MRSA infections.   Clostridium Difficile by PCR     Status: None   Collection Time: 01/19/15  3:12 PM  Result Value Ref Range Status   C difficile by pcr NEGATIVE NEGATIVE Final    Anti-infectives    Start     Dose/Rate Route Frequency Ordered Stop   01/21/15 1400  imipenem-cilastatin (PRIMAXIN) 500 mg in sodium chloride 0.9 % 100 mL IVPB     500 mg 200 mL/hr over  30 Minutes Intravenous 3 times per day 01/21/15 1059     01/21/15 1000  vancomycin (VANCOCIN) 1,250 mg in sodium chloride 0.9 % 250 mL IVPB     1,250 mg 166.7 mL/hr over 90 Minutes Intravenous Every 24 hours 01/21/15 0859     01/21/15 0900  ceFEPIme (MAXIPIME) 2 g in dextrose 5 % 50 mL IVPB  Status:  Discontinued     2 g 100 mL/hr over 30 Minutes Intravenous Every 24 hours 01/21/15 0859 01/21/15 1049   01/18/15 0800  piperacillin-tazobactam (ZOSYN) IVPB 3.375 g  Status:  Discontinued     3.375 g 12.5 mL/hr over 240 Minutes Intravenous Every 8 hours 01/17/15 2237 01/18/15 1454   01/17/15 2245  piperacillin-tazobactam (ZOSYN) IVPB 3.375 g     3.375 g 100 mL/hr over 30 Minutes Intravenous STAT 01/17/15 2236 01/17/15 2323      Assessment: 74yo male experienced syncopal event, tx'd from [facility] for acute gallstone pancreatitis, also noted w/ AKI.  Elevated wbc (30.2K), afebrile, hypotensive, LA 4.3 elevated, and tachycardic. Pharmacy is consulted to dose vancomycin and impenem to rule out sepsis.  Imipenem 5/16 >> Vancomycin 5/16 >>  Cefepime 5/16 >> 5/16 Zosyn 5/12 >> 5/13   5/14 C-diff - neg  5/16 blood x 2  Plan:  - Imipenem 500 mg IV Q8H - Continue - Vancomycin 1250 mg IV Q 24 hrs - Monitor for clinical efficacy and renal function -  Follow up cultures  Red Christians, Pharm. D. Clinical Pharmacy Resident Pager: [phone] Ph: [phone] 01/21/2015 11:07 AM

## 2015-01-21 NOTE — Progress Notes (Addendum)
Patient ID: [name] [name], male   DOB: 1941-07-03, 74 y.o.   MRN: [id number]   74 yo male h/o HTN and DM who presented to [facility] ED after a syncopal episode. He had been having persistent epigastric pain with associated nausea. The pain progressively got worse, and up to an 8/10 when he passed out. EMS noted he was bradycardic in the 30s with SBP documented in the mid 60s.He had previously been told he has a gallbladder full of stones but had been asymptomatic w/ no h/o pancreatitis.   In ED at [facility] showed cbd stone and pancreatitis. Lip was >300 (no actual level). AST/ALT 247/212 - tbili 1.9 - alk phos normal. Lactic acid level 2.2 . Creat 1.7. The pt was transferred to [facility]. He is noted to have acute gallstone pancreatitis.  Upon initial evaluation the also positive troponin levels. He underwent cardiac catheterization revealing multivessel CAD with ischemic cardiomyopathy.   Echocardiogram 01/14/2015: EF 30-40% with akinesis of the basal-midinferior myocardium with severe HK of the lateral myocardium. Akinesis of the inferolateral myocardium. Grade 1 DD.  Cardiac catheterization 01/17/2015: Severe multivessel CAD with 100% RCA and proximal circumflex. Severe proximal LAD lesion with 50% D1 lesion. Recommendation is to consider CABG as there are no good PCI options. This would be delayed following treatment of pancreatitis/gallbladder disease.  SUBJECTIVE:   He was transferred out of the ICU yesterday. While calcium was feeling better and was anxious to go home. He did not have any further shortness of breath or chest pain. His blood pressures are actually improved and his afterload reduction with nitrates and hydralazine was increased in conjunction with his carvedilol.Marland Kitchen Unfortunately last night he had an episode of acute blood loss likely melanotic stools with a significant drop in his hemoglobin currently down to 7.6. He has received 2 units packed red blood cells. This  morning he was progressively worsening from the hypotension standpoint with blood pressures in the 70s to 80s after gentle volume replacement. Alkalotic, likely respiratory and has increased worker breathing. He appears extremely pale and fatigued. Upon my evaluation this morning, PCCM was consulted for ICU transfer  PHYSICAL EXAM Filed Vitals:   01/21/15 0921 01/21/15 0931 01/21/15 0941 01/21/15 0951  BP: _0 82/50  Pulse:      Temp:      TempSrc:      Resp:      Height:      Weight:      SpO2:       General:  Pale, increased worker breathing. Denies chest pain but appears to be in moderate distress. HEENT: Walker/AT,EOMI Lungs:  Mild bibasal rhonchi/rales with increased worker breathing. Heart:  Tachycardic with a regular rhythm. Normal S1 and S2 with no obvious M/R/G. Abdomen:  Distended with decreased breath sounds, tender to palpation. Extremities:  No edema Neuro: Inches questions appropriately and moves all 4. However is extremely weak and fatigued.  LABS:  Results for orders placed or performed during the hospital encounter of 01/13/15 (from the past 24 hour(s))  Glucose, capillary     Status: Abnormal   Collection Time: 01/20/15 11:16 AM  Result Value Ref Range   Glucose-Capillary 318 (H) 65 - 99 mg/dL   Comment 1 Capillary Specimen   Glucose, capillary     Status: Abnormal   Collection Time: 01/20/15  4:31 PM  Result Value Ref Range   Glucose-Capillary 231 (H) 65 - 99 mg/dL  Occult blood card to lab, stool  Status: Abnormal   Collection Time: 01/20/15  7:20 PM  Result Value Ref Range   Fecal Occult Bld POSITIVE (A) NEGATIVE  Glucose, capillary     Status: Abnormal   Collection Time: 01/20/15  9:06 PM  Result Value Ref Range   Glucose-Capillary 242 (H) 65 - 99 mg/dL  CBC     Status: Abnormal   Collection Time: 01/20/15 10:20 PM  Result Value Ref Range   WBC 21.6 (H) 4.0 - 10.5 K/uL   RBC 3.13 (L) 4.22 - 5.81 MIL/uL   Hemoglobin 9.6 (L) 13.0 - 17.0  g/dL   HCT 27.8 (L) 39.0 - 52.0 %   MCV 88.8 78.0 - 100.0 fL   MCH 30.7 26.0 - 34.0 pg   MCHC 34.5 30.0 - 36.0 g/dL   RDW 13.7 11.5 - 15.5 %   Platelets 125 (L) 150 - 400 K/uL  APTT     Status: None   Collection Time: 01/20/15 10:20 PM  Result Value Ref Range   aPTT 30 24 - 37 seconds  Protime-INR     Status: Abnormal   Collection Time: 01/20/15 10:20 PM  Result Value Ref Range   Prothrombin Time 15.5 (H) 11.6 - 15.2 seconds   INR 1.22 0.00 - 1.49  Comprehensive metabolic panel     Status: Abnormal   Collection Time: 01/21/15 12:05 AM  Result Value Ref Range   Sodium 138 135 - 145 mmol/L   Potassium 3.2 (L) 3.5 - 5.1 mmol/L   Chloride 106 101 - 111 mmol/L   CO2 24 22 - 32 mmol/L   Glucose, Bld 303 (H) 65 - 99 mg/dL   BUN 60 (H) 6 - 20 mg/dL   Creatinine, Ser 1.71 (H) 0.61 - 1.24 mg/dL   Calcium 6.4 (LL) 8.9 - 10.3 mg/dL   Total Protein 4.2 (L) 6.5 - 8.1 g/dL   Albumin 1.6 (L) 3.5 - 5.0 g/dL   AST 30 15 - 41 U/L   ALT 32 17 - 63 U/L   Alkaline Phosphatase 55 38 - 126 U/L   Total Bilirubin 1.1 0.3 - 1.2 mg/dL   GFR calc non Af Amer 38 (L) >60 mL/min   GFR calc Af Amer 44 (L) >60 mL/min   Anion gap 8 5 - 15  Magnesium     Status: None   Collection Time: 01/21/15 12:05 AM  Result Value Ref Range   Magnesium 1.8 1.7 - 2.4 mg/dL  Phosphorus     Status: None   Collection Time: 01/21/15 12:05 AM  Result Value Ref Range   Phosphorus 3.3 2.5 - 4.6 mg/dL  CBC     Status: Abnormal   Collection Time: 01/21/15 12:05 AM  Result Value Ref Range   WBC 22.9 (H) 4.0 - 10.5 K/uL   RBC 3.00 (L) 4.22 - 5.81 MIL/uL   Hemoglobin 9.1 (L) 13.0 - 17.0 g/dL   HCT 26.5 (L) 39.0 - 52.0 %   MCV 88.3 78.0 - 100.0 fL   MCH 30.3 26.0 - 34.0 pg   MCHC 34.3 30.0 - 36.0 g/dL   RDW 13.6 11.5 - 15.5 %   Platelets 124 (L) 150 - 400 K/uL  Type and screen     Status: None (Preliminary result)   Collection Time: 01/21/15 12:05 AM  Result Value Ref Range   ABO/RH(D) A POS    Antibody Screen NEG     Sample Expiration 01/24/2015    Unit Number [id number]    Blood Component Type RBC  LR PHER2    Unit division 00    Status of Unit ISSUED    Transfusion Status OK TO TRANSFUSE    Crossmatch Result Compatible    Unit Number [id number]    Blood Component Type RED CELLS,LR    Unit division 00    Status of Unit ALLOCATED    Transfusion Status OK TO TRANSFUSE    Crossmatch Result Compatible    Unit Number [id number]    Blood Component Type RED CELLS,LR    Unit division 00    Status of Unit ALLOCATED    Transfusion Status OK TO TRANSFUSE    Crossmatch Result Compatible    Unit Number [id number]    Blood Component Type RED CELLS,LR    Unit division 00    Status of Unit ALLOCATED    Transfusion Status OK TO TRANSFUSE    Crossmatch Result Compatible   ABO/Rh     Status: None   Collection Time: 01/21/15 12:05 AM  Result Value Ref Range   ABO/RH(D) A POS   Prepare RBC     Status: None   Collection Time: 01/21/15 12:25 AM  Result Value Ref Range   Order Confirmation ORDER PROCESSED BY BLOOD BANK   Glucose, capillary     Status: Abnormal   Collection Time: 01/21/15  6:19 AM  Result Value Ref Range   Glucose-Capillary 283 (H) 65 - 99 mg/dL  CBC     Status: Abnormal   Collection Time: 01/21/15  7:23 AM  Result Value Ref Range   WBC 30.2 (H) 4.0 - 10.5 K/uL   RBC 2.50 (L) 4.22 - 5.81 MIL/uL   Hemoglobin 7.6 (L) 13.0 - 17.0 g/dL   HCT 22.2 (L) 39.0 - 52.0 %   MCV 88.8 78.0 - 100.0 fL   MCH 30.4 26.0 - 34.0 pg   MCHC 34.2 30.0 - 36.0 g/dL   RDW 13.5 11.5 - 15.5 %   Platelets 143 (L) 150 - 400 K/uL  Blood gas, arterial     Status: Abnormal   Collection Time: 01/21/15  8:54 AM  Result Value Ref Range   FIO2 0.21 %   pH, Arterial 7.531 (H) 7.350 - 7.450   pCO2 arterial 21.7 (L) 35.0 - 45.0 mmHg   pO2, Arterial 69.4 (L) 80.0 - 100.0 mmHg   Bicarbonate 18.1 (L) 20.0 - 24.0 mEq/L   TCO2 18.7 0 - 100 mmol/L   Acid-base deficit 4.3 (H) 0.0 - 2.0 mmol/L   O2 Saturation 96.3  %   Patient temperature 98.6    Collection site RIGHT RADIAL    Drawn by [id number]    Sample type ARTERIAL DRAW    Allens test (pass/fail) PASS PASS  Prepare RBC     Status: None   Collection Time: 01/21/15  9:46 AM  Result Value Ref Range   Order Confirmation ORDER PROCESSED BY BLOOD BANK     Intake/Output Summary (Last 24 hours) at 01/21/15 1037 Last data filed at 01/21/15 0730  Gross per 24 hour  Intake    395 ml  Output    951 ml  Net   -556 ml    ASSESSMENT AND PLAN: Principal Problem:   Acute gallstone pancreatitis Active Problems:   Acute biliary pancreatitis   CAD, multiple vessel: CTO of RCA, Cx with severe prox LAD disease   Acute systolic CHF (congestive heart failure)   Acute blood loss anemia   Hemorrhagic shock   NSTEMI (non-ST elevated myocardial infarction)   Hypocalcemia  Abdominal pain, epigastric   Nausea with vomiting   Elevated LFTs   Gallstones   Syncope   Hypotension   Bradycardia   AKI (acute kidney injury)   Diabetes   Pancreatitis   Cardiomyopathy   Choledocholithiasis   Ileus   Leukocytosis   Syncope and collapse   Acute systolic congestive heart failure   Essential hypertension   Other specified hypotension   Acute kidney injury   Diabetes type 2, uncontrolled   Calculus of gallbladder with acute cholecystitis without obstruction   Cholecystitis, acute   Metabolic acidosis   Acute renal failure syndrome  hypocalcemia  Hemorrhagic Shock/Acute Blood Loss Anemia: Agree with transfer to CCU for volume resuscitation. Also would continue blood transfusions to get a hemoglobin at least above 8 preferably above 9. He does have a severely reduced EF and therefore would avoid overhydration. Plan is for intubation and central line placement with volume resuscitation/blood transfusion.  Would preferentially use vasopressors for blood pressure support. The plan is to evaluate CVP determine if any cardiovascular support is required. His current  inflammatory/borderline sepsis picture, our options are limited, however we could consider invasive support if he is not able to be maintained with vasopressors.   CAD:  Three vessel by cath.  He will need CABG once other issues improve.       CKD:   Creat --as expected has increased with hypotension and anemia.  HYPOKALEMIA/hypocalcemia:  He has had consistently low potassium levels as well as calcium levels. These would need to be resuscitated and repleted to avoid any arrhythmogenic, locations   CARDIOMYOPATHY:  EF 30 - 40%.    In light of hypotension/shock, I have discontinued beta blocker and hydralazine/nitrates.   He seems to be if anything hypovolemic and therefore would not required Lasix for now. May need diuresis after he is aggressively resuscitated.    Ultimately the patient will need CABG for revascularization. He has been stable up until last night from a cardiac standpoint and currently denies any chest tightness or pressure. He will need to have his pancreatitis/choledocholithiasis/cholecystitis resolve/medically treated before he would be considered to be a candidate for CABG.  For now supportive care is the best course of action.  We'll follow-up later on today to see how he is progressing   [place]elow, Khrystina Bonnes W 01/21/2015 10:37 AM

## 2015-01-21 NOTE — Progress Notes (Signed)
Upon Assessment patient labored breathing, pale, lethargic, and diaphoretic. BP 79/52. Patient states no SOB and no pain. MD notified of patients condition. STAT chest xray, ABG, lactic acid, vitals every 10 minutes and 500 cc bolus ordered. Family at bedside. Will continue to monitor.  Valinda Hoar RN

## 2015-01-21 NOTE — Progress Notes (Signed)
Talked with infectious disease nurse and she said patient needs to be on contact per protocol. C. Diff is negative but GI panel is not back from the lab yet. Talked with Dr. Marina Goodell and he says he does not need to be on contact because the diarrhea is not from an infectious source but instead from a bleed. Will continue to monitor.  [name] [name]

## 2015-01-21 NOTE — Brief Op Note (Signed)
01/13/2015 - 01/21/2015  4:00 PM  PATIENT:  [name] [name]  74 y.o. male  PRE-OPERATIVE DIAGNOSIS:  melena, hematemesis  POST-OPERATIVE DIAGNOSIS:  ulcerative esophigitis  PROCEDURE:  Procedure(s) with comments: ESOPHAGOGASTRODUODENOSCOPY (EGD) (N/A) - to be done at BEDSIDE   Exam: Please see [facility] report for details and photographs. EGD revealed severe, grade 4, esophagitis with deep ulcers, eschar, and friability throughout. No significant active bleeding or focal vascular lesion. Blood clots obscured a small portion of the fundus, but the stomach was otherwise normal. The duodenum revealed moderate edema and the second portion (consistent with recent pancreatitis) was otherwise normal.  RECOMMEND 1. Keep OG tube out 2. Continue IV PPI continuous infusion 3. Hold heparin for now. Not sure when we can restart. The esophagitis is quite severe 4. Transfuse as needed  Discussed with family.  SURGEON:      * Hilarie Fredrickson, MD -

## 2015-01-21 NOTE — Progress Notes (Signed)
OT Cancellation Note  Patient Details Name: [name] [name] MRN: [id number] DOB: [birth year]   Cancelled Treatment:    Reason Eval/Treat Not Completed: Other (comment). Pt recently transferred from 2W to 2H. Will hold OT evaluation for now.  Earlie Raveling OTR/L [phone] 01/21/2015, 10:04 AM

## 2015-01-21 NOTE — Progress Notes (Signed)
PT Cancellation Note  Patient Details Name: [name] [name] MRN: [id number] DOB: 02-06-1941   Cancelled Treatment:    Reason Eval/Treat Not Completed: Medical issues which prohibited therapy. Noted transfer to 2H due to GI bleed with anticipated need for intubation. Will follow and proceed with PT if appropriate.   [name] 01/21/2015, 10:12 AM Pager [phone]

## 2015-01-21 NOTE — Progress Notes (Signed)
Patient breathing rapidly and labored. BP systolic 80's. second 500cc bolus ordered. HGB 7.6. MD at bedside. Rapid response notified. GI and Critical care MD notified. EKG ordered. Will continue to monitor.   Valinda Hoar RN

## 2015-01-22 ENCOUNTER — Inpatient Hospital Stay (HOSPITAL_COMMUNITY): Payer: Medicare Other

## 2015-01-22 ENCOUNTER — Encounter (HOSPITAL_COMMUNITY): Payer: Self-pay | Admitting: Internal Medicine

## 2015-01-22 DIAGNOSIS — J9601 Acute respiratory failure with hypoxia: Secondary | ICD-10-CM

## 2015-01-22 DIAGNOSIS — R579 Shock, unspecified: Secondary | ICD-10-CM

## 2015-01-22 DIAGNOSIS — K920 Hematemesis: Secondary | ICD-10-CM

## 2015-01-22 DIAGNOSIS — D62 Acute posthemorrhagic anemia: Secondary | ICD-10-CM

## 2015-01-22 DIAGNOSIS — J189 Pneumonia, unspecified organism: Secondary | ICD-10-CM

## 2015-01-22 DIAGNOSIS — I248 Other forms of acute ischemic heart disease: Secondary | ICD-10-CM | POA: Insufficient documentation

## 2015-01-22 DIAGNOSIS — R110 Nausea: Secondary | ICD-10-CM

## 2015-01-22 DIAGNOSIS — K2210 Ulcer of esophagus without bleeding: Secondary | ICD-10-CM

## 2015-01-22 LAB — GI PATHOGEN PANEL BY PCR, STOOL
C difficile toxin A/B: NOT DETECTED
CAMPYLOBACTER BY PCR: NOT DETECTED
Cryptosporidium by PCR: NOT DETECTED
E coli (ETEC) LT/ST: NOT DETECTED
E coli (STEC): NOT DETECTED
E coli 0157 by PCR: NOT DETECTED
G LAMBLIA BY PCR: NOT DETECTED
Norovirus GI/GII: NOT DETECTED
Rotavirus A by PCR: NOT DETECTED
SHIGELLA BY PCR: NOT DETECTED
Salmonella by PCR: NOT DETECTED

## 2015-01-22 LAB — BLOOD GAS, ARTERIAL
ACID-BASE DEFICIT: 5.8 mmol/L — AB (ref 0.0–2.0)
BICARBONATE: 17.6 meq/L — AB (ref 20.0–24.0)
Drawn by: 41977
FIO2: 0.5 %
MECHVT: 620 mL
O2 SAT: 99.8 %
PEEP: 5 cmH2O
Patient temperature: 98.6
RATE: 16 resp/min
TCO2: 18.4 mmol/L (ref 0–100)
pCO2 arterial: 26.2 mmHg — ABNORMAL LOW (ref 35.0–45.0)
pH, Arterial: 7.44 (ref 7.350–7.450)
pO2, Arterial: 189 mmHg — ABNORMAL HIGH (ref 80.0–100.0)

## 2015-01-22 LAB — TROPONIN I: TROPONIN I: 5.81 ng/mL — AB (ref <0.031)

## 2015-01-22 LAB — GLUCOSE, CAPILLARY
GLUCOSE-CAPILLARY: 128 mg/dL — AB (ref 65–99)
GLUCOSE-CAPILLARY: 142 mg/dL — AB (ref 65–99)
GLUCOSE-CAPILLARY: 170 mg/dL — AB (ref 65–99)
GLUCOSE-CAPILLARY: 176 mg/dL — AB (ref 65–99)
GLUCOSE-CAPILLARY: 177 mg/dL — AB (ref 65–99)
GLUCOSE-CAPILLARY: 188 mg/dL — AB (ref 65–99)
GLUCOSE-CAPILLARY: 236 mg/dL — AB (ref 65–99)
Glucose-Capillary: 151 mg/dL — ABNORMAL HIGH (ref 65–99)
Glucose-Capillary: 153 mg/dL — ABNORMAL HIGH (ref 65–99)
Glucose-Capillary: 160 mg/dL — ABNORMAL HIGH (ref 65–99)
Glucose-Capillary: 160 mg/dL — ABNORMAL HIGH (ref 65–99)
Glucose-Capillary: 137 mg/dL — ABNORMAL HIGH (ref 65–99)
Glucose-Capillary: 193 mg/dL — ABNORMAL HIGH (ref 65–99)

## 2015-01-22 LAB — CBC
HEMATOCRIT: 21.4 % — AB (ref 39.0–52.0)
HEMOGLOBIN: 7.4 g/dL — AB (ref 13.0–17.0)
MCH: 30.2 pg (ref 26.0–34.0)
MCHC: 34.6 g/dL (ref 30.0–36.0)
MCV: 87.3 fL (ref 78.0–100.0)
Platelets: 146 10*3/uL — ABNORMAL LOW (ref 150–400)
RBC: 2.45 MIL/uL — AB (ref 4.22–5.81)
RDW: 14.3 % (ref 11.5–15.5)
WBC: 43 10*3/uL — AB (ref 4.0–10.5)

## 2015-01-22 LAB — BASIC METABOLIC PANEL
Anion gap: 7 (ref 5–15)
BUN: 71 mg/dL — AB (ref 6–20)
CO2: 18 mmol/L — ABNORMAL LOW (ref 22–32)
CREATININE: 1.92 mg/dL — AB (ref 0.61–1.24)
Calcium: 6.1 mg/dL — CL (ref 8.9–10.3)
Chloride: 117 mmol/L — ABNORMAL HIGH (ref 101–111)
GFR calc non Af Amer: 33 mL/min — ABNORMAL LOW (ref >60)
GFR, EST AFRICAN AMERICAN: 39 mL/min — AB (ref >60)
GLUCOSE: 165 mg/dL — AB (ref 65–99)
Potassium: 4.4 mmol/L (ref 3.5–5.1)
Sodium: 142 mmol/L (ref 135–145)

## 2015-01-22 LAB — MAGNESIUM: Magnesium: 1.7 mg/dL (ref 1.7–2.4)

## 2015-01-22 LAB — PHOSPHORUS: PHOSPHORUS: 3 mg/dL (ref 2.5–4.6)

## 2015-01-22 LAB — PREPARE RBC (CROSSMATCH)

## 2015-01-22 MED ORDER — SODIUM CHLORIDE 0.45 % IV SOLN
INTRAVENOUS | Status: DC
  Administered 2015-01-22: 21:00:00 via INTRAVENOUS

## 2015-01-22 MED ORDER — MIDAZOLAM HCL 2 MG/2ML IJ SOLN
2.0000 mg | Freq: Once | INTRAMUSCULAR | Status: AC
  Administered 2015-01-22: 2 mg via INTRAVENOUS

## 2015-01-22 MED ORDER — MIDAZOLAM HCL 2 MG/2ML IJ SOLN
INTRAMUSCULAR | Status: AC
  Filled 2015-01-22: qty 2

## 2015-01-22 MED ORDER — ACETAMINOPHEN 325 MG PO TABS: 650.0000 mg | ORAL_TABLET | ORAL | Status: DC | PRN

## 2015-01-22 MED ORDER — INSULIN GLARGINE 100 UNIT/ML ~~LOC~~ SOLN
15.0000 [IU] | Freq: Every day | SUBCUTANEOUS | Status: DC
  Administered 2015-01-22: 5 [IU] via SUBCUTANEOUS
  Administered 2015-01-23: 15 [IU] via SUBCUTANEOUS
  Filled 2015-01-22 (×2): qty 0.15

## 2015-01-22 MED ORDER — ACETAMINOPHEN 650 MG RE SUPP
650.0000 mg | RECTAL | Status: DC | PRN
  Administered 2015-01-22: 650 mg via RECTAL
  Filled 2015-01-22: qty 1

## 2015-01-22 MED ORDER — INSULIN GLARGINE 100 UNIT/ML ~~LOC~~ SOLN
10.0000 [IU] | SUBCUTANEOUS | Status: DC
  Administered 2015-01-22: 10 [IU] via SUBCUTANEOUS
  Filled 2015-01-22: qty 0.1

## 2015-01-22 MED ORDER — INSULIN ASPART 100 UNIT/ML ~~LOC~~ SOLN
0.0000 [IU] | SUBCUTANEOUS | Status: DC
  Administered 2015-01-22: 5 [IU] via SUBCUTANEOUS
  Administered 2015-01-22: 3 [IU] via SUBCUTANEOUS
  Administered 2015-01-22: 5 [IU] via SUBCUTANEOUS
  Administered 2015-01-23: 2 [IU] via SUBCUTANEOUS
  Administered 2015-01-23: 5 [IU] via SUBCUTANEOUS
  Administered 2015-01-23: 2 [IU] via SUBCUTANEOUS
  Administered 2015-01-23: 8 [IU] via SUBCUTANEOUS
  Administered 2015-01-23 – 2015-01-24 (×3): 3 [IU] via SUBCUTANEOUS
  Administered 2015-01-24: 5 [IU] via SUBCUTANEOUS
  Administered 2015-01-24 – 2015-01-25 (×6): 3 [IU] via SUBCUTANEOUS
  Administered 2015-01-25: 5 [IU] via SUBCUTANEOUS
  Administered 2015-01-25: 3 [IU] via SUBCUTANEOUS
  Administered 2015-01-25: 8 [IU] via SUBCUTANEOUS
  Administered 2015-01-25 – 2015-01-26 (×3): 5 [IU] via SUBCUTANEOUS
  Administered 2015-01-26: 3 [IU] via SUBCUTANEOUS

## 2015-01-22 MED ORDER — DEXTROSE-NACL 5-0.45 % IV SOLN
INTRAVENOUS | Status: DC
  Administered 2015-01-22: 11:00:00 via INTRAVENOUS

## 2015-01-22 MED ORDER — PANTOPRAZOLE SODIUM 40 MG IV SOLR
40.0000 mg | Freq: Two times a day (BID) | INTRAVENOUS | Status: DC
  Administered 2015-01-24 – 2015-01-30 (×13): 40 mg via INTRAVENOUS
  Filled 2015-01-22 (×16): qty 40

## 2015-01-22 MED ORDER — SODIUM CHLORIDE 0.9 % IV SOLN
Freq: Once | INTRAVENOUS | Status: AC
Start: 2015-01-22 — End: 2015-01-22
  Administered 2015-01-22: 18:00:00 via INTRAVENOUS

## 2015-01-22 MED ORDER — INSULIN ASPART 100 UNIT/ML ~~LOC~~ SOLN: 2.0000 [IU] | SUBCUTANEOUS | Status: DC

## 2015-01-22 NOTE — Progress Notes (Signed)
Subjective: Pt awake and alert, intubated, nodes head and blinks in response to questions. Denies chest pain and abdominal pain. Was transfused with 2 units of blood yesterday.   Objective: Vital signs in last 24 hours: Filed Vitals:   01/22/15 0349 01/22/15 0400 01/22/15 0500 01/22/15 0600  BP:  122/47 136/47 113/53  Pulse:  93 92 90  Temp:  98.6 F (37 C)    TempSrc:  Oral    Resp:  17 18 17   Height:      Weight: 191 lb 12.8 oz (87 kg)     SpO2:  100% 100% 100%   Weight change:   Intake/Output Summary (Last 24 hours) at 01/22/15 0802 Last data filed at 01/22/15 0600  Gross per 24 hour  Intake 2924.36 ml  Output   2345 ml  Net 579.36 ml   General appearance: alert and no distress, intubated - responds to verbal commands Head: Normocephalic, without obvious abnormality, atraumatic Lungs: On vent, no added sounds appreciated.  Heart: Heard in apical region S1, S2.  Abdomen: Slightly firm, not tender, no masses, no organomegaly Extremities: extremities normal, atraumatic, no cyanosis or edema, but Dp pulses hard to appreciate  Tele- Some irregular rhythms noted, but P waves present, likely sinus arrhythmia. NO Afib or VF/VT  Lab Results: Basic Metabolic Panel:  Recent Labs Lab 01/21/15 0005 01/21/15 0930 01/22/15 0449  NA 138 139 142  K 3.2* 3.9 4.4  CL 106 108 117*  CO2 24 21* 18*  GLUCOSE 303* 276* 165*  BUN 60* 72* 71*  CREATININE 1.71* 2.28* 1.92*  CALCIUM 6.4* 6.1* 6.1*  MG 1.8  --  1.7  PHOS 3.3  --  3.0   Liver Function Tests:  Recent Labs Lab 01/21/15 0005 01/21/15 0930  AST 30 34  ALT 32 28  ALKPHOS 55 50  BILITOT 1.1 1.2  PROT 4.2* 3.6*  ALBUMIN 1.6* 1.3*    Recent Labs Lab 01/15/15 1130 01/18/15 0237 01/21/15 0930  LIPASE  --  35 22  AMYLASE 978* 121*  --    CBC:  Recent Labs Lab 01/19/15 0459  01/21/15 0930 01/22/15 0449  WBC 16.0*  < > 33.6* 43.0*  NEUTROABS 13.8*  --  29.2*  --   HGB 14.9  < > 6.8* 7.4*  HCT 43.2  <  > 19.7* 21.4*  MCV 89.1  < > 89.5 87.3  PLT 151  < > 143* 146*  < > = values in this interval not displayed. Cardiac Enzymes:  Recent Labs Lab 01/21/15 0930 01/21/15 1840 01/21/15 2354  TROPONINI 0.86* 3.70* 5.81*   CBG:  Recent Labs Lab 01/22/15 0158 01/22/15 0303 01/22/15 0407 01/22/15 0509 01/22/15 0613 01/22/15 0656  GLUCAP 153* 160* 160* 137* 128* 170*   Fasting Lipid Panel:  Recent Labs Lab 01/19/15 0459  TRIG 195*   Coagulation:  Recent Labs Lab 01/20/15 2220 01/21/15 0930  LABPROT 15.5* 18.5*  INR 1.22 1.52*   Micro Results: Recent Results (from the past 240 hour(s))  MRSA PCR Screening     Status: None   Collection Time: 01/13/15  6:33 PM  Result Value Ref Range Status   MRSA by PCR NEGATIVE NEGATIVE Final    Comment:        The GeneXpert MRSA Assay (FDA approved for NASAL specimens only), is one component of a comprehensive MRSA colonization surveillance program. It is not intended to diagnose MRSA infection nor to guide or monitor treatment for MRSA infections.   Clostridium  Difficile by PCR     Status: None   Collection Time: 01/19/15  3:12 PM  Result Value Ref Range Status   C difficile by pcr NEGATIVE NEGATIVE Final  Culture, blood (routine x 2)     Status: None (Preliminary result)   Collection Time: 01/21/15  9:30 AM  Result Value Ref Range Status   Specimen Description BLOOD RIGHT ANTECUBITAL  Final   Special Requests Immunocompromised BAA 10CC EACH  Final   Culture   Final           BLOOD CULTURE RECEIVED NO GROWTH TO DATE CULTURE WILL BE HELD FOR 5 DAYS BEFORE ISSUING A FINAL NEGATIVE REPORT Performed at [facility]    Report Status PENDING  Incomplete  Culture, blood (routine x 2)     Status: None (Preliminary result)   Collection Time: 01/21/15  9:40 AM  Result Value Ref Range Status   Specimen Description BLOOD RIGHT ARM  Final   Special Requests Immunocompromised AEB 5CC  Final   Culture   Final            BLOOD CULTURE RECEIVED NO GROWTH TO DATE CULTURE WILL BE HELD FOR 5 DAYS BEFORE ISSUING A FINAL NEGATIVE REPORT Performed at [facility]    Report Status PENDING  Incomplete   Studies/Results: Dg Chest Port 1 View  01/22/2015   CLINICAL DATA:  Shortness of breath.  Intubation.  EXAM: PORTABLE CHEST - 1 VIEW  COMPARISON:  01/21/2015.  FINDINGS: Endotracheal tube 2.1 cm above the carina. Left IJ line in stable position. Interim removal of NG tube. Stable cardiomegaly with bilateral pulmonary alveolar infiltrates and pleural effusions consistent with congestive heart failure. No pneumothorax.  IMPRESSION: 1. Endotracheal tube 2.1 cm above the carina. Left IJ line in stable position Interim removal of NG tube. 2. Persistent changes of congestive heart failure with bilateral pulmonary edema and pleural effusions.   Electronically Signed   By: Maisie Fus  Register   On: 01/22/2015 07:21   Dg Chest Port 1 View  01/21/2015   CLINICAL DATA:  ET tube placement.  EXAM: PORTABLE CHEST - 1 VIEW  COMPARISON:  01/21/2015  FINDINGS: Endotracheal tube is slightly above the carina, approximately 15 mm, directed toward the right mainstem bronchus. Left central line tip is in the SVC. No pneumothorax. Bilateral lower lobe airspace opacities. Heart is normal size. Possible small layering effusions. NG tube enters the stomach. No acute bony abnormality.  IMPRESSION: Endotracheal tube approximately 15 mm above the carina, directed toward the right mainstem bronchus. This could be retracted 1-2 cm for optimal positioning.  Bibasilar atelectasis or infiltrates. Question small layering effusions.   Electronically Signed   By: Charlett Nose M.D.   On: 01/21/2015 11:57   Dg Chest Port 1 View  01/21/2015   CLINICAL DATA:  Lethargy, shortness of breath, epigastric pain  EXAM: PORTABLE CHEST - 1 VIEW  COMPARISON:  01/19/2015  FINDINGS: Cardiomediastinal silhouette is stable. Persistent bilateral small pleural effusion with  bilateral basilar atelectasis or infiltrate. No convincing pulmonary edema.  IMPRESSION: Persistent bilateral small pleural effusion with bilateral basilar atelectasis or infiltrate. No pulmonary edema.   Electronically Signed   By: Natasha Mead M.D.   On: 01/21/2015 08:57   Medications: I have reviewed the patient's current medications. Scheduled Meds: . antiseptic oral rinse  7 mL Mouth Rinse q12n4p  . chlorhexidine  15 mL Mouth Rinse BID  . imipenem-cilastatin  250 mg Intravenous 4 times per day  . insulin  aspart  2-6 Units Subcutaneous 6 times per day  . insulin glargine  10 Units Subcutaneous Q24H  . latanoprost  1 drop Both Eyes QHS  . vancomycin  1,250 mg Intravenous Q24H   Continuous Infusions: . sodium chloride 10 mL/hr at 01/21/15 1900  . fentaNYL infusion INTRAVENOUS 150 mcg/hr (01/22/15 0056)  . insulin (NOVOLIN-R) infusion 0.5 Units/hr (01/22/15 0800)  . norepinephrine (LEVOPHED) Adult infusion 15 mcg/min (01/22/15 0200)  . pantoprozole (PROTONIX) infusion 8 mg/hr (01/21/15 2300)  . 0.9 % sodium chloride with kcl 50 mL/hr at 01/21/15 1900   PRN Meds:.acetaminophen, fentaNYL, levalbuterol, metoCLOPramide (REGLAN) injection, midazolam, midazolam, ondansetron (ZOFRAN) IV Assessment/Plan: Principal Problem:   Acute gallstone pancreatitis Active Problems:   Abdominal pain, epigastric   Nausea with vomiting   Elevated LFTs   Gallstones   Syncope   Hypotension   Bradycardia   AKI (acute kidney injury)   Diabetes   Pancreatitis   NSTEMI (non-ST elevated myocardial infarction)   Cardiomyopathy   Choledocholithiasis   Hypocalcemia   Ileus   Acute biliary pancreatitis   Leukocytosis   Syncope and collapse   Acute systolic congestive heart failure   Essential hypertension   Other specified hypotension   Acute kidney injury   Diabetes type 2, uncontrolled   CAD, multiple vessel: CTO of RCA, Cx with severe prox LAD disease   Calculus of gallbladder with acute  cholecystitis without obstruction   Cholecystitis, acute   Metabolic acidosis   Acute systolic CHF (congestive heart failure)   Acute renal failure syndrome   Acute blood loss anemia   Hemorrhagic shock   Acute respiratory failure with hypoxemia   Septic shock   Ulcerative esophagitis   Hematemesis with nausea   Melena   Demand ischemia of myocardium  Hemorrhagic Shock/Acute Blood Loss Anemia- Hgb- 7.4 this am. As low as 6.8 yesterday. Status post 2units of PRBC, presently on pressors and PPi infusion. EGD done yesterday- results- Erosive  esophagitis . Concern for Upper GI blood loss with melanotic stools. Should receive additional 2 units of blood today.  CAD- Milti-vessel dx on Cath- 01/17/2015, planned for CABG when acute issues resolve. New elevated troponin - 5.81 yesterday after trending down to 0.86, likely due to demand ischemia from Acute Anemia. BB- Coreg d/cd- due to acute blood loss and hypotension.  - Hold Coreg, & afterload reduction while on pressors.  - Hold antiplatelet for now. - Will need Diuresis once stable and acute issues resolve.  - Will check co-ox to estimate CO. - Goal Hgb> 9. - Conservative management for now, considering significant Co-morbities. With GI Bld (salvage PCI of LAD is not a valid option_ - best option is hopefully he will survive current GI complications & be able to go for CABG.  CARDIOMYOPATHY: EF 30 - 40%.Beta blocker and hydralazine/nitrates D/c'd.  - Will need diuresis later - probably could use PO. - Will check Co-ox  Acute Pancreatitis with Acute calculous cholecystitis- With elevated LFTs. Appeared to be resolving, but with new elevated WBC as high as 43, with stool negative C.diff. Concern for necrotic pancreas and Ischemic Bowel. On vanc and primaxin. - Per primary and GI - Long term plan is going to be of concern -- he needs CABG, but would not be a candidate until GI issues are resolved.  AKI- Improving. 1.92 today (but up from  yesterday - not unexpectedly - likely ATN). Highest 2.28- 01/21/2015- likely due to acute hypotension from GI blood loss. Making urine- out- 2.9L yesterday.  Hypokalemia and hypocalcemia- K- 4.4 today. Ca- Corrected - 8.3, ionized Ca in process. - with ischemic CM - would repleat if possible.  Diabetes- On Lantus and SSI. Per primary.  Significant leukocytosis- WBC- 43 this am, up form 17.9- 01/20/2015. Blood cultures 01/21/2015 pending. Temp- 100.3 this am. Concern for necrotic pancreas and Ischemic Bowel. On Vanc and primaxin  Erosive Esophagitis- On PPI infusion. NPO & NGTout -- ? How long without enteral feeding, ? May need to consider parenteral supplementation.   LOS: 9 days   Onnie Boer, MD 01/22/2015, 8:02 AM   I have seen, examined and evaluated the patient this AM along with Dr. Mariea Clonts on rounds.  After reviewing all the available data and chart,  I agree with her findings, examination as well as impression recommendations - with my adjustments in italics.  Main concern for now is supportive care through GI issues - transfuse to get Hgb >9, avoid volume overload (CVP only ~6 mmHg) unable to anticoagulate.  Weaning pressors.    Currently no sign of Cardiogenic Shock - no need for mechanical LV support.  Marykay Lex, M.D., M.S. Interventional Cardiologist   Pager # [phone]

## 2015-01-22 NOTE — Progress Notes (Signed)
PT Cancellation Note  Patient Details Name: [name] [name] MRN: [id number] DOB: 1941/06/29   Cancelled Treatment:    Reason Eval/Treat Not Completed: Medical issues which prohibited therapy. Discussed pt case with RN who asks that PT hold for today. Will continue to follow.    Conni Slipper 01/22/2015, 9:54 AM   Conni Slipper, PT, DPT Acute Rehabilitation Services Pager: [phone]

## 2015-01-22 NOTE — Progress Notes (Signed)
eLink Physician-Brief Progress Note Patient Name: Elikai Astbury DOB: [birth year] MRN: [id number]   Date of Service  01/22/2015  HPI/Events of Note  Persistent hypocalcemia  eICU Interventions  Check ionized calcium     Intervention Category Intermediate Interventions: Electrolyte abnormality - evaluation and management  DETERDING,ELIZABETH 01/22/2015, 5:32 AM

## 2015-01-22 NOTE — Progress Notes (Signed)
PULMONARY / CRITICAL CARE MEDICINE   Name: [name] [name] MRN: [id number] DOB: [birth year]    ADMISSION DATE:  01/13/2015 CONSULTATION DATE:  01/21/15  REFERRING MD :  Dr. Susie Cassette  CHIEF COMPLAINT:  Pancreatitis, Hypotension   INITIAL PRESENTATION: 74 y/o M admitted 5/8 after a syncopal episode and found to have acute pancreatitis. Also positive troponin/NSTEMI    EVENTS/STUDIES:  5/08  Transfer from [place] after syncopal episode, gallstones, pancreatitis, elevated troponin I, abnormal echocardiogram 5/09  TTE: EF 30-40%, akinesis of basal-midinferior myocardium - severe hypokinesis lateral myocardium - grade 1 DD 5/12  LHC: severe 3 vessel disease  5/13  CT ABD/Pelvis: infiltration edema around pancreas, c/w acute pancreatitis, no abscess, cholelithiasis with stones in CBD, bilateral pleural effusions with compressive atx. 5/15 transfused one unit PRBCs 5/16 Transferred to ICU with acute respiratory distress. Intubated. PCCM consultation. CXR revealed BLL consolidation 5/16 transfused one unit PRBCs 5/16 EGD: grade 4 esophagitiswith deep ulcers, eschar, friability, blood clllots in stomach, no gastric ulcers or mucosal abnormalities, mod edema of 2nd portion of duodenum c/w pancreatitis. Cont PPI infusion initiated. Heparin disconitnued. OGT left out 5/17 RASS 0. CAM-ICU negative. Received one unit PRBCs for Hgb 7.4 in setting of ACS   INDWELLING DEVICES:: L IJ CVL 5/16 >> 5/17 L radial A-line 5/`6 >>  ETT 5/16 >>  R IJ CVL 5/17 >>   MICRO DATA: MRSA PCR 5/08 >> NEG C diff 5/14 >> NEG Blood 5/16 >>  Resp 5/17 >>   ANTIMICROBIALS:  Pip-tazo 5/12 >> 5/16 Vanc 5/16 >>  Imipenem 5/16 >>     SUBJECTIVE:  RASS 0, + F//C  VITAL SIGNS: Temp:  [98.6 F (37 C)-101.8 F (38.8 C)] 99.4 F (37.4 C) (05/17 1740) Pulse Rate:  [81-101] 94 (05/17 1900) Resp:  [0-22] 18 (05/17 1900) BP: (88-136)/(33-84) 91/50 mmHg (05/17 1900) SpO2:  [98 %-100 %] 100 % (05/17 1900) Arterial Line BP:  (77-152)/(41-107) 108/51 mmHg (05/17 1900) FiO2 (%):  [30 %-50 %] 30 % (05/17 1916) Weight:  [87 kg (191 lb 12.8 oz)] 87 kg (191 lb 12.8 oz) (05/17 0349)   HEMODYNAMICS: CVP:  [5 mmHg-8 mmHg] 6 mmHg   VENTILATOR SETTINGS: Vent Mode:  [-] PRVC FiO2 (%):  [30 %-50 %] 30 % Set Rate:  [15 bmp-16 bmp] 15 bmp Vt Set:  [620 mL] 620 mL PEEP:  [5 cmH20] 5 cmH20 Plateau Pressure:  [15 cmH20-18 cmH20] 15 cmH20   INTAKE / OUTPUT:  Intake/Output Summary (Last 24 hours) at 01/22/15 2045 Last data filed at 01/22/15 1900  Gross per 24 hour  Intake 3521.27 ml  Output   2390 ml  Net 1131.27 ml    PHYSICAL EXAMINATION: General: RASS 0, + F/C Neuro: no focal deficits HEENT: WNL Cardiovascular: reg, no M Lungs: bilateral rhonchi Abdomen:  Mild distention, NT, diminished BS Ext: cool, 1+ symmetric edema  LABS:  CBC  Recent Labs Lab 01/21/15 0723 01/21/15 0930 01/22/15 0449  WBC 30.2* 33.6* 43.0*  HGB 7.6* 6.8* 7.4*  HCT 22.2* 19.7* 21.4*  PLT 143* 143* 146*   Coag's  Recent Labs Lab 01/20/15 2220 01/21/15 0930  APTT 30 28  INR 1.22 1.52*   BMET  Recent Labs Lab 01/21/15 0005 01/21/15 0930 01/22/15 0449  NA 138 139 142  K 3.2* 3.9 4.4  CL 106 108 117*  CO2 24 21* 18*  BUN 60* 72* 71*  CREATININE 1.71* 2.28* 1.92*  GLUCOSE 303* 276* 165*   Electrolytes  Recent Labs Lab 01/20/15 4098  01/21/15 0005 01/21/15 0930 01/22/15 0449  CALCIUM 6.7* 6.4* 6.1* 6.1*  MG 2.1 1.8  --  1.7  PHOS 2.4* 3.3  --  3.0   Sepsis Markers  Recent Labs Lab 01/21/15 0930 01/21/15 1745  LATICACIDVEN 4.3* 1.6  PROCALCITON 1.12  1.33  --      ABG  Recent Labs Lab 01/21/15 0854 01/21/15 1200 01/22/15 0325  PHART 7.531* 7.409 7.440  PCO2ART 21.7* 34.3* 26.2*  PO2ART 69.4* 374.0* 189*   Liver Enzymes  Recent Labs Lab 01/20/15 0338 01/21/15 0005 01/21/15 0930  AST 31 30 34  ALT 37 32 28  ALKPHOS 60 55 50  BILITOT 1.5* 1.1 1.2  ALBUMIN 1.9* 1.6* 1.3*    Cardiac Enzymes  Recent Labs Lab 01/21/15 0930 01/21/15 1840 01/21/15 2354  TROPONINI 0.86* 3.70* 5.81*   Glucose  Recent Labs Lab 01/22/15 0656 01/22/15 0759 01/22/15 0902 01/22/15 0959 01/22/15 1137 01/22/15 1551  GLUCAP 170* 176* 188* 177* 193* 236*    CXR: bibasilar opacities    ASSESSMENT / PLAN:  PULMONARY A: Acute respiratory failure Suspect component of pulm edema P:   Cont full vent support - settings reviewed and/or adjusted Cont vent bundle Daily SBT if/when meets criteria  CARDIOVASCULAR  A:  NSTEMI Severe 3 vessel CAD Ischemic cardiomyopathy Shock - cardiogenic +/- septic SVT - resolved Hx Htn P:  CVP goal 10-14. NS boluses PRN MAP goal > 65 mmHg. Titrate NE Cardiology managing  RENAL A:   CKD (baseline Cr 1.54) AKI, nonoliguric Mild NAG acidosis P:   Monitor BMET intermittently Monitor I/Os Correct electrolytes as indicated  GASTROINTESTINAL A:   Gallstone pancreatitis  Severe esophagitis Protein Calorie Malnutrition  P:   SUP: PPI infusion  No G tube due to esophagitis Per GI, OK to start oral nutrition after extubation  HEMATOLOGIC A:   Acute Blood Loss Anemia Very mild thrombocytopenia P:  DVT px: SCDs Monitor CBC intermittently Transfuse per usual ICU guidelines noting ACS Holding all anticoagulants inc ASA  INFECTIOUS A:   Severe pancreatitis Suspected HCAP P:   Micro and abx as above  ENDOCRINE A:   DM 2, now controlled P:   Transition from insulin gtt to  Lantus SSI, mod scale  NEUROLOGIC A:   ICU associated discomfort P:   RASS goal: -1 Fentanyl gtt for pain  PRN versed for sedation    FAMILY  - Updates: Wife, daughters updated at bedside 5/17  CCM time 40 mins  Billy Fischer, MD ; [facility] service Mobile [phone].  After 5:30 PM or weekends, call [phone]

## 2015-01-22 NOTE — Progress Notes (Signed)
OT Cancellation Note  Patient Details Name: [name] [name] MRN: [id number] DOB: Mar 05, 1941   Cancelled Treatment:    Reason Eval/Treat Not Completed: Medical issues which prohibited therapy (Pt intubated.)  Evern Bio 01/22/2015, 8:41 AM

## 2015-01-22 NOTE — Progress Notes (Signed)
RT obtained sputum and given to RN. Pt tol well

## 2015-01-22 NOTE — Progress Notes (Signed)
eLink Physician-Brief Progress Note Patient Name: [name] [name] DOB: 20-Aug-1941 MRN: [id number]   Date of Service  01/22/2015  HPI/Events of Note  Portable CXR s/p R IJ central venous catheter placement reveals central venous catheter tip in distal SVC. No pneumothorax.   eICU Interventions  OK to use R IJ central venous catheter for fluid and medication administration.     Intervention Category Intermediate Interventions: Diagnostic test evaluation  Sommer,Steven Dennard Nip 01/22/2015, 10:22 PM

## 2015-01-22 NOTE — Procedures (Signed)
Central Venous Catheter Insertion Procedure Note Zylin Kuhnke [id number] Mar 21, 1941  Procedure: Insertion of Central Venous Catheter Indications: Assessment of intravascular volume, Drug and/or fluid administration and Frequent blood sampling  Procedure Details Consent: Risks of procedure as well as the alternatives and risks of each were explained to the (patient/caregiver).  Consent for procedure obtained. Time Out: Verified patient identification, verified procedure, site/side was marked, verified correct patient position, special equipment/implants available, medications/allergies/relevent history reviewed, required imaging and test results available.  Performed  Maximum sterile technique was used including antiseptics, cap, gloves, gown, hand hygiene, mask and sheet. Skin prep: Chlorhexidine; local anesthetic administered A antimicrobial bonded/coated triple lumen catheter was placed in the right internal jugular vein using the Seldinger technique. Ultrasound guidance used.Yes.   Catheter placed to 20 cm. Blood aspirated via all 3 ports and then flushed x 3. Line sutured x 2 and dressing applied.  Evaluation Blood flow good Complications: No apparent complications Patient did tolerate procedure well. Chest X-ray ordered to verify placement.  CXR: pending.  Joneen Roach, AGACNP-BC [facility] Pulmonology/Critical Care Pager [phone] or [phone]  01/22/2015 8:38 PM   Billy Fischer, MD ; [facility] service Mobile [phone].  After 5:30 PM or weekends, call [phone]

## 2015-01-22 NOTE — Progress Notes (Signed)
Daily Rounding Note  01/22/2015, 8:26 AM  LOS: 9 days   SUBJECTIVE:       Remains intubated.  Levophed in place.    OBJECTIVE:         Vital signs in last 24 hours:    Temp:  [98.6 F (37 C)-100.3 F (37.9 C)] 100.3 F (37.9 C) (05/17 0800) Pulse Rate:  [25-120] 94 (05/17 0800) Resp:  [0-31] 16 (05/17 0800) BP: (73-155)/(27-100) 119/53 mmHg (05/17 0800) SpO2:  [91 %-100 %] 100 % (05/17 0800) Arterial Line BP: (49-152)/(40-113) 149/61 mmHg (05/17 0800) FiO2 (%):  [50 %-100 %] 50 % (05/17 0400) Weight:  [191 lb 12.8 oz (87 kg)] 191 lb 12.8 oz (87 kg) (05/17 0349) Last BM Date: 01/20/15 Filed Weights   01/19/15 1115 01/20/15 0344 01/22/15 0349  Weight: 179 lb 3.7 oz (81.3 kg) 177 lb 7.5 oz (80.5 kg) 191 lb 12.8 oz (87 kg)   General: comfortable, relaxed on vent.  Multiple IV drips in place.    Heart: RRR, sinus rhythm on tele.  Chest: clear.  No labored resps on vent Abdomen: soft, NT, ND, scant BS  Extremities: no CCE Neuro/Psych:  Alert, follows commands.  Not sedated. amazingly relaxed given   Intake/Output from previous day: 05/16 0701 - 05/17 0700 In: 2924.4 [I.V.:2139.4; Blood:335; IV Piggyback:450] Out: 2895 [Urine:2895]  Intake/Output this shift: Total I/O In: 228.2 [I.V.:228.2] Out: 40 [Urine:40]  Lab Results:  Recent Labs  01/21/15 0723 01/21/15 0930 01/22/15 0449  WBC 30.2* 33.6* 43.0*  HGB 7.6* 6.8* 7.4*  HCT 22.2* 19.7* 21.4*  PLT 143* 143* 146*   BMET  Recent Labs  01/21/15 0005 01/21/15 0930 01/22/15 0449  NA 138 139 142  K 3.2* 3.9 4.4  CL 106 108 117*  CO2 24 21* 18*  GLUCOSE 303* 276* 165*  BUN 60* 72* 71*  CREATININE 1.71* 2.28* 1.92*  CALCIUM 6.4* 6.1* 6.1*   LFT  Recent Labs  01/20/15 0338 01/21/15 0005 01/21/15 0930  PROT 4.9* 4.2* 3.6*  ALBUMIN 1.9* 1.6* 1.3*  AST 31 30 34  ALT 37 32 28  ALKPHOS 60 55 50  BILITOT 1.5* 1.1 1.2   PT/INR  Recent Labs  01/20/15 2220 01/21/15 0930  LABPROT 15.5* 18.5*  INR 1.22 1.52*   Hepatitis Panel No results for input(s): HEPBSAG, HCVAB, HEPAIGM, HEPBIGM in the last 72 hours.  Studies/Results: Dg Chest Port 1 View  01/22/2015   CLINICAL DATA:  Shortness of breath.  Intubation.  EXAM: PORTABLE CHEST - 1 VIEW  COMPARISON:  01/21/2015.  FINDINGS: Endotracheal tube 2.1 cm above the carina. Left IJ line in stable position. Interim removal of NG tube. Stable cardiomegaly with bilateral pulmonary alveolar infiltrates and pleural effusions consistent with congestive heart failure. No pneumothorax.  IMPRESSION: 1. Endotracheal tube 2.1 cm above the carina. Left IJ line in stable position Interim removal of NG tube. 2. Persistent changes of congestive heart failure with bilateral pulmonary edema and pleural effusions.   Electronically Signed   By: Maisie Fus  Register   On: 01/22/2015 07:21   Dg Chest Port 1 View  01/21/2015   CLINICAL DATA:  ET tube placement.  EXAM: PORTABLE CHEST - 1 VIEW  COMPARISON:  01/21/2015  FINDINGS: Endotracheal tube is slightly above the carina, approximately 15 mm, directed toward the right mainstem bronchus. Left central line tip is in the SVC. No pneumothorax. Bilateral lower lobe airspace opacities. Heart is normal size. Possible small layering  effusions. NG tube enters the stomach. No acute bony abnormality.  IMPRESSION: Endotracheal tube approximately 15 mm above the carina, directed toward the right mainstem bronchus. This could be retracted 1-2 cm for optimal positioning.  Bibasilar atelectasis or infiltrates. Question small layering effusions.   Electronically Signed   By: Charlett Nose M.D.   On: 01/21/2015 11:57   Dg Chest Port 1 View  01/21/2015   CLINICAL DATA:  Lethargy, shortness of breath, epigastric pain  EXAM: PORTABLE CHEST - 1 VIEW  COMPARISON:  01/19/2015  FINDINGS: Cardiomediastinal silhouette is stable. Persistent bilateral small pleural effusion with bilateral basilar  atelectasis or infiltrate. No convincing pulmonary edema.  IMPRESSION: Persistent bilateral small pleural effusion with bilateral basilar atelectasis or infiltrate. No pulmonary edema.   Electronically Signed   By: Natasha Mead M.D.   On: 01/21/2015 08:57   Scheduled Meds: . antiseptic oral rinse  7 mL Mouth Rinse q12n4p  . chlorhexidine  15 mL Mouth Rinse BID  . imipenem-cilastatin  250 mg Intravenous 4 times per day  . insulin aspart  2-6 Units Subcutaneous 6 times per day  . insulin glargine  10 Units Subcutaneous Q24H  . latanoprost  1 drop Both Eyes QHS  . vancomycin  1,250 mg Intravenous Q24H   Continuous Infusions: . sodium chloride 10 mL/hr at 01/22/15 0800  . fentaNYL infusion INTRAVENOUS 150 mcg/hr (01/22/15 0800)  . insulin (NOVOLIN-R) infusion 0.5 Units/hr (01/22/15 0800)  . norepinephrine (LEVOPHED) Adult infusion 13 mcg/min (01/22/15 0800)  . pantoprozole (PROTONIX) infusion 8 mg/hr (01/22/15 0800)  . 0.9 % sodium chloride with kcl 50 mL/hr at 01/22/15 0800   PRN Meds:.acetaminophen, fentaNYL, levalbuterol, metoCLOPramide (REGLAN) injection, midazolam, midazolam, ondansetron (ZOFRAN) IV   ASSESMENT:   *  Hematemesis, melena.  EGD 5/16: severe, ulcerative esophagitis.  Blood clots in stomach. D2 edema, c/w pancreatitis effect. Continuous IV PPI drip in place.   *  Biliary pancreatitis.  MRCP not convincing for CBD stone ("potential 4 mm filling defect") CT 5/13 with CBD stone but not obstructing/ no ductal dilatation.  Lipase and LFTshave all normalized.   *  Rising WBC count.  Blood clx of 5/16 in process. CHF, bil pulm edema and bil pleural effusions on CXR.  Last CT abdomen 5/13.   *  Prolonged coags.   *  Hypocalcemia, persistent since admission 5/8. Ionized calcium in process.   *  AKI.  Improved.   *  Anemia in setting of GIB and acute, multi-organ illness.   *  SB ileus.   *  3 vsl CAD.  CABG    *  Resp insufficiency.  ETT placed 5/16  *  Type  2 DM.   No insulin at home.  Now on insulin Gtt     PLAN   *  Continue supportive care.  *  When extubated and can resume po meds.     Jennye Moccasin  01/22/2015, 8:26 AM Pager: [phone]  GI ATTENDING  Interval history and data reviewed. Patient personally seen and examined. Multiple family members in room with questions. Agree with interval progress note as outlined above.  IMPRESSION 1. Acute upper GI bleed secondary to acid reflux-related erosive esophagitis in the face of anticoagulation therapy. Stable without significant rebleeding off anticoagulation and on IV PPI drip. Expect resolution with time 2. Gallstone pancreatitis. Mild. CT without worrisome changes. Bile duct not dilated. Liver tests and pancreas enzymes have normalized. 3. Three-vessel coronary artery disease needing coronary artery bypass grafting  RECOMMENDATIONS 1.  Continue IV PPI drip continuously until taking by mouth's reliably. At that point, convert to pantoprazole 40 mg twice a day 2. Transfuse as needed 3. Will need laparoscopic cholecystectomy with intraoperative cholangiogram. Timing to be determined by general surgery 4. Will need coronary artery bypass grafting. Timing to be determined by cardiology and thoracic surgery  John N. Eda Keys., M.D. [facility] Division of Gastroenterology

## 2015-01-22 NOTE — Progress Notes (Signed)
Upon pulling pt up in bed, pt's CVL got pulled out.  2 new PIVs inserted and gtts moved to those lines.  MD aware and at bedside to assess.

## 2015-01-23 ENCOUNTER — Inpatient Hospital Stay (HOSPITAL_COMMUNITY): Payer: Medicare Other

## 2015-01-23 DIAGNOSIS — K208 Other esophagitis: Secondary | ICD-10-CM

## 2015-01-23 DIAGNOSIS — I10 Essential (primary) hypertension: Secondary | ICD-10-CM

## 2015-01-23 LAB — COMPREHENSIVE METABOLIC PANEL
ALK PHOS: 66 U/L (ref 38–126)
ALT: 63 U/L (ref 17–63)
AST: 94 U/L — ABNORMAL HIGH (ref 15–41)
Albumin: 1.3 g/dL — ABNORMAL LOW (ref 3.5–5.0)
Anion gap: 5 (ref 5–15)
BUN: 60 mg/dL — ABNORMAL HIGH (ref 6–20)
CHLORIDE: 120 mmol/L — AB (ref 101–111)
CO2: 19 mmol/L — ABNORMAL LOW (ref 22–32)
CREATININE: 1.89 mg/dL — AB (ref 0.61–1.24)
Calcium: 6.9 mg/dL — ABNORMAL LOW (ref 8.9–10.3)
GFR calc non Af Amer: 34 mL/min — ABNORMAL LOW (ref >60)
GFR, EST AFRICAN AMERICAN: 39 mL/min — AB (ref >60)
GLUCOSE: 175 mg/dL — AB (ref 65–99)
Potassium: 4.3 mmol/L (ref 3.5–5.1)
Sodium: 144 mmol/L (ref 135–145)
Total Bilirubin: 1.1 mg/dL (ref 0.3–1.2)
Total Protein: 4.3 g/dL — ABNORMAL LOW (ref 6.5–8.1)

## 2015-01-23 LAB — CBC
HCT: 22 % — ABNORMAL LOW (ref 39.0–52.0)
Hemoglobin: 7.4 g/dL — ABNORMAL LOW (ref 13.0–17.0)
MCH: 29.8 pg (ref 26.0–34.0)
MCHC: 33.6 g/dL (ref 30.0–36.0)
MCV: 88.7 fL (ref 78.0–100.0)
Platelets: 134 10*3/uL — ABNORMAL LOW (ref 150–400)
RBC: 2.48 MIL/uL — ABNORMAL LOW (ref 4.22–5.81)
RDW: 14.8 % (ref 11.5–15.5)
WBC: 25.2 10*3/uL — AB (ref 4.0–10.5)

## 2015-01-23 LAB — BASIC METABOLIC PANEL
Anion gap: 6 (ref 5–15)
BUN: 47 mg/dL — AB (ref 6–20)
CO2: 20 mmol/L — ABNORMAL LOW (ref 22–32)
Calcium: 6.3 mg/dL — CL (ref 8.9–10.3)
Chloride: 118 mmol/L — ABNORMAL HIGH (ref 101–111)
Creatinine, Ser: 1.89 mg/dL — ABNORMAL HIGH (ref 0.61–1.24)
GFR calc Af Amer: 39 mL/min — ABNORMAL LOW (ref >60)
GFR calc non Af Amer: 34 mL/min — ABNORMAL LOW (ref >60)
GLUCOSE: 282 mg/dL — AB (ref 65–99)
POTASSIUM: 3.8 mmol/L (ref 3.5–5.1)
Sodium: 144 mmol/L (ref 135–145)

## 2015-01-23 LAB — APTT: APTT: 32 s (ref 24–37)

## 2015-01-23 LAB — CARBOXYHEMOGLOBIN
Carboxyhemoglobin: 1.4 % (ref 0.5–1.5)
Methemoglobin: 1.1 % (ref 0.0–1.5)
O2 SAT: 58.4 %
TOTAL HEMOGLOBIN: 7.4 g/dL — AB (ref 13.5–18.0)

## 2015-01-23 LAB — GLUCOSE, CAPILLARY
GLUCOSE-CAPILLARY: 189 mg/dL — AB (ref 65–99)
GLUCOSE-CAPILLARY: 220 mg/dL — AB (ref 65–99)
Glucose-Capillary: 171 mg/dL — ABNORMAL HIGH (ref 65–99)
Glucose-Capillary: 128 mg/dL — ABNORMAL HIGH (ref 65–99)
Glucose-Capillary: 138 mg/dL — ABNORMAL HIGH (ref 65–99)
Glucose-Capillary: 213 mg/dL — ABNORMAL HIGH (ref 65–99)
Glucose-Capillary: 182 mg/dL — ABNORMAL HIGH (ref 65–99)

## 2015-01-23 LAB — PREPARE RBC (CROSSMATCH)

## 2015-01-23 LAB — CALCIUM, IONIZED: Calcium, Ionized, Serum: 3.9 mg/dL — ABNORMAL LOW (ref 4.5–5.6)

## 2015-01-23 LAB — PROTIME-INR
INR: 1.43 (ref 0.00–1.49)
Prothrombin Time: 17.6 seconds — ABNORMAL HIGH (ref 11.6–15.2)

## 2015-01-23 LAB — PROCALCITONIN: Procalcitonin: 3.01 ng/mL

## 2015-01-23 MED ORDER — BOOST / RESOURCE BREEZE PO LIQD
1.0000 | Freq: Three times a day (TID) | ORAL | Status: DC
  Administered 2015-01-23 – 2015-01-28 (×9): 1 via ORAL

## 2015-01-23 MED ORDER — DEXTROSE 5 % IV SOLN
INTRAVENOUS | Status: DC
  Administered 2015-01-23 – 2015-01-24 (×2): via INTRAVENOUS

## 2015-01-23 MED ORDER — ISOSORBIDE MONONITRATE ER 30 MG PO TB24
30.0000 mg | ORAL_TABLET | Freq: Every day | ORAL | Status: DC
  Administered 2015-01-23: 30 mg via ORAL
  Filled 2015-01-23 (×2): qty 1

## 2015-01-23 MED ORDER — SODIUM CHLORIDE 0.9 % IV SOLN
1.0000 g | Freq: Once | INTRAVENOUS | Status: AC
  Administered 2015-01-23: 1 g via INTRAVENOUS
  Filled 2015-01-23: qty 10

## 2015-01-23 MED ORDER — FUROSEMIDE 10 MG/ML IJ SOLN
40.0000 mg | Freq: Four times a day (QID) | INTRAMUSCULAR | Status: AC
  Administered 2015-01-23 (×2): 40 mg via INTRAVENOUS
  Filled 2015-01-23 (×2): qty 4

## 2015-01-23 MED ORDER — BISACODYL 10 MG RE SUPP
10.0000 mg | Freq: Once | RECTAL | Status: AC
  Administered 2015-01-23: 10 mg via RECTAL
  Filled 2015-01-23: qty 1

## 2015-01-23 MED ORDER — METOCLOPRAMIDE HCL 5 MG/ML IJ SOLN
5.0000 mg | Freq: Four times a day (QID) | INTRAMUSCULAR | Status: DC
  Administered 2015-01-23 – 2015-01-24 (×4): 5 mg via INTRAVENOUS
  Filled 2015-01-23 (×8): qty 1

## 2015-01-23 MED ORDER — SODIUM CHLORIDE 0.9 % IV SOLN: Freq: Once | INTRAVENOUS | Status: DC

## 2015-01-23 MED ORDER — FENTANYL CITRATE (PF) 100 MCG/2ML IJ SOLN
12.5000 ug | INTRAMUSCULAR | Status: DC | PRN
  Administered 2015-02-14: 250 ug via INTRAVENOUS
  Administered 2015-02-14: 50 ug via INTRAVENOUS
  Administered 2015-02-14 (×2): 250 ug via INTRAVENOUS
  Administered 2015-02-14 (×2): 150 ug via INTRAVENOUS
  Administered 2015-02-14: 200 ug via INTRAVENOUS
  Administered 2015-02-14: 150 ug via INTRAVENOUS
  Administered 2015-02-14: 50 ug via INTRAVENOUS

## 2015-01-23 MED ORDER — BISACODYL 10 MG RE SUPP: 10.0000 mg | Freq: Every day | RECTAL | Status: DC | PRN

## 2015-01-23 NOTE — Progress Notes (Signed)
eLink Physician-Brief Progress Note Patient Name: [name] [name] DOB: [birth year] MRN: [id number]   Date of Service  01/23/2015  HPI/Events of Note  Having difficulty swallowing with dinner.   eICU Interventions  Will order: 1. NPO. 2. Speech Consultation for swallowing study in AM.     Intervention Category Minor Interventions: Routine modifications to care plan (e.g. PRN medications for pain, fever)  Sommer,Steven Eugene 01/23/2015, 6:43 PM

## 2015-01-23 NOTE — Progress Notes (Signed)
Daily Rounding Note  01/23/2015, 8:06 AM  LOS: 10 days   SUBJECTIVE:       Denies pain.  Keeps answering "water" to all questions. Extubated.  No BMs, so rectal suppository ordered.   OBJECTIVE:         Vital signs in last 24 hours:    Temp:  [97.9 F (36.6 C)-101.8 F (38.8 C)] 97.9 F (36.6 C) (05/18 0800) Pulse Rate:  [86-101] 92 (05/18 0800) Resp:  [10-25] 17 (05/18 0800) BP: (85-131)/(41-56) 91/41 mmHg (05/18 0100) SpO2:  [91 %-100 %] 100 % (05/18 0800) Arterial Line BP: (92-147)/(42-107) 137/59 mmHg (05/18 0800) FiO2 (%):  [30 %] 30 % (05/18 0800) Weight:  [197 lb 12 oz (89.7 kg)] 197 lb 12 oz (89.7 kg) (05/18 0600) Last BM Date: 01/20/15 Filed Weights   01/20/15 0344 01/22/15 0349 01/23/15 0600  Weight: 177 lb 7.5 oz (80.5 kg) 191 lb 12.8 oz (87 kg) 197 lb 12 oz (89.7 kg)   General: weak, ill, pale.     Heart: RRR. Rate in 90s Chest: BS clear but some upper respiratory track wet cough .  Voice weak.  Abdomen: distended, somewhat tense.  BS quiet. NT.    Extremities: + LE and UE edema.  Feet cool but not cyanotic.  Neuro/Psych:  Confused.  Not oriented to place or year.  Keeps answering "water" to all questions.  Does follow commands.  Moves all 4s.   Intake/Output from previous day: 05/17 0701 - 05/18 0700 In: 3716.5 [I.V.:2731.5; Blood:335; IV Piggyback:650] Out: 2065 [Urine:2065]  Intake/Output this shift: Total I/O In: 110 [I.V.:110] Out: 300 [Urine:300]  Lab Results:  Recent Labs  01/21/15 0930 01/22/15 0449 01/23/15 0447  WBC 33.6* 43.0* 25.2*  HGB 6.8* 7.4* 7.4*  HCT 19.7* 21.4* 22.0*  PLT 143* 146* 134*   BMET  Recent Labs  01/21/15 0930 01/22/15 0449 01/23/15 0447  NA 139 142 144  K 3.9 4.4 4.3  CL 108 117* 120*  CO2 21* 18* 19*  GLUCOSE 276* 165* 175*  BUN 72* 71* 60*  CREATININE 2.28* 1.92* 1.89*  CALCIUM 6.1* 6.1* 6.9*   LFT  Recent Labs  01/21/15 0005  01/21/15 0930 01/23/15 0447  PROT 4.2* 3.6* 4.3*  ALBUMIN 1.6* 1.3* 1.3*  AST 30 34 94*  ALT 32 28 63  ALKPHOS 55 50 66  BILITOT 1.1 1.2 1.1   PT/INR  Recent Labs  01/21/15 0930 01/23/15 0447  LABPROT 18.5* 17.6*  INR 1.52* 1.43   Hepatitis Panel No results for input(s): HEPBSAG, HCVAB, HEPAIGM, HEPBIGM in the last 72 hours.  Studies/Results: Dg Chest Port 1 View  01/23/2015   CLINICAL DATA:  Respiratory failure.  EXAM: PORTABLE CHEST - 1 VIEW  COMPARISON:  01/22/2015.  FINDINGS: Endotracheal tube and IJ line in stable position. Mediastinum and hilar structures are normal. Stable cardiomegaly and pulmonary vascularity. Persistent bibasilar pulmonary infiltrates and pleural effusions. These findings could be related congestive heart failure and/or bilateral pneumonia. No pneumothorax.  IMPRESSION: 1. Endotracheal tube and right IJ line in stable position. 2. Persistent bilateral basilar pulmonary alveolar infiltrates with small pleural effusions. These changes could be related to congestive heart failure and/or bilateral pneumonia. No significant interim change.   Electronically Signed   By: Maisie Fus  Register   On: 01/23/2015 07:10   Dg Chest Port 1 View  01/22/2015   CLINICAL DATA:  Central line placement  EXAM: PORTABLE CHEST - 1 VIEW  COMPARISON:  Portable exam 2037 hours compared to 01/22/2015  FINDINGS: Tip of endotracheal tube projects approximately 4.1 cm above carina.  Interval removal of LEFT jugular line.  New RIGHT jugular central venous catheter with tip projecting over SVC.  Costophrenic angles excluded, repeat image declined by ordering physician.  Bibasilar effusions and atelectasis.  Upper lungs clear.  No pneumothorax.  IMPRESSION: No pneumothorax following RIGHT jugular line placement.  Persistent bibasilar effusions and atelectasis.   Electronically Signed   By: Ulyses Southward M.D.   On: 01/22/2015 21:13   Dg Chest Port 1 View  01/22/2015   CLINICAL DATA:  Shortness of  breath.  Intubation.  EXAM: PORTABLE CHEST - 1 VIEW  COMPARISON:  01/21/2015.  FINDINGS: Endotracheal tube 2.1 cm above the carina. Left IJ line in stable position. Interim removal of NG tube. Stable cardiomegaly with bilateral pulmonary alveolar infiltrates and pleural effusions consistent with congestive heart failure. No pneumothorax.  IMPRESSION: 1. Endotracheal tube 2.1 cm above the carina. Left IJ line in stable position Interim removal of NG tube. 2. Persistent changes of congestive heart failure with bilateral pulmonary edema and pleural effusions.   Electronically Signed   By: Maisie Fus  Register   On: 01/22/2015 07:21   Dg Chest Port 1 View  01/21/2015   CLINICAL DATA:  ET tube placement.  EXAM: PORTABLE CHEST - 1 VIEW  COMPARISON:  01/21/2015  FINDINGS: Endotracheal tube is slightly above the carina, approximately 15 mm, directed toward the right mainstem bronchus. Left central line tip is in the SVC. No pneumothorax. Bilateral lower lobe airspace opacities. Heart is normal size. Possible small layering effusions. NG tube enters the stomach. No acute bony abnormality.  IMPRESSION: Endotracheal tube approximately 15 mm above the carina, directed toward the right mainstem bronchus. This could be retracted 1-2 cm for optimal positioning.  Bibasilar atelectasis or infiltrates. Question small layering effusions.   Electronically Signed   By: Charlett Nose M.D.   On: 01/21/2015 11:57   Dg Chest Port 1 View  01/21/2015   CLINICAL DATA:  Lethargy, shortness of breath, epigastric pain  EXAM: PORTABLE CHEST - 1 VIEW  COMPARISON:  01/19/2015  FINDINGS: Cardiomediastinal silhouette is stable. Persistent bilateral small pleural effusion with bilateral basilar atelectasis or infiltrate. No convincing pulmonary edema.  IMPRESSION: Persistent bilateral small pleural effusion with bilateral basilar atelectasis or infiltrate. No pulmonary edema.   Electronically Signed   By: Natasha Mead M.D.   On: 01/21/2015 08:57    Scheduled Meds: . antiseptic oral rinse  7 mL Mouth Rinse q12n4p  . chlorhexidine  15 mL Mouth Rinse BID  . imipenem-cilastatin  250 mg Intravenous 4 times per day  . insulin aspart  0-15 Units Subcutaneous 6 times per day  . insulin glargine  15 Units Subcutaneous Daily  . latanoprost  1 drop Both Eyes QHS  . [START ON 01/24/2015] pantoprazole (PROTONIX) IV  40 mg Intravenous Q12H  . vancomycin  1,250 mg Intravenous Q24H   Continuous Infusions: . sodium chloride 75 mL/hr at 01/23/15 0800  . sodium chloride Stopped (01/22/15 1700)  . fentaNYL infusion INTRAVENOUS 50 mcg/hr (01/23/15 0800)  . norepinephrine (LEVOPHED) Adult infusion 2 mcg/min (01/23/15 0400)  . pantoprozole (PROTONIX) infusion 8 mg/hr (01/23/15 0800)   PRN Meds:.acetaminophen, acetaminophen, fentaNYL, midazolam, midazolam, ondansetron (ZOFRAN) IV   ASSESMENT:   * Hematemesis, melena. EGD 5/16: severe, ulcerative esophagitis. Blood clots in stomach. D2 edema, c/w pancreatitis effect. On Protonix gtt.   * Biliary pancreatitis.  MRCP not convincing for  CBD stone ("potential 4 mm filling defect") CT 5/13 with CBD stone but not obstructing/ no ductal dilatation.  Lipase normalized, AST increased last 24 hours. .  * 3 vsl CAD. CABG per timing of surgeon/cardiology.  CHF: EF 30-40%.   *  SB ileus seen on 5/10 KUB, resolved by 5/12 xray.   *  Protein malnutrition.     *  Coagulopathy, improved.   * Hypocalcemia, persistent since admission 5/8.   * AKI. Improved.   * Anemia in setting of GIB and acute, multi-organ illness. Hgb improved. S/p PRBCs x 3 thus far.  Hgb stable but low.    * Resp insufficiency. ETT placed 5/16.  ?HCAP: on Vanc/promaxin  * Type2 DM. No insulin at home.     PLAN   *  Portable KUB  To assess for recurrent ileus. Ordered.  *   ? Transfuse with 1 PRBC?  *  Assess swallow and if safe: start po.  Needs nutrition.      Jennye Moccasin  01/23/2015, 8:06 AM Pager:  [phone]  GI ATTENDING  Interval history and data reviewed. Thoughtful cardiology note of Dr. Herbie Baltimore reviewed. Patient personally seen and examined. Family at bedside. Agree with interval progress note. Nice to see him extubated. He has been moving his bowels. No abdominal complaints. Abdominal films this morning were unremarkable. Passing old blood per rectum. My GI recommendations are exactly as yesterday. I reviewed these with the family and the patient. Please contact GI for any questions or problems, as we are readily available. Will sign off. Thank you  Wilhemina Bonito. Eda Keys., M.D. [facility] Division of Gastroenterology

## 2015-01-23 NOTE — Procedures (Signed)
Extubation Procedure Note  Patient Details:   Name: [name] [name] DOB: 01-12-1941 MRN: [id number]   Airway Documentation:     Evaluation  O2 sats: 100  Complications: No apparent complications Patient did tolerate procedure well. Bilateral Breath Sounds: Clear Suctioning: Oral Yes.  Deep oral sxn done. Pt has positive cuff leak. Pt is awake and can follow commands. Per MD order ok to extubate. Extubated pt to 4L [place].  BBS clr. No stridor.  Pt in no distress.  Kandis Nab 01/23/2015, 10:26 AM

## 2015-01-23 NOTE — Progress Notes (Signed)
Initial Nutrition Assessment   INTERVENTION:  Boost Breeze TID  NUTRITION DIAGNOSIS:  Inadequate oral intake related to altered GI function as evidenced by meal completion < 25%.   GOAL:  Patient will meet greater than or equal to 90% of their needs   MONITOR:  PO intake, Supplement acceptance, Labs, Weight trends  REASON FOR ASSESSMENT:  NPO/Clear Liquid Diet    ASSESSMENT:  Admitted 5/8 after a syncopal episode and found to have acute pancreatitis. Also positive troponin/NSTEMI  Transferred to ICU with acute respiratory distress and intubated. EDG shows grade 4 esophagitis with deep ulcers, eschar, friability. OGT left out. No plans for feeding tube.   Per pt his usual weight is 170 lb and has had no recent weight changes and had a good appetite PTA. No hx of drugs/ETOH. Pt positive 6 L.  Nutrition-Focused physical exam completed. Findings are mild fat depletion, no muscle depletion, and mild edema.  Pt on solid food only 2 days in his 10 day admission.  Pt willing to try supplements until PO intake improved and on a solid food diet.   Labs reviewed:  BUN/Cr elevated CBG's: 128-189 Medications reviewed and include: dulcolax (started 5/18), lasix, novolog, reglan   Height:  Ht Readings from Last 1 Encounters:  01/19/15 6' (1.829 m)    Weight:  Wt Readings from Last 1 Encounters:  01/23/15 197 lb 12 oz (89.7 kg)    Ideal Body Weight:  80.9 kg  Wt Readings from Last 10 Encounters:  01/23/15 197 lb 12 oz (89.7 kg)    BMI:  Body mass index is 26.81 kg/(m^2).  Estimated Nutritional Needs:  Kcal:  2000-2000  Protein:  100-115 grams  Fluid:  > 2 L/day  Skin:  Reviewed, no issues  Diet Order:  Diet clear liquid Room service appropriate?: Yes; Fluid consistency:: Thin  EDUCATION NEEDS:  No education needs identified at this time   Intake/Output Summary (Last 24 hours) at 01/23/15 1437 Last data filed at 01/23/15 1400  Gross per 24 hour   Intake 3310.98 ml  Output   2550 ml  Net 760.98 ml    Last BM:  5/16  Kendell Bane RD, LDN, CNSC [phone] Pager [phone] After Hours Pager

## 2015-01-23 NOTE — Progress Notes (Signed)
PULMONARY / CRITICAL CARE MEDICINE   Name: [name] [name] MRN: [id number] DOB: 07-28-1941    ADMISSION DATE:  01/13/2015 CONSULTATION DATE:  01/21/15  REFERRING MD :  Dr. Susie Cassette  CHIEF COMPLAINT:  Pancreatitis, Hypotension   INITIAL PRESENTATION: 74 y/o M admitted 5/8 after a syncopal episode and found to have acute pancreatitis. Also positive troponin/NSTEMI    EVENTS/STUDIES:  5/08  Transfer from [place] after syncopal episode, gallstones, pancreatitis, elevated troponin I, abnormal echocardiogram 5/09  TTE: EF 30-40%, akinesis of basal-midinferior myocardium - severe hypokinesis lateral myocardium - grade 1 DD 5/12  LHC: severe 3 vessel disease  5/13  CT ABD/Pelvis: infiltration edema around pancreas, c/w acute pancreatitis, no abscess, cholelithiasis with stones in CBD, bilateral pleural effusions with compressive atx. 5/15 transfused one unit PRBCs 5/16 Transferred to ICU with acute respiratory distress. Intubated. PCCM consultation. CXR revealed BLL consolidation 5/16 transfused one unit PRBCs 5/16 EGD: grade 4 esophagitiswith deep ulcers, eschar, friability, blood clllots in stomach, no gastric ulcers or mucosal abnormalities, mod edema of 2nd portion of duodenum c/w pancreatitis. Cont PPI infusion initiated. Heparin disconitnued. OGT left out 5/17 RASS 0. CAM-ICU negative. Received one unit PRBCs for Hgb 7.4 in setting of ACS 5/18 CXR with improved basilar aeration. Extubated 5/18 LE venous duplex scans:    INDWELLING DEVICES:: L IJ CVL 5/16 >> 5/17 L radial A-line 5/06 >> 5/18 ETT 5/16 >> 5/18 R IJ CVL 5/17 >>   MICRO DATA: MRSA PCR 5/08 >> NEG C diff 5/14 >> NEG Blood 5/16 >>  Resp 5/17 >>   ANTIMICROBIALS:  Pip-tazo 5/12 >> 5/16 Vanc 5/16 >>  Imipenem 5/16 >>     SUBJECTIVE:  RASS 0, + F//C. Passed SBT. Extubated and tolerating  VITAL SIGNS: Temp:  [97.9 F (36.6 C)-99.4 F (37.4 C)] 98.4 F (36.9 C) (05/18 1505) Pulse Rate:  [86-100] 92 (05/18  1505) Resp:  [10-29] 23 (05/18 1505) BP: (85-133)/(41-58) 113/55 mmHg (05/18 1200) SpO2:  [91 %-100 %] 100 % (05/18 1505) Arterial Line BP: (92-177)/(40-97) 123/50 mmHg (05/18 1505) FiO2 (%):  [30 %] 30 % (05/18 0816) Weight:  [89.7 kg (197 lb 12 oz)] 89.7 kg (197 lb 12 oz) (05/18 0600)   HEMODYNAMICS:     VENTILATOR SETTINGS: Vent Mode:  [-] CPAP;PSV FiO2 (%):  [30 %] 30 % Set Rate:  [15 bmp] 15 bmp Vt Set:  [919 mL] 620 mL PEEP:  [5 cmH20] 5 cmH20 Pressure Support:  [5 cmH20] 5 cmH20 Plateau Pressure:  [15 cmH20-20 cmH20] 20 cmH20   INTAKE / OUTPUT:  Intake/Output Summary (Last 24 hours) at 01/23/15 1656 Last data filed at 01/23/15 1505  Gross per 24 hour  Intake 3375.38 ml  Output   2350 ml  Net 1025.38 ml    PHYSICAL EXAMINATION: General: RASS 0, + F/C Neuro: no focal deficits HEENT: WNL Cardiovascular: reg, no M Lungs: bilateral rhonchi Abdomen:  Mild distention, NT, diminished BS Ext: cool, 1+ symmetric edema  LABS:  CBC  Recent Labs Lab 01/21/15 0930 01/22/15 0449 01/23/15 0447  WBC 33.6* 43.0* 25.2*  HGB 6.8* 7.4* 7.4*  HCT 19.7* 21.4* 22.0*  PLT 143* 146* 134*   Coag's  Recent Labs Lab 01/20/15 2220 01/21/15 0930 01/23/15 0447  APTT 30 28 32  INR 1.22 1.52* 1.43   BMET  Recent Labs Lab 01/21/15 0930 01/22/15 0449 01/23/15 0447  NA 139 142 144  K 3.9 4.4 4.3  CL 108 117* 120*  CO2 21* 18* 19*  BUN 72*  71* 60*  CREATININE 2.28* 1.92* 1.89*  GLUCOSE 276* 165* 175*   Electrolytes  Recent Labs Lab 01/20/15 0338 01/21/15 0005 01/21/15 0930 01/22/15 0449 01/23/15 0447  CALCIUM 6.7* 6.4* 6.1* 6.1* 6.9*  MG 2.1 1.8  --  1.7  --   PHOS 2.4* 3.3  --  3.0  --    Sepsis Markers  Recent Labs Lab 01/21/15 0930 01/21/15 1745 01/23/15 0447  LATICACIDVEN 4.3* 1.6  --   PROCALCITON 1.12  1.33  --  3.01     ABG  Recent Labs Lab 01/21/15 0854 01/21/15 1200 01/22/15 0325  PHART 7.531* 7.409 7.440  PCO2ART 21.7* 34.3*  26.2*  PO2ART 69.4* 374.0* 189*   Liver Enzymes  Recent Labs Lab 01/21/15 0005 01/21/15 0930 01/23/15 0447  AST 30 34 94*  ALT 32 28 63  ALKPHOS 55 50 66  BILITOT 1.1 1.2 1.1  ALBUMIN 1.6* 1.3* 1.3*   Cardiac Enzymes  Recent Labs Lab 01/21/15 0930 01/21/15 1840 01/21/15 2354  TROPONINI 0.86* 3.70* 5.81*   Glucose  Recent Labs Lab 01/22/15 1551 01/22/15 2137 01/23/15 0035 01/23/15 0435 01/23/15 0730 01/23/15 1213  GLUCAP 236* 220* 189* 171* 128* 138*    CXR: improvedbibasilar aeration    ASSESSMENT / PLAN:  PULMONARY A: Acute respiratory failure Suspect component of pulm edema Bibasilar atelectasis - improved 5/18 P:   Monitor in ICU post extubation Supplemental O2 as needed to maintain SpO2 > 92%  CARDIOVASCULAR  A:  NSTEMI  Severe 3 vessel CAD Ischemic cardiomyopathy Shock - cardiogenic +/- septic, resolved SVT - resolved Hx Htn P:  CVP goal 10-14. NS boluses PRN MAP goal > 65 mmHg. Norepi 5/16 - 5/18 Cardiology following  RENAL A:   CKD (baseline Cr 1.54) AKI, nonoliguric Mild NAG acidosis P:   Monitor BMET intermittently Monitor I/Os Correct electrolytes as indicated  GASTROINTESTINAL A:   Gallstone pancreatitis  Severe esophagitis Protein Calorie Malnutrition  P:   SUP: PPI infusion  No G tube due to esophagitis Begin clear liquids and advance as tolerated  HEMATOLOGIC A:   Acute Blood Loss Anemia Mild thrombocytopenia P:  DVT px: SCDs Monitor CBC intermittently Transfuse per usual ICU guidelines noting ACS Holding all anticoagulants inc ASA  INFECTIOUS A:   Severe pancreatitis Suspected HCAP P:   Micro and abx as above  ENDOCRINE A:   DM 2, now controlled P:   Cont Lantus Cont SSI, mod scale  NEUROLOGIC A:   ICU associated discomfort Pain P:   RASS goal: 0 Low dose PRN fentanyl    FAMILY  - Updates: Wife, daughters updated at bedside 5/18  CCM time 35 mins  Billy Fischer, MD ; [facility]  service Mobile [phone].  After 5:30 PM or weekends, call [phone]

## 2015-01-23 NOTE — Progress Notes (Signed)
Subjective: Complaints of chest pain earlier this am on examination, became agitated and tachycardic, later calmed after extubation. Family at bedside.  Objective: Vital signs in last 24 hours: Filed Vitals:   01/23/15 0600 01/23/15 0700 01/23/15 0800 01/23/15 0816  BP:    133/58  Pulse: 90 90 92 89  Temp:   97.9 F (36.6 C)   TempSrc:   Oral   Resp: 19 18 17    Height:      Weight: 197 lb 12 oz (89.7 kg)     SpO2: 100% 100% 100% 100%   Weight change: 5 lb 15.2 oz (2.7 kg)  Intake/Output Summary (Last 24 hours) at 01/23/15 0845 Last data filed at 01/23/15 0800  Gross per 24 hour  Intake 3602.04 ml  Output   2325 ml  Net 1277.04 ml   General appearance: alert, some initial agitation, extubated. Head: Normocephalic, without obvious abnormality, atraumatic Lungs: been extubated, rhonchorous breath sound with some secretions.   Heart: Heard in apical region S1, S2.  Abdomen: Slightly firm, not tender. Extremities: extremities normal, atraumatic, no cyanosis or edema, but Dp pulses present, easier to appreciate on the left.  Tele- Some PVCs, with St depressions present.   Lab Results: Basic Metabolic Panel:  Recent Labs Lab 01/21/15 0005  01/22/15 0449 01/23/15 0447  NA 138  < > 142 144  K 3.2*  < > 4.4 4.3  CL 106  < > 117* 120*  CO2 24  < > 18* 19*  GLUCOSE 303*  < > 165* 175*  BUN 60*  < > 71* 60*  CREATININE 1.71*  < > 1.92* 1.89*  CALCIUM 6.4*  < > 6.1* 6.9*  MG 1.8  --  1.7  --   PHOS 3.3  --  3.0  --   < > = values in this interval not displayed. Liver Function Tests:  Recent Labs Lab 01/21/15 0930 01/23/15 0447  AST 34 94*  ALT 28 63  ALKPHOS 50 66  BILITOT 1.2 1.1  PROT 3.6* 4.3*  ALBUMIN 1.3* 1.3*    Recent Labs Lab 01/18/15 0237 01/21/15 0930  LIPASE 35 22  AMYLASE 121*  --    CBC:  Recent Labs Lab 01/19/15 0459  01/21/15 0930 01/22/15 0449 01/23/15 0447  WBC 16.0*  < > 33.6* 43.0* 25.2*  NEUTROABS 13.8*  --  29.2*  --   --    HGB 14.9  < > 6.8* 7.4* 7.4*  HCT 43.2  < > 19.7* 21.4* 22.0*  MCV 89.1  < > 89.5 87.3 88.7  PLT 151  < > 143* 146* 134*  < > = values in this interval not displayed. Cardiac Enzymes:  Recent Labs Lab 01/21/15 0930 01/21/15 1840 01/21/15 2354  TROPONINI 0.86* 3.70* 5.81*   CBG:  Recent Labs Lab 01/22/15 1137 01/22/15 1551 01/22/15 2137 01/23/15 0035 01/23/15 0435 01/23/15 0730  GLUCAP 193* 236* 220* 189* 171* 128*   Fasting Lipid Panel:  Recent Labs Lab 01/19/15 0459  TRIG 195*   Coagulation:  Recent Labs Lab 01/20/15 2220 01/21/15 0930 01/23/15 0447  LABPROT 15.5* 18.5* 17.6*  INR 1.22 1.52* 1.43   Micro Results: Recent Results (from the past 240 hour(s))  MRSA PCR Screening     Status: None   Collection Time: 01/13/15  6:33 PM  Result Value Ref Range Status   MRSA by PCR NEGATIVE NEGATIVE Final    Comment:        The GeneXpert MRSA Assay (FDA approved  for NASAL specimens only), is one component of a comprehensive MRSA colonization surveillance program. It is not intended to diagnose MRSA infection nor to guide or monitor treatment for MRSA infections.   Clostridium Difficile by PCR     Status: None   Collection Time: 01/19/15  3:12 PM  Result Value Ref Range Status   C difficile by pcr NEGATIVE NEGATIVE Final  Culture, blood (routine x 2)     Status: None (Preliminary result)   Collection Time: 01/21/15  9:30 AM  Result Value Ref Range Status   Specimen Description BLOOD RIGHT ANTECUBITAL  Final   Special Requests Immunocompromised BAA 10CC EACH  Final   Culture   Final           BLOOD CULTURE RECEIVED NO GROWTH TO DATE CULTURE WILL BE HELD FOR 5 DAYS BEFORE ISSUING A FINAL NEGATIVE REPORT Performed at [facility]    Report Status PENDING  Incomplete  Culture, blood (routine x 2)     Status: None (Preliminary result)   Collection Time: 01/21/15  9:40 AM  Result Value Ref Range Status   Specimen Description BLOOD RIGHT ARM   Final   Special Requests Immunocompromised AEB 5CC  Final   Culture   Final           BLOOD CULTURE RECEIVED NO GROWTH TO DATE CULTURE WILL BE HELD FOR 5 DAYS BEFORE ISSUING A FINAL NEGATIVE REPORT Performed at [facility]    Report Status PENDING  Incomplete  Culture, respiratory (NON-Expectorated)     Status: None (Preliminary result)   Collection Time: 01/22/15 12:20 PM  Result Value Ref Range Status   Specimen Description TRACHEAL ASPIRATE  Final   Special Requests NONE  Final   Gram Stain PENDING  Incomplete   Culture   Final    NO GROWTH 1 DAY Performed at [facility]    Report Status PENDING  Incomplete   Studies/Results: Dg Chest Port 1 View  01/23/2015   CLINICAL DATA:  Respiratory failure.  EXAM: PORTABLE CHEST - 1 VIEW  COMPARISON:  01/22/2015.  FINDINGS: Endotracheal tube and IJ line in stable position. Mediastinum and hilar structures are normal. Stable cardiomegaly and pulmonary vascularity. Persistent bibasilar pulmonary infiltrates and pleural effusions. These findings could be related congestive heart failure and/or bilateral pneumonia. No pneumothorax.  IMPRESSION: 1. Endotracheal tube and right IJ line in stable position. 2. Persistent bilateral basilar pulmonary alveolar infiltrates with small pleural effusions. These changes could be related to congestive heart failure and/or bilateral pneumonia. No significant interim change.   Electronically Signed   By: Maisie Fus  Register   On: 01/23/2015 07:10   Dg Chest Port 1 View  01/22/2015   CLINICAL DATA:  Central line placement  EXAM: PORTABLE CHEST - 1 VIEW  COMPARISON:  Portable exam 2037 hours compared to 01/22/2015  FINDINGS: Tip of endotracheal tube projects approximately 4.1 cm above carina.  Interval removal of LEFT jugular line.  New RIGHT jugular central venous catheter with tip projecting over SVC.  Costophrenic angles excluded, repeat image declined by ordering physician.  Bibasilar effusions and  atelectasis.  Upper lungs clear.  No pneumothorax.  IMPRESSION: No pneumothorax following RIGHT jugular line placement.  Persistent bibasilar effusions and atelectasis.   Electronically Signed   By: Ulyses Southward M.D.   On: 01/22/2015 21:13   Dg Chest Port 1 View  01/22/2015   CLINICAL DATA:  Shortness of breath.  Intubation.  EXAM: PORTABLE CHEST - 1 VIEW  COMPARISON:  01/21/2015.  FINDINGS: Endotracheal tube 2.1 cm above the carina. Left IJ line in stable position. Interim removal of NG tube. Stable cardiomegaly with bilateral pulmonary alveolar infiltrates and pleural effusions consistent with congestive heart failure. No pneumothorax.  IMPRESSION: 1. Endotracheal tube 2.1 cm above the carina. Left IJ line in stable position Interim removal of NG tube. 2. Persistent changes of congestive heart failure with bilateral pulmonary edema and pleural effusions.   Electronically Signed   By: Maisie Fus  Register   On: 01/22/2015 07:21   Dg Chest Port 1 View  01/21/2015   CLINICAL DATA:  ET tube placement.  EXAM: PORTABLE CHEST - 1 VIEW  COMPARISON:  01/21/2015  FINDINGS: Endotracheal tube is slightly above the carina, approximately 15 mm, directed toward the right mainstem bronchus. Left central line tip is in the SVC. No pneumothorax. Bilateral lower lobe airspace opacities. Heart is normal size. Possible small layering effusions. NG tube enters the stomach. No acute bony abnormality.  IMPRESSION: Endotracheal tube approximately 15 mm above the carina, directed toward the right mainstem bronchus. This could be retracted 1-2 cm for optimal positioning.  Bibasilar atelectasis or infiltrates. Question small layering effusions.   Electronically Signed   By: Charlett Nose M.D.   On: 01/21/2015 11:57   Dg Chest Port 1 View  01/21/2015   CLINICAL DATA:  Lethargy, shortness of breath, epigastric pain  EXAM: PORTABLE CHEST - 1 VIEW  COMPARISON:  01/19/2015  FINDINGS: Cardiomediastinal silhouette is stable. Persistent bilateral  small pleural effusion with bilateral basilar atelectasis or infiltrate. No convincing pulmonary edema.  IMPRESSION: Persistent bilateral small pleural effusion with bilateral basilar atelectasis or infiltrate. No pulmonary edema.   Electronically Signed   By: Natasha Mead M.D.   On: 01/21/2015 08:57   Medications: I have reviewed the patient's current medications. Scheduled Meds: . antiseptic oral rinse  7 mL Mouth Rinse q12n4p  . chlorhexidine  15 mL Mouth Rinse BID  . imipenem-cilastatin  250 mg Intravenous 4 times per day  . insulin aspart  0-15 Units Subcutaneous 6 times per day  . insulin glargine  15 Units Subcutaneous Daily  . latanoprost  1 drop Both Eyes QHS  . [START ON 01/24/2015] pantoprazole (PROTONIX) IV  40 mg Intravenous Q12H  . vancomycin  1,250 mg Intravenous Q24H   Continuous Infusions: . sodium chloride 75 mL/hr at 01/23/15 0800  . sodium chloride Stopped (01/22/15 1700)  . fentaNYL infusion INTRAVENOUS 50 mcg/hr (01/23/15 0800)  . norepinephrine (LEVOPHED) Adult infusion 2 mcg/min (01/23/15 0400)  . pantoprozole (PROTONIX) infusion 8 mg/hr (01/23/15 0800)   PRN Meds:.acetaminophen, acetaminophen, fentaNYL, midazolam, midazolam, ondansetron (ZOFRAN) IV Assessment/Plan: Principal Problem:   Acute gallstone pancreatitis Active Problems:   Abdominal pain, epigastric   Nausea with vomiting   Elevated LFTs   Gallstones   Syncope   Hypotension   Bradycardia   AKI (acute kidney injury)   Diabetes   Pancreatitis   NSTEMI (non-ST elevated myocardial infarction)   Cardiomyopathy   Choledocholithiasis   Hypocalcemia   Ileus   Acute biliary pancreatitis   Leukocytosis   Syncope and collapse   Acute systolic congestive heart failure   Essential hypertension   Other specified hypotension   Acute kidney injury   Diabetes type 2, uncontrolled   CAD, multiple vessel: CTO of RCA, Cx with severe prox LAD disease   Calculus of gallbladder with acute cholecystitis  without obstruction   Cholecystitis, acute   Metabolic acidosis   Acute systolic CHF (congestive  heart failure)   Acute renal failure syndrome   Acute blood loss anemia   Hemorrhagic shock   Acute respiratory failure with hypoxemia   Septic shock   Ulcerative esophagitis   Hematemesis with nausea   Melena   Demand ischemia of myocardium   Erosive esophagitis   HCAP (healthcare-associated pneumonia)   Shock  Hemorrhagic Shock/Acute Blood Loss Anemia- Hgb-  Still 7.4 this am.  Status post  Total of 3 units of PRBC, Off Pressors and on PPi infusion. EGD done - Erosive  esophagitis . - Goal Hgb 9 - Will order 1 unit of blood transfused  CAD- Milti-vessel dx on Cath- 01/17/2015, planned for CABG when acute issues resolve. Elevated troponin - 5.81, 01/21/2015 after trending down to 0.86, likely due to demand ischemia from Acute Anemia. EKG with new T wave inversions- inferior leads , and St depression- Lateral leads.  - Will discuss with CVTs on plan going forward, pancreatitis is resolving but still with erosive esophagitis which still appears to be bleeding, Hgb- goal for surgery, also with Calculous cholecytitis and Pancreatitis which appears to be improving. - Hold Coreg, & afterload reduction for now, Bp soft, and AKI. - Hold antiplatelet and IV heparin for now. - Is presently on IV lasix  X2 doses - Goal Hgb> 9. Transfuse 1 unit today- order placed. - Conservative management for now, considering significant Co-morbities. With GI BlD (salvage PCI of LAD is not a valid option - best option is hopefully he will survive current GI complications & be able to go for CABG.  CARDIOMYOPATHY: EF 30 - 40%.Beta blocker and hydralazine/nitrates D/c'd. Chest xray with Pulm infiltrates likely due to edema, also with pleural effusion.  - Co-ox today low , with O2 sats- 58.4. - Will start low dose Imdur in the am, Bp soft now, also on IV lasix.  Acute Pancreatitis with Acute calculous  cholecystitis- With elevated LFTs. Appeared to be resolving, but with new elevated WBC as high as 43, with stool negative C.diff. Concern for necrotic pancreas and Ischemic Bowel. On vanc and primaxin. - Per primary and GI - Talk to CVTS for plan going forward.  AKI- Improving. 1.89 today, Peak 2.28- 01/21/2015- likely due to ATN. Making urine- Making urine- out- 2L yesterday. Net 6L since admission. - Bmet Am.  Hypokalemia and hypocalcemia- Albumin low- 1.3. K- 4.3, ionized Ca- 3.9.   Diabetes- On Lantus and SSI. Per primary.  Significant leukocytosis- Improving, 25.2 today, form 43- 01/22/2015. Blood cultures, respiratory cultures 01/21/2015 NGTD. Temp peaked at - 101.8. Pancreatitis improving. On Vanc and primaxin.  Erosive Esophagitis- On PPI infusion. Concerns for Nutrition, Extubated today, if passes swallow test may be able to take PO by tomorrow.  Patient and Family Updated at bedside- 01/23/2015.   LOS: 10 days    Onnie Boer, MD 01/23/2015, 8:45 AM    ATTENDING ATTESTATION  I have seen, examined and evaluated the patient this AM along with Dr. Mariea Clonts on morning rounds we have discussed her findings, examination, and recommendations.   After reviewing all the available data and chart,  I agree with her findings, examination as well as impression recommendations.  Thankfully he has some stabilized now on is off pressors. Unfortunately his blood count did not increase despite getting another unit of blood. I think we need to continue to try to transfuse to get at least above 8 if not 9 with his CAD and myopathy.  Not unexpectedly she did have a bump in creatinine which  is improved. He continues to have urine output. He did have some chest discomfort with worsening ST depressions on EKG. On not undoubtably he had some ischemia. It sounds like he may be stabilizing overall from pancreatitis standpoint, he remains on antibiotics, but by the GI consult services assessment, he  seems to be stabilizing. He has significant erosive esophagitis which certainly does need to heal. Not being anticoagulated for probably about a week.  I have recontacted cardiac surgery and Dr. Donata Clay will reassess the patient probably tomorrow. We'll basically looking to see how close he would be to becoming a cardiac surgical candidate. I main concern is that with his level of instability that he may not be a very good candidate for discharge to home with the existing CAD that he has.  Currently does not have enough blood pressure room to do any medication adjustments. Will add low-dose Imdur.   Big picture was the main issues continue to be figuring out his disposition. The original plan was for him to be discharged and reevaluated for outpatient CABG. However with his now decompensation and his critical coronary disease state and not sure how well he will tolerate discharge. He certainly will not be able to be very active the level of CAD that he has.    Marykay Lex, M.D., M.S. Interventional Cardiologist   Pager # [phone]

## 2015-01-23 NOTE — Progress Notes (Signed)
PT Cancellation Note  Patient Details Name: [name] [name] MRN: [id number] DOB: December 21, 1940   Cancelled Treatment:    Reason Eval/Treat Not Completed: Medical issues which prohibited therapy (Extubated late am.  Nursing asked PT to return in am.  )   Tawni Millers F 01/23/2015, 2:03 PM  [name],PT Acute Rehabilitation [phone] (pager)

## 2015-01-23 NOTE — Progress Notes (Signed)
eLink Physician-Brief Progress Note Patient Name: [name] [name] DOB: Nov 15, 1940 MRN: [id number]   Date of Service  01/23/2015  HPI/Events of Note  Ca++ = 6.3 and albumin = 1.3. Ca++ corrected for albumin = 8.46 (normal = 8.9).  eICU Interventions  Will replete Calcium.      Intervention Category Intermediate Interventions: Electrolyte abnormality - evaluation and management  Sommer,Steven Eugene 01/23/2015, 9:06 PM

## 2015-01-23 NOTE — Care Management Note (Signed)
Case Management Note  Patient Details  Name: [name] [name] MRN: [id number] Date of Birth: Dec 14, 1940  Subjective/Objective:                    Action/Plan:   Expected Discharge Date:                  Expected Discharge Plan:  Home w Home Health Services  In-House Referral:     Discharge planning Services     Post Acute Care Choice:    Choice offered to:     DME Arranged:    DME Agency:     HH Arranged:    HH Agency:     Status of Service:     Medicare Important Message Given:    Date Medicare IM Given:    Medicare IM give by:    Date Additional Medicare IM Given:    Additional Medicare Important Message give by:     If discussed at Long Length of Stay Meetings, dates discussed:  01/24/15  Additional Comments:  Hanley Hays, RN 01/23/2015, 10:57 AM

## 2015-01-23 NOTE — Progress Notes (Signed)
CRITICAL VALUE ALERT  Critical value received:  Calcium 6.3  Date of notification:  01/23/15  Time of notification:  2055  Critical value read back:Yes.    Nurse who received alert:  Ann Maki  MD notified (1st page):  Pola Corn MD  Time of first page:  2100

## 2015-01-24 ENCOUNTER — Inpatient Hospital Stay (HOSPITAL_COMMUNITY): Payer: Medicare Other

## 2015-01-24 ENCOUNTER — Encounter (HOSPITAL_COMMUNITY): Payer: Self-pay | Admitting: Physician Assistant

## 2015-01-24 DIAGNOSIS — I82409 Acute embolism and thrombosis of unspecified deep veins of unspecified lower extremity: Secondary | ICD-10-CM | POA: Diagnosis present

## 2015-01-24 DIAGNOSIS — R609 Edema, unspecified: Secondary | ICD-10-CM

## 2015-01-24 DIAGNOSIS — K810 Acute cholecystitis: Secondary | ICD-10-CM

## 2015-01-24 DIAGNOSIS — D72829 Elevated white blood cell count, unspecified: Secondary | ICD-10-CM

## 2015-01-24 LAB — LIPASE, BLOOD: Lipase: 13 U/L — ABNORMAL LOW (ref 22–51)

## 2015-01-24 LAB — CBC
HCT: 22.9 % — ABNORMAL LOW (ref 39.0–52.0)
Hemoglobin: 7.7 g/dL — ABNORMAL LOW (ref 13.0–17.0)
MCH: 29.7 pg (ref 26.0–34.0)
MCHC: 33.6 g/dL (ref 30.0–36.0)
MCV: 88.4 fL (ref 78.0–100.0)
PLATELETS: 154 10*3/uL (ref 150–400)
RBC: 2.59 MIL/uL — ABNORMAL LOW (ref 4.22–5.81)
RDW: 14.9 % (ref 11.5–15.5)
WBC: 19.6 10*3/uL — ABNORMAL HIGH (ref 4.0–10.5)

## 2015-01-24 LAB — BASIC METABOLIC PANEL
Anion gap: 8 (ref 5–15)
BUN: 47 mg/dL — ABNORMAL HIGH (ref 6–20)
CHLORIDE: 116 mmol/L — AB (ref 101–111)
CO2: 21 mmol/L — AB (ref 22–32)
CREATININE: 2.11 mg/dL — AB (ref 0.61–1.24)
Calcium: 7.1 mg/dL — ABNORMAL LOW (ref 8.9–10.3)
GFR calc Af Amer: 34 mL/min — ABNORMAL LOW (ref >60)
GFR calc non Af Amer: 30 mL/min — ABNORMAL LOW (ref >60)
GLUCOSE: 197 mg/dL — AB (ref 65–99)
Potassium: 4 mmol/L (ref 3.5–5.1)
Sodium: 145 mmol/L (ref 135–145)

## 2015-01-24 LAB — GLUCOSE, CAPILLARY
GLUCOSE-CAPILLARY: 175 mg/dL — AB (ref 65–99)
Glucose-Capillary: 256 mg/dL — ABNORMAL HIGH (ref 65–99)
Glucose-Capillary: 181 mg/dL — ABNORMAL HIGH (ref 65–99)
Glucose-Capillary: 185 mg/dL — ABNORMAL HIGH (ref 65–99)
Glucose-Capillary: 233 mg/dL — ABNORMAL HIGH (ref 65–99)

## 2015-01-24 LAB — HEPATIC FUNCTION PANEL
ALT: 90 U/L — ABNORMAL HIGH (ref 17–63)
AST: 146 U/L — AB (ref 15–41)
Albumin: 1.3 g/dL — ABNORMAL LOW (ref 3.5–5.0)
Alkaline Phosphatase: 62 U/L (ref 38–126)
BILIRUBIN DIRECT: 0.4 mg/dL (ref 0.1–0.5)
BILIRUBIN INDIRECT: 0.7 mg/dL (ref 0.3–0.9)
BILIRUBIN TOTAL: 1.1 mg/dL (ref 0.3–1.2)
Total Protein: 4.5 g/dL — ABNORMAL LOW (ref 6.5–8.1)

## 2015-01-24 LAB — HEMOGLOBIN AND HEMATOCRIT, BLOOD
HEMATOCRIT: 27.4 % — AB (ref 39.0–52.0)
HEMOGLOBIN: 9.4 g/dL — AB (ref 13.0–17.0)

## 2015-01-24 LAB — PREPARE RBC (CROSSMATCH)

## 2015-01-24 LAB — AMYLASE: AMYLASE: 45 U/L (ref 28–100)

## 2015-01-24 LAB — CULTURE, RESPIRATORY

## 2015-01-24 LAB — CULTURE, RESPIRATORY W GRAM STAIN

## 2015-01-24 MED ORDER — RANOLAZINE ER 500 MG PO TB12
500.0000 mg | ORAL_TABLET | Freq: Two times a day (BID) | ORAL | Status: DC
  Administered 2015-01-27 – 2015-01-30 (×5): 500 mg via ORAL
  Filled 2015-01-24 (×18): qty 1

## 2015-01-24 MED ORDER — HEPARIN SODIUM (PORCINE) 1000 UNIT/ML IJ SOLN
INTRAMUSCULAR | Status: AC
  Filled 2015-01-24: qty 1

## 2015-01-24 MED ORDER — MIDAZOLAM HCL 2 MG/2ML IJ SOLN
INTRAMUSCULAR | Status: AC | PRN
  Administered 2015-01-24: 1 mg via INTRAVENOUS

## 2015-01-24 MED ORDER — METOCLOPRAMIDE HCL 5 MG/ML IJ SOLN
5.0000 mg | Freq: Three times a day (TID) | INTRAMUSCULAR | Status: DC
  Administered 2015-01-24 – 2015-01-28 (×13): 5 mg via INTRAVENOUS
  Administered 2015-01-28: 10 mg via INTRAVENOUS
  Administered 2015-01-29: 5 mg via INTRAVENOUS
  Administered 2015-01-29: 10 mg via INTRAVENOUS
  Administered 2015-01-29 – 2015-02-02 (×12): 5 mg via INTRAVENOUS
  Filled 2015-01-24 (×33): qty 1

## 2015-01-24 MED ORDER — FENTANYL CITRATE (PF) 100 MCG/2ML IJ SOLN
INTRAMUSCULAR | Status: AC
  Filled 2015-01-24: qty 2

## 2015-01-24 MED ORDER — FENTANYL CITRATE (PF) 100 MCG/2ML IJ SOLN
INTRAMUSCULAR | Status: AC | PRN
  Administered 2015-01-24: 50 ug via INTRAVENOUS

## 2015-01-24 MED ORDER — FUROSEMIDE 10 MG/ML IJ SOLN
40.0000 mg | Freq: Once | INTRAMUSCULAR | Status: AC
  Administered 2015-01-24: 40 mg via INTRAVENOUS

## 2015-01-24 MED ORDER — LIDOCAINE HCL 1 % IJ SOLN
INTRAMUSCULAR | Status: AC
  Filled 2015-01-24: qty 20

## 2015-01-24 MED ORDER — MIDAZOLAM HCL 2 MG/2ML IJ SOLN
INTRAMUSCULAR | Status: AC
Start: 2015-01-24 — End: 2015-01-25
  Filled 2015-01-24: qty 2

## 2015-01-24 MED ORDER — SODIUM CHLORIDE 0.9 % IV SOLN
Freq: Once | INTRAVENOUS | Status: AC
  Administered 2015-01-24: 14:00:00 via INTRAVENOUS

## 2015-01-24 NOTE — Progress Notes (Signed)
Spoke with RN who recommended that patient's diet begin as clear liquids based on GI hx. Will initiate clear liquids and advance solids once cleared by MD.   Ferdinand Lango MA, CCC-SLP [phone]

## 2015-01-24 NOTE — Progress Notes (Signed)
*  PRELIMINARY RESULTS* Vascular Ultrasound Lower extremity venous duplex has been completed.  Preliminary findings: DVT noted in the Right peroneal veins, Right posterior tibial veins, and Left soleal veins.    Farrel Demark, RDMS, RVT  01/24/2015, 12:56 PM

## 2015-01-24 NOTE — Progress Notes (Addendum)
Physical Therapy Treatment and Re-eval Patient Details Name: [name] [name] MRN: [id number] DOB: 12-Mar-1941 Today's Date: 01/24/2015    History of Present Illness Admitted with epigastric pain significant; also with NSTEMI; Cardiac cath revealed total RCA, total prox cfx, severe prox LAD, 50% dz of dominant diagonal - Cards following - will need eventual CABG - TCTS now following - patient will need to fully recover from his severe acute pancreatitis prior to surgery. Increased WOB and GIB with pt transferred to ICU and intubated 5/16 to 5/18. Resident requesting bed to chair only on 5/19 until cleared by cardiology for ambulation.    PT Comments    Pt admitted with above diagnosis. Pt currently with functional limitations due to endurance deficits.  Pt PA asked PT/OT to take it easy today therefore pt transferred only to chair.  Did well with RW.  Feell that he should progress and be able to go home with HHPT f/u and 24 hour care.  Continue toward original goals set.  Pt will benefit from skilled PT to increase their independence and safety with mobility to allow discharge to the venue listed below.    Follow Up Recommendations  Home health PT;Supervision/Assistance - 24 hour     Equipment Recommendations  3in1 (PT)    Recommendations for Other Services OT consult     Precautions / Restrictions Precautions Precautions: Fall Restrictions Weight Bearing Restrictions: No    Mobility  Bed Mobility Overal bed mobility: Needs Assistance Bed Mobility: Supine to Sit     Supine to sit: Min guard;HOB elevated     General bed mobility comments: extra time, HOB elevated, use of rail  Transfers Overall transfer level: Needs assistance Equipment used: Rolling walker (2 wheeled) Transfers: Sit to/from [employer] Sit to Stand: Min assist Stand pivot transfers: Min assist       General transfer comment: verbal cues for hand placement and sequence, min assist to rise  and to steady.  slightly unsteady stepping feet around to chair.  Needed RW relying on UEs.  SOB noted.     Ambulation/Gait                 Stairs            Wheelchair Mobility    Modified Rankin (Stroke Patients Only)       Balance Overall balance assessment: Needs assistance;History of Falls Sitting-balance support: Feet supported;Bilateral upper extremity supported Sitting balance-Leahy Scale: Poor Sitting balance - Comments: can sit EOB with UE support bil.     Standing balance support: Bilateral upper extremity supported;During functional activity Standing balance-Leahy Scale: Poor Standing balance comment: Relies on RW for balance.                     Cognition Arousal/Alertness: Awake/alert Behavior During Therapy: WFL for tasks assessed/performed Overall Cognitive Status: Impaired/Different from baseline Area of Impairment: Following commands;Problem solving       Following Commands: Follows one step commands with increased time (with multimodal cues, repetition)     Problem Solving: Slow processing      Exercises General Exercises - Lower Extremity Ankle Circles/Pumps: AROM;Both;10 reps;Seated Long Arc Quad: AROM;Both;10 reps;Seated    General Comments        Pertinent Vitals/Pain Pain Assessment: No/denies pain  HR 95 bpm, 113/52 pre BP and 102/48 post PT with pt in chair; 95-100% on 2LO2 with activity.      Home Living Family/patient expects to be discharged to:: Private residence Living Arrangements:  Spouse/significant other Available Help at Discharge: Family;Available 24 hours/day Type of Home: House Home Access: Stairs to enter Entrance Stairs-Rails: Right;Left Home Layout: Two level;Able to live on main level with bedroom/bathroom;Full bath on main level Home Equipment: Walker - 2 wheels      Prior Function Level of Independence: Independent          PT Goals (current goals can now be found in the care plan  section) Acute Rehab PT Goals Patient Stated Goal: to recover well enough to have his CABG surgery Progress towards PT goals: Progressing toward goals    Frequency  Min 3X/week    PT Plan Discharge plan needs to be updated    Co-evaluation PT/OT/SLP Co-Evaluation/Treatment: Yes Reason for Co-Treatment: Complexity of the patient's impairments (multi-system involvement) PT goals addressed during session: Mobility/safety with mobility OT goals addressed during session: ADL's and self-care     End of Session Equipment Utilized During Treatment: Gait belt Activity Tolerance: Patient tolerated treatment well Patient left: in chair;with call bell/phone within reach;with family/visitor present     Time: 0454-0981 PT Time Calculation (min) (ACUTE ONLY): 20 min  Charges:                       G CodesTawni Millers F 02/07/15, 12:48 PM Parthiv Mucci,PT Acute Rehabilitation [phone] (pager)

## 2015-01-24 NOTE — Progress Notes (Signed)
ANTIBIOTIC CONSULT NOTE - FOLLOW UP  Pharmacy Consult for Imipenem & Vancomycin Indication: Pancreatitis & HCAP  No Known Allergies  Patient Measurements: Height: 6' (182.9 cm) Weight: 191 lb 2.2 oz (86.7 kg) IBW/kg (Calculated) : 77.6  Vital Signs: Temp: 98 F (36.7 C) (05/19 1155) Temp Source: Oral (05/19 1155) BP: 106/52 mmHg (05/19 1200) Pulse Rate: 95 (05/19 1200) Intake/Output from previous day: 05/18 0701 - 05/19 0700 In: 3321.7 [P.O.:480; I.V.:1806.7; Blood:385; IV Piggyback:650] Out: 3500 [Urine:3500] Intake/Output from this shift: Total I/O In: 475 [I.V.:375; IV Piggyback:100] Out: 375 [Urine:375]  Labs:  Recent Labs  01/22/15 0449 01/23/15 0447 01/23/15 2000 01/24/15 0500  WBC 43.0* 25.2*  --  19.6*  HGB 7.4* 7.4*  --  7.7*  PLT 146* 134*  --  154  CREATININE 1.92* 1.89* 1.89* 2.11*   Estimated Creatinine Clearance: 34.7 mL/min (by C-G formula based on Cr of 2.11). No results for input(s): VANCOTROUGH, VANCOPEAK, VANCORANDOM, GENTTROUGH, GENTPEAK, GENTRANDOM, TOBRATROUGH, TOBRAPEAK, TOBRARND, AMIKACINPEAK, AMIKACINTROU, AMIKACIN in the last 72 hours.   Microbiology: Recent Results (from the past 720 hour(s))  MRSA PCR Screening     Status: None   Collection Time: 01/13/15  6:33 PM  Result Value Ref Range Status   MRSA by PCR NEGATIVE NEGATIVE Final    Comment:        The GeneXpert MRSA Assay (FDA approved for NASAL specimens only), is one component of a comprehensive MRSA colonization surveillance program. It is not intended to diagnose MRSA infection nor to guide or monitor treatment for MRSA infections.   Clostridium Difficile by PCR     Status: None   Collection Time: 01/19/15  3:12 PM  Result Value Ref Range Status   C difficile by pcr NEGATIVE NEGATIVE Final  Culture, blood (routine x 2)     Status: None (Preliminary result)   Collection Time: 01/21/15  9:30 AM  Result Value Ref Range Status   Specimen Description BLOOD RIGHT  ANTECUBITAL  Final   Special Requests Immunocompromised BAA 10CC EACH  Final   Culture   Final           BLOOD CULTURE RECEIVED NO GROWTH TO DATE CULTURE WILL BE HELD FOR 5 DAYS BEFORE ISSUING A FINAL NEGATIVE REPORT Performed at [facility]    Report Status PENDING  Incomplete  Culture, blood (routine x 2)     Status: None (Preliminary result)   Collection Time: 01/21/15  9:40 AM  Result Value Ref Range Status   Specimen Description BLOOD RIGHT ARM  Final   Special Requests Immunocompromised AEB 5CC  Final   Culture   Final           BLOOD CULTURE RECEIVED NO GROWTH TO DATE CULTURE WILL BE HELD FOR 5 DAYS BEFORE ISSUING A FINAL NEGATIVE REPORT Performed at [facility]    Report Status PENDING  Incomplete  Culture, respiratory (NON-Expectorated)     Status: None   Collection Time: 01/22/15 12:20 PM  Result Value Ref Range Status   Specimen Description TRACHEAL ASPIRATE  Final   Special Requests NONE  Final   Gram Stain   Final    FEW WBC PRESENT,BOTH PMN AND MONONUCLEAR NO SQUAMOUS EPITHELIAL CELLS SEEN NO ORGANISMS SEEN Performed at [facility]    Culture   Final    Non-Pathogenic Oropharyngeal-type Flora Isolated. Performed at [facility]    Report Status 01/24/2015 FINAL  Final    Anti-infectives    Start  Dose/Rate Route Frequency Ordered Stop   01/22/15 0000  imipenem-cilastatin (PRIMAXIN) 250 mg in sodium chloride 0.9 % 100 mL IVPB     250 mg 200 mL/hr over 30 Minutes Intravenous 4 times per day 01/21/15 1947     01/21/15 1400  imipenem-cilastatin (PRIMAXIN) 500 mg in sodium chloride 0.9 % 100 mL IVPB  Status:  Discontinued     500 mg 200 mL/hr over 30 Minutes Intravenous 3 times per day 01/21/15 1059 01/21/15 1947   01/21/15 1000  vancomycin (VANCOCIN) 1,250 mg in sodium chloride 0.9 % 250 mL IVPB  Status:  Discontinued     1,250 mg 166.7 mL/hr over 90 Minutes Intravenous Every 24 hours 01/21/15 0859 01/24/15 0853   01/21/15  0900  ceFEPIme (MAXIPIME) 2 g in dextrose 5 % 50 mL IVPB  Status:  Discontinued     2 g 100 mL/hr over 30 Minutes Intravenous Every 24 hours 01/21/15 0859 01/21/15 1049   01/18/15 0800  piperacillin-tazobactam (ZOSYN) IVPB 3.375 g  Status:  Discontinued     3.375 g 12.5 mL/hr over 240 Minutes Intravenous Every 8 hours 01/17/15 2237 01/18/15 1454   01/17/15 2245  piperacillin-tazobactam (ZOSYN) IVPB 3.375 g     3.375 g 100 mL/hr over 30 Minutes Intravenous STAT 01/17/15 2236 01/17/15 2323      Assessment: 74yo male experienced syncopal event, tx'd from [facility] for acute gallstone pancreatitis, also noted w/ AKI.  Elevated wbc (30.2K), afebrile, hypotensive, LA 4.3 elevated, and tachycardic. Pharmacy was consulted to dose impenem in the setting of acute pancreatitis and suspected HCAP.    Elevated wbc 25 > 19.6, LA 4.3 > 1.6, PCT 3.01.  Tmax 100.8, now Afeb. CXR 5/18 persistent bilat basilar pulmonary infiltrates w/ pleural effusions no change.   SCr up 1.89 > 2.11, UO 1.7, Crcl ~ 35-40 ml/min.  Imipenem 5/16 >>  Vancomycin 5/16 >> 5/19  Cefepime 5/16 >> 5/16  Zosyn 5/12 >> 5/13   5/14 C-diff - neg  5/16 blood x 2 >>  5/17 Resp >> NG  Plan:  - Continue imipenem 250 mg IV Q6H - Monitor for clinical efficacy and renal function - Follow up cultures  Red Christians, Pharm. D. Clinical Pharmacy Resident Pager: [phone] Ph: [phone] 01/24/2015 1:35 PM

## 2015-01-24 NOTE — Progress Notes (Signed)
Subjective: Complaints of persistent chest discomfort, on Imdur. Denies pain of burning center of his chest. On O2 by nasal canula. Failed swallow eval. Had 2 bowel movements overnight. Was dark- maroon, with some blood. Stools this am, dark without blood.  Objective: Vital signs in last 24 hours: Filed Vitals:   01/24/15 0500 01/24/15 0600 01/24/15 0700 01/24/15 0800  BP: 106/55 100/46 100/44 101/40  Pulse: 94 94 90 90  Temp:      TempSrc:      Resp: 28 27 28 30   Height:      Weight: 191 lb 2.2 oz (86.7 kg)     SpO2: 96% 95% 96% 97%   Weight change: -6 lb 9.8 oz (-3 kg)  Intake/Output Summary (Last 24 hours) at 01/24/15 0935 Last data filed at 01/24/15 0800  Gross per 24 hour  Intake 3186.67 ml  Output   3300 ml  Net -113.33 ml   General appearance: Alert, cooperative, not in anay distress Head: Normocephalic, without obvious abnormality, atraumatic Lungs: Coarse breath sounds   Heart: Heard in apical region S1, S2.  Abdomen: Slightly firm, not tender, sparse bowel sounds Extremities: extremities normal, atraumatic, no cyanosis or edema, but Dp pulses present, easier to appreciate on the left.  Tele- PVCs few, with St depressions present. Irregular heart rate- noted on tele, but P wave present, likely Sinus arryhthmia.  Lab Results: Basic Metabolic Panel:  Recent Labs Lab 01/21/15 0005  01/22/15 0449  01/23/15 2000 01/24/15 0500  NA 138  < > 142  < > 144 145  K 3.2*  < > 4.4  < > 3.8 4.0  CL 106  < > 117*  < > 118* 116*  CO2 24  < > 18*  < > 20* 21*  GLUCOSE 303*  < > 165*  < > 282* 197*  BUN 60*  < > 71*  < > 47* 47*  CREATININE 1.71*  < > 1.92*  < > 1.89* 2.11*  CALCIUM 6.4*  < > 6.1*  < > 6.3* 7.1*  MG 1.8  --  1.7  --   --   --   PHOS 3.3  --  3.0  --   --   --   < > = values in this interval not displayed. Liver Function Tests:  Recent Labs Lab 01/21/15 0930 01/23/15 0447  AST 34 94*  ALT 28 63  ALKPHOS 50 66  BILITOT 1.2 1.1  PROT 3.6* 4.3*    ALBUMIN 1.3* 1.3*    Recent Labs Lab 01/18/15 0237 01/21/15 0930  LIPASE 35 22  AMYLASE 121*  --    CBC:  Recent Labs Lab 01/19/15 0459  01/21/15 0930  01/23/15 0447 01/24/15 0500  WBC 16.0*  < > 33.6*  < > 25.2* 19.6*  NEUTROABS 13.8*  --  29.2*  --   --   --   HGB 14.9  < > 6.8*  < > 7.4* 7.7*  HCT 43.2  < > 19.7*  < > 22.0* 22.9*  MCV 89.1  < > 89.5  < > 88.7 88.4  PLT 151  < > 143*  < > 134* 154  < > = values in this interval not displayed. Cardiac Enzymes:  Recent Labs Lab 01/21/15 0930 01/21/15 1840 01/21/15 2354  TROPONINI 0.86* 3.70* 5.81*   CBG:  Recent Labs Lab 01/23/15 1213 01/23/15 1631 01/23/15 1950 01/23/15 2321 01/24/15 0331 01/24/15 0727  GLUCAP 138* 213* 256* 182* 181* 185*  Fasting Lipid Panel:  Recent Labs Lab 01/19/15 0459  TRIG 195*   Coagulation:  Recent Labs Lab 01/20/15 2220 01/21/15 0930 01/23/15 0447  LABPROT 15.5* 18.5* 17.6*  INR 1.22 1.52* 1.43   Micro Results: Recent Results (from the past 240 hour(s))  Clostridium Difficile by PCR     Status: None   Collection Time: 01/19/15  3:12 PM  Result Value Ref Range Status   C difficile by pcr NEGATIVE NEGATIVE Final  Culture, blood (routine x 2)     Status: None (Preliminary result)   Collection Time: 01/21/15  9:30 AM  Result Value Ref Range Status   Specimen Description BLOOD RIGHT ANTECUBITAL  Final   Special Requests Immunocompromised BAA 10CC EACH  Final   Culture   Final           BLOOD CULTURE RECEIVED NO GROWTH TO DATE CULTURE WILL BE HELD FOR 5 DAYS BEFORE ISSUING A FINAL NEGATIVE REPORT Performed at [facility]    Report Status PENDING  Incomplete  Culture, blood (routine x 2)     Status: None (Preliminary result)   Collection Time: 01/21/15  9:40 AM  Result Value Ref Range Status   Specimen Description BLOOD RIGHT ARM  Final   Special Requests Immunocompromised AEB 5CC  Final   Culture   Final           BLOOD CULTURE RECEIVED NO GROWTH  TO DATE CULTURE WILL BE HELD FOR 5 DAYS BEFORE ISSUING A FINAL NEGATIVE REPORT Performed at [facility]    Report Status PENDING  Incomplete  Culture, respiratory (NON-Expectorated)     Status: None   Collection Time: 01/22/15 12:20 PM  Result Value Ref Range Status   Specimen Description TRACHEAL ASPIRATE  Final   Special Requests NONE  Final   Gram Stain   Final    FEW WBC PRESENT,BOTH PMN AND MONONUCLEAR NO SQUAMOUS EPITHELIAL CELLS SEEN NO ORGANISMS SEEN Performed at [facility]    Culture   Final    Non-Pathogenic Oropharyngeal-type Flora Isolated. Performed at [facility]    Report Status 01/24/2015 FINAL  Final   Studies/Results: Dg Chest Port 1 View  01/24/2015   CLINICAL DATA:  Respiratory failure.  EXAM: PORTABLE CHEST - 1 VIEW  COMPARISON:  01/23/2015 .  FINDINGS: Interim extubation. Right IJ line in stable position. Mediastinum and hilar structures stable. Cardiomegaly. Bilateral pulmonary alveolar infiltrates and pleural effusions. These findings are consistent with congestive heart failure. Bilateral pneumonia cannot be excluded. No pneumothorax.  IMPRESSION: 1. Interim removal of endotracheal tube. Right IJ line in stable position. 2. Persistent cardiomegaly with progressed bilateral prominent pulmonary alveolar infiltrates and pleural effusions consistent with congestive heart failure. Bilateral pneumonia cannot be excluded.   Electronically Signed   By: Maisie Fus  Register   On: 01/24/2015 07:31   Dg Chest Port 1 View  01/23/2015   CLINICAL DATA:  Respiratory failure.  EXAM: PORTABLE CHEST - 1 VIEW  COMPARISON:  01/22/2015.  FINDINGS: Endotracheal tube and IJ line in stable position. Mediastinum and hilar structures are normal. Stable cardiomegaly and pulmonary vascularity. Persistent bibasilar pulmonary infiltrates and pleural effusions. These findings could be related congestive heart failure and/or bilateral pneumonia. No pneumothorax.   IMPRESSION: 1. Endotracheal tube and right IJ line in stable position. 2. Persistent bilateral basilar pulmonary alveolar infiltrates with small pleural effusions. These changes could be related to congestive heart failure and/or bilateral pneumonia. No significant interim change.   Electronically Signed  ByMaisie Fus  Register   On: 01/23/2015 07:10   Dg Chest Port 1 View  01/22/2015   CLINICAL DATA:  Central line placement  EXAM: PORTABLE CHEST - 1 VIEW  COMPARISON:  Portable exam 2037 hours compared to 01/22/2015  FINDINGS: Tip of endotracheal tube projects approximately 4.1 cm above carina.  Interval removal of LEFT jugular line.  New RIGHT jugular central venous catheter with tip projecting over SVC.  Costophrenic angles excluded, repeat image declined by ordering physician.  Bibasilar effusions and atelectasis.  Upper lungs clear.  No pneumothorax.  IMPRESSION: No pneumothorax following RIGHT jugular line placement.  Persistent bibasilar effusions and atelectasis.   Electronically Signed   By: Ulyses Southward M.D.   On: 01/22/2015 21:13   Dg Abd Portable 1v  01/23/2015   CLINICAL DATA:  Followup adynamic ileus.  EXAM: PORTABLE ABDOMEN - 1 VIEW  COMPARISON:  CT, 01/18/2015  FINDINGS: Normal bowel gas pattern. No evidence of bowel obstruction or significant adynamic ileus.  A round density projects in the right mid abdomen. This could reflect a gallstone. There other densities are upper quadrant consistent with the known gallstones.  Mild vascular calcifications.  Soft tissues otherwise unremarkable.  IMPRESSION: 1. No evidence of bowel obstruction. No evidence of a significant residual adynamic ileus. No acute finding.   Electronically Signed   By: Amie Portland M.D.   On: 01/23/2015 12:48   Medications: I have reviewed the patient's current medications. Scheduled Meds: . sodium chloride   Intravenous Once  . feeding supplement (RESOURCE BREEZE)  1 Container Oral TID BM  . imipenem-cilastatin  250 mg  Intravenous 4 times per day  . insulin aspart  0-15 Units Subcutaneous 6 times per day  . latanoprost  1 drop Both Eyes QHS  . metoCLOPramide (REGLAN) injection  5 mg Intravenous 3 times per day  . pantoprazole (PROTONIX) IV  40 mg Intravenous Q12H   Continuous Infusions: . sodium chloride Stopped (01/22/15 1700)  . dextrose 50 mL/hr at 01/24/15 0900   PRN Meds:.acetaminophen, acetaminophen, bisacodyl, fentaNYL (SUBLIMAZE) injection, ondansetron (ZOFRAN) IV Assessment/Plan: Principal Problem:   Acute gallstone pancreatitis Active Problems:   Abdominal pain, epigastric   Nausea with vomiting   Elevated LFTs   Gallstones   Syncope   Hypotension   Bradycardia   AKI (acute kidney injury)   Diabetes   Pancreatitis   NSTEMI (non-ST elevated myocardial infarction)   Cardiomyopathy   Choledocholithiasis   Hypocalcemia   Ileus   Acute biliary pancreatitis   Leukocytosis   Syncope and collapse   Acute systolic congestive heart failure   Essential hypertension   Other specified hypotension   Acute kidney injury   Diabetes type 2, uncontrolled   CAD, multiple vessel: CTO of RCA, Cx with severe prox LAD disease   Calculus of gallbladder with acute cholecystitis without obstruction   Cholecystitis, acute   Metabolic acidosis   Acute systolic CHF (congestive heart failure)   Acute renal failure syndrome   Acute blood loss anemia   Hemorrhagic shock   Acute respiratory failure with hypoxemia   Septic shock   Ulcerative esophagitis   Hematemesis with nausea   Melena   Demand ischemia of myocardium   Erosive esophagitis   HCAP (healthcare-associated pneumonia)   Shock  Hemorrhagic Shock/Acute Blood Loss Anemia- Hgb-  Still 7.7 this am. Status post now 4 units. Off Pressors and on PPi infusion. EGD done - Erosive  Esophagitis. Dark stools. - Goal Hgb >8 - Consideration for IV  iron in the near future, on antibiotics presently.  - Transfer off CCU, but remain in 2H step  down.  CAD- Milti-vessel dx on Cath- 01/17/2015, planned for CABG when acute issues resolve. Persistent -chest pain. Anemia likely provoking angina, also with persistent ST depressions. Imdur d/c likely due to low BP. - CVTS resconsulted - Hold antiplatelet and IV heparin for now. - Conservative management for now, considering significant Co-morbities. - Will start Ranolazine for angina, will not affect BP. - Limited activity in bedroom- With significant CAD, Hypotension and anemia. - Await CVTS recs.  CARDIOMYOPATHY: EF 30 - 40%.No Beta blocker and hydralazine for now. Chest xray with Pulm infiltrates likely due to edema, also with pleural effusion. Co-ox today low , with O2 sats- 58.4. - On ranolazine - IV lasix 40 daily  Acute Pancreatitis with Acute calculous cholecystitis- Per GI. Improving leukocytosis. - Awit and appreciate CVTs recs - Will order Lipase, LFTs and Amylase.  AKI- 2.11 today, slight worsening form 1.89 yesterday. Peak 2.28- 01/21/2015- likely due to ATN, and diuretics, Making urine- Making urine- out- 3.5L  Yesterday, but total input was 3.3L. So still Net positive 6L since admission. - Bmet Am.  Erosive Esophagitis- Still appears to be bleeding. Failed swallow eval.  - ON PPI infusion. - NPO  Hypokalemia and hypocalcemia- Albumin low- 1.3. K- 4.3, ionized Ca- 3.9. Supplimented with 1g ca gluconate.  Diabetes- On Lantus and SSI. Per primary.  Significant leukocytosis- Improving, 219.6 today trended down from 43- 01/22/2015. Blood cultures, respiratory cultures 01/21/2015 NGTD.  Pancreatitis improving. On primaxin. Vanc D/cd.  Patient and Family Updated at bedside- 01/24/2015.   LOS: 11 days    Ejiroghene E. Emokpae IMTS, PGY-2 01/24/2015, 9:35 AM    ATTENDING ATTESTATION  I have seen, examined and evaluated the patient this AM along with Dr. Mariea Clonts on AM rounds & discussed the patient with impression & recommendations.  After reviewing all the available  data and chart,  I agree with her findings, examination as well as impression recommendations.   Plan today - transfuse 1 unit PRBC with IV lasix - goal Hgb >8 OK to Transfer to TCU - but would not move out of 2H over the weekend Suspect that Cr increase is related to diuresis - but effusions /infitrate improved on CXR On Abx for ? PNA / Pancreatitis - defer to PCCM/Medicine  Big picture discussions today - Dr. Maren Beach will see today to give update on pre-op expectations  Keep Hgb>8  Need pancreatitis & gallstones cooled - checking amylase, lipase & LFTs.  Needs nutrition - hopefully he will pass Mod barium swallow today & be able to start PO feeding  Still has some angina - no BP room for Imdur - change to Ranexa; No antiplatelet agent with active GIB  Have d/w Dr. Gala Romney to review CHF Rx recommendations (he may round on him over the weekend)  ? After ~3-4 stable days of no bleeding & taking PO - maybe transfer to Tele & consider short term rehab to allow for him to build up strength for CABG (I do not think that d/c home for OP CABG would be a good idea).    Marykay Lex, M.D., M.S. Interventional Cardiologist   Pager # [phone]

## 2015-01-24 NOTE — Progress Notes (Signed)
MBSS complete. Full report located under chart review in imaging section.  Marzetta Lanza MA, CCC-SLP [phone]   

## 2015-01-24 NOTE — Evaluation (Signed)
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 Increased WOB and GIB with pt transferred to ICU and intubated 5/16 to 5/18. Resident requesting bed to chair only on 5/19 until cleared by cardiology for ambulation.   Clinical Impression   Limited evaluation at request of resident to OOB to chair with close monitoring.  Pt requiring min assist for all mobility. Demonstrates decreased activity tolerance, impaired balance, and some slow processing and need for repetition of verbal commands intermittently. Pt grateful to be out of bed.  Will follow acutely for ADL training and to instruct in energy conservation strategies.    Follow Up Recommendations  Home health OT    Equipment Recommendations  Other (comment) (to be assessed)    Recommendations for Other Services       Precautions / Restrictions Precautions Precautions: Fall Restrictions Weight Bearing Restrictions: No      Mobility Bed Mobility Overal bed mobility: Needs Assistance Bed Mobility: Supine to Sit     Supine to sit: Min guard;HOB elevated     General bed mobility comments: extra time, HOB elevated, use of rail  Transfers Overall transfer level: Needs assistance Equipment used: Rolling walker (2 wheeled) Transfers: Sit to/from [employer] Sit to Stand: Min assist Stand pivot transfers: Min assist       General transfer comment: verbal cues for hand placement and sequence, min assist to rise and to steady    Balance Overall balance assessment: Needs assistance Sitting-balance  support: Feet supported                                        ADL Overall ADL's : Needs assistance/impaired Eating/Feeding: Sitting;Set up   Grooming: Set up;Sitting   Upper Body Bathing: Minimal assitance;Sitting   Lower Body Bathing: Minimal assistance;Sit to/from stand   Upper Body Dressing : Minimal assistance;Sitting   Lower Body Dressing: Minimal assistance;Sit to/from stand   Toilet Transfer: Minimal assistance;Stand-pivot;RW Statistician Details (indicate cue type and reason): simulated bed to recliner Toileting- Clothing Manipulation and Hygiene: Minimal assistance;Sit to/from stand               Vision     Perception     Praxis      Pertinent Vitals/Pain Pain Assessment: No/denies pain     Hand Dominance Right   Extremity/Trunk Assessment Upper Extremity Assessment Upper Extremity Assessment: Generalized weakness (mild edema R UE)   Lower Extremity Assessment Lower Extremity Assessment: Defer to PT evaluation   Cervical / Trunk Assessment Cervical / Trunk Assessment: Normal   Communication Communication Communication: No difficulties   Cognition Arousal/Alertness: Awake/alert Behavior During Therapy: WFL for tasks assessed/performed Overall Cognitive Status: Impaired/Different from baseline Area of Impairment: Following commands;Problem solving       Following Commands: Follows one step commands with increased time (with multimodal cues, repetition)     Problem Solving: Slow processing     General Comments       Exercises       Shoulder Instructions  Home Living Family/patient expects to be discharged to:: Private residence Living Arrangements: Spouse/significant other Available Help at Discharge: Family;Available 24 hours/day Type of Home: House Home Access: Stairs to enter [employer] of Steps: 3 Entrance Stairs-Rails: Right;Left Home Layout: Two level;Able to live on main level with  bedroom/bathroom;Full bath on main level               Home Equipment: Walker - 2 wheels          Prior Functioning/Environment Level of Independence: Independent             OT Diagnosis: Generalized weakness;Cognitive deficits   OT Problem List: Decreased strength;Decreased activity tolerance;Impaired balance (sitting and/or standing);Decreased cognition;Decreased knowledge of use of DME or AE;Cardiopulmonary status limiting activity   OT Treatment/Interventions:      OT Goals(Current goals can be found in the care plan section) Acute Rehab OT Goals Patient Stated Goal: to recover well enough to have his CABG surgery OT Goal Formulation: With patient Time For Goal Achievement: 02/07/15 Potential to Achieve Goals: Good ADL Goals Pt Will Perform Grooming: with supervision;standing Pt Will Perform Upper Body Bathing: with set-up;sitting Pt Will Perform Lower Body Bathing: with supervision;sit to/from stand Pt Will Perform Upper Body Dressing: with set-up;sitting Pt Will Perform Lower Body Dressing: with supervision;sit to/from stand Pt Will Transfer to Toilet: with supervision;ambulating Pt Will Perform Toileting - Clothing Manipulation and hygiene: with supervision;sit to/from stand Additional ADL Goal #1: Pt will generalize energy conservation strategies in ADL with supervision.  OT Frequency: Min 2X/week   Barriers to D/C:            Co-evaluation PT/OT/SLP Co-Evaluation/Treatment: Yes Reason for Co-Treatment: Complexity of the patient's impairments (multi-system involvement);For patient/therapist safety   OT goals addressed during session: ADL's and self-care      End of Session Equipment Utilized During Treatment: Rolling walker;Gait belt Nurse Communication: Mobility status  Activity Tolerance: Patient tolerated treatment well Patient left: in chair;with call bell/phone within reach;with family/visitor present   Time: 9604-5409 OT Time Calculation  (min): 21 min Charges:  OT General Charges $OT Visit: 1 Procedure OT Evaluation $Initial OT Evaluation Tier I: 1 Procedure G-Codes:    Evern Bio 01/24/2015, 12:27 PM  [phone]

## 2015-01-24 NOTE — H&P (Signed)
Chief Complaint: DVT, Grade 4 Stomach ulcers w/ bleeding  Referring Physician(s): Dr. Herbie Baltimore  History of Present Illness: [name] [name] is a 74 y.o. male w/ NSTEMI, Grade 4 stomach ulcers, and Acute DVT in the right leg. He cannot be anticoagulated due to anemia due to bleeding ulcers. We are asked to evaluated pt for IVC Filter.  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  Past Medical History  Diagnosis Date  . Hypertension   . Diabetes mellitus type 2 in obese   . Cholelithiasis     Past Surgical History  Procedure Laterality Date  . Cardiac catheterization N/A 01/17/2015    Procedure: Left Heart Cath and Coronary Angiography;  Surgeon: Lyn Records, MD;  Location: [facility] INVASIVE CV LAB;  Service: Cardiovascular;  Laterality: N/A;  . Esophagogastroduodenoscopy N/A 01/21/2015    Procedure: ESOPHAGOGASTRODUODENOSCOPY (EGD);  Surgeon: Hilarie Fredrickson, MD;  Location: [facility] ENDOSCOPY;  Service: Endoscopy;  Laterality: N/A;  to be done at BEDSIDE    Allergies: Review of patient's allergies indicates no known allergies.  Medications: Prior to Admission medications   Medication Sig Start Date End Date Taking? Authorizing Provider  atorvastatin (LIPITOR) 10 MG tablet Take 10 mg by mouth daily.   Yes Historical Provider, MD  glipiZIDE (GLUCOTROL XL) 10 MG 24 hr tablet Take 10 mg by mouth 2 (two) times daily. 12/19/14  Yes Historical Provider, MD  latanoprost (XALATAN) 0.005 % ophthalmic solution Place 1 drop into both eyes at bedtime. 01/02/15  Yes Historical Provider, MD  lisinopril-hydrochlorothiazide (PRINZIDE,ZESTORETIC) 20-12.5 MG per tablet Take 1 tablet by mouth daily.   Yes Historical Provider, MD  sildenafil (VIAGRA) 100 MG tablet Take 100 mg by mouth daily as needed for erectile dysfunction.   Yes Historical Provider, MD  sitaGLIPtin (JANUVIA) 100 MG tablet Take 100 mg by mouth daily.   Yes Historical Provider, MD  vardenafil (LEVITRA) 20 MG tablet Take 20 mg by mouth daily  as needed for erectile dysfunction.   Yes Historical Provider, MD     Family History  Problem Relation Age of Onset  . Heart attack Brother 55  . Pancreatic cancer Sister 10    died in her 84s.     History   Social History  . Marital Status: Married    Spouse Name: N/A  . Number of Children: N/A  . Years of Education: N/A   Social History Main Topics  . Smoking status: Never Smoker   . Smokeless tobacco: Not on file  . Alcohol Use: No  . Drug Use: No  . Sexual Activity: Yes   Other Topics Concern  . None   Social History Narrative     Review of Systems: A 12 point ROS discussed and pertinent positives are indicated in the HPI above.  All other systems are negative.  Review of Systems  Constitutional: Positive for activity change. Negative for fever and appetite change.  Respiratory: Negative for chest tightness, shortness of breath and wheezing.   Cardiovascular: Positive for leg swelling. Negative for chest pain.  Gastrointestinal: Negative for abdominal pain.  Endocrine: Negative.   Genitourinary: Negative.   Musculoskeletal: Negative.   Skin: Negative.   Neurological: Negative.   Psychiatric/Behavioral: Negative.     Vital Signs: BP 125/49 mmHg  Pulse 85  Temp(Src) 97.5 F (36.4 C) (Oral)  Resp 27  Ht 6' (1.829 m)  Wt 191 lb 2.2 oz (86.7 kg)  BMI 25.92 kg/m2  SpO2 97%  Physical Exam  Constitutional: He is oriented to person, place, and time. He appears well-developed and well-nourished.  HENT:  Head: Normocephalic and atraumatic.  Eyes: EOM are normal.  Neck: Normal range of motion. Neck supple.  Cardiovascular: Normal rate, regular rhythm and normal heart sounds.   Pulmonary/Chest: Effort normal and breath sounds normal.  Abdominal: Soft. Bowel sounds are normal. He exhibits no distension. There is no tenderness.  Musculoskeletal: Normal range of motion.  Neurological: He is alert and oriented to person, place, and time.  Skin: Skin is warm and  dry.  Psychiatric: He has a normal mood and affect. His behavior is normal. Judgment and thought content normal.  Nursing note and vitals reviewed.   Mallampati Score:  MD Evaluation Airway: WNL Heart: WNL Abdomen: WNL Chest/ Lungs: WNL ASA  Classification: 3 Mallampati/Airway Score: Two  Imaging: Ct Abdomen Pelvis Wo Contrast  01/18/2015   CLINICAL DATA:  Nausea and vomiting after cardiac catheterization yesterday.  EXAM: CT ABDOMEN AND PELVIS WITHOUT CONTRAST  TECHNIQUE: Multidetector CT imaging of the abdomen and pelvis was performed following the standard protocol without IV contrast.  COMPARISON:  Abdomen 01/17/2015  FINDINGS: Bilateral pleural effusions with basilar atelectasis. Coronary artery calcifications. Residual contrast material in the esophagus may represent reflux or dysmotility.  Cholelithiasis with multiple small stones in the gallbladder. No gallbladder wall thickening. Stones are present in the common bile duct. No significant bile duct dilatation. There is diffuse inflammatory infiltration and edema in the fat around the pancreas consistent with acute pancreatitis. Calcifications in the head of the pancreas may represent stones or postinflammatory calcifications. No loculated fluid collection is demonstrated to suggest abscess.  The unenhanced appearance of the liver, spleen, adrenal glands, inferior vena cava, and retroperitoneal lymph nodes is unremarkable. Calcification of the aorta without aneurysm. Bilateral low-attenuation renal lesions likely representing cyst but too small to characterize. No hydronephrosis in either stomach, small bowel, and colon are not abnormally distended. There is suggestion of wall thickening in some of the small bowel loops possibly indicating enteritis. Kidney. Small amount of free fluid in the abdomen and pelvis. No free air.  Pelvis: Appendix is normal. Bladder wall is not thickened. Prostate gland is not enlarged. No pelvic mass or  lymphadenopathy. Degenerative changes in the spine. No destructive bone lesions. Diffuse subcutaneous soft tissue edema.  IMPRESSION: Infiltration edema around the pancreas with fluid in the pericolic gutters and pelvis. Changes are consistent with acute pancreatitis. No abscess identified. Cholelithiasis with stones appearing in the common bile duct. No bile duct dilatation. Bilateral pleural effusions with basilar atelectasis.   Electronically Signed   By: Burman Nieves M.D.   On: 01/18/2015 03:22   Dg Abd 1 View  01/15/2015   CLINICAL DATA:  Ileus  EXAM: ABDOMEN - 1 VIEW  COMPARISON:  None.  FINDINGS: Scattered large and small bowel gas is noted. Mildly dilated loops of small bowel are to in the mid abdomen. No free air is noted. No definitive obstructive changes are seen.  IMPRESSION: Mild dilatation of small bowel likely representing a small-bowel ileus. Correlation with the physical exam is recommended.   Electronically Signed   By: Alcide Clever M.D.   On: 01/15/2015 09:18   US Renal  01/16/2015   CLINICAL DATA:  Acute renal failure  EXAM: RENAL / URINARY TRACT ULTRASOUND COMPLETE  COMPARISON:  None.  FINDINGS: Right Kidney:  Length: 11.4 cm. Echogenicity within normal limits. No mass or hydronephrosis visualized.  Left Kidney:  Length: 11.9 cm. Echogenicity within normal  limits. No mass or hydronephrosis visualized.  Bladder:  Appears normal for degree of bladder distention.  Small amount of ascites.  IMPRESSION: Normal renal ultrasound.   Electronically Signed   By: Amie Portland M.D.   On: 01/16/2015 15:58   Mr Abdomen Mrcp Wo Cm  01/16/2015   CLINICAL DATA:  Cholelithiasis with abdominal pain.  EXAM: MRI ABDOMEN WITHOUT CONTRAST  (INCLUDING MRCP)  TECHNIQUE: Multiplanar multisequence MR imaging of the abdomen was performed. Heavily T2-weighted images of the biliary and pancreatic ducts were obtained, and three-dimensional MRCP images were rendered by post processing.  COMPARISON:  None.   FINDINGS: Exam is limited due to patient motion. Patient had difficult tolerating the length of the exam as well as difficulty breath holding.  Lower chest:  There is dense bibasilar atelectasis.  Hepatobiliary: There is no intrahepatic biliary duct dilatation. No discrete hepatic lesion on this noncontrast exam. There is loss of signal intensity on the opposed phase imaging consistent with hepatic steatosis.  There are several small gallstones layering dependently within the lumen of the gallbladder. These measure approximately 6. There is no gallbladder inflammation evident. Several stones appear to be with in the neck of the gallbladder (image 16, series 3).  The common bile duct and common hepatic duct are not dilated. There is a filling defect within the common bile duct on image 17, series 3. This is small measuring approximately 4 mm. This appears to be replicated on the heavily T2 weighted series 20 on image 3.  Pancreas: There is extensive inflammation involving the head body and tail of the pancreas suggesting acute pancreatitis. Pancreatic duct is difficult to visualize on the sequences due to respiratory motion but does not appear dilated. There is fluid along the anterior perirenal fascia left and right. Small amount fluid around the liver and spleen. No organized fluid collections are evident.  Spleen: Normal spleen.  Adrenals/urinary tract: There several renal lesions have high signal intensity T2 weighted imaging suggesting renal cysts. No hydronephrosis.  Stomach/Bowel: Stomach and limited of the small bowel is unremarkable  Vascular/Lymphatic: Abdominal aortic normal caliber. No retroperitoneal periportal lymphadenopathy.  Musculoskeletal: No aggressive osseous lesion  IMPRESSION: 1. Acute pancreatitis. No organized fluid collections. Enhancement pattern of pancreas cannot be determined. 2. Cholelithiasis without evidence of acute cholecystitis. 3. Potential small 4 mm filling defect within the mid  common bile duct. Diagnostic confidence is limited due to patient motion and breathing. No common bile duct dilatation or biliary duct dilatation. 4. Hepatic steatosis. 5. Dense bibasilar atelectasis .   Electronically Signed   By: Genevive Bi M.D.   On: 01/16/2015 08:25   Dg Chest Port 1 View  01/24/2015   CLINICAL DATA:  Respiratory failure.  EXAM: PORTABLE CHEST - 1 VIEW  COMPARISON:  01/23/2015 .  FINDINGS: Interim extubation. Right IJ line in stable position. Mediastinum and hilar structures stable. Cardiomegaly. Bilateral pulmonary alveolar infiltrates and pleural effusions. These findings are consistent with congestive heart failure. Bilateral pneumonia cannot be excluded. No pneumothorax.  IMPRESSION: 1. Interim removal of endotracheal tube. Right IJ line in stable position. 2. Persistent cardiomegaly with progressed bilateral prominent pulmonary alveolar infiltrates and pleural effusions consistent with congestive heart failure. Bilateral pneumonia cannot be excluded.   Electronically Signed   By: Maisie Fus  Register   On: 01/24/2015 07:31   Dg Chest Port 1 View  01/23/2015   CLINICAL DATA:  Respiratory failure.  EXAM: PORTABLE CHEST - 1 VIEW  COMPARISON:  01/22/2015.  FINDINGS: Endotracheal tube and  IJ line in stable position. Mediastinum and hilar structures are normal. Stable cardiomegaly and pulmonary vascularity. Persistent bibasilar pulmonary infiltrates and pleural effusions. These findings could be related congestive heart failure and/or bilateral pneumonia. No pneumothorax.  IMPRESSION: 1. Endotracheal tube and right IJ line in stable position. 2. Persistent bilateral basilar pulmonary alveolar infiltrates with small pleural effusions. These changes could be related to congestive heart failure and/or bilateral pneumonia. No significant interim change.   Electronically Signed   By: Maisie Fus  Register   On: 01/23/2015 07:10   Dg Chest Port 1 View  01/22/2015   CLINICAL DATA:  Central line  placement  EXAM: PORTABLE CHEST - 1 VIEW  COMPARISON:  Portable exam 2037 hours compared to 01/22/2015  FINDINGS: Tip of endotracheal tube projects approximately 4.1 cm above carina.  Interval removal of LEFT jugular line.  New RIGHT jugular central venous catheter with tip projecting over SVC.  Costophrenic angles excluded, repeat image declined by ordering physician.  Bibasilar effusions and atelectasis.  Upper lungs clear.  No pneumothorax.  IMPRESSION: No pneumothorax following RIGHT jugular line placement.  Persistent bibasilar effusions and atelectasis.   Electronically Signed   By: Ulyses Southward M.D.   On: 01/22/2015 21:13   Dg Chest Port 1 View  01/22/2015   CLINICAL DATA:  Shortness of breath.  Intubation.  EXAM: PORTABLE CHEST - 1 VIEW  COMPARISON:  01/21/2015.  FINDINGS: Endotracheal tube 2.1 cm above the carina. Left IJ line in stable position. Interim removal of NG tube. Stable cardiomegaly with bilateral pulmonary alveolar infiltrates and pleural effusions consistent with congestive heart failure. No pneumothorax.  IMPRESSION: 1. Endotracheal tube 2.1 cm above the carina. Left IJ line in stable position Interim removal of NG tube. 2. Persistent changes of congestive heart failure with bilateral pulmonary edema and pleural effusions.   Electronically Signed   By: Maisie Fus  Register   On: 01/22/2015 07:21   Dg Chest Port 1 View  01/21/2015   CLINICAL DATA:  ET tube placement.  EXAM: PORTABLE CHEST - 1 VIEW  COMPARISON:  01/21/2015  FINDINGS: Endotracheal tube is slightly above the carina, approximately 15 mm, directed toward the right mainstem bronchus. Left central line tip is in the SVC. No pneumothorax. Bilateral lower lobe airspace opacities. Heart is normal size. Possible small layering effusions. NG tube enters the stomach. No acute bony abnormality.  IMPRESSION: Endotracheal tube approximately 15 mm above the carina, directed toward the right mainstem bronchus. This could be retracted 1-2 cm for  optimal positioning.  Bibasilar atelectasis or infiltrates. Question small layering effusions.   Electronically Signed   By: Charlett Nose M.D.   On: 01/21/2015 11:57   Dg Chest Port 1 View  01/21/2015   CLINICAL DATA:  Lethargy, shortness of breath, epigastric pain  EXAM: PORTABLE CHEST - 1 VIEW  COMPARISON:  01/19/2015  FINDINGS: Cardiomediastinal silhouette is stable. Persistent bilateral small pleural effusion with bilateral basilar atelectasis or infiltrate. No convincing pulmonary edema.  IMPRESSION: Persistent bilateral small pleural effusion with bilateral basilar atelectasis or infiltrate. No pulmonary edema.   Electronically Signed   By: Natasha Mead M.D.   On: 01/21/2015 08:57   Dg Chest Port 1 View  01/19/2015   CLINICAL DATA:  Chest congestionPr does have a NG tube placed currently Hx of HTN, DM, cholelithiasis  EXAM: PORTABLE CHEST - 1 VIEW  COMPARISON:  01/18/2015.  FINDINGS: Bibasilar lung opacity is stable, obscuring both hemidiaphragms. This is consistent with combination of moderate pleural effusions with lung base  atelectasis, pneumonia or a combination. There is no pulmonary edema. Remainder of the lungs is clear.  No pneumothorax.  Cardiac silhouette is normal in size. No mediastinal or hilar masses.  Nasogastric tube passes below the diaphragm, stable.  IMPRESSION: 1. No significant change from the previous day's study. 2. Persistent bilateral lung base opacity consistent with a combination of moderate pleural effusions with atelectasis, pneumonia or a combination. No pulmonary edema. No pneumothorax.   Electronically Signed   By: Amie Portland M.D.   On: 01/19/2015 09:06   Dg Chest Port 1 View  01/18/2015   CLINICAL DATA:  Shortness of breath.  EXAM: PORTABLE CHEST - 1 VIEW  COMPARISON:  None.  FINDINGS: NG tube noted with tip below left hemidiaphragm. Mediastinum and hilar structures are normal. Heart size normal. Bilateral pulmonary infiltrates and pleural effusions noted. No  pneumothorax. No acute bony abnormality .  IMPRESSION: 1. NG tube noted with tip below left hemidiaphragm. 2. Bilateral prominent basilar pulmonary infiltrates. These changes could be related to pulmonary edema and/or pneumonia. Small bilateral pleural effusions.   Electronically Signed   By: Maisie Fus  Register   On: 01/18/2015 07:25   Dg Abd Portable 1v  01/23/2015   CLINICAL DATA:  Followup adynamic ileus.  EXAM: PORTABLE ABDOMEN - 1 VIEW  COMPARISON:  CT, 01/18/2015  FINDINGS: Normal bowel gas pattern. No evidence of bowel obstruction or significant adynamic ileus.  A round density projects in the right mid abdomen. This could reflect a gallstone. There other densities are upper quadrant consistent with the known gallstones.  Mild vascular calcifications.  Soft tissues otherwise unremarkable.  IMPRESSION: 1. No evidence of bowel obstruction. No evidence of a significant residual adynamic ileus. No acute finding.   Electronically Signed   By: Amie Portland M.D.   On: 01/23/2015 12:48   Dg Abd Portable 1v  01/17/2015   CLINICAL DATA:  Abdominal pain for 3 days, vomiting for 1 day  EXAM: PORTABLE ABDOMEN - 1 VIEW  COMPARISON:  01/15/2015  FINDINGS: The bowel gas pattern is normal. No radio-opaque calculi or other significant radiographic abnormality are seen. Some colonic gas is noted in right colon and mid transverse colon without significant distension  IMPRESSION: Negative.   Electronically Signed   By: Natasha Mead M.D.   On: 01/17/2015 19:51   Dg Swallowing Func-speech Pathology  01/24/2015    Objective Swallowing Evaluation:   MBS Patient Details  Name: Corbin Krampitz MRN: [id number] Date of Birth: 1941/03/29  Today's Date: 01/24/2015 Time: SLP Start Time (ACUTE ONLY): 1315-SLP Stop Time (ACUTE ONLY): 1340 SLP Time Calculation (min) (ACUTE ONLY): 25 min  Past Medical History:  Past Medical History  Diagnosis Date  . Hypertension   . Diabetes mellitus type 2 in obese   . Cholelithiasis    Past Surgical  History:  Past Surgical History  Procedure Laterality Date  . Cardiac catheterization N/A 01/17/2015    Procedure: Left Heart Cath and Coronary Angiography;  Surgeon: Lyn Records, MD;  Location: [facility] INVASIVE CV LAB;  Service: Cardiovascular;   Laterality: N/A;  . Esophagogastroduodenoscopy N/A 01/21/2015    Procedure: ESOPHAGOGASTRODUODENOSCOPY (EGD);  Surgeon: Hilarie Fredrickson, MD;   Location: [facility] ENDOSCOPY;  Service: Endoscopy;  Laterality: N/A;  to be  done at BEDSIDE   HPI:  Other Pertinent Information: 74 y/o M admitted 5/8 after a syncopal  episode and found to have acute pancreatitis. Also positive  troponin/NSTEMI. EGD shows grade 4 esophagitis with deep ulcers, eschar,  friability, blood clots in stomach. Intubated from 5/16 to 5/18. Pt  reprotedly having difficulty swallowing dinner on 5/18.   No Data Recorded  Assessment / Plan / Recommendation CHL IP CLINICAL IMPRESSIONS 01/24/2015  Therapy Diagnosis Moderate oral phase dysphagia;Moderate pharyngeal phase  dysphagia;Moderate cervical esophageal phase dysphagia  Clinical Impression MBS complete. Overt s/s of aspiration not seen during  exam this afternoon as noted on bedside eval this am.  Patient does  present with what appears to be a multifactorial dysphagia with laryngeal  and pharyngeal weakness and possible esophageal component, resulting  decreased pharyngeal squeeze, decreased relaxation of the UES, and  moderate pharyngeal residuals post swallow, greater with solids vs  liquids. Despite above however, patient fully protecting airway during  study. Should be notes that patient consuming solids and liquids via very  small boluses (less than 1/2 tsp) despite max clinician encouragement for  larger size; ? cognitive status contributing as patient also presents with  decreased sustained attention to task. Aspiration risk remains moderately  high given degree of residuals without patient awareness or ability to  clear within a reasonable time frame. Will  initiate a po diet and f/u  closely for tolerance and potential to advance.       CHL IP TREATMENT RECOMMENDATION 01/24/2015  Treatment Recommendations Therapy as outlined in treatment plan below     CHL IP DIET RECOMMENDATION 01/24/2015  SLP Diet Recommendations Dysphagia 1 (Puree);Thin  Liquid Administration via (None)  Medication Administration Crushed with puree  Compensations Small sips/bites;Slow rate;Multiple dry swallows after each  bite/sip;Follow solids with liquid  Postural Changes and/or Swallow Maneuvers (None)     CHL IP OTHER RECOMMENDATIONS 01/24/2015  Recommended Consults (None)  Oral Care Recommendations Oral care BID  Other Recommendations (None)     No flowsheet data found.   CHL IP FREQUENCY AND DURATION 01/24/2015  Speech Therapy Frequency (ACUTE ONLY) min 2x/week  Treatment Duration 1 week         SLP Swallow Goals No flowsheet data found.  No flowsheet data found.    CHL IP REASON FOR REFERRAL 01/24/2015  Reason for Referral Objectively evaluate swallowing function     Ferdinand Lango MA, CCC-SLP [phone]               McCoy Leah Meryl 01/24/2015, 1:52 PM     Labs:  CBC:  Recent Labs  01/21/15 0930 01/22/15 0449 01/23/15 0447 01/24/15 0500  WBC 33.6* 43.0* 25.2* 19.6*  HGB 6.8* 7.4* 7.4* 7.7*  HCT 19.7* 21.4* 22.0* 22.9*  PLT 143* 146* 134* 154    COAGS:  Recent Labs  01/14/15 0122 01/20/15 2220 01/21/15 0930 01/23/15 0447  INR 1.19 1.22 1.52* 1.43  APTT  --  30 28 32    BMP:  Recent Labs  01/22/15 0449 01/23/15 0447 01/23/15 2000 01/24/15 0500  NA 142 144 144 145  K 4.4 4.3 3.8 4.0  CL 117* 120* 118* 116*  CO2 18* 19* 20* 21*  GLUCOSE 165* 175* 282* 197*  BUN 71* 60* 47* 47*  CALCIUM 6.1* 6.9* 6.3* 7.1*  CREATININE 1.92* 1.89* 1.89* 2.11*  GFRNONAA 33* 34* 34* 30*  GFRAA 39* 39* 39* 34*    LIVER FUNCTION TESTS:  Recent Labs  01/21/15 0005 01/21/15 0930 01/23/15 0447 01/24/15 1400  BILITOT 1.1 1.2 1.1 1.1  AST 30 34 94* 146*  ALT 32 28 63  90*  ALKPHOS 55 50 66 62  PROT 4.2* 3.6* 4.3* 4.5*  ALBUMIN 1.6* 1.3* 1.3*  1.3*    TUMOR MARKERS: No results for input(s): AFPTM, CEA, CA199, CHROMGRNA in the last 8760 hours.  Assessment and Plan:  Grade 4  Bleeding ulcers, cannot be anticoagulated CKD stage 4 Will plan for placement of IVC filter today by Dr. Deanne Coffer.  Will use CO2 instead of contrast.  Risks and Benefits discussed with the patient including, but not limited to bleeding, infection, contrast induced renal failure, filter fracture or migration which can lead to emergency surgery or even death, strut penetration with damage or irritation to adjacent structures and caval thrombosis.  All of the patient's questions were answered, patient is agreeable to proceed. Consent signed and in chart.   Thank you for this interesting consult.  I greatly enjoyed meeting Donavin Audino and look forward to participating in their care.  SignedGolden Pop 01/24/2015, 4:36 PM   I spent a total of 20 minutes in face to face in clinical consultation, greater than 50% of which was counseling/coordinating care for IVC Filter placement

## 2015-01-24 NOTE — Progress Notes (Addendum)
PULMONARY / CRITICAL CARE MEDICINE   Name: [name] [name] MRN: [id number] DOB: 1941-03-01    ADMISSION DATE:  01/13/2015 CONSULTATION DATE:  01/21/15  REFERRING MD :  Dr. Susie Cassette  CHIEF COMPLAINT:  Pancreatitis, Hypotension   INITIAL PRESENTATION: 74 y/o M admitted 5/8 after a syncopal episode and found to have acute pancreatitis. Also positive troponin/NSTEMI    EVENTS/STUDIES:  5/08  Transfer from [facility] after syncopal episode, gallstones, pancreatitis, elevated troponin I, abnormal echocardiogram 5/09  TTE: EF 30-40%, akinesis of basal-midinferior myocardium - severe hypokinesis lateral myocardium - grade 1 DD 5/12  LHC: severe 3 vessel disease  5/13  CT ABD/Pelvis: infiltration edema around pancreas, c/w acute pancreatitis, no abscess, cholelithiasis with stones in CBD, bilateral pleural effusions with compressive atx. 5/15 transfused one unit PRBCs 5/16 Transferred to ICU with acute respiratory distress. Intubated. PCCM consultation. CXR revealed BLL consolidation 5/16 transfused one unit PRBCs 5/16 EGD: grade 4 esophagitiswith deep ulcers, eschar, friability, blood clllots in stomach, no gastric ulcers or mucosal abnormalities, mod edema of 2nd portion of duodenum c/w pancreatitis. Cont PPI infusion initiated. Heparin disconitnued. OGT left out 5/17 RASS 0. CAM-ICU negative. Received one unit PRBCs for Hgb 7.4 in setting of ACS 5/18 CXR with improved basilar aeration. Extubated 5/19 LE venous duplex scans:    INDWELLING DEVICES:: L IJ CVL 5/16 >> 5/17 L radial A-line 5/06 >> 5/18 ETT 5/16 >> 5/18 R IJ CVL 5/17 >>   MICRO DATA: MRSA PCR 5/08 >> NEG C diff 5/14 >> NEG Blood 5/16 >>  Resp 5/17 >> NOF  ANTIMICROBIALS:  Pip-tazo 5/12 >> 5/16 Vanc 5/16 >> 5/20 Imipenem 5/16 >>     SUBJECTIVE:  Very weak. Cognition intact. No overt distress  VITAL SIGNS: Temp:  [98 F (36.7 C)-99.1 F (37.3 C)] 99.1 F (37.3 C) (05/19 0332) Pulse Rate:  [83-100] 99 (05/19 1000) Resp:   [19-30] 28 (05/19 1000) BP: (87-113)/(28-60) 113/52 mmHg (05/19 1000) SpO2:  [95 %-100 %] 96 % (05/19 1000) Arterial Line BP: (111-177)/(47-97) 117/48 mmHg (05/18 1700) Weight:  [86.7 kg (191 lb 2.2 oz)] 86.7 kg (191 lb 2.2 oz) (05/19 0500)   HEMODYNAMICS:     VENTILATOR SETTINGS:     INTAKE / OUTPUT:  Intake/Output Summary (Last 24 hours) at 01/24/15 1108 Last data filed at 01/24/15 1000  Gross per 24 hour  Intake   3015 ml  Output   3475 ml  Net   -460 ml    PHYSICAL EXAMINATION: General: RASS 0, + F/C Neuro: no focal deficits, diffusely weak HEENT: WNL Cardiovascular: reg, no M Lungs: bibasilar crackles, no wheezes Abdomen:  Mild distention, NT, +BS Ext: 1-2+ symmetric edema  LABS:  CBC  Recent Labs Lab 01/22/15 0449 01/23/15 0447 01/24/15 0500  WBC 43.0* 25.2* 19.6*  HGB 7.4* 7.4* 7.7*  HCT 21.4* 22.0* 22.9*  PLT 146* 134* 154   Coag's  Recent Labs Lab 01/20/15 2220 01/21/15 0930 01/23/15 0447  APTT 30 28 32  INR 1.22 1.52* 1.43   BMET  Recent Labs Lab 01/23/15 0447 01/23/15 2000 01/24/15 0500  NA 144 144 145  K 4.3 3.8 4.0  CL 120* 118* 116*  CO2 19* 20* 21*  BUN 60* 47* 47*  CREATININE 1.89* 1.89* 2.11*  GLUCOSE 175* 282* 197*   Electrolytes  Recent Labs Lab 01/20/15 0338 01/21/15 0005  01/22/15 0449 01/23/15 0447 01/23/15 2000 01/24/15 0500  CALCIUM 6.7* 6.4*  < > 6.1* 6.9* 6.3* 7.1*  MG 2.1 1.8  --  1.7  --   --   --   PHOS 2.4* 3.3  --  3.0  --   --   --   < > = values in this interval not displayed. Sepsis Markers  Recent Labs Lab 01/21/15 0930 01/21/15 1745 01/23/15 0447  LATICACIDVEN 4.3* 1.6  --   PROCALCITON 1.12  1.33  --  3.01     ABG  Recent Labs Lab 01/21/15 0854 01/21/15 1200 01/22/15 0325  PHART 7.531* 7.409 7.440  PCO2ART 21.7* 34.3* 26.2*  PO2ART 69.4* 374.0* 189*   Liver Enzymes  Recent Labs Lab 01/21/15 0005 01/21/15 0930 01/23/15 0447  AST 30 34 94*  ALT 32 28 63  ALKPHOS 55  50 66  BILITOT 1.1 1.2 1.1  ALBUMIN 1.6* 1.3* 1.3*   Cardiac Enzymes  Recent Labs Lab 01/21/15 0930 01/21/15 1840 01/21/15 2354  TROPONINI 0.86* 3.70* 5.81*   Glucose  Recent Labs Lab 01/23/15 1213 01/23/15 1631 01/23/15 1950 01/23/15 2321 01/24/15 0331 01/24/15 0727  GLUCAP 138* 213* 256* 182* 181* 185*    CXR: edema pattern with effusions    ASSESSMENT / PLAN:  PULMONARY A: Acute respiratory failure Pulm edema Bibasilar atelectasis - improved P:   Supplemental O2 as needed to maintain SpO2 > 92% Diuresis as permitted by BP and renal function  CARDIOVASCULAR  A:  NSTEMI  Severe 3 vessel CAD Ischemic cardiomyopathy Shock, resolved PSVT, resolved Hx Htn LE edema P:  MAP goal > 65 mmHg.  Norepi 5/16 - 5/18 Cardiology following Venous duplex scans ordered 5/18  RENAL A:   CKD (baseline Cr 1.54) AKI, nonoliguric Mild NAG acidosis P:   Monitor BMET intermittently Monitor I/Os Correct electrolytes as indicated  GASTROINTESTINAL A:   Gallstone pancreatitis  Severe esophagitis Protein Calorie Malnutrition  Post extubation dysphagia P:   SUP: PPI infusion  No G tube due to esophagitis MBS per SLP today Advance diet when able  HEMATOLOGIC A:   Acute Blood Loss Anemia Mild thrombocytopenia, resolved P:  DVT px: SCDs Monitor CBC intermittently Transfuse per usual ICU guidelines noting ACS Holding all anticoagulants inc ASA  INFECTIOUS A:   Severe pancreatitis, clinically resolving Suspected HCAP P:   Micro and abx as above  ENDOCRINE A:   DM 2, now controlled P:   Resume Lantus once diet started Cont SSI, mod scale - change to ACHS after diet started  NEUROLOGIC A:   ICU associated discomfort Pain P:   RASS goal: 0 Low dose PRN fentanyl    FAMILY  - Updates: Daughter updated at bedside 5/19  Transfer to SDU. TRH to resume primary duties as of AM 5/20 and PCCM off. Discussed with Dr Chanda Busing, MD ; [facility] [phone].  After 5:30 PM or weekends, call [phone]

## 2015-01-24 NOTE — Progress Notes (Signed)
Per Dr Herbie Baltimore, discussed IVC filter placement with pt and he agreeable to proceed. Orders written for IVC to be placed by IR.  Corine Shelter PA-C 01/24/2015 3:57 PM  After discussion with Dr. Donata Clay, we realized that her LE Venous Doppler - performed today -- just were reported & show bilateral LE VenousDVTs (in small veins).  With his ongoing GI Bleed & co-morbidities, we felt that an IVC filteris the best optoin to prevent PE in the short term while he cannot be adequately anticoagulated.  Marykay Lex, M.D., M.S. Interventional Cardiologist   Pager # [phone]

## 2015-01-24 NOTE — Progress Notes (Signed)
eLink Physician-Brief Progress Note Patient Name: [name] [name] DOB: [birth year] MRN: [id number]   Date of Service  01/24/2015  HPI/Events of Note  Sugars  Hiugh.now eating  eICU Interventions  Dc d5w Normal saline at kvo     Intervention Category Intermediate Interventions: Other:  Railee Bonillas 01/24/2015, 6:43 PM

## 2015-01-24 NOTE — Evaluation (Signed)
Clinical/Bedside Swallow Evaluation Patient Details  Name: [name] [name] MRN: [id number] Date of Birth: 09-22-1940  Today's Date: 01/24/2015 Time:        Past Medical History:  Past Medical History  Diagnosis Date  . Hypertension   . Diabetes mellitus type 2 in obese   . Cholelithiasis    Past Surgical History:  Past Surgical History  Procedure Laterality Date  . Cardiac catheterization N/A 01/17/2015    Procedure: Left Heart Cath and Coronary Angiography;  Surgeon: Lyn Records, MD;  Location: [facility] INVASIVE CV LAB;  Service: Cardiovascular;  Laterality: N/A;  . Esophagogastroduodenoscopy N/A 01/21/2015    Procedure: ESOPHAGOGASTRODUODENOSCOPY (EGD);  Surgeon: Hilarie Fredrickson, MD;  Location: [facility] ENDOSCOPY;  Service: Endoscopy;  Laterality: N/A;  to be done at BEDSIDE   HPI:  74 y/o M admitted 5/8 after a syncopal episode and found to have acute pancreatitis. Also positive troponin/NSTEMI. EGD shows grade 4 esophagitis with deep ulcers, eschar, friability, blood clots in stomach.  Cleared by GI for swallow assessment and diet advancement.  Intubated from 5/16 to 5/18. Pt reprotedly having difficulty swallowing dinner on 5/18.    Assessment / Plan / Recommendation Clinical Impression  Pt presents with signs of dysphagia likely with multifactoral etiology. Immediate cough followed by expectoration of water/secretions after sips of thin liquids concerning for decreased airway protection, possible residuals or even esophageal component to dysphagia. Limiting bolus size eliminates cough. Given recent intubation, dx of esophagitis and generalized weakness, MBS warranted for objective evaluation of swallowing, planned for this pm. Pt to remain NPO until new recommendations are made.     Aspiration Risk  Moderate    Diet Recommendation NPO        Other  Recommendations Oral Care Recommendations: Oral care QID   Follow Up Recommendations       Frequency and Duration        Pertinent  Vitals/Pain NA    SLP Swallow Goals     Swallow Study Prior Functional Status       General Other Pertinent Information: 74 y/o M admitted 5/8 after a syncopal episode and found to have acute pancreatitis. Also positive troponin/NSTEMI. EGD shows grade 4 esophagitis with deep ulcers, eschar, friability, blood clots in stomach. Intubated from 5/16 to 5/18. Pt reprotedly having difficulty swallowing dinner on 5/18.  Type of Study: Bedside swallow evaluation Previous Swallow Assessment: none Diet Prior to this Study: NPO Temperature Spikes Noted: No Respiratory Status: Room air History of Recent Intubation: Yes Length of Intubations (days): 3 days Date extubated: 01/23/15 Behavior/Cognition: Alert;Cooperative;Requires cueing;Confused Oral Cavity - Dentition: Adequate natural dentition/normal for age Self-Feeding Abilities: Able to feed self;Needs assist Patient Positioning: Upright in bed Baseline Vocal Quality: Normal Volitional Cough: Congested Volitional Swallow: Able to elicit    Oral/Motor/Sensory Function Overall Oral Motor/Sensory Function: Other (comment) (lingual movement weak and tremulous)   Ice Chips     Thin Liquid Thin Liquid: Impaired Presentation: Cup;Straw;Self Fed Pharyngeal  Phase Impairments: Suspected delayed Swallow;Decreased hyoid-laryngeal movement;Cough - Immediate;Other (comments) (expectorates liquid)    Nectar Thick     Honey Thick     Puree Puree: Within functional limits (but only taking minimal tastes)   Solid   GO    Solid: Not tested      Harlon Ditty, MA CCC-SLP [phone]  Claudine Mouton 01/24/2015,8:32 AM

## 2015-01-25 ENCOUNTER — Inpatient Hospital Stay (HOSPITAL_COMMUNITY): Payer: Medicare Other

## 2015-01-25 DIAGNOSIS — I82403 Acute embolism and thrombosis of unspecified deep veins of lower extremity, bilateral: Secondary | ICD-10-CM

## 2015-01-25 DIAGNOSIS — I959 Hypotension, unspecified: Secondary | ICD-10-CM

## 2015-01-25 DIAGNOSIS — I2510 Atherosclerotic heart disease of native coronary artery without angina pectoris: Secondary | ICD-10-CM

## 2015-01-25 LAB — TYPE AND SCREEN
ABO/RH(D): A POS
Antibody Screen: NEGATIVE
UNIT DIVISION: 0
UNIT DIVISION: 0
Unit division: 0
Unit division: 0
Unit division: 0

## 2015-01-25 LAB — HEPATIC FUNCTION PANEL
ALBUMIN: 1.4 g/dL — AB (ref 3.5–5.0)
ALK PHOS: 72 U/L (ref 38–126)
ALT: 96 U/L — ABNORMAL HIGH (ref 17–63)
AST: 149 U/L — AB (ref 15–41)
BILIRUBIN TOTAL: 1.7 mg/dL — AB (ref 0.3–1.2)
Bilirubin, Direct: 0.4 mg/dL (ref 0.1–0.5)
Indirect Bilirubin: 1.3 mg/dL — ABNORMAL HIGH (ref 0.3–0.9)
TOTAL PROTEIN: 5.1 g/dL — AB (ref 6.5–8.1)

## 2015-01-25 LAB — GLUCOSE, CAPILLARY
GLUCOSE-CAPILLARY: 199 mg/dL — AB (ref 65–99)
GLUCOSE-CAPILLARY: 171 mg/dL — AB (ref 65–99)
Glucose-Capillary: 162 mg/dL — ABNORMAL HIGH (ref 65–99)
Glucose-Capillary: 171 mg/dL — ABNORMAL HIGH (ref 65–99)
Glucose-Capillary: 202 mg/dL — ABNORMAL HIGH (ref 65–99)
Glucose-Capillary: 220 mg/dL — ABNORMAL HIGH (ref 65–99)
Glucose-Capillary: 248 mg/dL — ABNORMAL HIGH (ref 65–99)
Glucose-Capillary: 252 mg/dL — ABNORMAL HIGH (ref 65–99)

## 2015-01-25 LAB — CBC
HCT: 27.1 % — ABNORMAL LOW (ref 39.0–52.0)
Hemoglobin: 9.1 g/dL — ABNORMAL LOW (ref 13.0–17.0)
MCH: 29.6 pg (ref 26.0–34.0)
MCHC: 33.6 g/dL (ref 30.0–36.0)
MCV: 88.3 fL (ref 78.0–100.0)
PLATELETS: 171 10*3/uL (ref 150–400)
RBC: 3.07 MIL/uL — ABNORMAL LOW (ref 4.22–5.81)
RDW: 15.5 % (ref 11.5–15.5)
WBC: 18.2 10*3/uL — AB (ref 4.0–10.5)

## 2015-01-25 LAB — BASIC METABOLIC PANEL
Anion gap: 6 (ref 5–15)
BUN: 38 mg/dL — ABNORMAL HIGH (ref 6–20)
CALCIUM: 7.1 mg/dL — AB (ref 8.9–10.3)
CHLORIDE: 117 mmol/L — AB (ref 101–111)
CO2: 22 mmol/L (ref 22–32)
Creatinine, Ser: 2.06 mg/dL — ABNORMAL HIGH (ref 0.61–1.24)
GFR calc non Af Amer: 31 mL/min — ABNORMAL LOW (ref >60)
GFR, EST AFRICAN AMERICAN: 35 mL/min — AB (ref >60)
Glucose, Bld: 181 mg/dL — ABNORMAL HIGH (ref 65–99)
Potassium: 3.9 mmol/L (ref 3.5–5.1)
Sodium: 145 mmol/L (ref 135–145)

## 2015-01-25 LAB — LIPASE, BLOOD: Lipase: 16 U/L — ABNORMAL LOW (ref 22–51)

## 2015-01-25 LAB — PROCALCITONIN: Procalcitonin: 1.4 ng/mL

## 2015-01-25 MED ORDER — FUROSEMIDE 10 MG/ML IJ SOLN
40.0000 mg | Freq: Once | INTRAMUSCULAR | Status: AC
Start: 2015-01-25 — End: 2015-01-25
  Administered 2015-01-25: 40 mg via INTRAVENOUS

## 2015-01-25 NOTE — Progress Notes (Signed)
[name] TEAM 1 - Stepdown/ICU TEAM Progress Note  [name] [name] ERD:[id number] DOB: 1941-02-09 DOA: 01/13/2015 PCP: [name] Blitz, MD  Admit HPI / Brief Narrative: 74 yo WM PMHx HTN and DM Type 2 Presented to [facility] ED after a syncopal episode. He had been having persistent epigastric abdominal pain with associated nausea. The pain progressively got worse, and up to an 8/10 when he passed out. EMS noted he was bradycardic in the 30s with SBP documented in the mid 60s.He had previously been told he has a gallbladder full of stones but had been asymptomatic w/ no h/o pancreatitis.   In ED at [place] showed CBD stone and pancreatitis. Lip was >300 (no actual level). AST/ALT 247/212 - tbili 1.9 - alk phos normal. Lactic acid level 2.2 . Creat 1.7. The pt was transferred to [facility].    HPI/Subjective: 5/20 A/O 4, states negative abdominal pain, negative N/V, negative CP, negative SOB, positive continued Fatigue  Assessment/Plan: Acute Gallstone pancreatitis  -Plan was initially for ERCP, however secondary to patient's possible NSTEMI will proceed today with MRCP instead. -KUB shows possible small ileus : Resolving, patient able to take  PO nutrition without significant abdominal pain, N/V.  Leukocytosis -Still elevated but trending down   Syncope and collapse -Most likely multifactorial to include vagal, bradycardia, NSTEMI,and systolic CHF  -Continue management per cardiology  Bradycardia  -See syncope and collapse  NSTEMI/Severe 3 vessel CAD/Ischemic cardiomyopathy -Per cardiology attempting to stabilize patient for CABG;   -Strict in and out + 2.8 L -Daily weight  Acute systolic congestive heart failure -Medications per cardiology  HTN/Hypotension  -Resolved on no medication   Acute kidney injury?  vs   chronic kidney failure -Unable to determine patient's baseline kidney function through review of EMR.  -Judicious volume resuscitation; Cr trending  up.  DM Type 2 uncontrolled -5/8 hemoglobin A1c = 7.5  -Reasonably well controlled at the present time   Code Status: FULL Family Communication:  No family present at time of exam Disposition Plan: - timing of ERCP per GI - timing of cardiac cath per cardiology    Consultants: Dr.Peter Prescott Gum (cardiothoracic surgery) Dr.John Delice Lesch (GI) Dr.David Loren Racer (Cardiology)   Procedure/Significant Events: TTE - 5/9 - EF 30-40% - akinesis of basal-midinferior myocardium - severe hypokinesis lateral myocardium - grade 1 DD 5/10 KUB;Mild dilatation of small bowel likely representing a small-bowel ileus. 5/12 cardiac catheterization; Severe native vessel coronary disease with total occlusion of the RCA/proximal circumflex,-severe proximal LAD disease. -Dominant diagonal contains segmental 50% narrowing. -left ventricle significant systolic impairment; -HUDJ=49% with inferior wall severe hypokinesis.  Culture   Antibiotics: Vancomycin 5/16>> stopped 5/19 Primaxin 5/17>>  DVT prophylaxis: IV heparin    Devices    LINES / TUBES:      Continuous Infusions: . sodium chloride Stopped (01/22/15 1700)    Objective: VITAL SIGNS: Temp: 97.6 F (36.4 C) (05/21 0400) Temp Source: Oral (05/21 0400) BP: 129/46 mmHg (05/21 0400) Pulse Rate: 86 (05/21 0400) SPO2; FIO2:   Intake/Output Summary (Last 24 hours) at 01/26/15 0750 Last data filed at 01/26/15 0400  Gross per 24 hour  Intake    680 ml  Output   3290 ml  Net  -2610 ml     Exam: General: A/O 4, NAD, No acute respiratory distress Eyes: Negative eye pain, double vision, scotomas, floaters, negative retinal hemorrhage ENT: Negative Runny nose, negative ear pain, negative tinnitus,  Neck:  Negative scars, masses, torticollis, lymphadenopathy, JVD Skin ; negative rash,  lesions, wounds, right IJ site clean and covered negative sign of infection  Lungs: Clear to auscultation bilaterally without wheezes or  crackles Cardiovascular: Regular rate and rhythm without murmur gallop or rub normal S1 and S2 Abdomen: Mild generalized tenderness, nondistended, soft, bowel sounds positive, no rebound, no ascites, no appreciable mass Extremities: No significant cyanosis, clubbing, or edema bilateral lower extremities Psychiatric:  Negative depression, negative anxiety, negative fatigue, negative mania  Neurologic:  Cranial nerves II through XII intact, negative dysarthria, negative expressive aphasia, negative receptive aphasia.     Data Reviewed: Basic Metabolic Panel:  Recent Labs Lab 01/20/15 0338 01/21/15 0005  01/22/15 0449 01/23/15 0447 01/23/15 2000 01/24/15 0500 01/25/15 0440 01/26/15 0410  NA 143 138  < > 142 144 144 145 145 148*  K 2.7* 3.2*  < > 4.4 4.3 3.8 4.0 3.9 3.6  CL 104 106  < > 117* 120* 118* 116* 117* 116*  CO2 28 24  < > 18* 19* 20* 21* 22 24  GLUCOSE 246* 303*  < > 165* 175* 282* 197* 181* 239*  BUN 46* 60*  < > 71* 60* 47* 47* 38* 31*  CREATININE 1.61* 1.71*  < > 1.92* 1.89* 1.89* 2.11* 2.06* 1.85*  CALCIUM 6.7* 6.4*  < > 6.1* 6.9* 6.3* 7.1* 7.1* 6.9*  MG 2.1 1.8  --  1.7  --   --   --   --   --   PHOS 2.4* 3.3  --  3.0  --   --   --   --   --   < > = values in this interval not displayed. Liver Function Tests:  Recent Labs Lab 01/21/15 0930 01/23/15 0447 01/24/15 1400 01/25/15 0440 01/26/15 0410  AST 34 94* 146* 149* 149*  ALT 28 63 90* 96* 104*  ALKPHOS 50 66 62 72 85  BILITOT 1.2 1.1 1.1 1.7* 1.5*  PROT 3.6* 4.3* 4.5* 5.1* 4.9*  ALBUMIN 1.3* 1.3* 1.3* 1.4* 1.3*    Recent Labs Lab 01/21/15 0930 01/24/15 1400 01/25/15 0440  LIPASE 22 13* 16*  AMYLASE  --  45  --    No results for input(s): AMMONIA in the last 168 hours. CBC:  Recent Labs Lab 01/21/15 0930 01/22/15 0449 01/23/15 0447 01/24/15 0500 01/24/15 1832 01/25/15 0440 01/26/15 0410  WBC 33.6* 43.0* 25.2* 19.6*  --  18.2* 16.2*  NEUTROABS 29.2*  --   --   --   --   --   --   HGB  6.8* 7.4* 7.4* 7.7* 9.4* 9.1* 9.4*  HCT 19.7* 21.4* 22.0* 22.9* 27.4* 27.1* 27.9*  MCV 89.5 87.3 88.7 88.4  --  88.3 89.7  PLT 143* 146* 134* 154  --  171 178   Cardiac Enzymes:  Recent Labs Lab 01/21/15 0930 01/21/15 1840 01/21/15 2354  TROPONINI 0.86* 3.70* 5.81*   BNP (last 3 results) No results for input(s): BNP in the last 8760 hours.  ProBNP (last 3 results) No results for input(s): PROBNP in the last 8760 hours.  CBG:  Recent Labs Lab 01/25/15 1200 01/25/15 1636 01/25/15 1916 01/25/15 2328 01/26/15 0404  GLUCAP 171* 220* 248* 252* 219*    Recent Results (from the past 240 hour(s))  Clostridium Difficile by PCR     Status: None   Collection Time: 01/19/15  3:12 PM  Result Value Ref Range Status   C difficile by pcr NEGATIVE NEGATIVE Final  Culture, blood (routine x 2)     Status: None (Preliminary result)  Collection Time: 01/21/15  9:30 AM  Result Value Ref Range Status   Specimen Description BLOOD RIGHT ANTECUBITAL  Final   Special Requests Immunocompromised BAA 10CC EACH  Final   Culture   Final           BLOOD CULTURE RECEIVED NO GROWTH TO DATE CULTURE WILL BE HELD FOR 5 DAYS BEFORE ISSUING A FINAL NEGATIVE REPORT Performed at [facility]    Report Status PENDING  Incomplete  Culture, blood (routine x 2)     Status: None (Preliminary result)   Collection Time: 01/21/15  9:40 AM  Result Value Ref Range Status   Specimen Description BLOOD RIGHT ARM  Final   Special Requests Immunocompromised AEB 5CC  Final   Culture   Final           BLOOD CULTURE RECEIVED NO GROWTH TO DATE CULTURE WILL BE HELD FOR 5 DAYS BEFORE ISSUING A FINAL NEGATIVE REPORT Performed at [facility]    Report Status PENDING  Incomplete  Culture, respiratory (NON-Expectorated)     Status: None   Collection Time: 01/22/15 12:20 PM  Result Value Ref Range Status   Specimen Description TRACHEAL ASPIRATE  Final   Special Requests NONE  Final   Gram Stain   Final     FEW WBC PRESENT,BOTH PMN AND MONONUCLEAR NO SQUAMOUS EPITHELIAL CELLS SEEN NO ORGANISMS SEEN Performed at [facility]    Culture   Final    Non-Pathogenic Oropharyngeal-type Flora Isolated. Performed at [facility]    Report Status 01/24/2015 FINAL  Final     Studies:  Recent x-ray studies have been reviewed in detail by the Attending Physician  Scheduled Meds:  Scheduled Meds: . sodium chloride   Intravenous Once  . feeding supplement (RESOURCE BREEZE)  1 Container Oral TID BM  . imipenem-cilastatin  250 mg Intravenous 4 times per day  . insulin aspart  0-15 Units Subcutaneous 6 times per day  . latanoprost  1 drop Both Eyes QHS  . metoCLOPramide (REGLAN) injection  5 mg Intravenous 3 times per day  . pantoprazole (PROTONIX) IV  40 mg Intravenous Q12H  . ranolazine  500 mg Oral BID    Time spent on care of this patient: 40 mins   WOODS, Geraldo Docker , MD  [facility] Office  [phone] Pager - [phone]  On-Call/Text Page:      [email or web address]      password [email or web address]  If 7PM-7AM, please contact night-coverage [email or web address] Password [email or web address] 01/26/2015, 7:50 AM   LOS: 13 days   Care during the described time interval was provided by me .  I have reviewed this patient's available data, including medical history, events of note, physical examination, and all test results as part of my evaluation. I have personally reviewed and interpreted all radiology studies. Dia Crawford, MD [phone] Pager

## 2015-01-25 NOTE — Progress Notes (Signed)
Speech Language Pathology Treatment: Dysphagia  Patient Details Name: [name] [name] MRN: [id number] DOB: Nov 07, 1940 Today's Date: 01/25/2015 Time: 8309-4076 SLP Time Calculation (min) (ACUTE ONLY): 9 min  Assessment / Plan / Recommendation Clinical Impression  F/U after yesterday's MBS.  Pt remains on clear liquid diet - tolerating well today per RN and per this clinician's observation.  No overt s/s of aspiration.  Voice with improving quality.  Min cues overall for precautions.  SLP will follow.     HPI Other Pertinent Information: 74 y/o M admitted 5/8 after a syncopal episode and found to have acute pancreatitis. Also positive troponin/NSTEMI. EGD shows grade 4 esophagitis with deep ulcers, eschar, friability, blood clots in stomach. Intubated from 5/16 to 5/18. Pt reprotedly having difficulty swallowing dinner on 5/18.    Pertinent Vitals Pain Assessment: No/denies pain  SLP Plan       Recommendations Diet recommendations: Thin liquid Liquids provided via: Cup;Straw Medication Administration: Crushed with puree Compensations: Small sips/bites;Slow rate;Multiple dry swallows after each bite/sip;Follow solids with liquid              Oral Care Recommendations: Oral care BID Follow up Recommendations:  (tba)    GO     Blenda Mounts Laurice 01/25/2015, 4:03 PM

## 2015-01-25 NOTE — Progress Notes (Signed)
Subjective: No new complaints, about to have breakfast, tolerating a clear liquid diet. Bowel movement last night, dark with hardly any blood. Was also transfused a unit of blood last night. Pt says he is no longer having chest pains. Had IVC filter placed yesterday.  Objective: Vital signs in last 24 hours: Filed Vitals:   01/25/15 0300 01/25/15 0400 01/25/15 0500 01/25/15 0600  BP: 93/71 108/55 116/43 100/27  Pulse: 88 84 79 77  Temp:  98.2 F (36.8 C)    TempSrc:  Oral    Resp: 27 32 24 23  Height:      Weight:  187 lb 13.3 oz (85.2 kg)    SpO2: 99% 94% 99% 97%   Weight change: -3 lb 4.9 oz (-1.5 kg)  Intake/Output Summary (Last 24 hours) at 01/25/15 0749 Last data filed at 01/25/15 0600  Gross per 24 hour  Intake 1479.17 ml  Output   3255 ml  Net -1775.83 ml   General appearance: Alert, lying in bed, not in any distress Head: Normocephalic, without obvious abnormality, atraumatic Lungs: Coarse breath sounds, but no added sounds appreciated   Heart: S1 S2, no murmurs, rubs or gallops.  Abdomen: Slightly firm, not tender, reduced bowel sounds, mild tenderness epigastric region. Extremities: Left lower extremity pitting edema, shiny, no redness or tenderness on palpation.  Tele- PVCs, one episode of Non susained VT.  Lab Results: Basic Metabolic Panel:  Recent Labs Lab 01/21/15 0005  01/22/15 0449  01/24/15 0500 01/25/15 0440  NA 138  < > 142  < > 145 145  K 3.2*  < > 4.4  < > 4.0 3.9  CL 106  < > 117*  < > 116* 117*  CO2 24  < > 18*  < > 21* 22  GLUCOSE 303*  < > 165*  < > 197* 181*  BUN 60*  < > 71*  < > 47* 38*  CREATININE 1.71*  < > 1.92*  < > 2.11* 2.06*  CALCIUM 6.4*  < > 6.1*  < > 7.1* 7.1*  MG 1.8  --  1.7  --   --   --   PHOS 3.3  --  3.0  --   --   --   < > = values in this interval not displayed. Liver Function Tests:  Recent Labs Lab 01/24/15 1400 01/25/15 0440  AST 146* 149*  ALT 90* 96*  ALKPHOS 62 72  BILITOT 1.1 1.7*  PROT 4.5*  5.1*  ALBUMIN 1.3* 1.4*    Recent Labs Lab 01/24/15 1400 01/25/15 0440  LIPASE 13* 16*  AMYLASE 45  --    CBC:  Recent Labs Lab 01/19/15 0459  01/21/15 0930  01/24/15 0500 01/24/15 1832 01/25/15 0440  WBC 16.0*  < > 33.6*  < > 19.6*  --  18.2*  NEUTROABS 13.8*  --  29.2*  --   --   --   --   HGB 14.9  < > 6.8*  < > 7.7* 9.4* 9.1*  HCT 43.2  < > 19.7*  < > 22.9* 27.4* 27.1*  MCV 89.1  < > 89.5  < > 88.4  --  88.3  PLT 151  < > 143*  < > 154  --  171  < > = values in this interval not displayed. Cardiac Enzymes:  Recent Labs Lab 01/21/15 0930 01/21/15 1840 01/21/15 2354  TROPONINI 0.86* 3.70* 5.81*   CBG:  Recent Labs Lab 01/24/15 0727 01/24/15 1154 01/24/15  1834 01/24/15 2032 01/25/15 0043 01/25/15 0437  GLUCAP 185* 175* 233* 199* 202* 171*   Fasting Lipid Panel:  Recent Labs Lab 01/19/15 0459  TRIG 195*   Coagulation:  Recent Labs Lab 01/20/15 2220 01/21/15 0930 01/23/15 0447  LABPROT 15.5* 18.5* 17.6*  INR 1.22 1.52* 1.43   Micro Results: Recent Results (from the past 240 hour(s))  Clostridium Difficile by PCR     Status: None   Collection Time: 01/19/15  3:12 PM  Result Value Ref Range Status   C difficile by pcr NEGATIVE NEGATIVE Final  Culture, blood (routine x 2)     Status: None (Preliminary result)   Collection Time: 01/21/15  9:30 AM  Result Value Ref Range Status   Specimen Description BLOOD RIGHT ANTECUBITAL  Final   Special Requests Immunocompromised BAA 10CC EACH  Final   Culture   Final           BLOOD CULTURE RECEIVED NO GROWTH TO DATE CULTURE WILL BE HELD FOR 5 DAYS BEFORE ISSUING A FINAL NEGATIVE REPORT Performed at [facility]    Report Status PENDING  Incomplete  Culture, blood (routine x 2)     Status: None (Preliminary result)   Collection Time: 01/21/15  9:40 AM  Result Value Ref Range Status   Specimen Description BLOOD RIGHT ARM  Final   Special Requests Immunocompromised AEB 5CC  Final   Culture    Final           BLOOD CULTURE RECEIVED NO GROWTH TO DATE CULTURE WILL BE HELD FOR 5 DAYS BEFORE ISSUING A FINAL NEGATIVE REPORT Performed at [facility]    Report Status PENDING  Incomplete  Culture, respiratory (NON-Expectorated)     Status: None   Collection Time: 01/22/15 12:20 PM  Result Value Ref Range Status   Specimen Description TRACHEAL ASPIRATE  Final   Special Requests NONE  Final   Gram Stain   Final    FEW WBC PRESENT,BOTH PMN AND MONONUCLEAR NO SQUAMOUS EPITHELIAL CELLS SEEN NO ORGANISMS SEEN Performed at [facility]    Culture   Final    Non-Pathogenic Oropharyngeal-type Flora Isolated. Performed at [facility]    Report Status 01/24/2015 FINAL  Final   Studies/Results: Ir Ivc Filter Plmt / S&i /img Guid/mod Sed  01/24/2015   CLINICAL DATA:  Acute DVT. GI bleed, a relative contraindication to anticoagulation. Caval filtration is requested. Renal insufficiency. Indwelling right IJ triple-lumen catheter.  EXAM: INFERIOR VENACAVOGRAM  IVC FILTER PLACEMENT UNDER FLUOROSCOPY  FLUOROSCOPY TIME:  30 seconds, 35.6 mGy  TECHNIQUE: Patency of the left IJ vein was confirmed with ultrasound with image documentation. An appropriate skin site was determined. Skin site was marked, prepped with chlorhexidine, and draped using maximum barrier technique. The region was infiltrated locally with 1% lidocaine.  Intravenous Fentanyl and Versed were administered as conscious sedation during continuous cardiorespiratory monitoring by the radiology RN, with a total moderate sedation time of less than 30 minutes.  Under real-time ultrasound guidance, the left IJ vein was accessed with a 21 gauge micropuncture needle; the needle tip within the vein was confirmed with ultrasound image documentation. The needle was exchanged over a 018 guidewire for a transitional dilator, which allow advancement of the [facility] wire into the IVC. A long 6 French vascular sheath was placed for  inferior venacavography using CO2. This demonstrated no caval thrombus. Renal vein inflows were evident.  The [facility] IVC filter was advanced through the sheath and successfully deployed  under fluoroscopy at the L2 level. Followup CO2 cavagram demonstrates stable filter position and no evident complication. The sheath was removed and hemostasis achieved at the site. No immediate complication.  IMPRESSION: 1. Normal IVC. No thrombus or significant anatomic variation. 2. Technically successful infrarenal IVC filter placement. This is a retrievable model.   Electronically Signed   By: Corlis Leak M.D.   On: 01/24/2015 17:44   Dg Chest Port 1 View  01/25/2015   CLINICAL DATA:  Respiratory failure.  EXAM: PORTABLE CHEST - 1 VIEW  COMPARISON:  01/24/2015 .  FINDINGS: Right IJ line stable position. Stable cardiomegaly. Diffuse pulmonary alveolar infiltrates in pleural effusions again noted. No interim change . No pneumothorax. No acute bony abnormality P  IMPRESSION: 1. Right IJ line stable position. 2. Persistent cardiomegaly with bilateral pulmonary alveolar infiltrates and pleural effusions consistent with congestive heart failure. No interim change. Superimposed pneumonia cannot be excluded.   Electronically Signed   By: Maisie Fus  Register   On: 01/25/2015 07:22   Dg Chest Port 1 View  01/24/2015   CLINICAL DATA:  Respiratory failure.  EXAM: PORTABLE CHEST - 1 VIEW  COMPARISON:  01/23/2015 .  FINDINGS: Interim extubation. Right IJ line in stable position. Mediastinum and hilar structures stable. Cardiomegaly. Bilateral pulmonary alveolar infiltrates and pleural effusions. These findings are consistent with congestive heart failure. Bilateral pneumonia cannot be excluded. No pneumothorax.  IMPRESSION: 1. Interim removal of endotracheal tube. Right IJ line in stable position. 2. Persistent cardiomegaly with progressed bilateral prominent pulmonary alveolar infiltrates and pleural effusions consistent with congestive  heart failure. Bilateral pneumonia cannot be excluded.   Electronically Signed   By: Maisie Fus  Register   On: 01/24/2015 07:31   Dg Abd Portable 1v  01/23/2015   CLINICAL DATA:  Followup adynamic ileus.  EXAM: PORTABLE ABDOMEN - 1 VIEW  COMPARISON:  CT, 01/18/2015  FINDINGS: Normal bowel gas pattern. No evidence of bowel obstruction or significant adynamic ileus.  A round density projects in the right mid abdomen. This could reflect a gallstone. There other densities are upper quadrant consistent with the known gallstones.  Mild vascular calcifications.  Soft tissues otherwise unremarkable.  IMPRESSION: 1. No evidence of bowel obstruction. No evidence of a significant residual adynamic ileus. No acute finding.   Electronically Signed   By: Amie [place] M.D.   On: 01/23/2015 12:48   Dg Swallowing Func-speech Pathology  01/24/2015    Objective Swallowing Evaluation:   MBS Patient Details  Name: [name] [name] MRN: [id number] Date of Birth: December 20, 1940  Today's Date: 01/24/2015 Time: SLP Start Time (ACUTE ONLY): 1315-SLP Stop Time (ACUTE ONLY): 1340 SLP Time Calculation (min) (ACUTE ONLY): 25 min  Past Medical History:  Past Medical History  Diagnosis Date  . Hypertension   . Diabetes mellitus type 2 in obese   . Cholelithiasis    Past Surgical History:  Past Surgical History  Procedure Laterality Date  . Cardiac catheterization N/A 01/17/2015    Procedure: Left Heart Cath and Coronary Angiography;  Surgeon: Lyn Records, MD;  Location: [facility] INVASIVE CV LAB;  Service: Cardiovascular;   Laterality: N/A;  . Esophagogastroduodenoscopy N/A 01/21/2015    Procedure: ESOPHAGOGASTRODUODENOSCOPY (EGD);  Surgeon: Hilarie Fredrickson, MD;   Location: [facility] ENDOSCOPY;  Service: Endoscopy;  Laterality: N/A;  to be  done at BEDSIDE   HPI:  Other Pertinent Information: 74 y/o M admitted 5/8 after a syncopal  episode and found to have acute pancreatitis. Also positive  troponin/NSTEMI. EGD shows grade  4 esophagitis with deep ulcers, eschar,   friability, blood clots in stomach. Intubated from 5/16 to 5/18. Pt  reprotedly having difficulty swallowing dinner on 5/18.   No Data Recorded  Assessment / Plan / Recommendation CHL IP CLINICAL IMPRESSIONS 01/24/2015  Therapy Diagnosis Moderate oral phase dysphagia;Moderate pharyngeal phase  dysphagia;Moderate cervical esophageal phase dysphagia  Clinical Impression MBS complete. Overt s/s of aspiration not seen during  exam this afternoon as noted on bedside eval this am.  Patient does  present with what appears to be a multifactorial dysphagia with laryngeal  and pharyngeal weakness and possible esophageal component, resulting  decreased pharyngeal squeeze, decreased relaxation of the UES, and  moderate pharyngeal residuals post swallow, greater with solids vs  liquids. Despite above however, patient fully protecting airway during  study. Should be notes that patient consuming solids and liquids via very  small boluses (less than 1/2 tsp) despite max clinician encouragement for  larger size; ? cognitive status contributing as patient also presents with  decreased sustained attention to task. Aspiration risk remains moderately  high given degree of residuals without patient awareness or ability to  clear within a reasonable time frame. Will initiate a po diet and f/u  closely for tolerance and potential to advance.       CHL IP TREATMENT RECOMMENDATION 01/24/2015  Treatment Recommendations Therapy as outlined in treatment plan below     CHL IP DIET RECOMMENDATION 01/24/2015  SLP Diet Recommendations Dysphagia 1 (Puree);Thin  Liquid Administration via (None)  Medication Administration Crushed with puree  Compensations Small sips/bites;Slow rate;Multiple dry swallows after each  bite/sip;Follow solids with liquid  Postural Changes and/or Swallow Maneuvers (None)     CHL IP OTHER RECOMMENDATIONS 01/24/2015  Recommended Consults (None)  Oral Care Recommendations Oral care BID  Other Recommendations (None)     No  flowsheet data found.   CHL IP FREQUENCY AND DURATION 01/24/2015  Speech Therapy Frequency (ACUTE ONLY) min 2x/week  Treatment Duration 1 week         SLP Swallow Goals No flowsheet data found.  No flowsheet data found.    CHL IP REASON FOR REFERRAL 01/24/2015  Reason for Referral Objectively evaluate swallowing function     Ferdinand Lango MA, CCC-SLP [phone]               Ferdinand Lango Meryl 01/24/2015, 1:52 PM    Medications: I have reviewed the patient's current medications. Scheduled Meds: . sodium chloride   Intravenous Once  . feeding supplement (RESOURCE BREEZE)  1 Container Oral TID BM  . imipenem-cilastatin  250 mg Intravenous 4 times per day  . insulin aspart  0-15 Units Subcutaneous 6 times per day  . latanoprost  1 drop Both Eyes QHS  . metoCLOPramide (REGLAN) injection  5 mg Intravenous 3 times per day  . pantoprazole (PROTONIX) IV  40 mg Intravenous Q12H  . ranolazine  500 mg Oral BID   Continuous Infusions: . sodium chloride Stopped (01/22/15 1700)   PRN Meds:.acetaminophen, acetaminophen, bisacodyl, fentaNYL (SUBLIMAZE) injection, ondansetron (ZOFRAN) IV Assessment/Plan: Principal Problem:   Acute gallstone pancreatitis Active Problems:   Abdominal pain, epigastric   Nausea with vomiting   Elevated LFTs   Gallstones   Syncope   Hypotension   Bradycardia   AKI (acute kidney injury)   Diabetes   Pancreatitis   NSTEMI (non-ST elevated myocardial infarction)   Cardiomyopathy   Choledocholithiasis   Hypocalcemia   Ileus   Acute biliary pancreatitis   Leukocytosis   Syncope  and collapse   Acute systolic congestive heart failure   Essential hypertension   Other specified hypotension   Acute kidney injury   Diabetes type 2, uncontrolled   CAD, multiple vessel: CTO of RCA, Cx with severe prox LAD disease   Calculus of gallbladder with acute cholecystitis without obstruction   Cholecystitis, acute   Metabolic acidosis   Acute systolic CHF (congestive heart failure)    Acute renal failure syndrome   Acute blood loss anemia   Hemorrhagic shock   Acute respiratory failure with hypoxemia   Septic shock   Ulcerative esophagitis   Hematemesis with nausea   Melena   Demand ischemia of myocardium   Erosive esophagitis   HCAP (healthcare-associated pneumonia)   Shock   DVT (deep venous thrombosis)  Hemorrhagic Shock/Acute Blood Loss Anemia- resolving, S/p 5 units of PRBC. Got another unit last night. Hgb- 9.1 this am. Off pressors, off PPi infusion. EGD done - Erosive  Esophagitis. Dark stools. - Goal Hgb >8 - Transfer to Step down, but remain in 2H step down. - Now on BID IV protonix 40mg . - Talked to GI about adding Carafate- they would not recommend it, as it would affect absorption of other medications he will be taking PO and that as he is already on BID PPI, there wont be much additional benefit to adding Carafate.   CAD- Milti-vessel dx on Cath- 01/17/2015, planned for CABG when acute issues resolve. Persistent -chest pain. Anemia likely provoking angina, also with persistent ST depressions. --Notably improved with Ranexa - CVTS Recs appreciated - Hold antiplatelet and IV heparin for now. - Conservative management for now, considering significant Co-morbities. - Cont Ranolaxine - Limited activity in bedroom- With significant CAD, Hypotension and anemia.  CARDIOMYOPATHY: EF 30 - 40%.No Beta blocker and hydralazine for now- hypotension. Chest xray with Pulm infiltrates likely due to edema, also with pleural effusion.  - On ranolazine - once blood pressure starts to stabilize we will need to begin to titrate back on beta blocker plus or minus nitrate. - Will give one dose of IV Lasix  DVT- Has not been on DVT ppx, due to acute severe GI bleed, so far requiring 5units of blood, and still cannot be on any form of anticoag or antiplatelets. Interventional radiology consulted. s/p IVC filter placement.   Acute Pancreatitis with Acute calculous  cholecystitis- Per GI. Improving leukocytosis. Slight bump but overall improvement in Ast and Alt, but lipase and amylase now normal. Mild pain on palpation of abdomen. - Await and appreciate CVTs recs  AKI- Stable at 2.06 today >> 2.11. Peak 2.28- 01/21/2015- likely due to ATN, and diuretics, Making urine- out- 3.2L  Yesterday, and net neg also yesterday- .8L. Net positive 4L since admission.  - Bmet Am. - IV lasix today 40mg  once  Erosive Esophagitis- bleeding appears to have significantly slowed/stopped, as Hgb is stable, no obvious bleeding per rectum. Now on clear liq diet.  - Now ON PPI IV BID - CBC am - Talked to GI about adding Carafate- they would not recommend it, details per above.   Diabetes- On SSI Q4H.  Per primary.  Significant leukocytosis- Improving, 18.2 today, trending down from 43- 01/22/2015. Blood cultures X2 - NGTD, respiratory cultures- Non pathogenic oro-pharyngeal flora. Also possible PNA on chest xray. Pancreatitis improving. On primaxin. Vanc D/cd.  Patient and Family Updated at bedside- Wife, daughter and Son in law - 01/25/2015.   LOS: 12 days    Ejiroghene E. Emokpae IMTS, PGY-2 01/25/2015, 1.50  PM     I have seen and evaluated the patient this morning on rounds with Dr. Mariea Clonts.  We have discussed the findings on the chart with all available data as well as consult notes. We discussed the examination findings as well as recommendations for management as indicated above.  Overall he seems to be significantly improved today. We finally have a stable hemoglobin level greater than 9 after 1 more unit of blood yesterday. I do suspect that the melanotic stool he had yesterday was residual from his earlier GI bleed. Part of the notable increase of hemoglobin levels as accommodation of transfusion and diuresis. He diuresed briskly yesterday and has much less third spacing. He still has edema with some pleural effusions so we will continue IV Lasix today and consider  possible by mouth Lasix over the weekend if he is able to tolerate by mouth. As far as nutrition goes he is now starting to take liquid diet which will hopefully be advanced. It does appear that his pancreatitis is resolving with normal amylase and lipase.  The plan for now is to transfer to TCU. Hopefully we can give stabilize enough after several stable days to potentially have him admitted to acute rehabilitation in order to be in a monitored setting where he can continue to build up his endurance, strength and nutritional status. I do appreciate Dr. Donata Clay stopping by to check in on the patient and provide new insight.   Marykay Lex, M.D., M.S. Interventional Cardiologist   Pager # [phone]

## 2015-01-25 NOTE — Progress Notes (Signed)
4 Days Post-Op Procedure(s) (LRB): ESOPHAGOGASTRODUODENOSCOPY (EGD) (N/A) Subjective: Patient had large melanotic stool yesterday and received one unit of packed cells  The patient was found have bilateral DVT by duplex ultrasound and a caval filter placed by IR. No evidence of PE  Chest x-ray today shows persistent pleural effusions-interstitial edema pattern. No angina  Patient is currently on clear  liquid diet He remains on Primaxin   with white count 18,000, afebrile for 36 hours.  Objective: Vital signs in last 24 hours: Temp:  [97.4 F (36.3 C)-98.5 F (36.9 C)] 98.3 F (36.8 C) (05/20 1200) Pulse Rate:  [77-97] 91 (05/20 1200) Cardiac Rhythm:  [-] Normal sinus rhythm (05/20 1200) Resp:  [22-33] 26 (05/20 1200) BP: (93-137)/(27-71) 126/63 mmHg (05/20 1200) SpO2:  [93 %-100 %] 99 % (05/20 1200) Weight:  [187 lb 13.3 oz (85.2 kg)] 187 lb 13.3 oz (85.2 kg) (05/20 0400)  Hemodynamic parameters for last 24 hours:   sinus  Intake/Output from previous day: 05/19 0701 - 05/20 0700 In: 1479.2 [I.V.:736.7; Blood:342.5; IV Piggyback:400] Out: 3255 [Urine:3255] Intake/Output this shift: Total I/O In: 340 [P.O.:240; IV Piggyback:100] Out: 365 [Urine:365]    Lab Results:  Recent Labs  01/24/15 0500 01/24/15 1832 01/25/15 0440  WBC 19.6*  --  18.2*  HGB 7.7* 9.4* 9.1*  HCT 22.9* 27.4* 27.1*  PLT 154  --  171   BMET:  Recent Labs  01/24/15 0500 01/25/15 0440  NA 145 145  K 4.0 3.9  CL 116* 117*  CO2 21* 22  GLUCOSE 197* 181*  BUN 47* 38*  CREATININE 2.11* 2.06*  CALCIUM 7.1* 7.1*    PT/INR:  Recent Labs  01/23/15 0447  LABPROT 17.6*  INR 1.43   ABG    Component Value Date/Time   PHART 7.440 01/22/2015 0325   HCO3 17.6* 01/22/2015 0325   TCO2 18.4 01/22/2015 0325   ACIDBASEDEF 5.8* 01/22/2015 0325   O2SAT 58.4 01/23/2015 1105   CBG (last 3)   Recent Labs  01/25/15 0437 01/25/15 0810 01/25/15 1200  GLUCAP 171* 162* 171*     Assessment/Plan: S/P Procedure(s) (LRB): ESOPHAGOGASTRODUODENOSCOPY (EGD) (N/A) Following patient for severe three-vessel coronary disease with reduced LV function and planned multivessel CABG after he recovers some pancreatitis, improves nutritional and physical condition and after white count normalizes off antibiotics  Continue physical therapy and nutritional support  We'll follow   LOS: 12 days    Kathlee Nations Trigt III 01/25/2015

## 2015-01-26 DIAGNOSIS — I255 Ischemic cardiomyopathy: Secondary | ICD-10-CM | POA: Insufficient documentation

## 2015-01-26 LAB — COMPREHENSIVE METABOLIC PANEL
ALBUMIN: 1.3 g/dL — AB (ref 3.5–5.0)
ALT: 104 U/L — ABNORMAL HIGH (ref 17–63)
AST: 149 U/L — AB (ref 15–41)
Alkaline Phosphatase: 85 U/L (ref 38–126)
Anion gap: 8 (ref 5–15)
BUN: 31 mg/dL — ABNORMAL HIGH (ref 6–20)
CHLORIDE: 116 mmol/L — AB (ref 101–111)
CO2: 24 mmol/L (ref 22–32)
Calcium: 6.9 mg/dL — ABNORMAL LOW (ref 8.9–10.3)
Creatinine, Ser: 1.85 mg/dL — ABNORMAL HIGH (ref 0.61–1.24)
GFR, EST AFRICAN AMERICAN: 40 mL/min — AB (ref >60)
GFR, EST NON AFRICAN AMERICAN: 35 mL/min — AB (ref >60)
Glucose, Bld: 239 mg/dL — ABNORMAL HIGH (ref 65–99)
Potassium: 3.6 mmol/L (ref 3.5–5.1)
Sodium: 148 mmol/L — ABNORMAL HIGH (ref 135–145)
TOTAL PROTEIN: 4.9 g/dL — AB (ref 6.5–8.1)
Total Bilirubin: 1.5 mg/dL — ABNORMAL HIGH (ref 0.3–1.2)

## 2015-01-26 LAB — GLUCOSE, CAPILLARY
GLUCOSE-CAPILLARY: 145 mg/dL — AB (ref 65–99)
GLUCOSE-CAPILLARY: 186 mg/dL — AB (ref 65–99)
GLUCOSE-CAPILLARY: 197 mg/dL — AB (ref 65–99)
GLUCOSE-CAPILLARY: 219 mg/dL — AB (ref 65–99)
Glucose-Capillary: 194 mg/dL — ABNORMAL HIGH (ref 65–99)
Glucose-Capillary: 210 mg/dL — ABNORMAL HIGH (ref 65–99)

## 2015-01-26 LAB — CBC
HCT: 27.9 % — ABNORMAL LOW (ref 39.0–52.0)
Hemoglobin: 9.4 g/dL — ABNORMAL LOW (ref 13.0–17.0)
MCH: 30.2 pg (ref 26.0–34.0)
MCHC: 33.7 g/dL (ref 30.0–36.0)
MCV: 89.7 fL (ref 78.0–100.0)
PLATELETS: 178 10*3/uL (ref 150–400)
RBC: 3.11 MIL/uL — ABNORMAL LOW (ref 4.22–5.81)
RDW: 15.3 % (ref 11.5–15.5)
WBC: 16.2 10*3/uL — ABNORMAL HIGH (ref 4.0–10.5)

## 2015-01-26 MED ORDER — INSULIN ASPART 100 UNIT/ML ~~LOC~~ SOLN
0.0000 [IU] | SUBCUTANEOUS | Status: DC
Start: 2015-01-26 — End: 2015-01-28
  Administered 2015-01-26: 7 [IU] via SUBCUTANEOUS
  Administered 2015-01-26: 3 [IU] via SUBCUTANEOUS
  Administered 2015-01-26: 4 [IU] via SUBCUTANEOUS
  Administered 2015-01-26: 3 [IU] via SUBCUTANEOUS
  Administered 2015-01-27 (×3): 7 [IU] via SUBCUTANEOUS
  Administered 2015-01-27 – 2015-01-28 (×2): 3 [IU] via SUBCUTANEOUS
  Administered 2015-01-28: 100 [IU] via SUBCUTANEOUS
  Administered 2015-01-28: 4 [IU] via SUBCUTANEOUS

## 2015-01-26 MED ORDER — INSULIN GLARGINE 100 UNIT/ML ~~LOC~~ SOLN
5.0000 [IU] | Freq: Every day | SUBCUTANEOUS | Status: DC
  Administered 2015-01-26 – 2015-01-28 (×3): 5 [IU] via SUBCUTANEOUS
  Filled 2015-01-26 (×4): qty 0.05

## 2015-01-26 MED ORDER — CARVEDILOL 3.125 MG PO TABS
3.1250 mg | ORAL_TABLET | Freq: Two times a day (BID) | ORAL | Status: DC
  Administered 2015-01-26 – 2015-02-02 (×14): 3.125 mg via ORAL
  Filled 2015-01-26 (×18): qty 1

## 2015-01-26 MED ORDER — ENOXAPARIN SODIUM 40 MG/0.4ML ~~LOC~~ SOLN
40.0000 mg | SUBCUTANEOUS | Status: DC
  Administered 2015-01-26 – 2015-02-04 (×10): 40 mg via SUBCUTANEOUS
  Filled 2015-01-26 (×11): qty 0.4

## 2015-01-26 NOTE — Progress Notes (Signed)
Subjective: No complaints today. No chest pains, abdominal pain, no bloody bowel movements anymore. Denies leg pain.   Objective: Vital signs in last 24 hours: Filed Vitals:   01/25/15 2200 01/26/15 0000 01/26/15 0200 01/26/15 0400  BP: 123/39 122/54 114/51 129/46  Pulse: 84 85 84 86  Temp:  98 F (36.7 C)  97.6 F (36.4 C)  TempSrc:  Oral  Oral  Resp: 24 24 27 22   Height:      Weight:      SpO2: 94% 96% 97% 96%   Weight change:   Intake/Output Summary (Last 24 hours) at 01/26/15 0701 Last data filed at 01/26/15 0400  Gross per 24 hour  Intake    680 ml  Output   3290 ml  Net  -2610 ml   General appearance: Alert, lying in bed, not in any distress Head: Normocephalic, without obvious abnormality, atraumatic Lungs: Coarse breath sounds, but no added sounds appreciated   Heart: S1 S2, no murmurs, rubs or gallops pprecaited.  Abdomen: Slightly firm, not tender, Active bowel sounds. Extremities: Left lower extremity pitting edema, shiny, no redness or tenderness on palpation, DP pulse difficult to appreciate bilat, but warm without skin changes  Tele- PVCs, present, still with ST depression, sinus rhythm, rate mostly 80s-90s.   Lab Results: Basic Metabolic Panel:  Recent Labs Lab 01/21/15 0005  01/22/15 0449  01/25/15 0440 01/26/15 0410  NA 138  < > 142  < > 145 148*  K 3.2*  < > 4.4  < > 3.9 3.6  CL 106  < > 117*  < > 117* 116*  CO2 24  < > 18*  < > 22 24  GLUCOSE 303*  < > 165*  < > 181* 239*  BUN 60*  < > 71*  < > 38* 31*  CREATININE 1.71*  < > 1.92*  < > 2.06* 1.85*  CALCIUM 6.4*  < > 6.1*  < > 7.1* 6.9*  MG 1.8  --  1.7  --   --   --   PHOS 3.3  --  3.0  --   --   --   < > = values in this interval not displayed. Liver Function Tests:  Recent Labs Lab 01/25/15 0440 01/26/15 0410  AST 149* 149*  ALT 96* 104*  ALKPHOS 72 85  BILITOT 1.7* 1.5*  PROT 5.1* 4.9*  ALBUMIN 1.4* 1.3*    Recent Labs Lab 01/24/15 1400 01/25/15 0440  LIPASE 13* 16*    AMYLASE 45  --    CBC:  Recent Labs Lab 01/21/15 0930  01/25/15 0440 01/26/15 0410  WBC 33.6*  < > 18.2* 16.2*  NEUTROABS 29.2*  --   --   --   HGB 6.8*  < > 9.1* 9.4*  HCT 19.7*  < > 27.1* 27.9*  MCV 89.5  < > 88.3 89.7  PLT 143*  < > 171 178  < > = values in this interval not displayed. Cardiac Enzymes:  Recent Labs Lab 01/21/15 0930 01/21/15 1840 01/21/15 2354  TROPONINI 0.86* 3.70* 5.81*   CBG:  Recent Labs Lab 01/25/15 0810 01/25/15 1200 01/25/15 1636 01/25/15 1916 01/25/15 2328 01/26/15 0404  GLUCAP 162* 171* 220* 248* 252* 219*   Coagulation:  Recent Labs Lab 01/20/15 2220 01/21/15 0930 01/23/15 0447  LABPROT 15.5* 18.5* 17.6*  INR 1.22 1.52* 1.43   Micro Results: Recent Results (from the past 240 hour(s))  Clostridium Difficile by PCR     Status:  None   Collection Time: 01/19/15  3:12 PM  Result Value Ref Range Status   C difficile by pcr NEGATIVE NEGATIVE Final  Culture, blood (routine x 2)     Status: None (Preliminary result)   Collection Time: 01/21/15  9:30 AM  Result Value Ref Range Status   Specimen Description BLOOD RIGHT ANTECUBITAL  Final   Special Requests Immunocompromised BAA 10CC EACH  Final   Culture   Final           BLOOD CULTURE RECEIVED NO GROWTH TO DATE CULTURE WILL BE HELD FOR 5 DAYS BEFORE ISSUING A FINAL NEGATIVE REPORT Performed at [facility]    Report Status PENDING  Incomplete  Culture, blood (routine x 2)     Status: None (Preliminary result)   Collection Time: 01/21/15  9:40 AM  Result Value Ref Range Status   Specimen Description BLOOD RIGHT ARM  Final   Special Requests Immunocompromised AEB 5CC  Final   Culture   Final           BLOOD CULTURE RECEIVED NO GROWTH TO DATE CULTURE WILL BE HELD FOR 5 DAYS BEFORE ISSUING A FINAL NEGATIVE REPORT Performed at [facility]    Report Status PENDING  Incomplete  Culture, respiratory (NON-Expectorated)     Status: None   Collection Time: 01/22/15  12:20 PM  Result Value Ref Range Status   Specimen Description TRACHEAL ASPIRATE  Final   Special Requests NONE  Final   Gram Stain   Final    FEW WBC PRESENT,BOTH PMN AND MONONUCLEAR NO SQUAMOUS EPITHELIAL CELLS SEEN NO ORGANISMS SEEN Performed at [facility]    Culture   Final    Non-Pathogenic Oropharyngeal-type Flora Isolated. Performed at [facility]    Report Status 01/24/2015 FINAL  Final   Studies/Results: Ir Ivc Filter Plmt / S&i /img Guid/mod Sed  01/24/2015   CLINICAL DATA:  Acute DVT. GI bleed, a relative contraindication to anticoagulation. Caval filtration is requested. Renal insufficiency. Indwelling right IJ triple-lumen catheter.  EXAM: INFERIOR VENACAVOGRAM  IVC FILTER PLACEMENT UNDER FLUOROSCOPY  FLUOROSCOPY TIME:  30 seconds, 35.6 mGy  TECHNIQUE: Patency of the left IJ vein was confirmed with ultrasound with image documentation. An appropriate skin site was determined. Skin site was marked, prepped with chlorhexidine, and draped using maximum barrier technique. The region was infiltrated locally with 1% lidocaine.  Intravenous Fentanyl and Versed were administered as conscious sedation during continuous cardiorespiratory monitoring by the radiology RN, with a total moderate sedation time of less than 30 minutes.  Under real-time ultrasound guidance, the left IJ vein was accessed with a 21 gauge micropuncture needle; the needle tip within the vein was confirmed with ultrasound image documentation. The needle was exchanged over a 018 guidewire for a transitional dilator, which allow advancement of the [facility] wire into the IVC. A long 6 French vascular sheath was placed for inferior venacavography using CO2. This demonstrated no caval thrombus. Renal vein inflows were evident.  The [facility] IVC filter was advanced through the sheath and successfully deployed under fluoroscopy at the L2 level. Followup CO2 cavagram demonstrates stable filter position and no evident  complication. The sheath was removed and hemostasis achieved at the site. No immediate complication.  IMPRESSION: 1. Normal IVC. No thrombus or significant anatomic variation. 2. Technically successful infrarenal IVC filter placement. This is a retrievable model.   Electronically Signed   By: Corlis Leak M.D.   On: 01/24/2015 17:44   Dg Chest  Port 1 View  01/25/2015   CLINICAL DATA:  Respiratory failure.  EXAM: PORTABLE CHEST - 1 VIEW  COMPARISON:  01/24/2015 .  FINDINGS: Right IJ line stable position. Stable cardiomegaly. Diffuse pulmonary alveolar infiltrates in pleural effusions again noted. No interim change . No pneumothorax. No acute bony abnormality P  IMPRESSION: 1. Right IJ line stable position. 2. Persistent cardiomegaly with bilateral pulmonary alveolar infiltrates and pleural effusions consistent with congestive heart failure. No interim change. Superimposed pneumonia cannot be excluded.   Electronically Signed   By: Maisie Fus  Register   On: 01/25/2015 07:22   Dg Swallowing Func-speech Pathology  01/24/2015    Objective Swallowing Evaluation:   MBS Patient Details  Name: Amelia Macken MRN: [id number] Date of Birth: 05-03-1941  Today's Date: 01/24/2015 Time: SLP Start Time (ACUTE ONLY): 1315-SLP Stop Time (ACUTE ONLY): 1340 SLP Time Calculation (min) (ACUTE ONLY): 25 min  Past Medical History:  Past Medical History  Diagnosis Date  . Hypertension   . Diabetes mellitus type 2 in obese   . Cholelithiasis    Past Surgical History:  Past Surgical History  Procedure Laterality Date  . Cardiac catheterization N/A 01/17/2015    Procedure: Left Heart Cath and Coronary Angiography;  Surgeon: Lyn Records, MD;  Location: [facility] INVASIVE CV LAB;  Service: Cardiovascular;   Laterality: N/A;  . Esophagogastroduodenoscopy N/A 01/21/2015    Procedure: ESOPHAGOGASTRODUODENOSCOPY (EGD);  Surgeon: Hilarie Fredrickson, MD;   Location: [facility] ENDOSCOPY;  Service: Endoscopy;  Laterality: N/A;  to be  done at BEDSIDE   HPI:  Other Pertinent  Information: 74 y/o M admitted 5/8 after a syncopal  episode and found to have acute pancreatitis. Also positive  troponin/NSTEMI. EGD shows grade 4 esophagitis with deep ulcers, eschar,  friability, blood clots in stomach. Intubated from 5/16 to 5/18. Pt  reprotedly having difficulty swallowing dinner on 5/18.   No Data Recorded  Assessment / Plan / Recommendation CHL IP CLINICAL IMPRESSIONS 01/24/2015  Therapy Diagnosis Moderate oral phase dysphagia;Moderate pharyngeal phase  dysphagia;Moderate cervical esophageal phase dysphagia  Clinical Impression MBS complete. Overt s/s of aspiration not seen during  exam this afternoon as noted on bedside eval this am.  Patient does  present with what appears to be a multifactorial dysphagia with laryngeal  and pharyngeal weakness and possible esophageal component, resulting  decreased pharyngeal squeeze, decreased relaxation of the UES, and  moderate pharyngeal residuals post swallow, greater with solids vs  liquids. Despite above however, patient fully protecting airway during  study. Should be notes that patient consuming solids and liquids via very  small boluses (less than 1/2 tsp) despite max clinician encouragement for  larger size; ? cognitive status contributing as patient also presents with  decreased sustained attention to task. Aspiration risk remains moderately  high given degree of residuals without patient awareness or ability to  clear within a reasonable time frame. Will initiate a po diet and f/u  closely for tolerance and potential to advance.       CHL IP TREATMENT RECOMMENDATION 01/24/2015  Treatment Recommendations Therapy as outlined in treatment plan below     CHL IP DIET RECOMMENDATION 01/24/2015  SLP Diet Recommendations Dysphagia 1 (Puree);Thin  Liquid Administration via (None)  Medication Administration Crushed with puree  Compensations Small sips/bites;Slow rate;Multiple dry swallows after each  bite/sip;Follow solids with liquid  Postural Changes  and/or Swallow Maneuvers (None)     CHL IP OTHER RECOMMENDATIONS 01/24/2015  Recommended Consults (None)  Oral Care Recommendations Oral care  BID  Other Recommendations (None)     No flowsheet data found.   CHL IP FREQUENCY AND DURATION 01/24/2015  Speech Therapy Frequency (ACUTE ONLY) min 2x/week  Treatment Duration 1 week         SLP Swallow Goals No flowsheet data found.  No flowsheet data found.    CHL IP REASON FOR REFERRAL 01/24/2015  Reason for Referral Objectively evaluate swallowing function     Ferdinand Lango MA, CCC-SLP [phone]               Ferdinand Lango Meryl 01/24/2015, 1:52 PM    Medications: I have reviewed the patient's current medications. Scheduled Meds: . sodium chloride   Intravenous Once  . feeding supplement (RESOURCE BREEZE)  1 Container Oral TID BM  . imipenem-cilastatin  250 mg Intravenous 4 times per day  . insulin aspart  0-15 Units Subcutaneous 6 times per day  . latanoprost  1 drop Both Eyes QHS  . metoCLOPramide (REGLAN) injection  5 mg Intravenous 3 times per day  . pantoprazole (PROTONIX) IV  40 mg Intravenous Q12H  . ranolazine  500 mg Oral BID   Continuous Infusions: . sodium chloride Stopped (01/22/15 1700)   PRN Meds:.acetaminophen, acetaminophen, bisacodyl, fentaNYL (SUBLIMAZE) injection, ondansetron (ZOFRAN) IV Assessment/Plan: Principal Problem:   Acute gallstone pancreatitis Active Problems:   Abdominal pain, epigastric   Nausea with vomiting   Elevated LFTs   Gallstones   Syncope   Hypotension   Bradycardia   AKI (acute kidney injury)   Diabetes   Pancreatitis   NSTEMI (non-ST elevated myocardial infarction)   Cardiomyopathy   Choledocholithiasis   Hypocalcemia   Ileus   Acute biliary pancreatitis   Leukocytosis   Syncope and collapse   Acute systolic congestive heart failure   Essential hypertension   Other specified hypotension   Acute kidney injury   Diabetes type 2, uncontrolled   CAD, multiple vessel: CTO of RCA, Cx with severe  prox LAD disease   Calculus of gallbladder with acute cholecystitis without obstruction   Cholecystitis, acute   Metabolic acidosis   Acute systolic CHF (congestive heart failure)   Acute renal failure syndrome   Acute blood loss anemia   Hemorrhagic shock   Acute respiratory failure with hypoxemia   Septic shock   Ulcerative esophagitis   Hematemesis with nausea   Melena   Demand ischemia of myocardium   Erosive esophagitis   HCAP (healthcare-associated pneumonia)   Shock   DVT (deep venous thrombosis)  Hemorrhagic Shock/Acute Blood Loss Anemia- resolved, S/p 5 units of PRBC. Hgb stable at 9.4. No more bloody bowel movements. - Goal Hgb >8 - Transfer to Step down. - Now on BID IV protonix .  CAD- Milti-vessel dx on Cath- 01/17/2015, planned for CABG when acute issues resolve.Severe native vessel coronary disease with total occlusion of the RCA, total occlusion of the proximal circumflex, and severe proximal LAD disease. Still has some ST depressions. - CVTS Recs appreciated - Hold antiplatelet and IV heparin for now. - Conservative management for now, considering significant Co-morbities. - Cont Ranolaxine  CARDIOMYOPATHY: EF 30 - 40%. Chest xray with Pulm infiltrates likely due to edema, also with pleural effusion, some improvement..  - On ranolazine - Consider starting BB- carvedilol  - Consider switching to PO lasix-  daily.  DVT- Has not been on DVT ppx, due to acute severe GI bleed, so far requiring 5units of blood, hg stable x 2 days - S/p IVC filter placement.   Acute  Pancreatitis with Acute calculous cholecystitis- Per GI. Improving leukocytosis. Liver enzymes elevated but stable- AST- 149, ALT- 104. Lipase and amylase now normal. - Now on clear diet, advance diet  AKI- Improving, Cr- 1.85 today. Peak 2.28- 01/21/2015- likely due to ATN, and diuretics, Now net neg-+2.2L since admission. Out- 3.29L over the past 24 hrs.   - Bmet Am. - PO lasix- 40mg   daily  Erosive Esophagitis- bleeding appears to have significantly slowed/stopped, as Hgb is stable, no obvious bleeding per rectum. Now on clear liq diet.  - Now ON PPI IV BID - CBC am - Nutrition consult  Diabetes- On SSI Q4H.  Per primary. - Switch to SSI- AC HS  Significant leukocytosis- Improving, 16.2 today, trending down from 43- 01/22/2015. Blood cultures X2 - NGTD, respiratory cultures- Non pathogenic oro-pharyngeal flora. Also possible PNA on chest xray. Pancreatitis improving. On primaxin. Vanc D/cd.  Patient and Family Updated at bedside- Wife, daughter and Son in law    LOS: 13 days    Ejiroghene ERonalee Belts, PGY-2 01/26/2015, 1.50 PM   Patient seen and examined with Dr. Mariea Clonts. We discussed all aspects of the encounter. I agree with the assessment and plan as stated above.   He is improving slowly. No ischemic symptoms. Volume status looks good. IVC filter in place and HGb stable. Ab pain resolved. He is hungry.   Will start carvedilol 3.125 bid. May be able to add ACE soon. Advance diet. Start lovenox 40 daily carefully to help prevent clot extension. Watch H/H closely.   Remove foley.   Consult CIR. Likely benefit from rehab stay prior to CABG. D/w Dr. Joseph Art at bedside.   Kenlynn Houde,MD 1:27 PM

## 2015-01-26 NOTE — Progress Notes (Signed)
[facility] TEAM 1 - Stepdown/ICU TEAM Progress Note  Kaesen Rodriguez XBM:[id number] DOB: 06/27/1941 DOA: 01/13/2015 PCP: Monico Blitz, MD  Admit HPI / Brief Narrative: 74 yo WM PMHx HTN and DM Type 2 Presented to [facility] ED after a syncopal episode. He had been having persistent epigastric abdominal pain with associated nausea. The pain progressively got worse, and up to an 8/10 when he passed out. EMS noted he was bradycardic in the 30s with SBP documented in the mid 60s.He had previously been told he has a gallbladder full of stones but had been asymptomatic w/ no h/o pancreatitis.   In ED at [place] showed CBD stone and pancreatitis. Lip was >300 (no actual level). AST/ALT 247/212 - tbili 1.9 - alk phos normal. Lactic acid level 2.2 . Creat 1.7. The pt was transferred to [facility].    HPI/Subjective: 5/21 A/O 4, states negative abdominal pain, negative N/V, negative CP, negative SOB, positive continued Fatigue  Assessment/Plan: Acute Gallstone pancreatitis  -Plan was initially for ERCP, however secondary to patient's possible NSTEMI will proceed today with MRCP instead. -KUB shows possible small ileus : Resolving, patient able to take  PO nutrition without significant abdominal pain, N/V. -Continue Primaxin which was started on 5/16  Leukocytosis -Still elevated but trending down   Syncope and collapse -Most likely multifactorial to include vagal, bradycardia, NSTEMI,and systolic CHF  -Continue management per cardiology  Bradycardia  -See syncope and collapse  NSTEMI/Severe 3 vessel CAD/Ischemic cardiomyopathy -Per cardiology attempting to stabilize patient for CABG;   -Strict in and out + 2.3 L -Daily weight  Acute systolic congestive heart failure -Medications per cardiology  HTN/Hypotension  -Resolved on no medication   Acute kidney injury?  vs   chronic kidney failure -Unable to determine patient's baseline kidney function through review of EMR.    -Cr trending down.  DM Type 2 uncontrolled -5/8 hemoglobin A1c = 7.5  -Start Lantus 5 units QHS -Continue resistant SSI   Hypocalcemia -5/21 Corrected calcium= 9.2   Code Status: FULL Family Communication:  No family present at time of exam Disposition Plan: - timing of ERCP per GI - timing of cardiac cath per cardiology    Consultants: Dr.Peter Prescott Gum (cardiothoracic surgery) Dr.John Delice Lesch (GI) Dr.David Loren Racer (Cardiology)   Procedure/Significant Events: TTE - 5/9 - EF 30-40% - akinesis of basal-midinferior myocardium - severe hypokinesis lateral myocardium - grade 1 DD 5/10 KUB;Mild dilatation of small bowel likely representing a small-bowel ileus. 5/12 cardiac catheterization; Severe native vessel coronary disease with total occlusion of the RCA/proximal circumflex,-severe proximal LAD disease. -Dominant diagonal contains segmental 50% narrowing. -left ventricle significant systolic impairment; -UUVO=53% with inferior wall severe hypokinesis. 5/19 placement of retrievable infrarenal IVC filter     Culture 5/18 MRSA by PCR negative 5/14 C. difficile by PCR negative 5/16 blood right antecubital/right arm NGTD 5/17 trach aspirate negative    Antibiotics: Zosyn 5/12>> stopped 5/13 Vancomycin 5/16>> stopped 5/19 Primaxin 5/16>>  DVT prophylaxis: IV heparin    Devices    LINES / TUBES:      Continuous Infusions: . sodium chloride Stopped (01/22/15 1700)    Objective: VITAL SIGNS: Temp: 98.1 F (36.7 C) (05/21 2007) Temp Source: Oral (05/21 2007) BP: 137/57 mmHg (05/21 2007) Pulse Rate: 89 (05/21 2007) SPO2; FIO2:   Intake/Output Summary (Last 24 hours) at 01/26/15 2111 Last data filed at 01/26/15 2000  Gross per 24 hour  Intake    880 ml  Output   1170 ml  Net   -  290 ml     Exam: General: A/O 4, NAD, No acute respiratory distress Eyes: Negative eye pain, double vision, scotomas, floaters, negative retinal hemorrhage ENT:  Negative Runny nose, negative ear pain, negative tinnitus,  Neck:  Negative scars, masses, torticollis, lymphadenopathy, JVD Skin ; negative rash, lesions, wounds, right IJ site clean and covered negative sign of infection  Lungs: Clear to auscultation bilaterally without wheezes or crackles Cardiovascular: Regular rate and rhythm without murmur gallop or rub normal S1 and S2 Abdomen: Mild generalized tenderness, nondistended, soft, bowel sounds positive, no rebound, no ascites, no appreciable mass Extremities: No significant cyanosis, clubbing, or edema bilateral lower extremities Psychiatric:  Negative depression, negative anxiety, negative fatigue, negative mania  Neurologic:  Cranial nerves II through XII intact, negative dysarthria, negative expressive aphasia, negative receptive aphasia.     Data Reviewed: Basic Metabolic Panel:  Recent Labs Lab 01/20/15 0338 01/21/15 0005  01/22/15 0449 01/23/15 0447 01/23/15 2000 01/24/15 0500 01/25/15 0440 01/26/15 0410  NA 143 138  < > 142 144 144 145 145 148*  K 2.7* 3.2*  < > 4.4 4.3 3.8 4.0 3.9 3.6  CL 104 106  < > 117* 120* 118* 116* 117* 116*  CO2 28 24  < > 18* 19* 20* 21* 22 24  GLUCOSE 246* 303*  < > 165* 175* 282* 197* 181* 239*  BUN 46* 60*  < > 71* 60* 47* 47* 38* 31*  CREATININE 1.61* 1.71*  < > 1.92* 1.89* 1.89* 2.11* 2.06* 1.85*  CALCIUM 6.7* 6.4*  < > 6.1* 6.9* 6.3* 7.1* 7.1* 6.9*  MG 2.1 1.8  --  1.7  --   --   --   --   --   PHOS 2.4* 3.3  --  3.0  --   --   --   --   --   < > = values in this interval not displayed. Liver Function Tests:  Recent Labs Lab 01/21/15 0930 01/23/15 0447 01/24/15 1400 01/25/15 0440 01/26/15 0410  AST 34 94* 146* 149* 149*  ALT 28 63 90* 96* 104*  ALKPHOS 50 66 62 72 85  BILITOT 1.2 1.1 1.1 1.7* 1.5*  PROT 3.6* 4.3* 4.5* 5.1* 4.9*  ALBUMIN 1.3* 1.3* 1.3* 1.4* 1.3*    Recent Labs Lab 01/21/15 0930 01/24/15 1400 01/25/15 0440  LIPASE 22 13* 16*  AMYLASE  --  45  --    No  results for input(s): AMMONIA in the last 168 hours. CBC:  Recent Labs Lab 01/21/15 0930 01/22/15 0449 01/23/15 0447 01/24/15 0500 01/24/15 1832 01/25/15 0440 01/26/15 0410  WBC 33.6* 43.0* 25.2* 19.6*  --  18.2* 16.2*  NEUTROABS 29.2*  --   --   --   --   --   --   HGB 6.8* 7.4* 7.4* 7.7* 9.4* 9.1* 9.4*  HCT 19.7* 21.4* 22.0* 22.9* 27.4* 27.1* 27.9*  MCV 89.5 87.3 88.7 88.4  --  88.3 89.7  PLT 143* 146* 134* 154  --  171 178   Cardiac Enzymes:  Recent Labs Lab 01/21/15 0930 01/21/15 1840 01/21/15 2354  TROPONINI 0.86* 3.70* 5.81*   BNP (last 3 results) No results for input(s): BNP in the last 8760 hours.  ProBNP (last 3 results) No results for input(s): PROBNP in the last 8760 hours.  CBG:  Recent Labs Lab 01/26/15 0404 01/26/15 0904 01/26/15 1142 01/26/15 1635 01/26/15 2005  GLUCAP 219* 194* 186* 210* 197*    Recent Results (from the past 240 hour(s))  Clostridium Difficile by PCR     Status: None   Collection Time: 01/19/15  3:12 PM  Result Value Ref Range Status   C difficile by pcr NEGATIVE NEGATIVE Final  Culture, blood (routine x 2)     Status: None (Preliminary result)   Collection Time: 01/21/15  9:30 AM  Result Value Ref Range Status   Specimen Description BLOOD RIGHT ANTECUBITAL  Final   Special Requests Immunocompromised BAA 10CC EACH  Final   Culture   Final           BLOOD CULTURE RECEIVED NO GROWTH TO DATE CULTURE WILL BE HELD FOR 5 DAYS BEFORE ISSUING A FINAL NEGATIVE REPORT Performed at [facility]    Report Status PENDING  Incomplete  Culture, blood (routine x 2)     Status: None (Preliminary result)   Collection Time: 01/21/15  9:40 AM  Result Value Ref Range Status   Specimen Description BLOOD RIGHT ARM  Final   Special Requests Immunocompromised AEB 5CC  Final   Culture   Final           BLOOD CULTURE RECEIVED NO GROWTH TO DATE CULTURE WILL BE HELD FOR 5 DAYS BEFORE ISSUING A FINAL NEGATIVE REPORT Performed at [facility]    Report Status PENDING  Incomplete  Culture, respiratory (NON-Expectorated)     Status: None   Collection Time: 01/22/15 12:20 PM  Result Value Ref Range Status   Specimen Description TRACHEAL ASPIRATE  Final   Special Requests NONE  Final   Gram Stain   Final    FEW WBC PRESENT,BOTH PMN AND MONONUCLEAR NO SQUAMOUS EPITHELIAL CELLS SEEN NO ORGANISMS SEEN Performed at [facility]    Culture   Final    Non-Pathogenic Oropharyngeal-type Flora Isolated. Performed at [facility]    Report Status 01/24/2015 FINAL  Final     Studies:  Recent x-ray studies have been reviewed in detail by the Attending Physician  Scheduled Meds:  Scheduled Meds: . sodium chloride   Intravenous Once  . carvedilol  3.125 mg Oral BID WC  . enoxaparin (LOVENOX) injection  40 mg Subcutaneous Q24H  . feeding supplement (RESOURCE BREEZE)  1 Container Oral TID BM  . imipenem-cilastatin  250 mg Intravenous 4 times per day  . insulin aspart  0-20 Units Subcutaneous 6 times per day  . insulin glargine  5 Units Subcutaneous QHS  . latanoprost  1 drop Both Eyes QHS  . metoCLOPramide (REGLAN) injection  5 mg Intravenous 3 times per day  . pantoprazole (PROTONIX) IV  40 mg Intravenous Q12H  . ranolazine  500 mg Oral BID    Time spent on care of this patient: 40 mins   WOODS, Geraldo Docker , MD  [facility] Office  [phone] Pager - [phone]  On-Call/Text Page:      [email or web address]      password [email or web address]  If 7PM-7AM, please contact night-coverage [email or web address] Password [email or web address] 01/26/2015, 9:11 PM   LOS: 13 days   Care during the described time interval was provided by me .  I have reviewed this patient's available data, including medical history, events of note, physical examination, and all test results as part of my evaluation. I have personally reviewed and interpreted all radiology studies. Dia Crawford, MD [phone] Pager

## 2015-01-27 DIAGNOSIS — I255 Ischemic cardiomyopathy: Secondary | ICD-10-CM | POA: Insufficient documentation

## 2015-01-27 LAB — GLUCOSE, CAPILLARY
GLUCOSE-CAPILLARY: 137 mg/dL — AB (ref 65–99)
Glucose-Capillary: 134 mg/dL — ABNORMAL HIGH (ref 65–99)
Glucose-Capillary: 212 mg/dL — ABNORMAL HIGH (ref 65–99)

## 2015-01-27 LAB — CBC WITH DIFFERENTIAL/PLATELET
BASOS ABS: 0 10*3/uL (ref 0.0–0.1)
BASOS PCT: 0 % (ref 0–1)
EOS ABS: 0.1 10*3/uL (ref 0.0–0.7)
Eosinophils Relative: 1 % (ref 0–5)
HCT: 27.3 % — ABNORMAL LOW (ref 39.0–52.0)
Hemoglobin: 8.9 g/dL — ABNORMAL LOW (ref 13.0–17.0)
LYMPHS ABS: 1.4 10*3/uL (ref 0.7–4.0)
LYMPHS PCT: 10 % — AB (ref 12–46)
MCH: 29.6 pg (ref 26.0–34.0)
MCHC: 32.6 g/dL (ref 30.0–36.0)
MCV: 90.7 fL (ref 78.0–100.0)
MONO ABS: 0.5 10*3/uL (ref 0.1–1.0)
Monocytes Relative: 4 % (ref 3–12)
Neutro Abs: 11.8 10*3/uL — ABNORMAL HIGH (ref 1.7–7.7)
Neutrophils Relative %: 85 % — ABNORMAL HIGH (ref 43–77)
Platelets: 175 10*3/uL (ref 150–400)
RBC: 3.01 MIL/uL — ABNORMAL LOW (ref 4.22–5.81)
RDW: 14.9 % (ref 11.5–15.5)
WBC: 13.9 10*3/uL — ABNORMAL HIGH (ref 4.0–10.5)

## 2015-01-27 LAB — CULTURE, BLOOD (ROUTINE X 2)
CULTURE: NO GROWTH
Culture: NO GROWTH

## 2015-01-27 LAB — COMPREHENSIVE METABOLIC PANEL
ALBUMIN: 1.3 g/dL — AB (ref 3.5–5.0)
ALT: 94 U/L — ABNORMAL HIGH (ref 17–63)
AST: 139 U/L — ABNORMAL HIGH (ref 15–41)
Alkaline Phosphatase: 74 U/L (ref 38–126)
Anion gap: 7 (ref 5–15)
BUN: 26 mg/dL — ABNORMAL HIGH (ref 6–20)
CALCIUM: 6.7 mg/dL — AB (ref 8.9–10.3)
CHLORIDE: 117 mmol/L — AB (ref 101–111)
CO2: 23 mmol/L (ref 22–32)
Creatinine, Ser: 1.8 mg/dL — ABNORMAL HIGH (ref 0.61–1.24)
GFR calc Af Amer: 42 mL/min — ABNORMAL LOW (ref >60)
GFR, EST NON AFRICAN AMERICAN: 36 mL/min — AB (ref >60)
Glucose, Bld: 146 mg/dL — ABNORMAL HIGH (ref 65–99)
Potassium: 3.6 mmol/L (ref 3.5–5.1)
SODIUM: 147 mmol/L — AB (ref 135–145)
Total Bilirubin: 1.4 mg/dL — ABNORMAL HIGH (ref 0.3–1.2)
Total Protein: 5.1 g/dL — ABNORMAL LOW (ref 6.5–8.1)

## 2015-01-27 LAB — LIPASE, BLOOD: LIPASE: 15 U/L — AB (ref 22–51)

## 2015-01-27 LAB — MAGNESIUM: Magnesium: 1.8 mg/dL (ref 1.7–2.4)

## 2015-01-27 MED ORDER — POTASSIUM CHLORIDE 10 MEQ/100ML IV SOLN
10.0000 meq | INTRAVENOUS | Status: AC
  Administered 2015-01-27 (×2): 10 meq via INTRAVENOUS
  Filled 2015-01-27 (×2): qty 100

## 2015-01-27 MED ORDER — MAGNESIUM SULFATE 2 GM/50ML IV SOLN
2.0000 g | Freq: Once | INTRAVENOUS | Status: AC
  Administered 2015-01-27: 2 g via INTRAVENOUS
  Filled 2015-01-27: qty 50

## 2015-01-27 NOTE — Progress Notes (Addendum)
Subjective:  Remains weak but continues to feel better. Tolerating regular diet. No CP. No further bleeding. Breathing ok.   Objective: Vital signs in last 24 hours: Filed Vitals:   01/27/15 0343 01/27/15 0400 01/27/15 0749 01/27/15 0820  BP:  114/50 113/48 128/53  Pulse: 84 85 82 86  Temp: 97.4 F (36.3 C)  99.4 F (37.4 C)   TempSrc: Oral  Oral   Resp: Height:      Weight:      SpO2: 96% 88% 97% 96%   Weight change:   Intake/Output Summary (Last 24 hours) at 01/27/15 1225 Last data filed at 01/27/15 0500  Gross per 24 hour  Intake    660 ml  Output    595 ml  Net     65 ml   General appearance: sitting in chair, not in any distress Head: Normocephalic, without obvious abnormality, atraumatic Lungs: Clear Heart: S1 S2, no murmurs, rubs or gallops apprecaited.  Abdomen: Slightly firm, not tender, Active bowel sounds. Extremities: 1+ edema, shiny, no redness or tenderness on palpation, DP pulse difficult to appreciate bilat, but warm without skin changes  Tele- SR  Lab Results: Basic Metabolic Panel:  Recent Labs Lab 01/21/15 0005  01/22/15 0449  01/26/15 0410 01/27/15 0400  NA 138  < > 142  < > 148* 147*  K 3.2*  < > 4.4  < > 3.6 3.6  CL 106  < > 117*  < > 116* 117*  CO2 24  < > 18*  < > 24 23  GLUCOSE 303*  < > 165*  < > 239* 146*  BUN 60*  < > 71*  < > 31* 26*  CREATININE 1.71*  < > 1.92*  < > 1.85* 1.80*  CALCIUM 6.4*  < > 6.1*  < > 6.9* 6.7*  MG 1.8  --  1.7  --   --  1.8  PHOS 3.3  --  3.0  --   --   --   < > = values in this interval not displayed. Liver Function Tests:  Recent Labs Lab 01/26/15 0410 01/27/15 0400  AST 149* 139*  ALT 104* 94*  ALKPHOS 85 74  BILITOT 1.5* 1.4*  PROT 4.9* 5.1*  ALBUMIN 1.3* 1.3*    Recent Labs Lab 01/24/15 1400 01/25/15 0440 01/27/15 0400  LIPASE 13* 16* 15*  AMYLASE 45  --   --    CBC:  Recent Labs Lab 01/21/15 0930  01/26/15 0410 01/27/15 0400  WBC 33.6*  < > 16.2* 13.9*    NEUTROABS 29.2*  --   --  11.8*  HGB 6.8*  < > 9.4* 8.9*  HCT 19.7*  < > 27.9* 27.3*  MCV 89.5  < > 89.7 90.7  PLT 143*  < > 178 175  < > = values in this interval not displayed. Cardiac Enzymes:  Recent Labs Lab 01/21/15 0930 01/21/15 1840 01/21/15 2354  TROPONINI 0.86* 3.70* 5.81*   CBG:  Recent Labs Lab 01/26/15 1142 01/26/15 1635 01/26/15 2005 01/26/15 2348 01/27/15 0343 01/27/15 0753  GLUCAP 186* 210* 197* 145* 134* 137*   Coagulation:  Recent Labs Lab 01/20/15 2220 01/21/15 0930 01/23/15 0447  LABPROT 15.5* 18.5* 17.6*  INR 1.22 1.52* 1.43   Micro Results: Recent Results (from the past 240 hour(s))  Clostridium Difficile by PCR     Status: None   Collection Time: 01/19/15  3:12 PM  Result Value Ref Range Status  C difficile by pcr NEGATIVE NEGATIVE Final  Culture, blood (routine x 2)     Status: None   Collection Time: 01/21/15  9:30 AM  Result Value Ref Range Status   Specimen Description BLOOD RIGHT ANTECUBITAL  Final   Special Requests BOTTLES DRAWN AEROBIC AND ANAEROBIC 10CC  Final   Culture   Final    NO GROWTH 5 DAYS Performed at [facility]    Report Status 01/27/2015 FINAL  Final  Culture, blood (routine x 2)     Status: None   Collection Time: 01/21/15  9:40 AM  Result Value Ref Range Status   Specimen Description BLOOD RIGHT ARM  Final   Special Requests BOTTLES DRAWN AEROBIC ONLY 5CC  Final   Culture   Final    NO GROWTH 5 DAYS Performed at [facility]    Report Status 01/27/2015 FINAL  Final  Culture, respiratory (NON-Expectorated)     Status: None   Collection Time: 01/22/15 12:20 PM  Result Value Ref Range Status   Specimen Description TRACHEAL ASPIRATE  Final   Special Requests NONE  Final   Gram Stain   Final    FEW WBC PRESENT,BOTH PMN AND MONONUCLEAR NO SQUAMOUS EPITHELIAL CELLS SEEN NO ORGANISMS SEEN Performed at [facility]    Culture   Final    Non-Pathogenic Oropharyngeal-type Flora  Isolated. Performed at [facility]    Report Status 01/24/2015 FINAL  Final   Studies/Results: No results found. Medications: I have reviewed the patient's current medications. Scheduled Meds: . sodium chloride   Intravenous Once  . carvedilol  3.125 mg Oral BID WC  . enoxaparin (LOVENOX) injection  40 mg Subcutaneous Q24H  . feeding supplement (RESOURCE BREEZE)  1 Container Oral TID BM  . imipenem-cilastatin  250 mg Intravenous 4 times per day  . insulin aspart  0-20 Units Subcutaneous 6 times per day  . insulin glargine  5 Units Subcutaneous QHS  . latanoprost  1 drop Both Eyes QHS  . metoCLOPramide (REGLAN) injection  5 mg Intravenous 3 times per day  . pantoprazole (PROTONIX) IV  40 mg Intravenous Q12H  . ranolazine  500 mg Oral BID   Continuous Infusions: . sodium chloride Stopped (01/22/15 1700)   PRN Meds:.acetaminophen, acetaminophen, bisacodyl, fentaNYL (SUBLIMAZE) injection, ondansetron (ZOFRAN) IV Assessment/Plan: Principal Problem:   Acute gallstone pancreatitis Active Problems:   Abdominal pain, epigastric   Nausea with vomiting   Elevated LFTs   Gallstones   Syncope   Hypotension   Bradycardia   AKI (acute kidney injury)   Diabetes   Pancreatitis   NSTEMI (non-ST elevated myocardial infarction)   Cardiomyopathy   Choledocholithiasis   Hypocalcemia   Ileus   Acute biliary pancreatitis   Leukocytosis   Syncope and collapse   Acute systolic congestive heart failure   Essential hypertension   Other specified hypotension   Acute kidney injury   Diabetes type 2, uncontrolled   CAD, multiple vessel: CTO of RCA, Cx with severe prox LAD disease   Calculus of gallbladder with acute cholecystitis without obstruction   Cholecystitis, acute   Metabolic acidosis   Acute systolic CHF (congestive heart failure)   Acute renal failure syndrome   Acute blood loss anemia   Hemorrhagic shock   Acute respiratory failure with hypoxemia   Septic shock    Ulcerative esophagitis   Hematemesis with nausea   Melena   Demand ischemia of myocardium   Erosive esophagitis   HCAP (healthcare-associated pneumonia)  Shock   DVT (deep venous thrombosis)   Cardiomyopathy, ischemic  1. Hemorrhagic Shock/Acute Blood Loss Anemia- resolved, S/p 5 units of PRBC. Hgb stable around 9. No more bloody bowel movements. 2. CAD- Multi-vessel dx on Cath- 01/17/2015, planned for CABG when acute issues resolve.Severe native vessel coronary disease with total occlusion of the RCA, total occlusion of the proximal circumflex, and severe proximal LAD disease.  - CVTS Recs appreciated - Resume asa 81 - Conservative management for now, considering significant Co-morbities. Would likely benefit from CIR stay prior to cabg.  - Cont carvedilol and ranexa.  3. Acute systolic HF: EF 30 - 40%.  - volume status looks good. Now on low-dose carvedilol. No ACE/ARB due to renal failure. Start low-dose hydral-nitrates 4. Acute LE DVTs- Off systemic heparin due to acute severe GI bleed, so far requiring 5units of blood, hgb stable x 3 days. Back on low-dose enoxaparin. If hgb stable tomorrow will consider restarting anti-coagulation - S/p IVC filter placement.  5. Acute Pancreatitis with Acute calculous cholecystitis- Per GI. Improving leukocytosis. Liver enzymes elevated but stable- AST- 149, ALT- 104. Lipase and amylase now normal. On primaxin - Tolerating regular diet - Per TRH 6. AKI- Improving, Cr- 1.80 today. Peak 2.28- 01/21/2015-  7. Erosive Esophagitis- 8. Diabetes- On SSI   Overall improving. No evidence of angina or HF. Will continue coreg. Add hydral/nitrates. Resume ASA carefully. No diuretic yet. On DVT prophylaxis dose enox. Will need rehab prior to CABG. CIR consult pending.    Lavontay Kirk,MD 12:25 PM

## 2015-01-27 NOTE — Progress Notes (Signed)
NT reported pt vomitted small amount of green emesis. Pt denies having any nausea prior to and denies any nausea post, offered medication, pt states feels fine now. No med given.

## 2015-01-27 NOTE — Progress Notes (Signed)
ANTIBIOTIC CONSULT NOTE - FOLLOW UP  Pharmacy Consult for Imipenem Indication: Pancreatitis & HCAP  No Known Allergies  Patient Measurements: Height: 6' (182.9 cm) Weight: 187 lb 13.3 oz (85.2 kg) IBW/kg (Calculated) : 77.6  Vital Signs: Temp: 99.4 F (37.4 C) (05/22 0749) Temp Source: Oral (05/22 0749) BP: 128/53 mmHg (05/22 0820) Pulse Rate: 86 (05/22 0820) Intake/Output from previous day: 05/21 0701 - 05/22 0700 In: 880 [P.O.:480; IV Piggyback:400] Out: 920 [Urine:920] Intake/Output from this shift:    Labs:  Recent Labs  01/25/15 0440 01/26/15 0410 01/27/15 0400  WBC 18.2* 16.2* 13.9*  HGB 9.1* 9.4* 8.9*  PLT 171 178 175  CREATININE 2.06* 1.85* 1.80*   Estimated Creatinine Clearance: 40.7 mL/min (by C-G formula based on Cr of 1.8). No results for input(s): VANCOTROUGH, VANCOPEAK, VANCORANDOM, GENTTROUGH, GENTPEAK, GENTRANDOM, TOBRATROUGH, TOBRAPEAK, TOBRARND, AMIKACINPEAK, AMIKACINTROU, AMIKACIN in the last 72 hours.   Microbiology: Recent Results (from the past 720 hour(s))  MRSA PCR Screening     Status: None   Collection Time: 01/13/15  6:33 PM  Result Value Ref Range Status   MRSA by PCR NEGATIVE NEGATIVE Final    Comment:        The GeneXpert MRSA Assay (FDA approved for NASAL specimens only), is one component of a comprehensive MRSA colonization surveillance program. It is not intended to diagnose MRSA infection nor to guide or monitor treatment for MRSA infections.   Clostridium Difficile by PCR     Status: None   Collection Time: 01/19/15  3:12 PM  Result Value Ref Range Status   C difficile by pcr NEGATIVE NEGATIVE Final  Culture, blood (routine x 2)     Status: None   Collection Time: 01/21/15  9:30 AM  Result Value Ref Range Status   Specimen Description BLOOD RIGHT ANTECUBITAL  Final   Special Requests BOTTLES DRAWN AEROBIC AND ANAEROBIC 10CC  Final   Culture   Final    NO GROWTH 5 DAYS Performed at [facility]    Report  Status 01/27/2015 FINAL  Final  Culture, blood (routine x 2)     Status: None   Collection Time: 01/21/15  9:40 AM  Result Value Ref Range Status   Specimen Description BLOOD RIGHT ARM  Final   Special Requests BOTTLES DRAWN AEROBIC ONLY 5CC  Final   Culture   Final    NO GROWTH 5 DAYS Performed at [facility]    Report Status 01/27/2015 FINAL  Final  Culture, respiratory (NON-Expectorated)     Status: None   Collection Time: 01/22/15 12:20 PM  Result Value Ref Range Status   Specimen Description TRACHEAL ASPIRATE  Final   Special Requests NONE  Final   Gram Stain   Final    FEW WBC PRESENT,BOTH PMN AND MONONUCLEAR NO SQUAMOUS EPITHELIAL CELLS SEEN NO ORGANISMS SEEN Performed at [facility]    Culture   Final    Non-Pathogenic Oropharyngeal-type Flora Isolated. Performed at [facility]    Report Status 01/24/2015 FINAL  Final    Anti-infectives    Start     Dose/Rate Route Frequency Ordered Stop   01/22/15 0000  imipenem-cilastatin (PRIMAXIN) 250 mg in sodium chloride 0.9 % 100 mL IVPB     250 mg 200 mL/hr over 30 Minutes Intravenous 4 times per day 01/21/15 1947     01/21/15 1400  imipenem-cilastatin (PRIMAXIN) 500 mg in sodium chloride 0.9 % 100 mL IVPB  Status:  Discontinued  500 mg 200 mL/hr over 30 Minutes Intravenous 3 times per day 01/21/15 1059 01/21/15 1947   01/21/15 1000  vancomycin (VANCOCIN) 1,250 mg in sodium chloride 0.9 % 250 mL IVPB  Status:  Discontinued     1,250 mg 166.7 mL/hr over 90 Minutes Intravenous Every 24 hours 01/21/15 0859 01/24/15 0853   01/21/15 0900  ceFEPIme (MAXIPIME) 2 g in dextrose 5 % 50 mL IVPB  Status:  Discontinued     2 g 100 mL/hr over 30 Minutes Intravenous Every 24 hours 01/21/15 0859 01/21/15 1049   01/18/15 0800  piperacillin-tazobactam (ZOSYN) IVPB 3.375 g  Status:  Discontinued     3.375 g 12.5 mL/hr over 240 Minutes Intravenous Every 8 hours 01/17/15 2237 01/18/15 1454   01/17/15 2245   piperacillin-tazobactam (ZOSYN) IVPB 3.375 g     3.375 g 100 mL/hr over 30 Minutes Intravenous STAT 01/17/15 2236 01/17/15 2323      Assessment: 74yo male continuing on impenem (D7) in the setting of acute pancreatitis and suspected HCAP. Afeb, wbc 13.9 (decr), lipase/amylase trend down. Patient on borderline for dose increase - CrCl~40, wt>70kg. To continue to monitor and adjust as necessary. Cultures negative to date  Imipenem 5/16 >>  Vancomycin 5/16 >> 5/19  Cefepime 5/16 >> 5/16  Zosyn 5/12 >> 5/13  5/14 C-diff - neg  5/16 blood x 2 >>NGF  5/17 Resp >> NGF  Plan:  - Continue Imipenem 250 mg IV Q6h - Monitor renal function and adjust imipenem dose as necessary - F/u cultures, clinical progress - F/u abx de-esc plan/length of therapy  Babs Bertin, PharmD Clinical Pharmacist - Resident Pager [phone] 01/27/2015 12:39 PM

## 2015-01-27 NOTE — Progress Notes (Signed)
[place] TEAM 1 - Stepdown/ICU TEAM Progress Note  [name] [name] SKA:[id number] DOB: [birth year] DOA: 01/13/2015 PCP: Monico Blitz, MD  Admit HPI / Brief Narrative: 74 yo WM PMHx HTN and DM Type 2 Presented to [facility] ED after a syncopal episode. He had been having persistent epigastric abdominal pain with associated nausea. The pain progressively got worse, and up to an 8/10 when he passed out. EMS noted he was bradycardic in the 30s with SBP documented in the mid 60s.He had previously been told he has a gallbladder full of stones but had been asymptomatic w/ no h/o pancreatitis.   In ED at [facility] showed CBD stone and pancreatitis. Lip was >300 (no actual level). AST/ALT 247/212 - tbili 1.9 - alk phos normal. Lactic acid level 2.2 . Creat 1.7. The pt was transferred to [facility].    HPI/Subjective: 5/22 A/O 4, states negative abdominal pain, negative N/V post-prandial,, negative CP, negative SOB, positive continued Fatigue   Assessment/Plan: Acute Gallstone pancreatitis  -Plan was initially for ERCP, however secondary to patient's possible NSTEMI will proceed today with MRCP instead. -KUB shows possible small ileus : Resolving, patient able to take  PO nutrition without significant abdominal pain, N/V. -Continue Primaxin which was started on 5/16; complete 7 to10-day course - Lipase is now WNL -LE continue to trend down  Leukocytosis -Still elevated but trending down, negative Left shift, afebrile; continue to monitor closely      Syncope and collapse -Most likely multifactorial to include vagal, bradycardia, NSTEMI,and systolic CHF  -Resolved  -Continue management per cardiology  Bradycardia  -See syncope and collapse  NSTEMI/Severe 3 vessel CAD/Ischemic cardiomyopathy -Per cardiology attempting to stabilize patient for CABG;   -Strict in and out + 2.62 L -Daily weight -Waiting for PT/OT/Rehab consult to recommend CIR which would be the best choice for  this Gentleman. If not accepted to [facility] cardiology prefers [facility]   Acute systolic congestive heart failure -Continue Coreg 3.125 mg BID -Transfuse for Hg < 8  HTN/Hypotension  -See CHF    Acute kidney injury?  vs   chronic kidney failure -Unable to determine patient's baseline kidney function through review of EMR.  -Cr continues trend down.  DM Type 2 uncontrolled -5/8 hemoglobin A1c = 7.5  -Continue Lantus 5 units QHS -Continue resistant SSI   Hypocalcemia -5/21 Corrected calcium= 9.2   Code Status: FULL Family Communication:  No family present at time of exam Disposition Plan: - CIR or [facility]    Consultants: Dr.Peter Prescott Gum (cardiothoracic surgery) Dr.John Delice Lesch (GI) Dr.David Loren Racer (Cardiology)   Procedure/Significant Events: TTE - 5/9 - EF 30-40% - akinesis of basal-midinferior myocardium - severe hypokinesis lateral myocardium - grade 1 DD 5/10 KUB;Mild dilatation of small bowel likely representing a small-bowel ileus. 5/12 cardiac catheterization; Severe native vessel coronary disease with total occlusion of the RCA/proximal circumflex,-severe proximal LAD disease. -Dominant diagonal contains segmental 50% narrowing. -left ventricle significant systolic impairment; -OMBT=59% with inferior wall severe hypokinesis. 5/19 placement of retrievable infrarenal IVC filter     Culture 5/18 MRSA by PCR negative 5/14 C. difficile by PCR negative 5/16 blood right antecubital/right arm NGTD 5/17 trach aspirate negative    Antibiotics: Zosyn 5/12>> stopped 5/13 Vancomycin 5/16>> stopped 5/19 Primaxin 5/16>>  DVT prophylaxis: IV heparin    Devices    LINES / TUBES:      Continuous Infusions: . sodium chloride Stopped (01/22/15 1700)    Objective: VITAL SIGNS: Temp: 99.4 F (37.4 C) (  05/22 0749) Temp Source: Oral (05/22 0749) BP: 113/48 mmHg (05/22 0749) Pulse Rate: 82 (05/22 0749) SPO2; FIO2:   Intake/Output  Summary (Last 24 hours) at 01/27/15 0819 Last data filed at 01/27/15 0500  Gross per 24 hour  Intake    880 ml  Output    920 ml  Net    -40 ml     Exam: General: A/O 4, NAD, No acute respiratory distress Eyes: Negative eye pain, double vision, scotomas, floaters, negative retinal hemorrhage ENT: Negative Runny nose, negative ear pain, negative tinnitus,  Neck:  Negative scars, masses, torticollis, lymphadenopathy, JVD Skin ; negative rash, lesions, wounds, right IJ site clean and covered negative sign of infection  Lungs: Clear to auscultation bilaterally without wheezes or crackles Cardiovascular: Regular rate and rhythm without murmur gallop or rub normal S1 and S2 Abdomen: Negative tenderness, nondistended, soft, bowel sounds positive, no rebound, no ascites, no appreciable mass Extremities: No significant cyanosis, clubbing, or edema bilateral lower extremities Psychiatric:  Negative depression, negative anxiety, negative fatigue, negative mania  Neurologic:  Cranial nerves II through XII intact, negative dysarthria, negative expressive aphasia, negative receptive aphasia.     Data Reviewed: Basic Metabolic Panel:  Recent Labs Lab 01/21/15 0005  01/22/15 0449  01/23/15 2000 01/24/15 0500 01/25/15 0440 01/26/15 0410 01/27/15 0400  NA 138  < > 142  < > 144 145 145 148* 147*  K 3.2*  < > 4.4  < > 3.8 4.0 3.9 3.6 3.6  CL 106  < > 117*  < > 118* 116* 117* 116* 117*  CO2 24  < > 18*  < > 20* 21* 22 24 23   GLUCOSE 303*  < > 165*  < > 282* 197* 181* 239* 146*  BUN 60*  < > 71*  < > 47* 47* 38* 31* 26*  CREATININE 1.71*  < > 1.92*  < > 1.89* 2.11* 2.06* 1.85* 1.80*  CALCIUM 6.4*  < > 6.1*  < > 6.3* 7.1* 7.1* 6.9* 6.7*  MG 1.8  --  1.7  --   --   --   --   --  1.8  PHOS 3.3  --  3.0  --   --   --   --   --   --   < > = values in this interval not displayed. Liver Function Tests:  Recent Labs Lab 01/23/15 0447 01/24/15 1400 01/25/15 0440 01/26/15 0410 01/27/15 0400    AST 94* 146* 149* 149* 139*  ALT 63 90* 96* 104* 94*  ALKPHOS 66 62 72 85 74  BILITOT 1.1 1.1 1.7* 1.5* 1.4*  PROT 4.3* 4.5* 5.1* 4.9* 5.1*  ALBUMIN 1.3* 1.3* 1.4* 1.3* 1.3*    Recent Labs Lab 01/21/15 0930 01/24/15 1400 01/25/15 0440 01/27/15 0400  LIPASE 22 13* 16* 15*  AMYLASE  --  45  --   --    No results for input(s): AMMONIA in the last 168 hours. CBC:  Recent Labs Lab 01/21/15 0930  01/23/15 0447 01/24/15 0500 01/24/15 1832 01/25/15 0440 01/26/15 0410 01/27/15 0400  WBC 33.6*  < > 25.2* 19.6*  --  18.2* 16.2* 13.9*  NEUTROABS 29.2*  --   --   --   --   --   --  11.8*  HGB 6.8*  < > 7.4* 7.7* 9.4* 9.1* 9.4* 8.9*  HCT 19.7*  < > 22.0* 22.9* 27.4* 27.1* 27.9* 27.3*  MCV 89.5  < > 88.7 88.4  --  88.3  89.7 90.7  PLT 143*  < > 134* 154  --  171 178 175  < > = values in this interval not displayed. Cardiac Enzymes:  Recent Labs Lab 01/21/15 0930 01/21/15 1840 01/21/15 2354  TROPONINI 0.86* 3.70* 5.81*   BNP (last 3 results) No results for input(s): BNP in the last 8760 hours.  ProBNP (last 3 results) No results for input(s): PROBNP in the last 8760 hours.  CBG:  Recent Labs Lab 01/26/15 1142 01/26/15 1635 01/26/15 2005 01/26/15 2348 01/27/15 0343  GLUCAP 186* 210* 197* 145* 134*    Recent Results (from the past 240 hour(s))  Clostridium Difficile by PCR     Status: None   Collection Time: 01/19/15  3:12 PM  Result Value Ref Range Status   C difficile by pcr NEGATIVE NEGATIVE Final  Culture, blood (routine x 2)     Status: None (Preliminary result)   Collection Time: 01/21/15  9:30 AM  Result Value Ref Range Status   Specimen Description BLOOD RIGHT ANTECUBITAL  Final   Special Requests Immunocompromised BAA 10CC EACH  Final   Culture   Final           BLOOD CULTURE RECEIVED NO GROWTH TO DATE CULTURE WILL BE HELD FOR 5 DAYS BEFORE ISSUING A FINAL NEGATIVE REPORT Performed at [facility]    Report Status PENDING  Incomplete   Culture, blood (routine x 2)     Status: None (Preliminary result)   Collection Time: 01/21/15  9:40 AM  Result Value Ref Range Status   Specimen Description BLOOD RIGHT ARM  Final   Special Requests Immunocompromised AEB 5CC  Final   Culture   Final           BLOOD CULTURE RECEIVED NO GROWTH TO DATE CULTURE WILL BE HELD FOR 5 DAYS BEFORE ISSUING A FINAL NEGATIVE REPORT Performed at [facility]    Report Status PENDING  Incomplete  Culture, respiratory (NON-Expectorated)     Status: None   Collection Time: 01/22/15 12:20 PM  Result Value Ref Range Status   Specimen Description TRACHEAL ASPIRATE  Final   Special Requests NONE  Final   Gram Stain   Final    FEW WBC PRESENT,BOTH PMN AND MONONUCLEAR NO SQUAMOUS EPITHELIAL CELLS SEEN NO ORGANISMS SEEN Performed at [facility]    Culture   Final    Non-Pathogenic Oropharyngeal-type Flora Isolated. Performed at [facility]    Report Status 01/24/2015 FINAL  Final     Studies:  Recent x-ray studies have been reviewed in detail by the Attending Physician  Scheduled Meds:  Scheduled Meds: . sodium chloride   Intravenous Once  . carvedilol  3.125 mg Oral BID WC  . enoxaparin (LOVENOX) injection  40 mg Subcutaneous Q24H  . feeding supplement (RESOURCE BREEZE)  1 Container Oral TID BM  . imipenem-cilastatin  250 mg Intravenous 4 times per day  . insulin aspart  0-20 Units Subcutaneous 6 times per day  . insulin glargine  5 Units Subcutaneous QHS  . latanoprost  1 drop Both Eyes QHS  . metoCLOPramide (REGLAN) injection  5 mg Intravenous 3 times per day  . pantoprazole (PROTONIX) IV  40 mg Intravenous Q12H  . ranolazine  500 mg Oral BID    Time spent on care of this patient: 40 mins   WOODS, Geraldo Docker , MD  [facility] Office  [phone] Pager - [phone]  On-Call/Text Page:      [email or web address]  password [email or web address]  If 7PM-7AM, please contact night-coverage [email or web address] Password  [email or web address] 01/27/2015, 8:19 AM   LOS: 14 days   Care during the described time interval was provided by me .  I have reviewed this patient's available data, including medical history, events of note, physical examination, and all test results as part of my evaluation. I have personally reviewed and interpreted all radiology studies.   Dia Crawford, MD [phone] Pager

## 2015-01-28 LAB — URINE MICROSCOPIC-ADD ON

## 2015-01-28 LAB — CBC
HEMATOCRIT: 29.5 % — AB (ref 39.0–52.0)
HEMOGLOBIN: 9.3 g/dL — AB (ref 13.0–17.0)
MCH: 28.9 pg (ref 26.0–34.0)
MCHC: 31.5 g/dL (ref 30.0–36.0)
MCV: 91.6 fL (ref 78.0–100.0)
PLATELETS: 216 10*3/uL (ref 150–400)
RBC: 3.22 MIL/uL — ABNORMAL LOW (ref 4.22–5.81)
RDW: 14.4 % (ref 11.5–15.5)
WBC: 12.7 10*3/uL — ABNORMAL HIGH (ref 4.0–10.5)

## 2015-01-28 LAB — CBC WITH DIFFERENTIAL/PLATELET
BASOS ABS: 0 10*3/uL (ref 0.0–0.1)
Basophils Relative: 0 % (ref 0–1)
Eosinophils Absolute: 0.2 10*3/uL (ref 0.0–0.7)
Eosinophils Relative: 2 % (ref 0–5)
HCT: 26.5 % — ABNORMAL LOW (ref 39.0–52.0)
Hemoglobin: 8.5 g/dL — ABNORMAL LOW (ref 13.0–17.0)
LYMPHS ABS: 1.2 10*3/uL (ref 0.7–4.0)
Lymphocytes Relative: 11 % — ABNORMAL LOW (ref 12–46)
MCH: 29.2 pg (ref 26.0–34.0)
MCHC: 32.1 g/dL (ref 30.0–36.0)
MCV: 91.1 fL (ref 78.0–100.0)
MONOS PCT: 6 % (ref 3–12)
Monocytes Absolute: 0.6 10*3/uL (ref 0.1–1.0)
NEUTROS ABS: 9 10*3/uL — AB (ref 1.7–7.7)
Neutrophils Relative %: 81 % — ABNORMAL HIGH (ref 43–77)
Platelets: 181 10*3/uL (ref 150–400)
RBC: 2.91 MIL/uL — ABNORMAL LOW (ref 4.22–5.81)
RDW: 14.6 % (ref 11.5–15.5)
WBC: 10.9 10*3/uL — ABNORMAL HIGH (ref 4.0–10.5)

## 2015-01-28 LAB — URINALYSIS, ROUTINE W REFLEX MICROSCOPIC
Glucose, UA: 100 mg/dL — AB
HGB URINE DIPSTICK: NEGATIVE
Ketones, ur: NEGATIVE mg/dL
Leukocytes, UA: NEGATIVE
NITRITE: NEGATIVE
PH: 5 (ref 5.0–8.0)
PROTEIN: 30 mg/dL — AB
SPECIFIC GRAVITY, URINE: 1.023 (ref 1.005–1.030)
UROBILINOGEN UA: 1 mg/dL (ref 0.0–1.0)

## 2015-01-28 LAB — COMPREHENSIVE METABOLIC PANEL
ALBUMIN: 1.3 g/dL — AB (ref 3.5–5.0)
ALT: 73 U/L — ABNORMAL HIGH (ref 17–63)
ANION GAP: 6 (ref 5–15)
AST: 95 U/L — ABNORMAL HIGH (ref 15–41)
Alkaline Phosphatase: 61 U/L (ref 38–126)
BUN: 27 mg/dL — ABNORMAL HIGH (ref 6–20)
CALCIUM: 6.7 mg/dL — AB (ref 8.9–10.3)
CHLORIDE: 116 mmol/L — AB (ref 101–111)
CO2: 23 mmol/L (ref 22–32)
Creatinine, Ser: 1.79 mg/dL — ABNORMAL HIGH (ref 0.61–1.24)
GFR calc non Af Amer: 36 mL/min — ABNORMAL LOW (ref >60)
GFR, EST AFRICAN AMERICAN: 42 mL/min — AB (ref >60)
GLUCOSE: 127 mg/dL — AB (ref 65–99)
POTASSIUM: 3.8 mmol/L (ref 3.5–5.1)
SODIUM: 145 mmol/L (ref 135–145)
TOTAL PROTEIN: 4.6 g/dL — AB (ref 6.5–8.1)
Total Bilirubin: 1.3 mg/dL — ABNORMAL HIGH (ref 0.3–1.2)

## 2015-01-28 LAB — GLUCOSE, CAPILLARY
Glucose-Capillary: 225 mg/dL — ABNORMAL HIGH (ref 65–99)
Glucose-Capillary: 208 mg/dL — ABNORMAL HIGH (ref 65–99)
Glucose-Capillary: 160 mg/dL — ABNORMAL HIGH (ref 65–99)
Glucose-Capillary: 234 mg/dL — ABNORMAL HIGH (ref 65–99)

## 2015-01-28 LAB — MAGNESIUM: Magnesium: 2.1 mg/dL (ref 1.7–2.4)

## 2015-01-28 MED ORDER — INSULIN ASPART 100 UNIT/ML ~~LOC~~ SOLN
0.0000 [IU] | Freq: Three times a day (TID) | SUBCUTANEOUS | Status: DC
  Administered 2015-01-29 (×2): 4 [IU] via SUBCUTANEOUS
  Administered 2015-01-29: 15 [IU] via SUBCUTANEOUS
  Administered 2015-01-30: 11 [IU] via SUBCUTANEOUS
  Administered 2015-01-30: 3 [IU] via SUBCUTANEOUS
  Administered 2015-01-30: 7 [IU] via SUBCUTANEOUS
  Administered 2015-01-31 (×2): 3 [IU] via SUBCUTANEOUS
  Administered 2015-01-31 – 2015-02-01 (×2): 4 [IU] via SUBCUTANEOUS
  Administered 2015-02-01 (×2): 7 [IU] via SUBCUTANEOUS
  Administered 2015-02-02 (×3): 4 [IU] via SUBCUTANEOUS
  Administered 2015-02-03 (×2): 7 [IU] via SUBCUTANEOUS
  Administered 2015-02-04: 3 [IU] via SUBCUTANEOUS
  Administered 2015-02-04 (×2): 4 [IU] via SUBCUTANEOUS
  Administered 2015-02-05: 7 [IU] via SUBCUTANEOUS
  Administered 2015-02-05: 4 [IU] via SUBCUTANEOUS
  Administered 2015-02-05: 3 [IU] via SUBCUTANEOUS
  Administered 2015-02-06: 7 [IU] via SUBCUTANEOUS
  Administered 2015-02-06 (×2): 4 [IU] via SUBCUTANEOUS
  Administered 2015-02-07: 7 [IU] via SUBCUTANEOUS
  Administered 2015-02-07: 4 [IU] via SUBCUTANEOUS
  Administered 2015-02-07: 7 [IU] via SUBCUTANEOUS
  Administered 2015-02-08: 4 [IU] via SUBCUTANEOUS
  Administered 2015-02-08: 3 [IU] via SUBCUTANEOUS
  Administered 2015-02-09 – 2015-02-11 (×5): 4 [IU] via SUBCUTANEOUS
  Administered 2015-02-12: 3 [IU] via SUBCUTANEOUS
  Administered 2015-02-12: 4 [IU] via SUBCUTANEOUS
  Administered 2015-02-12 – 2015-02-13 (×2): 3 [IU] via SUBCUTANEOUS
  Administered 2015-02-13: 4 [IU] via SUBCUTANEOUS
  Administered 2015-02-14: 3 [IU] via SUBCUTANEOUS

## 2015-01-28 MED ORDER — FUROSEMIDE 40 MG PO TABS
40.0000 mg | ORAL_TABLET | Freq: Every day | ORAL | Status: DC
  Filled 2015-01-28: qty 1

## 2015-01-28 MED ORDER — ACETAMINOPHEN 325 MG PO TABS: 650.0000 mg | ORAL_TABLET | ORAL | Status: DC | PRN

## 2015-01-28 MED ORDER — SODIUM CHLORIDE 0.9 % IV BOLUS (SEPSIS)
250.0000 mL | Freq: Once | INTRAVENOUS | Status: AC
  Administered 2015-01-28: 250 mL via INTRAVENOUS

## 2015-01-28 MED ORDER — ENSURE ENLIVE PO LIQD
237.0000 mL | Freq: Three times a day (TID) | ORAL | Status: DC
  Administered 2015-01-28 – 2015-02-05 (×21): 237 mL via ORAL

## 2015-01-28 NOTE — Progress Notes (Signed)
[place] TEAM 1 - Stepdown/ICU TEAM Progress Note  [name] [name] YTW:[id number] DOB: 19-Jun-1941 DOA: 01/13/2015 PCP: Monico Blitz, MD  Admit HPI / Brief Narrative: 74 yo male h/o HTN and DM who presented to [facility] ED after a syncopal episode. He had been having persistent epigastric pain with associated nausea. The pain progressively got worse, and up to an 8/10 when he passed out. EMS noted he was bradycardic in the 30s with SBP documented in the mid 60s.He had previously been told he has a gallbladder full of stones but had been asymptomatic w/ no h/o pancreatitis.   In ED at [facility] showed cbd stone and pancreatitis. Lip was >300 (no actual level). AST/ALT 247/212 - tbili 1.9 - alk phos normal. Lactic acid level 2.2 . Creat 1.7. The pt was transferred to [facility].   Significant Events: 5/08 Transfer from [facility] after syncopal episode, gallstones, pancreatitis, elevated troponin I, abnormal echocardiogram 5/09 TTE: EF 30-40%, akinesis of basal-midinferior myocardium - severe hypokinesis lateral myocardium - grade 1 DD 5/12 LHC: severe 3 vessel disease  5/13 CT ABD/Pelvis: infiltration edema around pancreas, c/w acute pancreatitis, no abscess, cholelithiasis with stones in CBD, bilateral pleural effusions with compressive atx. 5/15 transfused one unit PRBCs 5/16 Transferred to ICU with acute respiratory distress. Intubated. PCCM consultation. CXR revealed BLL consolidation 5/16 transfused one unit PRBCs 5/16 EGD: grade 4 esophagitis with deep ulcers, eschar, friability, blood clllots in stomach, no gastric ulcers or mucosal abnormalities, mod edema of 2nd portion of duodenum c/w pancreatitis. PPI infusion initiated. Heparin disconitnued. OGT left out 5/17 transfused one unit PRBCs for Hgb 7.4 in setting of ACS 5/18 CXR with improved basilar aeration. Extubated 5/19 LE venous duplex scans + B LE DVT 5/19 IVC filter placed   HPI/Subjective: The patient had some mild  nausea with a single episode of vomiting this morning.  He states he is otherwise tolerating his food well.  He states he is moving his bowels and is not having diarrhea.  He denies abdominal pain chest pain or shortness of breath at this time.  He denies headache.  Assessment/Plan:  Hemorrhagic Shock / Acute Blood Loss Anemia Due to reflux related erosive esophagitis plus anticoagulation - resolved - S/p 5 units of PRBC - hemoglobin appears to be consistently drifting downward - recheck CBC tonight and again in a.m. - transfuse as needed to keep hemoglobin 8.0 or greater  Erosive Esophagitis GI signed off 5/18 - initial PPI drip titrated to PPI twice a day indefinitely  Acute Gallstone pancreatitis  MRCP suggested possible 4 mm stone within the common bile duct (though images not ideal ) - GI followed - lipase normalized - LFTs normal - total bili is slowly improving - no GI interventions planned  NSTEMI - Severe 3 vessel CAD Cardiac cath revealed total RCA, total prox cfx, severe prox LAD, 50% dz of dominant diagonal - Cards following - will need eventual CABG - TCTS following - patient will need to fully recover from his severe acute pancreatitis prior to surgery - goal of CIR prior to surgery not possible as insurance will not cover   Acute B LE DVTs Now has IVC filter - no anticoag currently - follow Hgb trend and hold on starting full anticoag again until Hgb proves to be stable   SB ileus  Pt having intermittent difficulty with nausea/vomiting and abdominal distention - ambulation is key to assist with GI motility - reassured family that these difficulties are not surprising given his recent acute  GI issues  Acute kidney injury  renal ultrasound unrevealing - avoid all potential nephrotoxins - creatinine appears to have stabilized suggesting his new baseline creatinine will be approximately 1.6 - continue to follow  Hypocalcemia Follow without change today  Persistent  Hypokalemia Corrected with supplementation  Hypophosphatemia Recheck in a.m.  Syncope  Initially felt to be Vagal due to pain - no recurrence since admission   Bradycardia  Initially felt to be Vagal - has not recurred while inpatient   SVT  Responded to adenosine given at bedside with restoration of sinus rhythm - normal sinus rhythm at present   Newly diagnosed systolic congestive heart failure No ACE inhibitor due to acute kidney injury - on beta blocker - appears to be euvolemic at present - Cardiology following  Leukocytosis  Felt to be due to inflammation of pancreatitis - no other sx presently to suggest infection presently - empiric antibiotics have not impacted his white count of late but do appear to have been associated with a significant reduction initially  Thrombocytopenia  HIT panel negative - suspect due to acute inflam illness - no evidence of acute blood loss - platelet count has normalized  Hypotension > hypertension Hypotension resolved with volume expansion - blood pressure controlled  DM CBG poorly controlled but variable - follow without change today   Nutrition Is tolerating his current diet without any difficulty - encouraged ongoing judicious oral intake  Code Status: FULL Family Communication:  Spoke with 2 daughters in waiting room with patient permission at great length Disposition Plan: Insurance will not cover CIR - PT/OT suggest patient does not require SNF level  Consultants: GI [facility] Cardiology  TCTS  Antibiotics: Zosyn 5/12 > 5/13 Vancomycin 5/16 > 5/19 Primaxin 5/16 >  DVT prophylaxis: Lovenox  Objective: Blood pressure 98/51, pulse 80, temperature 99 F (37.2 C), temperature source Oral, resp. rate 26, height 6' (1.829 m), weight 85.2 kg (187 lb 13.3 oz), SpO2 96 %.  Intake/Output Summary (Last 24 hours) at 01/28/15 1458 Last data filed at 01/28/15 0800  Gross per 24 hour  Intake    700 ml  Output    700 ml   Net      0 ml   Exam: General: A/O 4, NAD, No acute respiratory distress Neck:  No JVD Lungs: Clear to auscultation bilaterally without wheezes or crackles Cardiovascular: Regular rate and rhythm without murmur gallop or rub normal S1 and S2 Abdomen: Negative tenderness, nondistended, soft, bowel sounds positive, no rebound, no ascites, no appreciable mass Extremities: No significant cyanosis, clubbing, or edema bilateral lower extremities  Data Reviewed: Basic Metabolic Panel:  Recent Labs Lab 01/22/15 0449  01/24/15 0500 01/25/15 0440 01/26/15 0410 01/27/15 0400 01/28/15 0320  NA 142  < > 145 145 148* 147* 145  K 4.4  < > 4.0 3.9 3.6 3.6 3.8  CL 117*  < > 116* 117* 116* 117* 116*  CO2 18*  < > 21* 22 24 23 23   GLUCOSE 165*  < > 197* 181* 239* 146* 127*  BUN 71*  < > 47* 38* 31* 26* 27*  CREATININE 1.92*  < > 2.11* 2.06* 1.85* 1.80* 1.79*  CALCIUM 6.1*  < > 7.1* 7.1* 6.9* 6.7* 6.7*  MG 1.7  --   --   --   --  1.8 2.1  PHOS 3.0  --   --   --   --   --   --   < > = values in  this interval not displayed.   Liver Function Tests:  Recent Labs Lab 01/24/15 1400 01/25/15 0440 01/26/15 0410 01/27/15 0400 01/28/15 0320  AST 146* 149* 149* 139* 95*  ALT 90* 96* 104* 94* 73*  ALKPHOS 62 72 85 74 61  BILITOT 1.1 1.7* 1.5* 1.4* 1.3*  PROT 4.5* 5.1* 4.9* 5.1* 4.6*  ALBUMIN 1.3* 1.4* 1.3* 1.3* 1.3*    Recent Labs Lab 01/24/15 1400 01/25/15 0440 01/27/15 0400  LIPASE 13* 16* 15*  AMYLASE 45  --   --    CBC:  Recent Labs Lab 01/24/15 0500 01/24/15 1832 01/25/15 0440 01/26/15 0410 01/27/15 0400 01/28/15 0320  WBC 19.6*  --  18.2* 16.2* 13.9* 10.9*  NEUTROABS  --   --   --   --  11.8* 9.0*  HGB 7.7* 9.4* 9.1* 9.4* 8.9* 8.5*  HCT 22.9* 27.4* 27.1* 27.9* 27.3* 26.5*  MCV 88.4  --  88.3 89.7 90.7 91.1  PLT 154  --  171 178 175 181   Cardiac Enzymes:  Recent Labs Lab 01/21/15 1840 01/21/15 2354  TROPONINI 3.70* 5.81*   CBG:  Recent Labs Lab  01/27/15 0753 01/27/15 1239 01/27/15 1813 01/27/15 1957 01/28/15 1113  GLUCAP 137* 212* 225* 208* 160*    Recent Results (from the past 240 hour(s))  Clostridium Difficile by PCR     Status: None   Collection Time: 01/19/15  3:12 PM  Result Value Ref Range Status   C difficile by pcr NEGATIVE NEGATIVE Final  Culture, blood (routine x 2)     Status: None   Collection Time: 01/21/15  9:30 AM  Result Value Ref Range Status   Specimen Description BLOOD RIGHT ANTECUBITAL  Final   Special Requests BOTTLES DRAWN AEROBIC AND ANAEROBIC 10CC  Final   Culture   Final    NO GROWTH 5 DAYS Performed at [facility]    Report Status 01/27/2015 FINAL  Final  Culture, blood (routine x 2)     Status: None   Collection Time: 01/21/15  9:40 AM  Result Value Ref Range Status   Specimen Description BLOOD RIGHT ARM  Final   Special Requests BOTTLES DRAWN AEROBIC ONLY 5CC  Final   Culture   Final    NO GROWTH 5 DAYS Performed at [facility]    Report Status 01/27/2015 FINAL  Final  Culture, respiratory (NON-Expectorated)     Status: None   Collection Time: 01/22/15 12:20 PM  Result Value Ref Range Status   Specimen Description TRACHEAL ASPIRATE  Final   Special Requests NONE  Final   Gram Stain   Final    FEW WBC PRESENT,BOTH PMN AND MONONUCLEAR NO SQUAMOUS EPITHELIAL CELLS SEEN NO ORGANISMS SEEN Performed at [facility]    Culture   Final    Non-Pathogenic Oropharyngeal-type Flora Isolated. Performed at [facility]    Report Status 01/24/2015 FINAL  Final     Studies:  Recent x-ray studies have been reviewed in detail by the Attending Physician  Scheduled Meds:  Scheduled Meds: . sodium chloride   Intravenous Once  . carvedilol  3.125 mg Oral BID WC  . enoxaparin (LOVENOX) injection  40 mg Subcutaneous Q24H  . feeding supplement (ENSURE ENLIVE)  237 mL Oral TID BM  . imipenem-cilastatin  250 mg Intravenous 4 times per day  . insulin aspart   0-20 Units Subcutaneous 6 times per day  . insulin glargine  5 Units Subcutaneous QHS  . latanoprost  1 drop  Both Eyes QHS  . metoCLOPramide (REGLAN) injection  5 mg Intravenous 3 times per day  . pantoprazole (PROTONIX) IV  40 mg Intravenous Q12H  . ranolazine  500 mg Oral BID    Time spent on care of this patient: 35 mins  Cherene Altes, MD [facility] For Consults/Admissions - Flow Manager - [phone] Office  [phone]  Contact MD directly via text page:      [email or web address]      password [facility]  01/28/2015, 2:58 PM   LOS: 15 days

## 2015-01-28 NOTE — Progress Notes (Signed)
SLP Cancellation Note  Unable to complete SLP evaluation/treatment secondary to pt currently off unit. Will continue to assess diet tolerance.  Coreyon Nicotra B. Adalis Gatti, MSP, CCC-SLP [phone]

## 2015-01-28 NOTE — Progress Notes (Signed)
Nutrition Follow-up  DOCUMENTATION CODES:  Not applicable  INTERVENTION:  Ensure Enlive (each supplement provides 350kcal and 20 grams of protein)  NUTRITION DIAGNOSIS:  Inadequate oral intake related to altered GI function as evidenced by meal completion < 25%.  Ongoing  GOAL:  Patient will meet greater than or equal to 90% of their needs  Unmet  MONITOR:  PO intake, Supplement acceptance, Labs, Weight trends, Skin, I & O's  REASON FOR ASSESSMENT:  NPO/Clear Liquid Diet    ASSESSMENT: Admitted 5/8 after a syncopal episode and found to have acute pancreatitis. Also positive troponin/NSTEMI  Transferred to ICU with acute respiratory distress and intubated. EDG shows grade 4 esophagitis with deep ulcers, eschar, friability. OGT left out. No plans for feeding tube.   Pt in with rounding MD team and being bathed at times of visits.   Pt s/p IVC filter placement (due to dvt and grade 4 stomach ulcers with bleeding) and MBSS.  Pt has now been advanced to a soft diet. Intake remains poor; 10-25%. Noted pt consumed very little of his breakfast tray (about 25%). Pt was ordered Resource Breeze supplements; RD will upgrade to Ensure [facility] for increased nutrient provision.   Discharge disposition is CIR vs home with home health, dependent on PT progress.   Labs reviewed. Cl: 116, BUN/Creat: 27/1.79, Calcium: 6.7,  Glucose: 127. CBGS: 137-225.   Height:  Ht Readings from Last 1 Encounters:  01/19/15 6' (1.829 m)    Weight:  Wt Readings from Last 1 Encounters:  01/25/15 187 lb 13.3 oz (85.2 kg)    Ideal Body Weight:  80.9 kg  Wt Readings from Last 10 Encounters:  01/25/15 187 lb 13.3 oz (85.2 kg)    BMI:  Body mass index is 25.47 kg/(m^2). Overweight  Estimated Nutritional Needs:  Kcal:  2050-2250  Protein:  100-110 grams  Fluid:  2.0-2.2 L  Skin:  Reviewed, no issues  Diet Order:  DIET SOFT Room service appropriate?: Yes; Fluid consistency::  Thin  EDUCATION NEEDS:  No education needs identified at this time   Intake/Output Summary (Last 24 hours) at 01/28/15 1042 Last data filed at 01/28/15 0800  Gross per 24 hour  Intake    920 ml  Output    700 ml  Net    220 ml    Last BM:  01/27/15  [name] [name] A. [name] [name], RD, LDN, CDE Pager: [phone] After hours Pager: [phone]

## 2015-01-28 NOTE — Progress Notes (Signed)
7 Days Post-Op Procedure(s) (LRB): ESOPHAGOGASTRODUODENOSCOPY (EGD) (N/A) Subjective: Events noted-no more GI bleeding Patient's oral intake has been suboptimal and albumin remains very low 1.3 Cardiac status has been stable without angina or signs of CHF Patient ambulated with physical therapy and rolling walker 70 feet White blood count has returned to normal patient remains on Primaxin Objective: Vital signs in last 24 hours: Temp:  [97.9 F (36.6 C)-99.2 F (37.3 C)] 99 F (37.2 C) (05/23 1115) Pulse Rate:  [80-91] 80 (05/23 1115) Cardiac Rhythm:  [-] Normal sinus rhythm (05/23 1115) Resp:  [19-30] 26 (05/23 1115) BP: (98-123)/(49-70) 98/51 mmHg (05/23 1115) SpO2:  [94 %-98 %] 96 % (05/23 1115)  Hemodynamic parameters for last 24 hours:  sinus  Intake/Output from previous day: 05/22 0701 - 05/23 0700 In: 1040 [P.O.:640; IV Piggyback:400] Out: 700 [Urine:700] Intake/Output this shift: Total I/O In: 240 [P.O.:240] Out: -     Lab Results:  Recent Labs  01/27/15 0400 01/28/15 0320  WBC 13.9* 10.9*  HGB 8.9* 8.5*  HCT 27.3* 26.5*  PLT 175 181   BMET:  Recent Labs  01/27/15 0400 01/28/15 0320  NA 147* 145  K 3.6 3.8  CL 117* 116*  CO2 23 23  GLUCOSE 146* 127*  BUN 26* 27*  CREATININE 1.80* 1.79*  CALCIUM 6.7* 6.7*    PT/INR: No results for input(s): LABPROT, INR in the last 72 hours. ABG    Component Value Date/Time   PHART 7.440 01/22/2015 0325   HCO3 17.6* 01/22/2015 0325   TCO2 18.4 01/22/2015 0325   ACIDBASEDEF 5.8* 01/22/2015 0325   O2SAT 58.4 01/23/2015 1105   CBG (last 3)   Recent Labs  01/27/15 1813 01/27/15 1957 01/28/15 1113  GLUCAP 225* 208* 160*    Assessment/Plan: S/P Procedure(s) (LRB): ESOPHAGOGASTRODUODENOSCOPY (EGD) (N/A) Patient not ready for CABG We'll check prealbumin tomorrow Continue physical therapy and nutritional support-previously patient's cardiologist felt that patient should not be discharged until he has  had CABG due to the severity of his CAD.Marland KitchenHe is not currently ready for CABG.will follow.  LOS: 15 days    Kathlee Nations Trigt III 01/28/2015

## 2015-01-28 NOTE — Progress Notes (Addendum)
Physical Therapy Treatment Patient Details Name: [name] [name] MRN: [id number] DOB: [birth year] Today's Date: 01/28/2015    History of Present Illness Admitted with epigastric pain significant; also with NSTEMI; Cardiac cath revealed total RCA, total prox cfx, severe prox LAD, 50% dz of dominant diagonal - Cards following - will need eventual CABG - TCTS now following - patient will need to fully recover from his severe acute pancreatitis prior to surgery. Increased WOB and GIB with pt transferred to ICU and intubated 5/16 to 5/18. Resident requesting bed to chair only on 5/19 until cleared by cardiology for ambulation.    PT Comments    Pt admitted with above diagnosis. Pt currently with functional limitations due to balance and endurance deficits.  Pt progressing with ambulation today however still with slow progress.  Family supportive and really wants Rehab prior to d/c home.  PT and OT agree given pts cognitive deficits and generalized deconditioning.  Does not meet criteria for inpt rehab therefore will recommend ST SNF for pt as pt needs to be more I prior to d/c home.   Met 0/5 goals set originally due to multiple medical issues.  Revised goals.  Will follow acutely.  Pt will benefit from skilled PT to increase their independence and safety with mobility to allow discharge to the venue listed below.    Follow Up Recommendations  SNF;Supervision/Assistance - 24 hour      Equipment Recommendations  3in1 (PT)    Recommendations for Other Services       Precautions / Restrictions Precautions Precautions: Fall Restrictions Weight Bearing Restrictions: No    Mobility  Bed Mobility Overal bed mobility: Needs Assistance Bed Mobility: Supine to Sit     Supine to sit: Min guard;HOB elevated     General bed mobility comments: used rail  Transfers Overall transfer level: Needs assistance Equipment used: Rolling walker (2 wheeled) Transfers: Sit to/from Stand Sit to Stand: Min  assist         General transfer comment: verbal cues for hand placement and sequence, min assist to rise and to steady.  slightly unsteady stepping feet around to chair.  Needed RW relying on UEs.    Ambulation/Gait Ambulation/Gait assistance: Min guard;Min assist;+2 safety/equipment Ambulation Distance (Feet): 70 Feet (35 feet x 2) Assistive device: Rolling walker (2 wheeled) Gait Pattern/deviations: Step-through pattern;Decreased stride length;Trunk flexed   Gait velocity interpretation: Below normal speed for age/gender General Gait Details: Pt needed encouragement to ambulate outside in hallway.  Pt able to get to wall in hallway and rest in chair.  Then walked back into room.  Cues for sequencing RW and steps.  Pt needed repetition with slow processing overall.     Stairs            Wheelchair Mobility    Modified Rankin (Stroke Patients Only)       Balance Overall balance assessment: Needs assistance;History of Falls Sitting-balance support: Feet supported;No upper extremity supported Sitting balance-Leahy Scale: Poor Sitting balance - Comments: can sit EOB with UE support bil.     Standing balance support: Bilateral upper extremity supported;During functional activity Standing balance-Leahy Scale: Poor Standing balance comment: Relies on RW for balance                    Cognition Arousal/Alertness: Awake/alert Behavior During Therapy: WFL for tasks assessed/performed Overall Cognitive Status: Impaired/Different from baseline Area of Impairment: Following commands;Problem solving;Memory     Memory: Decreased short-term memory Following Commands: Follows one step commands  with increased time (with multimodal cues, repetition)     Problem Solving: Slow processing;Decreased initiation;Requires verbal cues;Difficulty sequencing;Requires tactile cues General Comments: Pt requiring multiple repetition for hand placement with walker in same session.     Exercises General Exercises - Upper Extremity Shoulder Flexion: AROM;Both;Seated;5 reps Elbow Flexion: AROM;Strengthening;Both;5 reps;Seated Elbow Extension: AROM;Both;Seated;5 reps Digit Composite Flexion: AROM;Both;5 reps;Seated Composite Extension: AROM;Both;5 reps;Seated General Exercises - Lower Extremity Ankle Circles/Pumps: AROM;Both;10 reps;Seated Long Arc Quad: AROM;Both;10 reps;Seated Hip Flexion/Marching: AROM;Both;10 reps;Seated    General Comments        Pertinent Vitals/Pain Pain Assessment: No/denies pain  HR 88-90 bpm, 120/59 pre BP, 103/41 post BP.     Home Living                      Prior Function            PT Goals (current goals can now be found in the care plan section) Acute Rehab PT Goals Patient Stated Goal: to recover well enough to have his CABG surgery PT Goal Formulation: With patient Time For Goal Achievement: 02/11/15 Potential to Achieve Goals: Good Progress towards PT goals: Progressing toward goals    Frequency  Min 3X/week    PT Plan Discharge plan needs to be updated    Co-evaluation PT/OT/SLP Co-Evaluation/Treatment: Yes Reason for Co-Treatment: For patient/therapist safety PT goals addressed during session: Mobility/safety with mobility OT goals addressed during session: Strengthening/ROM     End of Session Equipment Utilized During Treatment: Gait belt Activity Tolerance: Patient tolerated treatment well Patient left: in chair;with call bell/phone within reach;with family/visitor present     Time: 1138-1201 PT Time Calculation (min) (ACUTE ONLY): 23 min  Charges:  $Gait Training: 8-22 mins                    G CodesDenice Paradise 24-Feb-2015, 1:49 PM [employer] [facility] [phone] (pager)

## 2015-01-28 NOTE — Progress Notes (Signed)
Rehab Admissions Coordinator Note:  Patient was screened by Clois Dupes for appropriateness for an Inpatient Acute Rehab Consult per PT and OT recommendations.   At this time, we are recommending Skilled Nursing Facility or home with [facility] for aarp medicare will not approve an inpt rehab admission for diagnosis of pancreatitis, NSTEMI, CAD requiring rehab prior to CABG. I have discussed with RN CM today.  Clois Dupes 01/28/2015, 2:31 PM  I can be reached at [phone].

## 2015-01-28 NOTE — Progress Notes (Signed)
Thank you for consult on Mr. [name] [name].  Chart reviewed and events of this weekend noted. PT/OT evaluations done on 05/19 and HH therapies recommended for follow up. Will follow at a distance as anticipate good progress as medical issues stabilize and mobility improves.

## 2015-01-28 NOTE — Progress Notes (Signed)
Subjective: Patient denies chest pain today. He has been having dark urine, which family is concerned about, also had one episode of vomiting yesterday- greenish, had a bowel movement yesterday- described as brown and watery. Patient also says he might be having some abdominal distension, he couldn't really eat breakfast this morning. Patient will like Boost or Ensure.  Objective: Vital signs in last 24 hours: Filed Vitals:   01/27/15 2356 01/28/15 0000 01/28/15 0400 01/28/15 0734  BP:  104/49 123/70 116/64  Pulse:  84 91 84  Temp: 99.2 F (37.3 C)  98.9 F (37.2 C) 98.9 F (37.2 C)  TempSrc: Oral  Oral Oral  Resp:  Height:      Weight:      SpO2:  96% 97% 94%   Weight change:   Intake/Output Summary (Last 24 hours) at 01/28/15 0915 Last data filed at 01/28/15 0434  Gross per 24 hour  Intake    920 ml  Output    700 ml  Net    220 ml   General appearance: Lying in bed Head: Normocephalic, without obvious abnormality, atraumatic Lungs: Clear Heart: S1 S2, no murmurs, rubs or gallops apprecaited.  Abdomen: Appears slightly distended, compared to prior, not tender, hypoactive bowel sounds.  Extremities: Wearing TED hose, +1 pitting pedal edema, DP pulse difficult to appreciate bilat. Urine bag- dark urine.   Tele- Sinus rhythm with PVCs.   Lab Results: Basic Metabolic Panel:  Recent Labs Lab 01/22/15 0449  01/27/15 0400 01/28/15 0320  NA 142  < > 147* 145  K 4.4  < > 3.6 3.8  CL 117*  < > 117* 116*  CO2 18*  < > 23 23  GLUCOSE 165*  < > 146* 127*  BUN 71*  < > 26* 27*  CREATININE 1.92*  < > 1.80* 1.79*  CALCIUM 6.1*  < > 6.7* 6.7*  MG 1.7  --  1.8 2.1  PHOS 3.0  --   --   --   < > = values in this interval not displayed. Liver Function Tests:  Recent Labs Lab 01/27/15 0400 01/28/15 0320  AST 139* 95*  ALT 94* 73*  ALKPHOS 74 61  BILITOT 1.4* 1.3*  PROT 5.1* 4.6*  ALBUMIN 1.3* 1.3*    Recent Labs Lab 01/24/15 1400 01/25/15 0440  01/27/15 0400  LIPASE 13* 16* 15*  AMYLASE 45  --   --    CBC:  Recent Labs Lab 01/27/15 0400 01/28/15 0320  WBC 13.9* 10.9*  NEUTROABS 11.8* 9.0*  HGB 8.9* 8.5*  HCT 27.3* 26.5*  MCV 90.7 91.1  PLT 175 181   Cardiac Enzymes:  Recent Labs Lab 01/21/15 0930 01/21/15 1840 01/21/15 2354  TROPONINI 0.86* 3.70* 5.81*   CBG:  Recent Labs Lab 01/26/15 2348 01/27/15 0343 01/27/15 0753 01/27/15 1239 01/27/15 1813 01/27/15 1957  GLUCAP 145* 134* 137* 212* 225* 208*   Coagulation:  Recent Labs Lab 01/21/15 0930 01/23/15 0447  LABPROT 18.5* 17.6*  INR 1.52* 1.43   Micro Results: Recent Results (from the past 240 hour(s))  Clostridium Difficile by PCR     Status: None   Collection Time: 01/19/15  3:12 PM  Result Value Ref Range Status   C difficile by pcr NEGATIVE NEGATIVE Final  Culture, blood (routine x 2)     Status: None   Collection Time: 01/21/15  9:30 AM  Result Value Ref Range Status   Specimen Description BLOOD RIGHT ANTECUBITAL  Final  Special Requests BOTTLES DRAWN AEROBIC AND ANAEROBIC 10CC  Final   Culture   Final    NO GROWTH 5 DAYS Performed at [facility]    Report Status 01/27/2015 FINAL  Final  Culture, blood (routine x 2)     Status: None   Collection Time: 01/21/15  9:40 AM  Result Value Ref Range Status   Specimen Description BLOOD RIGHT ARM  Final   Special Requests BOTTLES DRAWN AEROBIC ONLY 5CC  Final   Culture   Final    NO GROWTH 5 DAYS Performed at [facility]    Report Status 01/27/2015 FINAL  Final  Culture, respiratory (NON-Expectorated)     Status: None   Collection Time: 01/22/15 12:20 PM  Result Value Ref Range Status   Specimen Description TRACHEAL ASPIRATE  Final   Special Requests NONE  Final   Gram Stain   Final    FEW WBC PRESENT,BOTH PMN AND MONONUCLEAR NO SQUAMOUS EPITHELIAL CELLS SEEN NO ORGANISMS SEEN Performed at [facility]    Culture   Final    Non-Pathogenic  Oropharyngeal-type Flora Isolated. Performed at [facility]    Report Status 01/24/2015 FINAL  Final   Studies/Results: No results found. Medications: I have reviewed the patient's current medications. Scheduled Meds: . sodium chloride   Intravenous Once  . carvedilol  3.125 mg Oral BID WC  . enoxaparin (LOVENOX) injection  40 mg Subcutaneous Q24H  . feeding supplement (RESOURCE BREEZE)  1 Container Oral TID BM  . furosemide  40 mg Oral Daily  . imipenem-cilastatin  250 mg Intravenous 4 times per day  . insulin aspart  0-20 Units Subcutaneous 6 times per day  . insulin glargine  5 Units Subcutaneous QHS  . latanoprost  1 drop Both Eyes QHS  . metoCLOPramide (REGLAN) injection  5 mg Intravenous 3 times per day  . pantoprazole (PROTONIX) IV  40 mg Intravenous Q12H  . ranolazine  500 mg Oral BID   Continuous Infusions: . sodium chloride Stopped (01/22/15 1700)   PRN Meds:.acetaminophen, acetaminophen, bisacodyl, fentaNYL (SUBLIMAZE) injection, ondansetron (ZOFRAN) IV Assessment/Plan: Principal Problem:   Acute gallstone pancreatitis Active Problems:   Abdominal pain, epigastric   Nausea with vomiting   Elevated LFTs   Gallstones   Syncope   Hypotension   Bradycardia   AKI (acute kidney injury)   Diabetes   Pancreatitis   NSTEMI (non-ST elevated myocardial infarction)   Cardiomyopathy   Choledocholithiasis   Hypocalcemia   Ileus   Acute biliary pancreatitis   Leukocytosis   Syncope and collapse   Acute systolic congestive heart failure   Essential hypertension   Other specified hypotension   Acute kidney injury   Diabetes type 2, uncontrolled   CAD, multiple vessel: CTO of RCA, Cx with severe prox LAD disease   Calculus of gallbladder with acute cholecystitis without obstruction   Cholecystitis, acute   Metabolic acidosis   Acute systolic CHF (congestive heart failure)   Acute renal failure syndrome   Acute blood loss anemia   Hemorrhagic shock    Acute respiratory failure with hypoxemia   Septic shock   Ulcerative esophagitis   Hematemesis with nausea   Melena   Demand ischemia of myocardium   Erosive esophagitis   HCAP (healthcare-associated pneumonia)   Shock   DVT (deep venous thrombosis)   Cardiomyopathy, ischemic   Ischemic cardiomyopathy  1. Hemorrhagic Shock/Acute Blood Loss Anemia- resolved, S/p 5 units of PRBC. Hgb 8.5 today, down from 9.5  two days ago. No more bloody bowel movements. Restarted Lovenox- 40mg  daily.  - Per primary  2. CAD- Multi-vessel dx on Cath- 01/17/2015, planned for CABG when acute issues resolve.Severe native vessel coronary disease with total occlusion of the RCA, total occlusion of the proximal circumflex, and severe proximal LAD disease.  - CVTS Recs appreciated, awaiting resolution of acute medical problems, and improvement in physical status - Not on asa 81 - Conservative management for now, considering significant Co-morbities.  - Consideration for CIR stay prior to cabg.  - Cont carvedilol and ranexa.   3. Acute systolic HF: EF 30 - 40%.  - Volume status looks good. Now on low-dose carvedilol. -  No ACE/ARB due to renal failure.  - Consideration to Start low-dose hydral-nitrates, will hold for now. Ranolaxine with Imdur can be used together, and is considered safe.  4. Acute LE DVTs- Off systemic heparin due to acute severe GI bleed, so far requiring 5units of blood. Back on low-dose enoxaparin. Consider starting Anticaog when stable. - S/p IVC filter placement.   5. Acute Pancreatitis with Acute calculous cholecystitis- Per GI. Improving leukocytosis. Liver enzymes elevated but trending down. Lipase and amylase now normal. On primaxin - Per TRH  6. AKI on CKD- Improving, Cr- 1.789 today. Peak 2.28- 01/21/2015 - Will hold Lasix for now, pt able to lie flat, lower extremity swelling likely from DVT, can add as needed  7. Erosive Esophagitis- Appears stable. But HGB- dropped by 1 unit  over the past 2 days. Vomiting yesterday.  - management per primary  8. Diabetes- On SSI  - Glucerna Shakes.    Mariea Clonts, [place],MD PGY-2, IMTS- resident. 9:15 AM 01/28/2015  Attending Note:   The patient was seen and examined.  Agree with assessment and plan as noted above.  Changes made to the above note as needed.  Patient is not having any angina. Does not appear to be volume overloaded. Continue current meds.  Hope for CABG after rehab.   Vesta Mixer, Montez Hageman., MD, [facility] 01/28/2015, 10:37 AM [address]  [address] Office [phone] Pager [phone]

## 2015-01-28 NOTE — Progress Notes (Addendum)
Occupational Therapy Treatment Patient Details Name: [name] [name] MRN: [id number] DOB: 05-Nov-1940 Today's Date: 01/28/2015    History of present illness Admitted with epigastric pain significant; also with NSTEMI; Cardiac cath revealed total RCA, total prox cfx, severe prox LAD, 50% dz of dominant diagonal - Cards following - will need eventual CABG - TCTS now following - patient will need to fully recover from his severe acute pancreatitis prior to surgery. Increased WOB and GIB with pt transferred to ICU and intubated 5/16 to 5/18. Resident requesting bed to chair only on 5/19 until cleared by cardiology for ambulation.   OT comments  Pt readily willing to get OOB with therapy. Complaining of feeling weak and educated pt on the importance of ambulation with assist of staff, exercise and participation in ADL in order to regain strength and endurance.  Pt ambulated for the first time today with RW and chair following.  Demonstrates poor standing balance for ADL with reliance on UEs. Pt continues to demonstrate slow processing, decreased problem solving and impaired memory. Given pt's slow progress and multiple medical issues, recommending post acute rehab in SNF for more intensive therapy to prepare pt for impending CABG. Wife is not able to manage pt's multiple medical concerns and physical needs at home. Will continue to follow acutely.  Follow Up Recommendations  SNF, 24 hour supervision    Equipment Recommendations  3 in 1 bedside comode    Recommendations for Other Services      Precautions / Restrictions Precautions Precautions: Fall Restrictions Weight Bearing Restrictions: No       Mobility Bed Mobility Overal bed mobility: Needs Assistance Bed Mobility: Supine to Sit     Supine to sit: Min guard;HOB elevated     General bed mobility comments: extra time, HOB elevated, use of rail  Transfers Overall transfer level: Needs assistance Equipment used: Rolling walker (2  wheeled) Transfers: Sit to/from Stand Sit to Stand: Min assist         General transfer comment: multiple verbal cues for hand placement with each sit to stand, cued to stand momentarily prior to ambulation.    Balance Overall balance assessment: Needs assistance Sitting-balance support: Feet supported Sitting balance-Leahy Scale: Fair Sitting balance - Comments: able to sit EOB without support of UEs     Standing balance-Leahy Scale: Poor                     ADL Overall ADL's : Needs assistance/impaired     Grooming: Set up;Sitting Grooming Details (indicate cue type and reason): Pt not able to release walker for standing grooming.         Upper Body Dressing : Minimal assistance;Sitting Upper Body Dressing Details (indicate cue type and reason): front opening gown     Toilet Transfer: Minimal assistance;Stand-pivot;RW           Functional mobility during ADLs: Minimal assistance;+2 for safety/equipment;Rolling walker (followed with chair)        Vision                     Perception     Praxis      Cognition   Behavior During Therapy: WFL for tasks assessed/performed Overall Cognitive Status: Impaired/Different from baseline Area of Impairment: Following commands;Problem solving;Memory     Memory: Decreased short-term memory  Following Commands: Follows one step commands with increased time (with multimodal cues, repetition)     Problem Solving: Slow processing;Decreased initiation;Requires verbal cues;Difficulty sequencing;Requires tactile cues  General Comments: Pt requiring multiple repetition for hand placement with walker in same session.    Extremity/Trunk Assessment               Exercises General Exercises - Upper Extremity Shoulder Flexion: AROM;Both;Seated;5 reps Elbow Flexion: AROM;Strengthening;Both;5 reps;Seated Elbow Extension: AROM;Both;Seated;5 reps Digit Composite Flexion: AROM;Both;5 reps;Seated Composite  Extension: AROM;Both;5 reps;Seated   Shoulder Instructions       General Comments      Pertinent Vitals/ Pain       Pain Assessment: No/denies pain  Home Living                                          Prior Functioning/Environment              Frequency Min 2X/week     Progress Toward Goals  OT Goals(current goals can now be found in the care plan section)  Progress towards OT goals: Progressing toward goals  Acute Rehab OT Goals Patient Stated Goal: to recover well enough to have his CABG surgery Time For Goal Achievement: 02/07/15 Potential to Achieve Goals: Good  Plan Discharge plan needs to be updated    Co-evaluation    PT/OT/SLP Co-Evaluation/Treatment: Yes Reason for Co-Treatment: For patient/therapist safety   OT goals addressed during session: Strengthening/ROM      End of Session Equipment Utilized During Treatment: Rolling walker;Gait belt   Activity Tolerance Patient limited by fatigue   Patient Left in chair;with call bell/phone within reach;with family/visitor present   Nurse Communication Mobility status        Time: 1610-9604 OT Time Calculation (min): 22 min  Charges: OT General Charges $OT Visit: 1 Procedure OT Treatments $Therapeutic Activity: 8-22 mins  Evern Bio 01/28/2015, 1:46 PM [phone]

## 2015-01-28 NOTE — Progress Notes (Signed)
PT and OT are recommending Home with [facility] and assist of family. AARP medicare also does not typically approve an inpt rehab admission for pt's current diagnosis. Dr. Riley Kill noted at 0804 today that likely expect d/c home with [facility]. I will alert RNCM of our recommendation. Please call for any questions. [phone]

## 2015-01-28 NOTE — Care Management Note (Signed)
Case Management Note  Patient Details  Name: [name] [name] MRN: [id number] Date of Birth: [birth year]  Subjective/Objective:                    Action/Plan:   Expected Discharge Date:                  Expected Discharge Plan:  Home w Home Health Services  In-House Referral:     Discharge planning Services     Post Acute Care Choice:    Choice offered to:     DME Arranged:    DME Agency:     HH Arranged:    HH Agency:     Status of Service:     Medicare Important Message Given:  Yes Date Medicare IM Given:  01/28/15 Medicare IM give by:  debbie Jamonica Schoff rn,bsn Date Additional Medicare IM Given:    Additional Medicare Important Message give by:     If discussed at Long Length of Stay Meetings, dates discussed:  01/29/15  Additional Comments:  Hanley Hays, RN 01/28/2015, 9:20 AM

## 2015-01-28 NOTE — Progress Notes (Signed)
Patient has had 2 small amounts of green bile emesis, unmeasurable, administered zofran after 2nd time.  Family members at bedside, concerned that he is declining again, patient bathed and sat in chair position in the event he gets sick again.  Will continue to monitor closely.

## 2015-01-29 LAB — COMPREHENSIVE METABOLIC PANEL
ALT: 57 U/L (ref 17–63)
AST: 63 U/L — ABNORMAL HIGH (ref 15–41)
Albumin: 1.4 g/dL — ABNORMAL LOW (ref 3.5–5.0)
Alkaline Phosphatase: 63 U/L (ref 38–126)
Anion gap: 8 (ref 5–15)
BUN: 27 mg/dL — ABNORMAL HIGH (ref 6–20)
CHLORIDE: 116 mmol/L — AB (ref 101–111)
CO2: 20 mmol/L — AB (ref 22–32)
Calcium: 6.8 mg/dL — ABNORMAL LOW (ref 8.9–10.3)
Creatinine, Ser: 1.84 mg/dL — ABNORMAL HIGH (ref 0.61–1.24)
GFR calc Af Amer: 41 mL/min — ABNORMAL LOW (ref >60)
GFR calc non Af Amer: 35 mL/min — ABNORMAL LOW (ref >60)
Glucose, Bld: 218 mg/dL — ABNORMAL HIGH (ref 65–99)
Potassium: 4.1 mmol/L (ref 3.5–5.1)
Sodium: 144 mmol/L (ref 135–145)
Total Bilirubin: 1.1 mg/dL (ref 0.3–1.2)
Total Protein: 5.1 g/dL — ABNORMAL LOW (ref 6.5–8.1)

## 2015-01-29 LAB — PREALBUMIN: Prealbumin: 4.3 mg/dL — ABNORMAL LOW (ref 18–38)

## 2015-01-29 LAB — GLUCOSE, CAPILLARY
GLUCOSE-CAPILLARY: 123 mg/dL — AB (ref 65–99)
GLUCOSE-CAPILLARY: 181 mg/dL — AB (ref 65–99)
Glucose-Capillary: 127 mg/dL — ABNORMAL HIGH (ref 65–99)
Glucose-Capillary: 159 mg/dL — ABNORMAL HIGH (ref 65–99)
Glucose-Capillary: 99 mg/dL (ref 65–99)
Glucose-Capillary: 185 mg/dL — ABNORMAL HIGH (ref 65–99)
Glucose-Capillary: 311 mg/dL — ABNORMAL HIGH (ref 65–99)

## 2015-01-29 LAB — CBC
HCT: 27.6 % — ABNORMAL LOW (ref 39.0–52.0)
Hemoglobin: 8.7 g/dL — ABNORMAL LOW (ref 13.0–17.0)
MCH: 29 pg (ref 26.0–34.0)
MCHC: 31.5 g/dL (ref 30.0–36.0)
MCV: 92 fL (ref 78.0–100.0)
Platelets: 201 10*3/uL (ref 150–400)
RBC: 3 MIL/uL — ABNORMAL LOW (ref 4.22–5.81)
RDW: 14.5 % (ref 11.5–15.5)
WBC: 11.2 10*3/uL — ABNORMAL HIGH (ref 4.0–10.5)

## 2015-01-29 LAB — URINE CULTURE
Colony Count: NO GROWTH
Culture: NO GROWTH
Special Requests: NORMAL

## 2015-01-29 LAB — PHOSPHORUS: Phosphorus: 4.1 mg/dL (ref 2.5–4.6)

## 2015-01-29 MED ORDER — INSULIN GLARGINE 100 UNIT/ML ~~LOC~~ SOLN
8.0000 [IU] | Freq: Every day | SUBCUTANEOUS | Status: DC
  Administered 2015-01-29: 8 [IU] via SUBCUTANEOUS
  Filled 2015-01-29 (×2): qty 0.08

## 2015-01-29 NOTE — Progress Notes (Signed)
8 Days Post-Op Procedure(s) (LRB): ESOPHAGOGASTRODUODENOSCOPY (EGD) (N/A) Subjective: Patient sitting up in chair, consumed 25% breakfast Denies chest pain or shortness of breath Having loose stools nonbloody No abdominal pain but abdomen distended Complaint of swollen ankles  Objective: Vital signs in last 24 hours: Temp:  [97.3 F (36.3 C)-99 F (37.2 C)] 98.2 F (36.8 C) (05/24 0750) Pulse Rate:  [80] 80 (05/23 1115) Cardiac Rhythm:  [-] Normal sinus rhythm (05/24 0800) Resp:  [21-26] 21 (05/24 0750) BP: (98-110)/(47-61) 110/47 mmHg (05/24 0750) SpO2:  [95 %-100 %] 100 % (05/24 0750)  Hemodynamic parameters for last 24 hours:  sinus  Intake/Output from previous day: 05/23 0701 - 05/24 0700 In: 1010 [P.O.:360; IV Piggyback:650] Out: 627 [Urine:625; Stool:2] Intake/Output this shift:    Abdomen distended but soft  Lab Results:  Recent Labs  01/28/15 1825 01/29/15 0052  WBC 12.7* 11.2*  HGB 9.3* 8.7*  HCT 29.5* 27.6*  PLT 216 201   BMET:  Recent Labs  01/28/15 0320 01/29/15 0052  NA 145 144  K 3.8 4.1  CL 116* 116*  CO2 23 20*  GLUCOSE 127* 218*  BUN 27* 27*  CREATININE 1.79* 1.84*  CALCIUM 6.7* 6.8*    PT/INR: No results for input(s): LABPROT, INR in the last 72 hours. ABG    Component Value Date/Time   PHART 7.440 01/22/2015 0325   HCO3 17.6* 01/22/2015 0325   TCO2 18.4 01/22/2015 0325   ACIDBASEDEF 5.8* 01/22/2015 0325   O2SAT 58.4 01/23/2015 1105   CBG (last 3)   Recent Labs  01/28/15 1113 01/28/15 1645 01/28/15 2134  GLUCAP 160* 234* 159*    Assessment/Plan: S/P Procedure(s) (LRB): ESOPHAGOGASTRODUODENOSCOPY (EGD) (N/A) Pre albumin is only 4.3 -normal greater than 18 The patient has severe protein depletion malnutrition Not ready for major surgery-CABG We'll follow Recommend keeping patient in hospital because of tenuous cardiac status  LOS: 16 days    [name] [name] 01/29/2015

## 2015-01-29 NOTE — Progress Notes (Signed)
[place] TEAM 1 - Stepdown/ICU TEAM Progress Note  Caster Fayette SJG:[id number] DOB: 07-25-1941 DOA: 01/13/2015 PCP: Monico Blitz, MD  Admit HPI / Brief Narrative: 74 yo WM PMHx HTN and DM Type 2 Presented to [facility] ED after a syncopal episode. He had been having persistent epigastric abdominal pain with associated nausea. The pain progressively got worse, and up to an 8/10 when he passed out. EMS noted he was bradycardic in the 30s with SBP documented in the mid 60s.He had previously been told he has a gallbladder full of stones but had been asymptomatic w/ no h/o pancreatitis.   In ED at [place] showed CBD stone and pancreatitis. Lip was >300 (no actual level). AST/ALT 247/212 - tbili 1.9 - alk phos normal. Lactic acid level 2.2 . Creat 1.7. The pt was transferred to [facility].    HPI/Subjective: 5/24 A/O 4, states negative abdominal pain, negative N/V post-prandial,, negative CP, negative SOB, states has been performing leg lifts, and ambulating around the ward.  Assessment/Plan: Acute Gallstone pancreatitis  -Plan was initially for ERCP, however secondary to patient's possible NSTEMI will proceed today with MRCP instead. -KUB shows possible small ileus : Resolving, patient able to take  PO nutrition without significant abdominal pain, N/V. -Continue Primaxin which was started on 5/16; complete 7 to10-day course  Leukocytosis -Still elevated but trending down, continue to monitor closely      Syncope and collapse -Most likely multifactorial to include vagal, bradycardia, NSTEMI,and systolic CHF  -Resolved  -Continue management per cardiology  Bradycardia  -See syncope and collapse  NSTEMI/Severe 3 vessel CAD/Ischemic cardiomyopathy -Per cardiology attempting to stabilize patient for CABG;   -Strict in and out + 2.62 L -Daily weight -Patient not accepted to CIR, cardiothoracic surgery absolutely does not want patient to the hospital prior to CABG,  transferred to [facility] [place]. in the a.m.  Acute systolic congestive heart failure -Continue Coreg 3.125 mg BID -Transfuse for Hg < 8  HTN/Hypotension  -See CHF    Acute kidney injury?  vs   chronic kidney failure -Unable to determine patient's baseline kidney function through review of EMR.  -Cr waxing and waning but more or less stable continue to watch closely.  DM Type 2 uncontrolled -5/8 hemoglobin A1c = 7.5  -Continue Lantus 8 units QHS -Continue resistant SSI      Code Status: FULL Family Communication:  No family present at time of exam Disposition Plan: - Transfer to [place] in a.m.    Consultants: Dr.Peter Prescott Gum (cardiothoracic surgery) Dr.John Delice Lesch (GI) Dr.David Loren Racer (Cardiology)   Procedure/Significant Events: TTE - 5/9 - EF 30-40% - akinesis of basal-midinferior myocardium - severe hypokinesis lateral myocardium - grade 1 DD 5/10 KUB;Mild dilatation of small bowel likely representing a small-bowel ileus. 5/12 cardiac catheterization; Severe native vessel coronary disease with total occlusion of the RCA/proximal circumflex,-severe proximal LAD disease. -Dominant diagonal contains segmental 50% narrowing. -left ventricle significant systolic impairment; -MLYY=50% with inferior wall severe hypokinesis. 5/19 placement of retrievable infrarenal IVC filter     Culture 5/18 MRSA by PCR negative 5/14 C. difficile by PCR negative 5/16 blood right antecubital/right arm NGTD 5/17 trach aspirate negative    Antibiotics: Zosyn 5/12>> stopped 5/13 Vancomycin 5/16>> stopped 5/19 Primaxin 5/16>>  DVT prophylaxis: IV heparin    Devices    LINES / TUBES:      Continuous Infusions:    Objective: VITAL SIGNS: Temp: 98.4 F (36.9 C) (05/24 1900) Temp Source: Oral (05/24 1900) BP: 114/51  mmHg (05/24 1900) SPO2; FIO2:   Intake/Output Summary (Last 24 hours) at 01/29/15 2035 Last data filed at 01/29/15 1805  Gross per 24 hour  Intake     400 ml  Output    627 ml  Net   -227 ml     Exam: General: A/O 4, NAD, No acute respiratory distress Eyes: Negative eye pain, double vision, scotomas, floaters, negative retinal hemorrhage ENT: Negative Runny nose, negative ear pain, negative tinnitus,  Neck:  Negative scars, masses, torticollis, lymphadenopathy, JVD Skin ; negative rash, lesions, wounds, right IJ site clean and covered negative sign of infection  Lungs: Clear to auscultation bilaterally without wheezes or crackles Cardiovascular: Regular rate and rhythm without murmur gallop or rub normal S1 and S2 Abdomen: Negative tenderness, nondistended, soft, bowel sounds positive, no rebound, no ascites, no appreciable mass Extremities: No significant cyanosis, clubbing, or edema bilateral lower extremities Psychiatric:  Negative depression, negative anxiety, negative fatigue, negative mania  Neurologic:  Cranial nerves II through XII intact, negative dysarthria, negative expressive aphasia, negative receptive aphasia.     Data Reviewed: Basic Metabolic Panel:  Recent Labs Lab 01/25/15 0440 01/26/15 0410 01/27/15 0400 01/28/15 0320 01/29/15 0052 01/29/15 0103  NA 145 148* 147* 145 144  --   K 3.9 3.6 3.6 3.8 4.1  --   CL 117* 116* 117* 116* 116*  --   CO2 22 24 23 23  20*  --   GLUCOSE 181* 239* 146* 127* 218*  --   BUN 38* 31* 26* 27* 27*  --   CREATININE 2.06* 1.85* 1.80* 1.79* 1.84*  --   CALCIUM 7.1* 6.9* 6.7* 6.7* 6.8*  --   MG  --   --  1.8 2.1  --   --   PHOS  --   --   --   --   --  4.1   Liver Function Tests:  Recent Labs Lab 01/25/15 0440 01/26/15 0410 01/27/15 0400 01/28/15 0320 01/29/15 0052  AST 149* 149* 139* 95* 63*  ALT 96* 104* 94* 73* 57  ALKPHOS 72 85 74 61 63  BILITOT 1.7* 1.5* 1.4* 1.3* 1.1  PROT 5.1* 4.9* 5.1* 4.6* 5.1*  ALBUMIN 1.4* 1.3* 1.3* 1.3* 1.4*    Recent Labs Lab 01/24/15 1400 01/25/15 0440 01/27/15 0400  LIPASE 13* 16* 15*  AMYLASE 45  --   --    No results for  input(s): AMMONIA in the last 168 hours. CBC:  Recent Labs Lab 01/26/15 0410 01/27/15 0400 01/28/15 0320 01/28/15 1825 01/29/15 0052  WBC 16.2* 13.9* 10.9* 12.7* 11.2*  NEUTROABS  --  11.8* 9.0*  --   --   HGB 9.4* 8.9* 8.5* 9.3* 8.7*  HCT 27.9* 27.3* 26.5* 29.5* 27.6*  MCV 89.7 90.7 91.1 91.6 92.0  PLT 178 175 181 216 201   Cardiac Enzymes: No results for input(s): CKTOTAL, CKMB, CKMBINDEX, TROPONINI in the last 168 hours. BNP (last 3 results) No results for input(s): BNP in the last 8760 hours.  ProBNP (last 3 results) No results for input(s): PROBNP in the last 8760 hours.  CBG:  Recent Labs Lab 01/28/15 1645 01/28/15 2134 01/29/15 0748 01/29/15 1151 01/29/15 1645  GLUCAP 234* 159* 311* 181* 185*    Recent Results (from the past 240 hour(s))  Culture, blood (routine x 2)     Status: None   Collection Time: 01/21/15  9:30 AM  Result Value Ref Range Status   Specimen Description BLOOD RIGHT ANTECUBITAL  Final   Special  Requests BOTTLES DRAWN AEROBIC AND ANAEROBIC 10CC  Final   Culture   Final    NO GROWTH 5 DAYS Performed at [facility]    Report Status 01/27/2015 FINAL  Final  Culture, blood (routine x 2)     Status: None   Collection Time: 01/21/15  9:40 AM  Result Value Ref Range Status   Specimen Description BLOOD RIGHT ARM  Final   Special Requests BOTTLES DRAWN AEROBIC ONLY 5CC  Final   Culture   Final    NO GROWTH 5 DAYS Performed at [facility]    Report Status 01/27/2015 FINAL  Final  Culture, respiratory (NON-Expectorated)     Status: None   Collection Time: 01/22/15 12:20 PM  Result Value Ref Range Status   Specimen Description TRACHEAL ASPIRATE  Final   Special Requests NONE  Final   Gram Stain   Final    FEW WBC PRESENT,BOTH PMN AND MONONUCLEAR NO SQUAMOUS EPITHELIAL CELLS SEEN NO ORGANISMS SEEN Performed at [facility]    Culture   Final    Non-Pathogenic Oropharyngeal-type Flora Isolated. Performed at  [facility]    Report Status 01/24/2015 FINAL  Final     Studies:  Recent x-ray studies have been reviewed in detail by the Attending Physician  Scheduled Meds:  Scheduled Meds: . carvedilol  3.125 mg Oral BID WC  . enoxaparin (LOVENOX) injection  40 mg Subcutaneous Q24H  . feeding supplement (ENSURE ENLIVE)  237 mL Oral TID BM  . imipenem-cilastatin  250 mg Intravenous 4 times per day  . insulin aspart  0-20 Units Subcutaneous TID WC  . insulin glargine  5 Units Subcutaneous QHS  . latanoprost  1 drop Both Eyes QHS  . metoCLOPramide (REGLAN) injection  5 mg Intravenous 3 times per day  . pantoprazole (PROTONIX) IV  40 mg Intravenous Q12H  . ranolazine  500 mg Oral BID    Time spent on care of this patient: 40 mins   WOODS, Geraldo Docker , MD  [facility] Office  [phone] Pager - [phone]  On-Call/Text Page:      [email or web address]      password [email or web address]  If 7PM-7AM, please contact night-coverage [email or web address] Password [email or web address] 01/29/2015, 8:35 PM   LOS: 16 days   Care during the described time interval was provided by me .  I have reviewed this patient's available data, including medical history, events of note, physical examination, and all test results as part of my evaluation. I have personally reviewed and interpreted all radiology studies.   Dia Crawford, MD [phone] Pager

## 2015-01-29 NOTE — Progress Notes (Signed)
Speech Language Pathology Treatment: Dysphagia  Patient Details Name: [name] [name] MRN: [id number] DOB: January 04, 1941 Today's Date: 01/29/2015 Time: 0233-4356 SLP Time Calculation (min) (ACUTE ONLY): 23 min  Assessment / Plan / Recommendation Clinical Impression  Dysphagia treatment with wife and daughter at bedside. Pt completed breakfast just prior to SLP's arrival and stated that he was having "some" coughing with meals. Reviewed results of MBS with pt/family, clinical reasoning and recommendations. He required moderate verbal/visual cues to implement strategies consistently. No s/s aspiration exhibited. SLP placed swallow sign with strategies in pt's view due to mild confusion and forgetfulness. Diet recommended is Dys 1 (puree) and soft diet was ordered. No complaints of decreased mastication or increased pharyngeal residue. SLP plans to observe pt with this texture to ensure appropriateness.    HPI Other Pertinent Information: 74 y/o M admitted 5/8 after a syncopal episode and found to have acute pancreatitis. Also positive troponin/NSTEMI. EGD shows grade 4 esophagitis with deep ulcers, eschar, friability, blood clots in stomach. Intubated from 5/16 to 5/18. Pt reprotedly having difficulty swallowing dinner on 5/18.    Pertinent Vitals Pain Assessment: No/denies pain  SLP Plan  Continue with current plan of care    Recommendations Diet recommendations: Thin liquid ("soft") Liquids provided via: Cup;Straw Medication Administration: Crushed with puree Supervision: Patient able to self feed;Full supervision/cueing for compensatory strategies Compensations: Small sips/bites;Slow rate;Multiple dry swallows after each bite/sip;Follow solids with liquid Postural Changes and/or Swallow Maneuvers: Seated upright 90 degrees;Upright 30-60 min after meal              Oral Care Recommendations: Oral care BID Follow up Recommendations:  (TBD) Plan: Continue with current plan of care    GO      Royce Macadamia 01/29/2015, 12:26 PM    Breck Coons Lonell Face.Ed [employer] [phone]

## 2015-01-29 NOTE — Progress Notes (Signed)
Subjective: No chest pains, no SOB, able to lie flat, Having some episodes of Diarrhea. No events overnight.  Objective: Vital signs in last 24 hours: Filed Vitals:   01/28/15 2023 01/28/15 2345 01/29/15 0511 01/29/15 0750  BP: 109/61 110/55 98/48 110/47  Pulse:      Temp: 97.8 F (36.6 C) 97.4 F (36.3 C) 98.1 F (36.7 C) 98.2 F (36.8 C)  TempSrc: Oral Oral Oral Oral  Resp: Height:      Weight:      SpO2: 95%  97% 100%   Weight change:   Intake/Output Summary (Last 24 hours) at 01/29/15 1046 Last data filed at 01/29/15 0700  Gross per 24 hour  Intake    770 ml  Output    627 ml  Net    143 ml   General appearance: Sitting in recliner, family at bedside,  Head: Normocephalic, without obvious abnormality, atraumatic Lungs: Clear- bilaterly posteriorly Heart: S1 S2, no murmurs, rubs or gallops apprecaited.  Abdomen: Appears slightly distended, compared to prior, not tender Extremities: Wearing TED hose, +1 pitting pedal edema, DP pulse difficult to appreciate bilat. Urine bag- Clear urine.   Tele- Normal Sinus rhythm with occassional PVCs.   Lab Results: Basic Metabolic Panel:  Recent Labs Lab 01/27/15 0400 01/28/15 0320 01/29/15 0052 01/29/15 0103  NA 147* 145 144  --   K 3.6 3.8 4.1  --   CL 117* 116* 116*  --   CO2 23 23 20*  --   GLUCOSE 146* 127* 218*  --   BUN 26* 27* 27*  --   CREATININE 1.80* 1.79* 1.84*  --   CALCIUM 6.7* 6.7* 6.8*  --   MG 1.8 2.1  --   --   PHOS  --   --   --  4.1   Liver Function Tests:  Recent Labs Lab 01/28/15 0320 01/29/15 0052  AST 95* 63*  ALT 73* 57  ALKPHOS 61 63  BILITOT 1.3* 1.1  PROT 4.6* 5.1*  ALBUMIN 1.3* 1.4*    Recent Labs Lab 01/24/15 1400 01/25/15 0440 01/27/15 0400  LIPASE 13* 16* 15*  AMYLASE 45  --   --    CBC:  Recent Labs Lab 01/27/15 0400 01/28/15 0320 01/28/15 1825 01/29/15 0052  WBC 13.9* 10.9* 12.7* 11.2*  NEUTROABS 11.8* 9.0*  --   --   HGB 8.9* 8.5* 9.3*  8.7*  HCT 27.3* 26.5* 29.5* 27.6*  MCV 90.7 91.1 91.6 92.0  PLT 175 181 216 201   CBG:  Recent Labs Lab 01/27/15 2359 01/28/15 0340 01/28/15 0734 01/28/15 1113 01/28/15 1645 01/28/15 2134  GLUCAP 99 127* 123* 160* 234* 159*   Coagulation:  Recent Labs Lab 01/23/15 0447  LABPROT 17.6*  INR 1.43   Micro Results: Recent Results (from the past 240 hour(s))  Clostridium Difficile by PCR     Status: None   Collection Time: 01/19/15  3:12 PM  Result Value Ref Range Status   C difficile by pcr NEGATIVE NEGATIVE Final  Culture, blood (routine x 2)     Status: None   Collection Time: 01/21/15  9:30 AM  Result Value Ref Range Status   Specimen Description BLOOD RIGHT ANTECUBITAL  Final   Special Requests BOTTLES DRAWN AEROBIC AND ANAEROBIC 10CC  Final   Culture   Final    NO GROWTH 5 DAYS Performed at [facility]    Report Status 01/27/2015 FINAL  Final  Culture, blood (  routine x 2)     Status: None   Collection Time: 01/21/15  9:40 AM  Result Value Ref Range Status   Specimen Description BLOOD RIGHT ARM  Final   Special Requests BOTTLES DRAWN AEROBIC ONLY 5CC  Final   Culture   Final    NO GROWTH 5 DAYS Performed at [facility]    Report Status 01/27/2015 FINAL  Final  Culture, respiratory (NON-Expectorated)     Status: None   Collection Time: 01/22/15 12:20 PM  Result Value Ref Range Status   Specimen Description TRACHEAL ASPIRATE  Final   Special Requests NONE  Final   Gram Stain   Final    FEW WBC PRESENT,BOTH PMN AND MONONUCLEAR NO SQUAMOUS EPITHELIAL CELLS SEEN NO ORGANISMS SEEN Performed at [facility]    Culture   Final    Non-Pathogenic Oropharyngeal-type Flora Isolated. Performed at [facility]    Report Status 01/24/2015 FINAL  Final   Studies/Results: No results found. Medications: I have reviewed the patient's current medications. Scheduled Meds: . carvedilol  3.125 mg Oral BID WC  . enoxaparin (LOVENOX)  injection  40 mg Subcutaneous Q24H  . feeding supplement (ENSURE ENLIVE)  237 mL Oral TID BM  . imipenem-cilastatin  250 mg Intravenous 4 times per day  . insulin aspart  0-20 Units Subcutaneous TID WC  . insulin glargine  5 Units Subcutaneous QHS  . latanoprost  1 drop Both Eyes QHS  . metoCLOPramide (REGLAN) injection  5 mg Intravenous 3 times per day  . pantoprazole (PROTONIX) IV  40 mg Intravenous Q12H  . ranolazine  500 mg Oral BID   Continuous Infusions:   PRN Meds:.acetaminophen, bisacodyl, fentaNYL (SUBLIMAZE) injection, ondansetron (ZOFRAN) IV Assessment/Plan: Principal Problem:   Acute gallstone pancreatitis Active Problems:   Abdominal pain, epigastric   Nausea with vomiting   Elevated LFTs   Gallstones   Syncope   Hypotension   Bradycardia   AKI (acute kidney injury)   Diabetes   Pancreatitis   NSTEMI (non-ST elevated myocardial infarction)   Cardiomyopathy   Choledocholithiasis   Hypocalcemia   Ileus   Acute biliary pancreatitis   Leukocytosis   Syncope and collapse   Acute systolic congestive heart failure   Essential hypertension   Other specified hypotension   Acute kidney injury   Diabetes type 2, uncontrolled   CAD, multiple vessel: CTO of RCA, Cx with severe prox LAD disease   Calculus of gallbladder with acute cholecystitis without obstruction   Cholecystitis, acute   Metabolic acidosis   Acute systolic CHF (congestive heart failure)   Acute renal failure syndrome   Acute blood loss anemia   Hemorrhagic shock   Acute respiratory failure with hypoxemia   Septic shock   Ulcerative esophagitis   Hematemesis with nausea   Melena   Demand ischemia of myocardium   Erosive esophagitis   HCAP (healthcare-associated pneumonia)   Shock   DVT (deep venous thrombosis)   Cardiomyopathy, ischemic   Ischemic cardiomyopathy  1. Hemorrhagic Shock/Acute Blood Loss Anemia- resolved, S/p 5 units of PRBC. Hgb appears stable- 8.5 8.7, >> 9.3- 01/28/2015.  No  more bloody bowel movements, but having some diarrhea. Restarted Lovenox-  daily.  - Per primary  2. CAD- Multi-vessel dx on Cath- 01/17/2015, planned for CABG when acute issues resolve.Severe native vessel coronary disease with total occlusion of the RCA, total occlusion of the proximal circumflex, and severe proximal LAD disease.  - CVTS Recs appreciated, awaiting resolution of acute  medical problems, and improvement in physical status - Not on asa 81, can resume soon, on Lovenox now for DVT ppx - Pt stable for transfer, Transfer to 2W, while awaiting CABG  - Cont carvedilol and ranexa, uptitrate Coreg as tolerated   3. Acute systolic HF: EF 30 - 40%.  - Volume status looks good. Now on low-dose carvedilol. -  No ACE/ARB due to renal failure.  - Consideration to Start low-dose hydral-nitrates, will hold for now. Ranolaxine with Imdur can be used together, and is considered safe.  4. Acute LE DVTs- Off systemic heparin due to acute severe GI bleed, so far requiring 5units of blood. Back on low-dose enoxaparin. Consider starting Anticaog when stable. - S/p IVC filter placement.   5. Acute Pancreatitis with Acute calculous cholecystitis- Improving.  - On primaxin - Per TRH  6. AKI on CKD- Improving, Cr- 1.84 today. Peak 2.28- 01/21/2015 - Will hold Lasix for now, pt able to lie flat, lower extremity swelling likely from DVT, can add as needed  7. Erosive Esophagitis- Appears stable.  - management per primary  8. Diabetes- On SSI  - Glucerna Shakes.   9. Diarrhea - per primary.  Mariea Clonts, [place],MD PGY-2, IMTS- resident. 10:46 AM 01/28/2015   Attending Note:   The patient was seen and examined.  Agree with assessment and plan as noted above.  Changes made to the above note as needed.  Very complicated patient.  Slow progress.   Needs to get stronger before we attempt CABG.      Vesta Mixer, Montez Hageman., MD, [facility] 01/29/2015, 12:55 PM [address],  [address] Office [phone] Pager [phone]

## 2015-01-29 NOTE — Progress Notes (Signed)
CR order was discontinued at some point. Please reorder if ambulation in hall is warranted before CABG. Ethelda Chick CES, ACSM 2:24 PM 01/29/2015

## 2015-01-30 LAB — GLUCOSE, CAPILLARY
GLUCOSE-CAPILLARY: 260 mg/dL — AB (ref 65–99)
GLUCOSE-CAPILLARY: 139 mg/dL — AB (ref 65–99)
Glucose-Capillary: 225 mg/dL — ABNORMAL HIGH (ref 65–99)
Glucose-Capillary: 229 mg/dL — ABNORMAL HIGH (ref 65–99)
Glucose-Capillary: 140 mg/dL — ABNORMAL HIGH (ref 65–99)

## 2015-01-30 MED ORDER — FUROSEMIDE 40 MG PO TABS
40.0000 mg | ORAL_TABLET | Freq: Every day | ORAL | Status: DC
  Administered 2015-01-30 – 2015-02-07 (×8): 40 mg via ORAL
  Filled 2015-01-30 (×9): qty 1

## 2015-01-30 MED ORDER — SODIUM CHLORIDE 0.9 % IV SOLN
INTRAVENOUS | Status: DC
  Administered 2015-01-30: 10:00:00 via INTRAVENOUS

## 2015-01-30 MED ORDER — INSULIN GLARGINE 100 UNIT/ML ~~LOC~~ SOLN
12.0000 [IU] | Freq: Every day | SUBCUTANEOUS | Status: DC
  Administered 2015-01-30 – 2015-01-31 (×2): 12 [IU] via SUBCUTANEOUS
  Filled 2015-01-30 (×4): qty 0.12

## 2015-01-30 NOTE — Progress Notes (Signed)
CARDIAC REHAB PHASE I   PRE:  Rate/Rhythm: 81 SR c/ PACs  BP:  Sitting: 145/46        SaO2: 97 RA  MODE:  Ambulation: 80 ft   POST:  Rate/Rhythm: 88 SR  BP:  Sitting: 140/66         SaO2: 96 RA  Pt ambulated 80 ft on RA, assist x2, rollator, gait belt, mildly unsteady gait, slow pace, tolerated fair.  Sitting rest x 1.  Pt needs encouragement to increase confidence with ambulation. Denies CP, dizziness, DOE, does have edema to bilateral upper and lower extremeties. VSS. Pt to recliner after walk, call bell within reach. Pt states he prefers using the rollator to the rolling walker and is looking forward to walking again.   [id number]  Joylene Grapes, RN, BSN 01/30/2015 1:54 PM

## 2015-01-30 NOTE — Care Management Note (Signed)
Case Management Note  Patient Details  Name: Daryon Derouin MRN: [id number] Date of Birth: April 13, 1941  Subjective/Objective:                    Action/Plan:   Expected Discharge Date:                  Expected Discharge Plan:  Skilled Nursing Facility  In-House Referral:  Clinical Social Work  Discharge planning Services     Post Acute Care Choice:    Choice offered to:     DME Arranged:    DME Agency:     HH Arranged:    HH Agency:     Status of Service:     Medicare Important Message Given:  Yes Date Medicare IM Given:  01/28/15 Medicare IM give by:  debbie Annalise Mcdiarmid rn,bsn Date Additional Medicare IM Given:    Additional Medicare Important Message give by:     If discussed at Long Length of Stay Meetings, dates discussed:    Additional Comments: pt's awaits stability for cabg. He will cont to work w pt-ot-st. After surg may need snf for rehab prior to home. Cir not approved at present for inpt rehab. Would need snf for rehab if needed prior to surg and will poss need snf after surg. Will follw pt progress.  Hanley Hays, RN 01/30/2015, 10:47 AM

## 2015-01-30 NOTE — Progress Notes (Signed)
[place] TEAM 1 - Stepdown/ICU TEAM Progress Note  Carle Fenech FVC:[id number] DOB: 02-18-1941 DOA: 01/13/2015 PCP: Monico Blitz, MD  Admit HPI / Brief Narrative: 74 yo male h/o HTN and DM who presented to [facility] ED after a syncopal episode. He had been having persistent epigastric pain with associated nausea. The pain progressively got worse, and up to an 8/10 when he passed out. EMS noted he was bradycardic in the 30s with SBP documented in the mid 60s.He had previously been told he has a gallbladder full of stones but had been asymptomatic w/ no h/o pancreatitis.   In ED at [place] showed cbd stone and pancreatitis. Lip was >300 (no actual level). AST/ALT 247/212 - tbili 1.9 - alk phos normal. Lactic acid level 2.2 . Creat 1.7. The pt was transferred to [facility].   Significant Events: 5/08 Transfer from [place] after syncopal episode, gallstones, pancreatitis, elevated troponin I, abnormal echocardiogram 5/09 TTE: EF 30-40%, akinesis of basal-midinferior myocardium - severe hypokinesis lateral myocardium - grade 1 DD 5/12 LHC: severe 3 vessel disease  5/13 CT ABD/Pelvis: infiltration edema around pancreas, c/w acute pancreatitis, no abscess, cholelithiasis with stones in CBD, bilateral pleural effusions with compressive atx. 5/15 transfused one unit PRBCs 5/16 Transferred to ICU with acute respiratory distress. Intubated. PCCM consultation. CXR revealed BLL consolidation 5/16 transfused one unit PRBCs 5/16 EGD: grade 4 esophagitis with deep ulcers, eschar, friability, blood clllots in stomach, no gastric ulcers or mucosal abnormalities, mod edema of 2nd portion of duodenum c/w pancreatitis. PPI infusion initiated. Heparin disconitnued. OGT left out 5/17 transfused one unit PRBCs for Hgb 7.4 in setting of ACS 5/18 CXR with improved basilar aeration. Extubated 5/19 LE venous duplex scans + B LE DVT 5/19 IVC filter placed   HPI/Subjective: The patient is resting  comfortably in a bedside chair having just worked with cardiac rehabilitation.  He denies chest pain or shortness of breath.  He does report intermittent bloating sensation of his abdomen with some epigastric discomfort.  He has not had a bowel movement today but did so yesterday.  He reports that his appetite is dramatically improved.  Assessment/Plan:  Hemorrhagic Shock / Acute Blood Loss Anemia Due to reflux related erosive esophagitis plus anticoagulation - resolved - S/p 5 units of PRBC - hemoglobin appears to have stabilized at this time - transfuse as needed to keep hemoglobin 8.0 or greater - if CBC is stable again in a.m. will plan to transition to full dose anticoagulation for DVT treatment  Erosive Esophagitis GI signed off 5/18 - initial PPI drip titrated to PPI twice a day indefinitely - no evidence of ongoing bleeding at this time  Acute Gallstone pancreatitis  MRCP suggested possible 4 mm stone within the common bile duct (though images not ideal ) - GI followed - lipase normalized - LFTs normal - total bili has normalized - no GI interventions planned  NSTEMI - Severe 3 vessel CAD Cardiac cath revealed total RCA, total prox cfx, severe prox LAD, 50% dz of dominant diagonal - Cards following - will need eventual CABG - TCTS following - Cardiology and TCTS suggest the patient not leave the hospital though patient will need to fully recover from his severe acute pancreatitis prior to surgery - goal of CIR prior to surgery not possible as insurance will not cover - transfer to telemetry bed  Acute B LE DVTs Now has IVC filter - prophylactic dose anticoagulation only currently - if hemoglobin remains stable over next 24 hours we will  plan to increase Lovenox to full treatment dose and follow closely   SB ileus  Pt having intermittent difficulty with nausea/vomiting and abdominal distention - ambulation is key to assist with GI motility - no gross evidence of obstruction today  Acute  kidney injury  renal ultrasound unrevealing - avoid all potential nephrotoxins - creatinine appears to have stabilized suggesting his new baseline creatinine will be approximately 1.6 - continue to follow  Hypocalcemia Corrected calcium is actually approximately 9 therefore replacement not indicated presently  Persistent Hypokalemia Corrected with supplementation  Hypophosphatemia Has normalized  Syncope  Initially felt to be Vagal due to pain - no recurrence since admission   Bradycardia  Initially felt to be Vagal - has not recurred while inpatient   SVT  Responded to adenosine given at bedside with restoration of sinus rhythm - normal sinus rhythm at present   Newly diagnosed systolic congestive heart failure No ACE inhibitor due to acute kidney injury - on beta blocker - is volume overloaded at present though I agree this is likely primarily being driven by low oncotic pressure as opposed to a true CHF exacerbation - Cardiology following - diurese and follow  Leukocytosis  Felt to be due to inflammation of pancreatitis - no other sx presently to suggest infection presently - empiric antibiotics have not impacted his white count of late but do appear to have been associated with a significant reduction initially -   Thrombocytopenia  HIT panel negative - suspect due to acute inflam illness - no evidence of acute blood loss - platelet count has normalized  Hypotension > hypertension Hypotension resolved with volume expansion - blood pressure controlled  DM CBG poorly controlled with improving intake - adjust treatment and follow  Nutrition Is tolerating his current diet without any difficulty - encouraged ongoing oral intake  Code Status: FULL Family Communication: No family present at time of exam today Disposition Plan: Insurance will not cover CIR - transfer to cardiac telemetry bed while awaiting CABG  Consultants: GI - [facility]   TCTS  Antibiotics: Zosyn 5/12 > 5/13 Vancomycin 5/16 > 5/19 Primaxin 5/16 > 5/25  DVT prophylaxis: Lovenox  Objective: Blood pressure 97/49, pulse 80, temperature 98.4 F (36.9 C), temperature source Oral, resp. rate 18, height 6' (1.829 m), weight 85.2 kg (187 lb 13.3 oz), SpO2 97 %.  Intake/Output Summary (Last 24 hours) at 01/30/15 1410 Last data filed at 01/30/15 1300  Gross per 24 hour  Intake  637.5 ml  Output    300 ml  Net  337.5 ml   Exam: General: No acute respiratory distress - alert and oriented Lungs: Clear to auscultation bilaterally without wheezes or crackles Cardiovascular: Regular rate and rhythm without murmur gallop or rub normal S1 and S2 Abdomen: Mildly tender to deep palpation in epigastrium, modestly protuberant, soft, bowel sounds positive, no rebound, no ascites, no appreciable mass Extremities: No significant cyanosis, or clubbing; 2+ edema bilateral lower extremities  Data Reviewed: Basic Metabolic Panel:  Recent Labs Lab 01/25/15 0440 01/26/15 0410 01/27/15 0400 01/28/15 0320 01/29/15 0052 01/29/15 0103  NA 145 148* 147* 145 144  --   K 3.9 3.6 3.6 3.8 4.1  --   CL 117* 116* 117* 116* 116*  --   CO2 22 24 23 23  20*  --   GLUCOSE 181* 239* 146* 127* 218*  --   BUN 38* 31* 26* 27* 27*  --   CREATININE 2.06* 1.85* 1.80* 1.79* 1.84*  --  CALCIUM 7.1* 6.9* 6.7* 6.7* 6.8*  --   MG  --   --  1.8 2.1  --   --   PHOS  --   --   --   --   --  4.1     Liver Function Tests:  Recent Labs Lab 01/25/15 0440 01/26/15 0410 01/27/15 0400 01/28/15 0320 01/29/15 0052  AST 149* 149* 139* 95* 63*  ALT 96* 104* 94* 73* 57  ALKPHOS 72 85 74 61 63  BILITOT 1.7* 1.5* 1.4* 1.3* 1.1  PROT 5.1* 4.9* 5.1* 4.6* 5.1*  ALBUMIN 1.4* 1.3* 1.3* 1.3* 1.4*    Recent Labs Lab 01/24/15 1400 01/25/15 0440 01/27/15 0400  LIPASE 13* 16* 15*  AMYLASE 45  --   --    CBC:  Recent Labs Lab 01/26/15 0410 01/27/15 0400 01/28/15 0320 01/28/15 1825  01/29/15 0052  WBC 16.2* 13.9* 10.9* 12.7* 11.2*  NEUTROABS  --  11.8* 9.0*  --   --   HGB 9.4* 8.9* 8.5* 9.3* 8.7*  HCT 27.9* 27.3* 26.5* 29.5* 27.6*  MCV 89.7 90.7 91.1 91.6 92.0  PLT 178 175 181 216 201   CBG:  Recent Labs Lab 01/29/15 1151 01/29/15 1645 01/29/15 2139 01/30/15 0823 01/30/15 1224  GLUCAP 181* 185* 225* 260* 229*    Recent Results (from the past 240 hour(s))  Culture, blood (routine x 2)     Status: None   Collection Time: 01/21/15  9:30 AM  Result Value Ref Range Status   Specimen Description BLOOD RIGHT ANTECUBITAL  Final   Special Requests BOTTLES DRAWN AEROBIC AND ANAEROBIC 10CC  Final   Culture   Final    NO GROWTH 5 DAYS Performed at [facility]    Report Status 01/27/2015 FINAL  Final  Culture, blood (routine x 2)     Status: None   Collection Time: 01/21/15  9:40 AM  Result Value Ref Range Status   Specimen Description BLOOD RIGHT ARM  Final   Special Requests BOTTLES DRAWN AEROBIC ONLY 5CC  Final   Culture   Final    NO GROWTH 5 DAYS Performed at [facility]    Report Status 01/27/2015 FINAL  Final  Culture, respiratory (NON-Expectorated)     Status: None   Collection Time: 01/22/15 12:20 PM  Result Value Ref Range Status   Specimen Description TRACHEAL ASPIRATE  Final   Special Requests NONE  Final   Gram Stain   Final    FEW WBC PRESENT,BOTH PMN AND MONONUCLEAR NO SQUAMOUS EPITHELIAL CELLS SEEN NO ORGANISMS SEEN Performed at [facility]    Culture   Final    Non-Pathogenic Oropharyngeal-type Flora Isolated. Performed at [facility]    Report Status 01/24/2015 FINAL  Final  Urine culture     Status: None   Collection Time: 01/28/15  6:26 PM  Result Value Ref Range Status   Specimen Description URINE, CLEAN CATCH  Final   Special Requests Normal  Final   Colony Count NO GROWTH Performed at [facility]   Final   Culture NO GROWTH Performed at [facility]   Final    Report Status 01/29/2015 FINAL  Final     Studies:  Recent x-ray studies have been reviewed in detail by the Attending Physician  Scheduled Meds:  Scheduled Meds: . carvedilol  3.125 mg Oral BID WC  . enoxaparin (LOVENOX) injection  40 mg Subcutaneous Q24H  . feeding supplement (ENSURE ENLIVE)  237 mL  Oral TID BM  . furosemide  40 mg Oral Daily  . imipenem-cilastatin  250 mg Intravenous 4 times per day  . insulin aspart  0-20 Units Subcutaneous TID WC  . insulin glargine  8 Units Subcutaneous QHS  . latanoprost  1 drop Both Eyes QHS  . metoCLOPramide (REGLAN) injection  5 mg Intravenous 3 times per day  . pantoprazole (PROTONIX) IV  40 mg Intravenous Q12H  . ranolazine  500 mg Oral BID    Time spent on care of this patient: 35 mins  Cherene Altes, MD [facility] For Consults/Admissions - Flow Manager - [phone] Office  [phone]  Contact MD directly via text page:      [email or web address]      password [facility]  01/30/2015, 2:10 PM   LOS: 17 days

## 2015-01-30 NOTE — Progress Notes (Signed)
Physical Therapy Treatment Patient Details Name: [name] [name] MRN: [id number] DOB: [birth year] Today's Date: 01/30/2015    History of Present Illness Admitted with epigastric pain significant; also with NSTEMI; Cardiac cath revealed total RCA, total prox cfx, severe prox LAD, 50% dz of dominant diagonal - Cards following - will need eventual CABG - TCTS now following - patient will need to fully recover from his severe acute pancreatitis prior to surgery. Increased WOB and GIB with pt transferred to ICU and intubated 5/16 to 5/18. Resident requesting bed to chair only on 5/19 until cleared by cardiology for ambulation.    PT Comments    Pt ambulated 102' with RW and min-guard A (seated rest break halfway) with VSS, no LOB with use of RW. PT will continue to follow.   Follow Up Recommendations  Supervision/Assistance - 24 hour;SNF     Equipment Recommendations  3in1 (PT)    Recommendations for Other Services OT consult     Precautions / Restrictions Precautions Precautions: Fall Precaution Comments: Fall risk greatly reduced with use of RW Restrictions Weight Bearing Restrictions: No    Mobility  Bed Mobility               General bed mobility comments: pt received in chair  Transfers Overall transfer level: Needs assistance Equipment used: Rolling walker (2 wheeled) Transfers: Sit to/from Stand Sit to Stand: Min guard;Min assist         General transfer comment: despite vc's for haolding arm rests of chair before sitting and controlling descent, pt with uncontrolled descent to chair first time (chair was blocked by therapist). Second time pt followed commands and controlled sit to chair. Pt able to perform sit to stand without physical assist, though needed cues for hand placement each time with this as well  Ambulation/Gait Ambulation/Gait assistance: Min guard Ambulation Distance (Feet): 102 Feet (51 x2) Assistive device: Rolling walker (2 wheeled) Gait  Pattern/deviations: Step-through pattern;Decreased stride length Gait velocity: decreased Gait velocity interpretation: <1.8 ft/sec, indicative of risk for recurrent falls General Gait Details: vc's for rolling walker instead of picking up with each step and vc's for fwd gaze   Stairs            Wheelchair Mobility    Modified Rankin (Stroke Patients Only)       Balance Overall balance assessment: Needs assistance Sitting-balance support: No upper extremity supported Sitting balance-Leahy Scale: Fair     Standing balance support: No upper extremity supported Standing balance-Leahy Scale: Fair Standing balance comment: can maintain static stance without UE support, needs UE support for safety with activity                    Cognition Arousal/Alertness: Awake/alert Behavior During Therapy: WFL for tasks assessed/performed Overall Cognitive Status: Impaired/Different from baseline Area of Impairment: Problem solving;Following commands     Memory: Decreased short-term memory Following Commands: Follows one step commands with increased time;Follows multi-step commands with increased time     Problem Solving: Slow processing;Requires verbal cues General Comments: multiple cues needed and pt with overall dampened responses    Exercises General Exercises - Lower Extremity Ankle Circles/Pumps: AROM;Both;20 reps;Seated Hip Flexion/Marching: AROM;Both;10 reps;Seated    General Comments General comments (skin integrity, edema, etc.): HR 90 bpm with ambulation, O2 sats 98%      Pertinent Vitals/Pain Pain Assessment: Faces Faces Pain Scale: Hurts a little bit Pain Location: abdomen Pain Descriptors / Indicators: Tightness Pain Intervention(s): Monitored during session    Home Living  Prior Function            PT Goals (current goals can now be found in the care plan section) Acute Rehab PT Goals Patient Stated Goal: to  recover well enough to have his CABG surgery PT Goal Formulation: With patient Time For Goal Achievement: 02/11/15 Potential to Achieve Goals: Good Progress towards PT goals: Progressing toward goals    Frequency  Min 3X/week    PT Plan Current plan remains appropriate    Co-evaluation             End of Session Equipment Utilized During Treatment: Gait belt Activity Tolerance: Patient tolerated treatment well Patient left: in chair;with call bell/phone within reach;with family/visitor present     Time: 1610-9604 PT Time Calculation (min) (ACUTE ONLY): 28 min  Charges:  $Gait Training: 23-37 mins                    G Codes:     Lyanne Co, PT  Acute Rehab Services  [phone]  Lyanne Co 01/30/2015, 11:25 AM

## 2015-01-30 NOTE — Progress Notes (Signed)
Subjective: Plans to transfer pt to regular bed today. Complaints of abdominal distension, but not tender. Last bowel movement was yesterday without blood, but loose, none today. No vomiting or nausea, appetite not back to normal. Weight 5/9- 176, today- 187.  Objective: Vital signs in last 24 hours: Filed Vitals:   01/29/15 2300 01/30/15 0300 01/30/15 0400 01/30/15 0800  BP:  110/49 115/67 120/61  Pulse:      Temp: 98.3 F (36.8 C) 98 F (36.7 C)  97.8 F (36.6 C)  TempSrc: Oral Oral  Oral  Resp: Height:      Weight:      SpO2: 95% 95%  96%   Weight change:   Intake/Output Summary (Last 24 hours) at 01/30/15 9604 Last data filed at 01/30/15 0114  Gross per 24 hour  Intake    300 ml  Output      0 ml  Net    300 ml   General appearance: Sitting in recliner, family at bedside- wife and son in Social worker.  Head: Normocephalic, without obvious abnormality, atraumatic Lungs: Minimal Crackles, at the bases. Heart: S1 S2, no murmurs, rubs or gallops apprecaited.  Abdomen: Appears slightly distended, not tender Extremities: +1 pitting pedal edema bilat, to knees, also arms appear slightly puffy.    Tele- Rates- 70s- 80s.  Lab Results: Basic Metabolic Panel:  Recent Labs Lab 01/27/15 0400 01/28/15 0320 01/29/15 0052 01/29/15 0103  NA 147* 145 144  --   K 3.6 3.8 4.1  --   CL 117* 116* 116*  --   CO2 23 23 20*  --   GLUCOSE 146* 127* 218*  --   BUN 26* 27* 27*  --   CREATININE 1.80* 1.79* 1.84*  --   CALCIUM 6.7* 6.7* 6.8*  --   MG 1.8 2.1  --   --   PHOS  --   --   --  4.1   Liver Function Tests:  Recent Labs Lab 01/28/15 0320 01/29/15 0052  AST 95* 63*  ALT 73* 57  ALKPHOS 61 63  BILITOT 1.3* 1.1  PROT 4.6* 5.1*  ALBUMIN 1.3* 1.4*    Recent Labs Lab 01/24/15 1400 01/25/15 0440 01/27/15 0400  LIPASE 13* 16* 15*  AMYLASE 45  --   --    CBC:  Recent Labs Lab 01/27/15 0400 01/28/15 0320 01/28/15 1825 01/29/15 0052  WBC 13.9* 10.9*  12.7* 11.2*  NEUTROABS 11.8* 9.0*  --   --   HGB 8.9* 8.5* 9.3* 8.7*  HCT 27.3* 26.5* 29.5* 27.6*  MCV 90.7 91.1 91.6 92.0  PLT 175 181 216 201   CBG:  Recent Labs Lab 01/28/15 2134 01/29/15 0748 01/29/15 1151 01/29/15 1645 01/29/15 2139 01/30/15 0823  GLUCAP 159* 311* 181* 185* 225* 260*   Coagulation: No results for input(s): LABPROT, INR in the last 168 hours. Micro Results: Recent Results (from the past 240 hour(s))  Culture, blood (routine x 2)     Status: None   Collection Time: 01/21/15  9:30 AM  Result Value Ref Range Status   Specimen Description BLOOD RIGHT ANTECUBITAL  Final   Special Requests BOTTLES DRAWN AEROBIC AND ANAEROBIC 10CC  Final   Culture   Final    NO GROWTH 5 DAYS Performed at [facility]    Report Status 01/27/2015 FINAL  Final  Culture, blood (routine x 2)     Status: None   Collection Time: 01/21/15  9:40 AM  Result  Value Ref Range Status   Specimen Description BLOOD RIGHT ARM  Final   Special Requests BOTTLES DRAWN AEROBIC ONLY 5CC  Final   Culture   Final    NO GROWTH 5 DAYS Performed at [facility]    Report Status 01/27/2015 FINAL  Final  Culture, respiratory (NON-Expectorated)     Status: None   Collection Time: 01/22/15 12:20 PM  Result Value Ref Range Status   Specimen Description TRACHEAL ASPIRATE  Final   Special Requests NONE  Final   Gram Stain   Final    FEW WBC PRESENT,BOTH PMN AND MONONUCLEAR NO SQUAMOUS EPITHELIAL CELLS SEEN NO ORGANISMS SEEN Performed at [facility]    Culture   Final    Non-Pathogenic Oropharyngeal-type Flora Isolated. Performed at [facility]    Report Status 01/24/2015 FINAL  Final  Urine culture     Status: None   Collection Time: 01/28/15  6:26 PM  Result Value Ref Range Status   Specimen Description URINE, CLEAN CATCH  Final   Special Requests Normal  Final   Colony Count NO GROWTH Performed at [facility]   Final   Culture NO  GROWTH Performed at [facility]   Final   Report Status 01/29/2015 FINAL  Final   Studies/Results: No results found. Medications: I have reviewed the patient's current medications. Scheduled Meds: . carvedilol  3.125 mg Oral BID WC  . enoxaparin (LOVENOX) injection  40 mg Subcutaneous Q24H  . feeding supplement (ENSURE ENLIVE)  237 mL Oral TID BM  . imipenem-cilastatin  250 mg Intravenous 4 times per day  . insulin aspart  0-20 Units Subcutaneous TID WC  . insulin glargine  8 Units Subcutaneous QHS  . latanoprost  1 drop Both Eyes QHS  . metoCLOPramide (REGLAN) injection  5 mg Intravenous 3 times per day  . pantoprazole (PROTONIX) IV  40 mg Intravenous Q12H  . ranolazine  500 mg Oral BID   Continuous Infusions: . sodium chloride 10 mL/hr at 01/30/15 0915   PRN Meds:.acetaminophen, bisacodyl, fentaNYL (SUBLIMAZE) injection, ondansetron (ZOFRAN) IV Assessment/Plan: Principal Problem:   Acute gallstone pancreatitis Active Problems:   Abdominal pain, epigastric   Nausea with vomiting   Elevated LFTs   Gallstones   Syncope   Hypotension   Bradycardia   AKI (acute kidney injury)   Diabetes   Pancreatitis   NSTEMI (non-ST elevated myocardial infarction)   Cardiomyopathy   Choledocholithiasis   Hypocalcemia   Ileus   Acute biliary pancreatitis   Leukocytosis   Syncope and collapse   Acute systolic congestive heart failure   Essential hypertension   Other specified hypotension   Acute kidney injury   Diabetes type 2, uncontrolled   CAD, multiple vessel: CTO of RCA, Cx with severe prox LAD disease   Calculus of gallbladder with acute cholecystitis without obstruction   Cholecystitis, acute   Metabolic acidosis   Acute systolic CHF (congestive heart failure)   Acute renal failure syndrome   Acute blood loss anemia   Hemorrhagic shock   Acute respiratory failure with hypoxemia   Septic shock   Ulcerative esophagitis   Hematemesis with nausea   Melena    Demand ischemia of myocardium   Erosive esophagitis   HCAP (healthcare-associated pneumonia)   Shock   DVT (deep venous thrombosis)   Cardiomyopathy, ischemic   Ischemic cardiomyopathy  1. Hemorrhagic Shock/Acute Blood Loss Anemia- resolved, S/p 5 units of PRBC. Hgb appears stable- 8.5- 5/25, No more bloody  bowel movements.. Restarted Lovenox-  daily.  - Per primary  2. CAD- Multi-vessel dx on Cath- 01/17/2015, planned for CABG when acute issues resolve.Severe native vessel coronary disease with total occlusion of the RCA, total occlusion of the proximal circumflex, and severe proximal LAD disease.  - CVTS Recs appreciated, awaiting resolution of acute medical problems, and improvement in physical status - Not on asa 81, can resume soon, on Lovenox now for DVT ppx - Pt stable for transfer, and can be Transfered to 2W, while awaiting CABG  - Cont carvedilol and ranexa, uptitrate Coreg as tolerated   3. Acute systolic HF: EF 30 - 40%.  - Now on low-dose carvedilol. -  No ACE/ARB due to renal failure.  - Consideration to Start low-dose hydral-nitrates, will hold for now. Ranolaxine with Imdur can be used together, and is considered safe. - Might benefit from low dose of PO lasix-  daily- Weight up- about 11 Lbs since admission, appears slightly puffy with distended, but likely all from low albumin rather that heart failure.  4. Acute LE DVTs- Off systemic heparin due to acute severe GI bleed, required 5units of blood. Back on low-dose enoxaparin to prevent extension of DVT. Consider starting Anticoag when stable. - S/p IVC filter placement. - ON Lovenox-  daily   5. Acute Pancreatitis with Acute calculous cholecystitis- Improving.  - On primaxin - Per TRH  6. AKI on CKD- Improving, Cr- 1.84 01/30/2015. Urine output appears to have slowed down. Cr on admission- 1.5, likely baseline, no prior to compare.  - Consider low dose lasix-  daily  7. Erosive Esophagitis- Appears  stable.  - management per primary  8. Diabetes- On SSI  - Glucerna Shakes.   Mariea Clonts, [place],MD PGY-2, IMTS- resident. 9:36 AM 01/28/2015  Attending Note:   The patient was seen and examined.  Agree with assessment and plan as noted above.  Changes made to the above note as needed.  He is stable from a cardiac standpoint. Needs to eat more.  Will try some lasix to see if reducing his volume helps reduce liver congestion and helps his appetite.  No further recs.   Vesta Mixer, Montez Hageman., MD, [facility] 01/30/2015, 10:50 AM [address],  [address] Office [phone] Pager [phone]

## 2015-01-30 NOTE — Progress Notes (Signed)
Inpatient Diabetes Program Recommendations  AACE/ADA: New Consensus Statement on Inpatient Glycemic Control (2013)  Target Ranges:  Prepandial:   less than 140 mg/dL      Peak postprandial:   less than 180 mg/dL (1-2 hours)      Critically ill patients:  140 - 180 mg/dL   Reason for Assessment: Hyperglycemia  Results for [name] [name], [name] [name] (MRN [id number]) as of 01/30/2015 13:24  Ref. Range 01/29/2015 21:39 01/30/2015 08:23 01/30/2015 12:24  Glucose-Capillary Latest Ref Range: 65-99 mg/dL 841 (H) 660 (H) 630 (H)    Inpatient Diabetes Program Recommendations Insulin - Basal: Increase Lantus to 10 units QHS Insulin - Meal Coverage: May need small amount of meal coverage insulin - Novolog 3 units tidwc if pt eats > 50%  Diet: Please add CHO mod med to soft diet.  Will continue to follow. Thank you. Ailene Ards, RD, LDN, CDE Inpatient Diabetes Coordinator [phone]

## 2015-01-30 NOTE — Care Management Note (Signed)
Case Management Note  Patient Details  Name: [name] [name] MRN: [id number] Date of Birth: 25-Oct-1940  Subjective/Objective:                    Action/Plan:   Expected Discharge Date:                  Expected Discharge Plan:  Home w Home Health Services  In-House Referral:  Clinical Social Work  Discharge planning Services     Post Acute Care Choice:    Choice offered to:     DME Arranged:    DME Agency:     HH Arranged:    HH Agency:     Status of Service:     Medicare Important Message Given:  Yes Date Medicare IM Given:  01/28/15 Medicare IM give by:  debbie Pietro Bonura rn,bsn Date Additional Medicare IM Given:    Additional Medicare Important Message give by:     If discussed at Long Length of Stay Meetings, dates discussed:  01/31/15  Additional Comments:  Hanley Hays, RN 01/30/2015, 10:13 AM

## 2015-01-30 NOTE — Progress Notes (Signed)
Speech Language Pathology Treatment: Dysphagia  Patient Details Name: [name] [name] MRN: [id number] DOB: 12-21-1940 Today's Date: 01/30/2015 Time: 7915-0569 SLP Time Calculation (min) (ACUTE ONLY): 27 min  Assessment / Plan / Recommendation Clinical Impression  Pt observed with graham cracker due to MD placing pt on "soft" diet (initially purees recommended following MBS). No pocketing observed and adequate transit. He affirms intermittent difficulty with meats and globus sensation. No s/s aspiration present. He stated compensatory strategies independently but needed moderate verbal reminders to demonstrate. Continue soft texture and thin; close to meeting swallow goals.   HPI Other Pertinent Information: 74 y/o M admitted 5/8 after a syncopal episode and found to have acute pancreatitis. Also positive troponin/NSTEMI. EGD shows grade 4 esophagitis with deep ulcers, eschar, friability, blood clots in stomach. Intubated from 5/16 to 5/18. Pt reprotedly having difficulty swallowing dinner on 5/18.    Pertinent Vitals Pain Assessment: Faces Faces Pain Scale: Hurts a little bit Pain Location: abdomen Pain Descriptors / Indicators: Tightness Pain Intervention(s): Monitored during session  SLP Plan  Continue with current plan of care    Recommendations Diet recommendations: Dysphagia 3 (mechanical soft);Thin liquid ("soft") Liquids provided via: Cup;Straw Medication Administration: Crushed with puree Supervision: Patient able to self feed;Full supervision/cueing for compensatory strategies Compensations: Small sips/bites;Slow rate;Multiple dry swallows after each bite/sip;Follow solids with liquid Postural Changes and/or Swallow Maneuvers: Seated upright 90 degrees;Upright 30-60 min after meal              Oral Care Recommendations: Oral care BID Follow up Recommendations:  (TBD) Plan: Continue with current plan of care    GO     Royce Macadamia 01/30/2015, 11:37 AM  Breck Coons Lonell Face.Ed [employer] [phone]

## 2015-01-30 NOTE — Progress Notes (Signed)
Occupational Therapy Treatment Patient Details Name: [name] [name] MRN: [id number] DOB: Feb 28, 1941 Today's Date: 01/30/2015    History of present illness Admitted with epigastric pain significant; also with NSTEMI; Cardiac cath revealed total RCA, total prox cfx, severe prox LAD, 50% dz of dominant diagonal - Cards following - will need eventual CABG - TCTS now following - patient will need to fully recover from his severe acute pancreatitis prior to surgery. Increased WOB and GIB with pt transferred to ICU and intubated 5/16 to 5/18. Resident requesting bed to chair only on 5/19 until cleared by cardiology for ambulation.   OT comments  Pt readily willing to work with OT. Continues to demonstrate impaired cognition requiring step by step cues for mobility and ADL.  Fatigues with donning and doffing socks.  Will benefit from instruction in use of AE.  Able to release walker to stand momentarily in preparation for standing ADL.  Pt is faithful to UE AROM exercises.  Follow Up Recommendations  Supervision/Assistance - 24 hour;SNF    Equipment Recommendations       Recommendations for Other Services      Precautions / Restrictions Precautions Precautions: Fall       Mobility Bed Mobility Overal bed mobility: Needs Assistance Bed Mobility: Sit to Supine       Sit to supine: Min guard   General bed mobility comments: increased time, but no physical assist to lift LEs up in chair  Transfers   Equipment used: Rolling walker (2 wheeled) Transfers: Sit to/from Stand Sit to Stand: Min guard         General transfer comment: verbal cues for hand placement and technique and to control descent to bed    Balance             Standing balance-Leahy Scale: Fair Standing balance comment: worked on release of walker in standing as a precursor to standing ADL                    ADL Overall ADL's : Needs assistance/impaired Eating/Feeding: Sitting;Set up Eating/Feeding  Details (indicate cue type and reason): family assists to get pt to consume more Grooming: Wash/dry hands;Set up;Sitting           Upper Body Dressing : Sitting;Minimal assistance Upper Body Dressing Details (indicate cue type and reason): removed front opening gown and changed other gown due to incontinence, pt needing step by step cues Lower Body Dressing: Moderate assistance;Sit to/from stand Lower Body Dressing Details (indicate cue type and reason): doffed socks pulling up LE to hand, not able to cross foot over opposite knee to donn, but could pull socks up once started over toes Toilet Transfer: Min guard;RW   Toileting- Clothing Manipulation and Hygiene: Maximal assistance;Sit to/from stand Toileting - Clothing Manipulation Details (indicate cue type and reason): assisted to wipe skin after urinary incontinence in standing     Functional mobility during ADLs: Min guard;Rolling walker General ADL Comments: Pt fatigues with LB dressing, will instruct in use of AE. Began instruction in energy conservation.      Vision                     Perception     Praxis      Cognition   Behavior During Therapy: [facility] for tasks assessed/performed Overall Cognitive Status: Impaired/Different from baseline Area of Impairment: Problem solving;Following commands     Memory: Decreased short-term memory  Following Commands: Follows one step commands with increased time;Follows multi-step  commands with increased time     Problem Solving: Slow processing;Requires verbal cues General Comments: multiple cues needed and pt with overall dampened responses    Extremity/Trunk Assessment               Exercises     Shoulder Instructions       General Comments      Pertinent Vitals/ Pain       Pain Assessment: Faces Faces Pain Scale: Hurts a little bit Pain Location: L side of abdomen Pain Descriptors / Indicators: Aching Pain Intervention(s): Monitored during  session;Limited activity within patient's tolerance  Home Living                                          Prior Functioning/Environment              Frequency Min 2X/week     Progress Toward Goals  OT Goals(current goals can now be found in the care plan section)  Progress towards OT goals: Progressing toward goals  Acute Rehab OT Goals Patient Stated Goal: to recover well enough to have his CABG surgery  Plan Discharge plan remains appropriate    Co-evaluation                 End of Session Equipment Utilized During Treatment: Rolling walker;Gait belt   Activity Tolerance Patient limited by fatigue   Patient Left in bed;with call bell/phone within reach;with nursing/sitter in room   Nurse Communication Other (comment) (aware of condom cath off)        Time: 1610-9604 OT Time Calculation (min): 34 min  Charges: OT General Charges $OT Visit: 1 Procedure OT Treatments $Self Care/Home Management : 23-37 mins  Evern Bio 01/30/2015, 3:20 PM  [phone]

## 2015-01-31 ENCOUNTER — Encounter (HOSPITAL_COMMUNITY): Payer: Self-pay | Admitting: General Surgery

## 2015-01-31 DIAGNOSIS — E1165 Type 2 diabetes mellitus with hyperglycemia: Secondary | ICD-10-CM

## 2015-01-31 LAB — COMPREHENSIVE METABOLIC PANEL
ALT: 47 U/L (ref 17–63)
ANION GAP: 9 (ref 5–15)
AST: 63 U/L — AB (ref 15–41)
Albumin: 1.5 g/dL — ABNORMAL LOW (ref 3.5–5.0)
Alkaline Phosphatase: 78 U/L (ref 38–126)
BILIRUBIN TOTAL: 1 mg/dL (ref 0.3–1.2)
BUN: 27 mg/dL — AB (ref 6–20)
CHLORIDE: 112 mmol/L — AB (ref 101–111)
CO2: 21 mmol/L — ABNORMAL LOW (ref 22–32)
CREATININE: 1.69 mg/dL — AB (ref 0.61–1.24)
Calcium: 7.2 mg/dL — ABNORMAL LOW (ref 8.9–10.3)
GFR, EST AFRICAN AMERICAN: 45 mL/min — AB (ref >60)
GFR, EST NON AFRICAN AMERICAN: 39 mL/min — AB (ref >60)
Glucose, Bld: 170 mg/dL — ABNORMAL HIGH (ref 65–99)
POTASSIUM: 3.8 mmol/L (ref 3.5–5.1)
Sodium: 142 mmol/L (ref 135–145)
Total Protein: 5.4 g/dL — ABNORMAL LOW (ref 6.5–8.1)

## 2015-01-31 LAB — CBC
HCT: 29.8 % — ABNORMAL LOW (ref 39.0–52.0)
Hemoglobin: 9.4 g/dL — ABNORMAL LOW (ref 13.0–17.0)
MCH: 29 pg (ref 26.0–34.0)
MCHC: 31.5 g/dL (ref 30.0–36.0)
MCV: 92 fL (ref 78.0–100.0)
PLATELETS: 197 10*3/uL (ref 150–400)
RBC: 3.24 MIL/uL — AB (ref 4.22–5.81)
RDW: 14.1 % (ref 11.5–15.5)
WBC: 10.2 10*3/uL (ref 4.0–10.5)

## 2015-01-31 LAB — GLUCOSE, CAPILLARY
GLUCOSE-CAPILLARY: 161 mg/dL — AB (ref 65–99)
Glucose-Capillary: 142 mg/dL — ABNORMAL HIGH (ref 65–99)
Glucose-Capillary: 165 mg/dL — ABNORMAL HIGH (ref 65–99)
Glucose-Capillary: 184 mg/dL — ABNORMAL HIGH (ref 65–99)

## 2015-01-31 MED ORDER — PANTOPRAZOLE SODIUM 40 MG PO TBEC
40.0000 mg | DELAYED_RELEASE_TABLET | Freq: Two times a day (BID) | ORAL | Status: DC
  Filled 2015-01-31 (×2): qty 1

## 2015-01-31 MED ORDER — SACCHAROMYCES BOULARDII 250 MG PO CAPS
250.0000 mg | ORAL_CAPSULE | Freq: Two times a day (BID) | ORAL | Status: DC
  Administered 2015-01-31 – 2015-02-21 (×37): 250 mg via ORAL
  Filled 2015-01-31 (×48): qty 1

## 2015-01-31 MED ORDER — PANTOPRAZOLE SODIUM 40 MG PO PACK
40.0000 mg | PACK | Freq: Two times a day (BID) | ORAL | Status: DC
  Administered 2015-01-31 – 2015-02-08 (×15): 40 mg via ORAL
  Filled 2015-01-31 (×25): qty 20

## 2015-01-31 NOTE — Care Management (Signed)
Medicare Important Message given? Yes (If Response is "NO", the following Medicare IM given date fields will be blank) Date Medicare IM given: 01/31/15 Medicare IM given by: Raynald Blend

## 2015-01-31 NOTE — Progress Notes (Signed)
CSW received consult for SNF placement.  Per MD report/notes patient is too high risk to go to SNF at this time.  CSW signing off for now- please reconsult if patient becomes appropriate for SNF placement.  Merlyn Lot, LCSWA Clinical Social Worker [phone]

## 2015-01-31 NOTE — Progress Notes (Signed)
Subjective: Plans to transfer pt to regular bed today. Complaints of abdominal distension, but not tender. Last bowel movement was yesterday without blood, but loose, none today. No vomiting or nausea, appetite not back to normal. Weight 5/9- 176, today- 187.  Eating chicken and dumplings this am for breakfast - brought from home  Objective: Vital signs in last 24 hours: Filed Vitals:   01/30/15 1841 01/30/15 2052 01/30/15 2053 01/31/15 0553  BP: 121/59 107/55  112/58  Pulse: 88 37 81 72  Temp: 99.6 F (37.6 C) 98.9 F (37.2 C)  97.6 F (36.4 C)  TempSrc: Oral Oral  Oral  Resp: Height:      Weight:      SpO2: 94% 97%  99%   Weight change:   Intake/Output Summary (Last 24 hours) at 01/31/15 0944 Last data filed at 01/31/15 0915  Gross per 24 hour  Intake  637.5 ml  Output   1150 ml  Net -512.5 ml   General appearance: Sitting in recliner, family at bedside- wife and son in Social worker.  Head: Normocephalic, without obvious abnormality, atraumatic Lungs: Minimal Crackles, at the bases. Heart: S1 S2, no murmurs, rubs or gallops apprecaited.  Abdomen: nontender  Extremities: +1 pitting pedal edema bilat, to knees, also arms appear slightly puffy.    Tele- Rates- 70s- 80s.  Lab Results: Basic Metabolic Panel:  Recent Labs Lab 01/27/15 0400 01/28/15 0320 01/29/15 0052 01/29/15 0103 01/31/15 0429  NA 147* 145 144  --  142  K 3.6 3.8 4.1  --  3.8  CL 117* 116* 116*  --  112*  CO2 23 23 20*  --  21*  GLUCOSE 146* 127* 218*  --  170*  BUN 26* 27* 27*  --  27*  CREATININE 1.80* 1.79* 1.84*  --  1.69*  CALCIUM 6.7* 6.7* 6.8*  --  7.2*  MG 1.8 2.1  --   --   --   PHOS  --   --   --  4.1  --    Liver Function Tests:  Recent Labs Lab 01/29/15 0052 01/31/15 0429  AST 63* 63*  ALT 57 47  ALKPHOS 63 78  BILITOT 1.1 1.0  PROT 5.1* 5.4*  ALBUMIN 1.4* 1.5*    Recent Labs Lab 01/24/15 1400 01/25/15 0440 01/27/15 0400  LIPASE 13* 16* 15*  AMYLASE 45  --    --    CBC:  Recent Labs Lab 01/27/15 0400 01/28/15 0320  01/29/15 0052 01/31/15 0429  WBC 13.9* 10.9*  < > 11.2* 10.2  NEUTROABS 11.8* 9.0*  --   --   --   HGB 8.9* 8.5*  < > 8.7* 9.4*  HCT 27.3* 26.5*  < > 27.6* 29.8*  MCV 90.7 91.1  < > 92.0 92.0  PLT 175 181  < > 201 197  < > = values in this interval not displayed. CBG:  Recent Labs Lab 01/29/15 2139 01/30/15 0823 01/30/15 1224 01/30/15 1718 01/30/15 2118 01/31/15 0545  GLUCAP 225* 260* 229* 140* 139* 142*   Coagulation: No results for input(s): LABPROT, INR in the last 168 hours. Micro Results: Recent Results (from the past 240 hour(s))  Culture, respiratory (NON-Expectorated)     Status: None   Collection Time: 01/22/15 12:20 PM  Result Value Ref Range Status   Specimen Description TRACHEAL ASPIRATE  Final   Special Requests NONE  Final   Gram Stain   Final    FEW WBC PRESENT,BOTH  PMN AND MONONUCLEAR NO SQUAMOUS EPITHELIAL CELLS SEEN NO ORGANISMS SEEN Performed at [facility]    Culture   Final    Non-Pathogenic Oropharyngeal-type Flora Isolated. Performed at [facility]    Report Status 01/24/2015 FINAL  Final  Urine culture     Status: None   Collection Time: 01/28/15  6:26 PM  Result Value Ref Range Status   Specimen Description URINE, CLEAN CATCH  Final   Special Requests Normal  Final   Colony Count NO GROWTH Performed at [facility]   Final   Culture NO GROWTH Performed at [facility]   Final   Report Status 01/29/2015 FINAL  Final   Studies/Results: No results found. Medications: I have reviewed the patient's current medications. Scheduled Meds: . carvedilol  3.125 mg Oral BID WC  . enoxaparin (LOVENOX) injection  40 mg Subcutaneous Q24H  . feeding supplement (ENSURE ENLIVE)  237 mL Oral TID BM  . furosemide  40 mg Oral Daily  . insulin aspart  0-20 Units Subcutaneous TID WC  . insulin glargine  12 Units Subcutaneous QHS  . latanoprost  1 drop  Both Eyes QHS  . metoCLOPramide (REGLAN) injection  5 mg Intravenous 3 times per day  . pantoprazole  40 mg Oral BID  . ranolazine  500 mg Oral BID  . saccharomyces boulardii  250 mg Oral BID   Continuous Infusions:   PRN Meds:.acetaminophen, bisacodyl, fentaNYL (SUBLIMAZE) injection, ondansetron (ZOFRAN) IV Assessment/Plan: Principal Problem:   Acute gallstone pancreatitis Active Problems:   Abdominal pain, epigastric   Nausea with vomiting   Elevated LFTs   Gallstones   Syncope   Hypotension   Bradycardia   AKI (acute kidney injury)   Diabetes   Pancreatitis   NSTEMI (non-ST elevated myocardial infarction)   Cardiomyopathy   Choledocholithiasis   Hypocalcemia   Ileus   Acute biliary pancreatitis   Leukocytosis   Syncope and collapse   Acute systolic congestive heart failure   Essential hypertension   Other specified hypotension   Acute kidney injury   Diabetes type 2, uncontrolled   CAD, multiple vessel: CTO of RCA, Cx with severe prox LAD disease   Calculus of gallbladder with acute cholecystitis without obstruction   Cholecystitis, acute   Metabolic acidosis   Acute systolic CHF (congestive heart failure)   Acute renal failure syndrome   Acute blood loss anemia   Hemorrhagic shock   Acute respiratory failure with hypoxemia   Septic shock   Ulcerative esophagitis   Hematemesis with nausea   Melena   Demand ischemia of myocardium   Erosive esophagitis   HCAP (healthcare-associated pneumonia)   Shock   DVT (deep venous thrombosis)   Cardiomyopathy, ischemic   Ischemic cardiomyopathy  1. Hemorrhagic Shock/Acute Blood Loss Anemia- resolved, S/p 5 units of PRBC. Hgb appears stable- 8.5- 5/25, No more bloody bowel movements.. Restarted Lovenox-  daily.  - Per primary  2. CAD- Multi-vessel dx on Cath- 01/17/2015, planned for CABG when acute issues resolve.Severe native vessel coronary disease with total occlusion of the RCA, total occlusion of the proximal  circumflex, and severe proximal LAD disease.  - CVTS Recs appreciated, awaiting resolution of acute medical problems, and improvement in physical status - Not on asa 81, can resume soon, on Lovenox now for DVT ppx - Pt stable for transfer, and can be Transfered to 2W, while awaiting CABG  - Cont carvedilol and ranexa, uptitrate Coreg as tolerated   3. Acute systolic HF:  EF 30 - 40%.  - Now on low-dose carvedilol. -  No ACE/ARB due to renal failure.  - Consideration to Start low-dose hydral-nitrates, will hold for now. Ranolaxine with Imdur can be used together, and is considered safe. - Might benefit from low dose of PO lasix- 40mg  daily- Weight up- about 11 Lbs since admission, appears slightly puffy with distended, but likely all from low albumin rather that heart failure.  4. Acute LE DVTs- Off systemic heparin due to acute severe GI bleed, required 5units of blood. Back on low-dose enoxaparin to prevent extension of DVT. Consider starting Anticoag when stable. - S/p IVC filter placement. - ON Lovenox- 40mg  daily   5. Acute Pancreatitis with Acute calculous cholecystitis- Improving.  - On primaxin - Per TRH  6. AKI on CKD- Improving, Cr- 1.84 01/30/2015. Urine output appears to have slowed down. Cr on admission- 1.5, likely baseline, no prior to compare.  - Consider low dose lasix- 40mg  daily      Ysabel Stankovich, Deloris Ping, MD  01/31/2015 9:49 AM    [facility] [address] [place],  [address] [place], [place]  [address] Pager [phone] Phone: [phone]; Fax: [phone]   [facility]  [address] [place], [place]  [address] [phone]    Fax [phone]

## 2015-01-31 NOTE — Consult Note (Signed)
Reason for Consult: biliary pancreatitis  Referring Physician: Dr. Charlynne Cousins    HPI: [name] [name] is a 74 year old male with a history of hypertension and diabetes mellitus who was admitted on 01/13/15 following a syncopal episode while he was at church.  CT of abdomen and pelvis at [facility] showed cholelithiasis and acute pancreatitis.  Also had a lipse of 1230. Positive troponins. He was transferred to [facility].  He was found to have a NSTEMI with LHC revealing triple vessel disease.  He had hemorrhagic shock secondary to esophagitis and deep ulcers/friable mucosa seen on EGD and was also on a heparin drip.  Bled to a h&h of 6.8/19.7. He was also noted to have bilateral lower extremity DVT and had an IVC filter placed.   Abnormal LFTs which now have normalized.  Lipase has now normalized.  Prealbumin on 5/24 4.3.  The patient reports being in normal health prior to admission.  Appetite was normal.  Denies any significant weight loss.  Denies previous episodes of pancreatitis.  He alcohol use.  He has known gallstones for 25 plus years but have never been bothersome.  He reports nausea initially, but no abdominal pain.  Today, he reports feeling "tight," but denies any abdominal pain, nausea or vomiting.  LFTs are essentially normal.  sCr down to 1.69.  H&h are stable at 9.4/29.8.  He is tolerating a soft diet.    5/9 echocardiogram-LVEF 30-40%.  Akinesis of basal midinferior myocardium.  Severe hypokineses entirelateral myocardium 5/10 MRCP--acute pancreatitis. Cholelithiasis, potential small 39m filling defect mid CBD. 5/12 severe native vessel coronary disease total occlusion to RCA, proximal circ and proximal LAD 5/13 CT abdomen/pelvis-acute pancreatitis  5/16 EGD--severe esophagitis with deep ulcers, eschar and friability. 5/19 IVC filter placed  Past Medical History  Diagnosis Date  . Hypertension   . Diabetes mellitus type 2 in obese   . Cholelithiasis     Past Surgical History   Procedure Laterality Date  . Esophagogastroduodenoscopy N/A 01/21/2015    Procedure: ESOPHAGOGASTRODUODENOSCOPY (EGD);  Surgeon: JIrene Shipper MD;  [facility];  Service: Endoscopy;  Laterality: N/A;  [facility] . Cardiac catheterization N/A 01/17/2015    Procedure: Left Heart Cath and Coronary Angiography;  Surgeon: HBelva Crome MD;  [facility];  Service: Cardiovascular;  Laterality: N/A;    Family History  Problem Relation Age of Onset  . Heart attack Brother 525 . Pancreatic cancer Sister 746   died in her 767s     Social History:  reports that he has never smoked. He does not have any smokeless tobacco history on file. He reports that he does not drink alcohol or use illicit drugs.  Allergies: No Known Allergies   Medication: Scheduled Meds: . carvedilol  3.125 mg Oral BID WC  . enoxaparin (LOVENOX) injection  40 mg Subcutaneous Q24H  . feeding supplement (ENSURE ENLIVE)  237 mL Oral TID BM  . furosemide  40 mg Oral Daily  . insulin aspart  0-20 Units Subcutaneous TID WC  . insulin glargine  12 Units Subcutaneous QHS  . latanoprost  1 drop Both Eyes QHS  . metoCLOPramide (REGLAN) injection  5 mg Intravenous 3 times per day  . pantoprazole  40 mg Oral BID  . ranolazine  500 mg Oral BID  . saccharomyces boulardii  250 mg Oral BID   Continuous Infusions:  PRN Meds:.acetaminophen, bisacodyl, fentaNYL (SUBLIMAZE) injection, ondansetron (ZOFRAN) IV   Results  for orders placed or performed during the hospital encounter of 01/13/15 (from the past 48 hour(s))  Glucose, capillary     Status: Abnormal   Collection Time: 01/29/15 11:51 AM  Result Value Ref Range   Glucose-Capillary 181 (H) 65 - 99 mg/dL   Comment 1 Capillary Specimen   Glucose, capillary     Status: Abnormal   Collection Time: 01/29/15  4:45 PM  Result Value Ref Range   Glucose-Capillary 185 (H) 65 - 99 mg/dL   Comment 1 Capillary Specimen   Glucose, capillary      Status: Abnormal   Collection Time: 01/29/15  9:39 PM  Result Value Ref Range   Glucose-Capillary 225 (H) 65 - 99 mg/dL   Comment 1 Capillary Specimen   Glucose, capillary     Status: Abnormal   Collection Time: 01/30/15  8:23 AM  Result Value Ref Range   Glucose-Capillary 260 (H) 65 - 99 mg/dL   Comment 1 Capillary Specimen   Glucose, capillary     Status: Abnormal   Collection Time: 01/30/15 12:24 PM  Result Value Ref Range   Glucose-Capillary 229 (H) 65 - 99 mg/dL   Comment 1 Capillary Specimen   Glucose, capillary     Status: Abnormal   Collection Time: 01/30/15  5:18 PM  Result Value Ref Range   Glucose-Capillary 140 (H) 65 - 99 mg/dL   Comment 1 Capillary Specimen   Glucose, capillary     Status: Abnormal   Collection Time: 01/30/15  9:18 PM  Result Value Ref Range   Glucose-Capillary 139 (H) 65 - 99 mg/dL  CBC     Status: Abnormal   Collection Time: 01/31/15  4:29 AM  Result Value Ref Range   WBC 10.2 4.0 - 10.5 K/uL   RBC 3.24 (L) 4.22 - 5.81 MIL/uL   Hemoglobin 9.4 (L) 13.0 - 17.0 g/dL   HCT 29.8 (L) 39.0 - 52.0 %   MCV 92.0 78.0 - 100.0 fL   MCH 29.0 26.0 - 34.0 pg   MCHC 31.5 30.0 - 36.0 g/dL   RDW 14.1 11.5 - 15.5 %   Platelets 197 150 - 400 K/uL  Comprehensive metabolic panel     Status: Abnormal   Collection Time: 01/31/15  4:29 AM  Result Value Ref Range   Sodium 142 135 - 145 mmol/L   Potassium 3.8 3.5 - 5.1 mmol/L   Chloride 112 (H) 101 - 111 mmol/L   CO2 21 (L) 22 - 32 mmol/L   Glucose, Bld 170 (H) 65 - 99 mg/dL   BUN 27 (H) 6 - 20 mg/dL   Creatinine, Ser 1.69 (H) 0.61 - 1.24 mg/dL   Calcium 7.2 (L) 8.9 - 10.3 mg/dL   Total Protein 5.4 (L) 6.5 - 8.1 g/dL   Albumin 1.5 (L) 3.5 - 5.0 g/dL   AST 63 (H) 15 - 41 U/L   ALT 47 17 - 63 U/L   Alkaline Phosphatase 78 38 - 126 U/L   Total Bilirubin 1.0 0.3 - 1.2 mg/dL   GFR calc non Af Amer 39 (L) >60 mL/min   GFR calc Af Amer 45 (L) >60 mL/min    Comment: (NOTE) The eGFR has been calculated using the  CKD EPI equation. This calculation has not been validated in all clinical situations. eGFR's persistently <60 mL/min signify possible Chronic Kidney Disease.    Anion gap 9 5 - 15  Glucose, capillary     Status: Abnormal   Collection Time: 01/31/15  5:45  AM  Result Value Ref Range   Glucose-Capillary 142 (H) 65 - 99 mg/dL    No results found.  Review of Systems  Constitutional: Positive for weight loss. Negative for fever, chills, malaise/fatigue and diaphoresis.  HENT: Negative.   Eyes: Negative.   Respiratory: Negative.   Cardiovascular: Negative.   Gastrointestinal: Positive for constipation. Negative for heartburn, nausea, vomiting, abdominal pain, diarrhea, blood in stool and melena.  Genitourinary: Negative.   Musculoskeletal: Negative.   Neurological: Negative.  Negative for weakness.  Psychiatric/Behavioral: Negative.    Blood pressure 112/58, pulse 72, temperature 97.6 F (36.4 C), temperature source Oral, resp. rate 18, height 6' (1.829 m), weight 85.2 kg (187 lb 13.3 oz), SpO2 99 %. Physical Exam  Constitutional: He is oriented to person, place, and time. He does not have a sickly appearance. No distress.  Cardiovascular: Normal rate, regular rhythm, normal heart sounds and intact distal pulses.  Exam reveals no gallop and no friction rub.   No murmur heard. Respiratory: Effort normal and breath sounds normal. No respiratory distress. He has no wheezes. He has no rales. He exhibits no tenderness.  GI: Soft. He exhibits distension. He exhibits no mass. There is no tenderness. There is no rebound and no guarding.  Hypoactive bowel sounds  Musculoskeletal:  +2-3 BLE pitting edema.  Neurological: He is alert and oriented to person, place, and time.  Skin: Skin is warm and dry. No rash noted. No erythema. No pallor.  Psychiatric: He has a normal mood and affect. His behavior is normal. Judgment and thought content normal.    Assessment/Plan: Gallstone  pancreatitis Choledocholithiasis  Given NSTEMI, protein calorie malnutrition, newly diagnosed triple vessel disease and DVTs, the patient is not a surgical candidate at this time.  Once he has recovered from the CABG, approximately 3-6 months, then he may be a surgical candidate for a cholecystectomy.  The patient understands that he may have recurrent pancreatitis and choledocholithiasis, however, surgical risks outweigh t he benefits.  He has an ileus, would recommend miralax/dulcolax in addition to adequate oral hydration and mobilization.  He may follow up with [facility] on outpatient basis.  Please do not hesitate to contact us with any further assistance.  Thank you for the consult.   Jasiah Elsen  ANP-BC  01/31/2015, 10:08 AM

## 2015-01-31 NOTE — Progress Notes (Signed)
[facility] PROGRESS NOTE Interim History: 74 year old male who presented to [facility] hospital for syncopal episode and bradycardia which was later attribute to vasovagal response. Would have persisting epigastric pain with nausea. In the ED at [facility] a CT scan of the abdomen and pelvis show very stone lipase was greater than 300 LFTs were elevated lactic acid of 2.2 with a creatinine 1.7 she was transferred to  [facility] hospital. 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 CT scan of the abdomen and pelvis showed edema around the pancreas compatible with pancreatitis no abscess choledocholithiasis with stone in common bile duct and pleural effusion. She didn't had hemorrhagic shock and she had to be several transfused unit of packed red blood cells. Transferred to the ICU for acute respiratory distress intubated and PCCM took over with a chest x-ray which revealed bilateral consolidation. An EGD was performed that showed a grade 4 esophagitis with deep ulcers and friable mucosa. Anticoagulants were held along with aspirin. She is only on Lovenox for DVT prophylaxis. Chest x-ray on 518 showed improving deviations she was extubated. Lower extremity Doppler done on 01/24/2015 showed a bilateral lower extremity DVTs 1 IVC filter was placed.   Assessment/Plan: Hemorrhagic shock/acute blood loss anemia due to erosive esophagitis: - Due to esophagitis plus anti-coagulation, now resolved status post 5 units of packed red blood cells. - Her hemoglobin appears to be stable which I keep hemoglobin greater than 8. Continue trend CBCs. - Hemoglobin remained stable on full dose anticoagulation. - GI signed off on 01/18/2015, continue PPI twice a day there is no evidence of ongoing bleeding at this time.  Acute gallstone pancreatitis: MRCP suggested the possibility of a 4 mm stone  within the common bile duct. GI was consulted which recommended laparoscopic cholecystectomy with intraoperative cholangiogram. I will consult general surgery  NSTEMI with severe triple-vessel disease: Cardiac cath revealed severe triple-vessel disease, Cardiology and [facility] suggest the patient not leave the hospital though patient will need to fully recover from his severe acute pancreatitis prior to surgery.  Acute bilateral lower extremity DVT: She is currently on only prophylactic Lovenox due to hemorrhagic shock. IVC filter was placed. Continue to monitor CBC.  Ileus: - Patient having intermittent nausea and vomiting and abdominal distention recommend ambulation.  Acute kidney injury: Renal ultrasound was unrevealing creatinine stabilize probably new baseline approximately 1.6.  Hypocalcemia: Corrected calcium is actually 9. No further interventions.  Persisting hypokalemia: Corrected with supplementation.  Hypophosphatemia: Has normalized with supplementation.  Syncopal episode most likely vasovagal: No further episodes.  Bradycardia: Initially felt to be vagal mediated.  New diagnosis of acute systolic heart failure: ACE inhibitor due to acute kidney injury, he is currently on a beta blocker.  Thrombocytopenia  HIT panel negative - suspect due to acute inflam illness - no evidence of acute blood loss - platelet count has normalized  Hypotension > hypertension Hypotension resolved with volume expansion - blood pressure controlled  DM CBG poorly controlled with improving intake - adjust treatment and follow   Code Status: FULL Family Communication: No family present at time of exam today Disposition Plan: Insurance will not cover CIR - transfer to cardiac telemetry bed while awaiting CABG   Consultants:  [facility]  CVTS  Cardiology  Procedures: Significant Events: 5/08 Transfer from [place] after syncopal episode, gallstones, pancreatitis, elevated  troponin I, abnormal echocardiogram 5/09 TTE: EF 30-40%, akinesis of basal-midinferior myocardium -  severe hypokinesis lateral myocardium - grade 1 DD 5/12 LHC: severe 3 vessel disease  5/13 CT ABD/Pelvis: infiltration edema around pancreas, c/w acute pancreatitis, no abscess, cholelithiasis with stones in CBD, bilateral pleural effusions with compressive atx. 5/15 transfused one unit PRBCs 5/16 Transferred to ICU with acute respiratory distress. Intubated. PCCM consultation. CXR revealed BLL consolidation 5/16 transfused one unit PRBCs 5/16 EGD: grade 4 esophagitis with deep ulcers, eschar, friability, blood clllots in stomach, no gastric ulcers or mucosal abnormalities, mod edema of 2nd portion of duodenum c/w pancreatitis. PPI infusion initiated. Heparin disconitnued. OGT left out 5/17 transfused one unit PRBCs for Hgb 7.4 in setting of ACS 5/18 CXR with improved basilar aeration. Extubated 5/19 LE venous duplex scans + B LE DVT 5/19 IVC filter placed    Antibiotics: Zosyn 5/12 > 5/13 Vancomycin 5/16 > 5/19 Primaxin 5/16 > 5/25  HPI/Subjective: Tolerating diet  Objective: Filed Vitals:   01/30/15 1841 01/30/15 2052 01/30/15 2053 01/31/15 0553  BP: 121/59 107/55  112/58  Pulse: 88 37 81 72  Temp: 99.6 F (37.6 C) 98.9 F (37.2 C)  97.6 F (36.4 C)  TempSrc: Oral Oral  Oral  Resp: Height:      Weight:      SpO2: 94% 97%  99%    Intake/Output Summary (Last 24 hours) at 01/31/15 0948 Last data filed at 01/31/15 0915  Gross per 24 hour  Intake  637.5 ml  Output   1150 ml  Net -512.5 ml   Filed Weights   01/23/15 0600 01/24/15 0500 01/25/15 0400  Weight: 89.7 kg (197 lb 12 oz) 86.7 kg (191 lb 2.2 oz) 85.2 kg (187 lb 13.3 oz)    Exam:  General: Alert, awake, oriented x3, in no acute distress.  HEENT: No bruits, no goiter.  Heart: Regular rate and rhythm. Lungs: Good air movement, clear Abdomen: Soft, nontender, nondistended, positive bowel sounds.    Neuro: Grossly intact, nonfocal.   Data Reviewed: Basic Metabolic Panel:  Recent Labs Lab 01/26/15 0410 01/27/15 0400 01/28/15 0320 01/29/15 0052 01/29/15 0103 01/31/15 0429  NA 148* 147* 145 144  --  142  K 3.6 3.6 3.8 4.1  --  3.8  CL 116* 117* 116* 116*  --  112*  CO2 20*  --  21*  GLUCOSE 239* 146* 127* 218*  --  170*  BUN 31* 26* 27* 27*  --  27*  CREATININE 1.85* 1.80* 1.79* 1.84*  --  1.69*  CALCIUM 6.9* 6.7* 6.7* 6.8*  --  7.2*  MG  --  1.8 2.1  --   --   --   PHOS  --   --   --   --  4.1  --    Liver Function Tests:  Recent Labs Lab 01/26/15 0410 01/27/15 0400 01/28/15 0320 01/29/15 0052 01/31/15 0429  AST 149* 139* 95* 63* 63*  ALT 104* 94* 73* 57 47  ALKPHOS 85 74 61 63 78  BILITOT 1.5* 1.4* 1.3* 1.1 1.0  PROT 4.9* 5.1* 4.6* 5.1* 5.4*  ALBUMIN 1.3* 1.3* 1.3* 1.4* 1.5*    Recent Labs Lab 01/24/15 1400 01/25/15 0440 01/27/15 0400  LIPASE 13* 16* 15*  AMYLASE 45  --   --    No results for input(s): AMMONIA in the last 168 hours. CBC:  Recent Labs Lab 01/27/15 0400 01/28/15 0320 01/28/15 1825 01/29/15 0052 01/31/15 0429  WBC 13.9* 10.9* 12.7* 11.2* 10.2  NEUTROABS 11.8* 9.0*  --   --   --  HGB 8.9* 8.5* 9.3* 8.7* 9.4*  HCT 27.3* 26.5* 29.5* 27.6* 29.8*  MCV 90.7 91.1 91.6 92.0 92.0  PLT 175 181 216 201 197   Cardiac Enzymes: No results for input(s): CKTOTAL, CKMB, CKMBINDEX, TROPONINI in the last 168 hours. BNP (last 3 results) No results for input(s): BNP in the last 8760 hours.  ProBNP (last 3 results) No results for input(s): PROBNP in the last 8760 hours.  CBG:  Recent Labs Lab 01/30/15 0823 01/30/15 1224 01/30/15 1718 01/30/15 2118 01/31/15 0545  GLUCAP 260* 229* 140* 139* 142*    Recent Results (from the past 240 hour(s))  Culture, respiratory (NON-Expectorated)     Status: None   Collection Time: 01/22/15 12:20 PM  Result Value Ref Range Status   Specimen Description TRACHEAL ASPIRATE  Final    Special Requests NONE  Final   Gram Stain   Final    FEW WBC PRESENT,BOTH PMN AND MONONUCLEAR NO SQUAMOUS EPITHELIAL CELLS SEEN NO ORGANISMS SEEN Performed at [facility]    Culture   Final    Non-Pathogenic Oropharyngeal-type Flora Isolated. Performed at [facility]    Report Status 01/24/2015 FINAL  Final  Urine culture     Status: None   Collection Time: 01/28/15  6:26 PM  Result Value Ref Range Status   Specimen Description URINE, CLEAN CATCH  Final   Special Requests Normal  Final   Colony Count NO GROWTH Performed at [facility]   Final   Culture NO GROWTH Performed at [facility]   Final   Report Status 01/29/2015 FINAL  Final     Studies: No results found.  Scheduled Meds: . carvedilol  3.125 mg Oral BID WC  . enoxaparin (LOVENOX) injection  40 mg Subcutaneous Q24H  . feeding supplement (ENSURE ENLIVE)  237 mL Oral TID BM  . furosemide  40 mg Oral Daily  . insulin aspart  0-20 Units Subcutaneous TID WC  . insulin glargine  12 Units Subcutaneous QHS  . latanoprost  1 drop Both Eyes QHS  . metoCLOPramide (REGLAN) injection  5 mg Intravenous 3 times per day  . pantoprazole  40 mg Oral BID  . ranolazine  500 mg Oral BID  . saccharomyces boulardii  250 mg Oral BID   Continuous Infusions:   Time Spent: 25 min   Marinda Elk  [facility] Pager [phone]. If 7PM-7AM, please contact night-coverage at [email or web address], password [facility] 01/31/2015, 9:48 AM  LOS: 18 days

## 2015-01-31 NOTE — Progress Notes (Signed)
CARDIAC REHAB PHASE I   PRE:  Rate/Rhythm: 86 SR with PACs    BP: sitting 112/52    SaO2: 99 RA  MODE:  Ambulation: 140 ft   POST:  Rate/Rhythm: 86     BP: sitting 119/52     SaO2: 96 RA  Stronger today, longer steps. Did not need to sit and rest. Used rollator, gait belt, assist x2. Needed assist to stand and steady himself. Still weak, will keep x2. Return to recliner.   [id number]  Elissa Lovett Walden CES, ACSM 01/31/2015 10:59 AM

## 2015-02-01 LAB — GLUCOSE, CAPILLARY
Glucose-Capillary: 247 mg/dL — ABNORMAL HIGH (ref 65–99)
Glucose-Capillary: 224 mg/dL — ABNORMAL HIGH (ref 65–99)
Glucose-Capillary: 160 mg/dL — ABNORMAL HIGH (ref 65–99)
Glucose-Capillary: 143 mg/dL — ABNORMAL HIGH (ref 65–99)

## 2015-02-01 LAB — CLOSTRIDIUM DIFFICILE BY PCR: CDIFFPCR: NEGATIVE

## 2015-02-01 MED ORDER — INSULIN GLARGINE 100 UNIT/ML ~~LOC~~ SOLN
10.0000 [IU] | Freq: Two times a day (BID) | SUBCUTANEOUS | Status: DC
  Administered 2015-02-01 – 2015-02-13 (×25): 10 [IU] via SUBCUTANEOUS
  Filled 2015-02-01 (×29): qty 0.1

## 2015-02-01 NOTE — Progress Notes (Signed)
[facility] PROGRESS NOTE Interim History: 74 year old male who presented to [facility] hospital for syncopal episode and bradycardia which was later attribute to vasovagal response. Would have persisting epigastric pain with nausea. In the ED at [facility] a CT scan of the abdomen and pelvis show very stone lipase was greater than 300 LFTs were elevated lactic acid of 2.2 with a creatinine 1.7 she was transferred to  [facility] hospital. 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 CT scan of the abdomen and pelvis showed edema around the pancreas compatible with pancreatitis no abscess choledocholithiasis with stone in common bile duct and pleural effusion. She didn't had hemorrhagic shock and she had to be several transfused unit of packed red blood cells. Transferred to the ICU for acute respiratory distress intubated and PCCM took over with a chest x-ray which revealed bilateral consolidation. An EGD was performed that showed a grade 4 esophagitis with deep ulcers and friable mucosa. Anticoagulants were held along with aspirin. She is only on Lovenox for DVT prophylaxis. Chest x-ray on 518 showed improving deviations she was extubated. Lower extremity Doppler done on 01/24/2015 showed a bilateral lower extremity DVTs 1 IVC filter was placed.   Assessment/Plan: Hemorrhagic shock/acute blood loss anemia due to erosive esophagitis: - Due to esophagitis plus anti-coagulation, now resolved status post 5 units of packed red blood cells. - His hemoglobin appears to be stable, keep hemoglobin greater than 8. Continue trend CBCs. - GI signed off on 01/18/2015, continue PPI twice a day there is no evidence of ongoing bleeding at this time.  Acute gallstone pancreatitis: MRCP suggested the possibility of a 4 mm stone within the common bile duct. GI was consulted which recommended  laparoscopic cholecystectomy with intraoperative cholangiogram. Surgery rec cholecystectomy as an outpatient.  NSTEMI with severe triple-vessel disease: Cardiac cath revealed severe triple-vessel disease, Cardiology and TCTS suggest the patient not leave the hospital though patient will need to fully recover from his severe acute pancreatitis prior to surgery. - tolerating diet. - CT surgery to recommended further intervention.  Acute bilateral lower extremity DVT: She is currently on only prophylactic Lovenox due to hemorrhagic shock. IVC filter was placed. Continue to monitor CBC.  Ileus: - resolved with conservative management.  Acute kidney injury: - Renal ultrasound was unrevealing creatinine has improved significantly since admission. - cont to monitor.  Hypocalcemia: Corrected calcium is actually 9. No further interventions.  Persisting hypokalemia: Corrected with supplementation.  Hypophosphatemia: Has normalized with supplementation.  Syncopal episode most likely vasovagal: No further episodes.  Bradycardia: Initially felt to be vagal mediated.  New diagnosis of acute systolic heart failure: Held ACE inhibitor due to acute kidney injury, he is currently on a beta blocker.  Thrombocytopenia  HIT panel negative - suspect due to acute inflam illness - no evidence of acute blood loss - platelet count has normalized  Hypotension > hypertension Hypotension resolved with volume expansion - blood pressure controlled  DM Oral intake improving, BG trending up. - will increase lantus.   Code Status: FULL Family Communication: No family present at time of exam today Disposition Plan: Insurance will not cover CIR - transfer to cardiac telemetry bed while awaiting CABG   Consultants:  [facility]  CVTS  Cardiology  Procedures: Significant Events: 5/08 Transfer from [place] after syncopal episode, gallstones, pancreatitis, elevated troponin I, abnormal  echocardiogram 5/09 TTE: EF 30-40%, akinesis of basal-midinferior myocardium - severe  hypokinesis lateral myocardium - grade 1 DD 5/12 LHC: severe 3 vessel disease  5/13 CT ABD/Pelvis: infiltration edema around pancreas, c/w acute pancreatitis, no abscess, cholelithiasis with stones in CBD, bilateral pleural effusions with compressive atx. 5/15 transfused one unit PRBCs 5/16 Transferred to ICU with acute respiratory distress. Intubated. PCCM consultation. CXR revealed BLL consolidation 5/16 transfused one unit PRBCs 5/16 EGD: grade 4 esophagitis with deep ulcers, eschar, friability, blood clllots in stomach, no gastric ulcers or mucosal abnormalities, mod edema of 2nd portion of duodenum c/w pancreatitis. PPI infusion initiated. Heparin disconitnued. OGT left out 5/17 transfused one unit PRBCs for Hgb 7.4 in setting of ACS 5/18 CXR with improved basilar aeration. Extubated 5/19 LE venous duplex scans + B LE DVT 5/19 IVC filter placed    Antibiotics: Zosyn 5/12 > 5/13 Vancomycin 5/16 > 5/19 Primaxin 5/16 > 5/25  HPI/Subjective: Tolerating diet  Objective: Filed Vitals:   01/31/15 0553 01/31/15 1442 01/31/15 2140 02/01/15 0501  BP: 112/58 117/51 116/66 127/57  Pulse: 72 81 82 78  Temp: 97.6 F (36.4 C) 98 F (36.7 C) 98.1 F (36.7 C) 97.6 F (36.4 C)  TempSrc: Oral Oral Oral Oral  Resp: Height:      Weight:      SpO2: 99% 97% 99% 96%    Intake/Output Summary (Last 24 hours) at 02/01/15 0842 Last data filed at 01/31/15 1800  Gross per 24 hour  Intake    822 ml  Output    200 ml  Net    622 ml   Filed Weights   01/23/15 0600 01/24/15 0500 01/25/15 0400  Weight: 89.7 kg (197 lb 12 oz) 86.7 kg (191 lb 2.2 oz) 85.2 kg (187 lb 13.3 oz)    Exam:  General: Alert, awake, oriented x3, in no acute distress.  HEENT: No bruits, no goiter.  Heart: Regular rate and rhythm.lower ext edema. Lungs: Good air movement, clear Abdomen: Soft, nontender, nondistended,  positive bowel sounds.  Neuro: Grossly intact, nonfocal.   Data Reviewed: Basic Metabolic Panel:  Recent Labs Lab 01/26/15 0410 01/27/15 0400 01/28/15 0320 01/29/15 0052 01/29/15 0103 01/31/15 0429  NA 148* 147* 145 144  --  142  K 3.6 3.6 3.8 4.1  --  3.8  CL 116* 117* 116* 116*  --  112*  CO2 20*  --  21*  GLUCOSE 239* 146* 127* 218*  --  170*  BUN 31* 26* 27* 27*  --  27*  CREATININE 1.85* 1.80* 1.79* 1.84*  --  1.69*  CALCIUM 6.9* 6.7* 6.7* 6.8*  --  7.2*  MG  --  1.8 2.1  --   --   --   PHOS  --   --   --   --  4.1  --    Liver Function Tests:  Recent Labs Lab 01/26/15 0410 01/27/15 0400 01/28/15 0320 01/29/15 0052 01/31/15 0429  AST 149* 139* 95* 63* 63*  ALT 104* 94* 73* 57 47  ALKPHOS 85 74 61 63 78  BILITOT 1.5* 1.4* 1.3* 1.1 1.0  PROT 4.9* 5.1* 4.6* 5.1* 5.4*  ALBUMIN 1.3* 1.3* 1.3* 1.4* 1.5*    Recent Labs Lab 01/27/15 0400  LIPASE 15*   No results for input(s): AMMONIA in the last 168 hours. CBC:  Recent Labs Lab 01/27/15 0400 01/28/15 0320 01/28/15 1825 01/29/15 0052 01/31/15 0429  WBC 13.9* 10.9* 12.7* 11.2* 10.2  NEUTROABS 11.8* 9.0*  --   --   --  HGB 8.9* 8.5* 9.3* 8.7* 9.4*  HCT 27.3* 26.5* 29.5* 27.6* 29.8*  MCV 90.7 91.1 91.6 92.0 92.0  PLT 175 181 216 201 197   Cardiac Enzymes: No results for input(s): CKTOTAL, CKMB, CKMBINDEX, TROPONINI in the last 168 hours. BNP (last 3 results) No results for input(s): BNP in the last 8760 hours.  ProBNP (last 3 results) No results for input(s): PROBNP in the last 8760 hours.  CBG:  Recent Labs Lab 01/31/15 0545 01/31/15 1142 01/31/15 1714 01/31/15 2136 02/01/15 0615  GLUCAP 142* 165* 161* 184* 247*    Recent Results (from the past 240 hour(s))  Culture, respiratory (NON-Expectorated)     Status: None   Collection Time: 01/22/15 12:20 PM  Result Value Ref Range Status   Specimen Description TRACHEAL ASPIRATE  Final   Special Requests NONE  Final   Gram Stain    Final    FEW WBC PRESENT,BOTH PMN AND MONONUCLEAR NO SQUAMOUS EPITHELIAL CELLS SEEN NO ORGANISMS SEEN Performed at [facility]    Culture   Final    Non-Pathogenic Oropharyngeal-type Flora Isolated. Performed at [facility]    Report Status 01/24/2015 FINAL  Final  Urine culture     Status: None   Collection Time: 01/28/15  6:26 PM  Result Value Ref Range Status   Specimen Description URINE, CLEAN CATCH  Final   Special Requests Normal  Final   Colony Count NO GROWTH Performed at [facility]   Final   Culture NO GROWTH Performed at [facility]   Final   Report Status 01/29/2015 FINAL  Final     Studies: No results found.  Scheduled Meds: . carvedilol  3.125 mg Oral BID WC  . enoxaparin (LOVENOX) injection  40 mg Subcutaneous Q24H  . feeding supplement (ENSURE ENLIVE)  237 mL Oral TID BM  . furosemide  40 mg Oral Daily  . insulin aspart  0-20 Units Subcutaneous TID WC  . insulin glargine  12 Units Subcutaneous QHS  . latanoprost  1 drop Both Eyes QHS  . metoCLOPramide (REGLAN) injection  5 mg Intravenous 3 times per day  . pantoprazole sodium  40 mg Oral BID  . ranolazine  500 mg Oral BID  . saccharomyces boulardii  250 mg Oral BID   Continuous Infusions:   Time Spent: 25 min   Marinda Elk  [facility] Pager [phone]. If 7PM-7AM, please contact night-coverage at [email or web address], password [facility] 02/01/2015, 8:42 AM  LOS: 19 days

## 2015-02-01 NOTE — Progress Notes (Signed)
CARDIAC REHAB PHASE I   PRE:  Rate/Rhythm: 80 SR c/ PACs  BP:  Sitting: 126/58        SaO2: 98 RA  MODE:  Ambulation: 200 ft   POST:  Rate/Rhythm: 92 SR  BP:  Sitting: 133/64         SaO2: 100 RA  Pt asking to walk, daughter at bedside. Pt ambulated 200 ft on RA, assist x 2, rollator, gait belt, steady gait, tolerated well. Pt stronger today during ambulation, still requiring assistance to stand, declined rest stop, increased distance. Pt smiling. Encouraged continued ambulation. To recliner after walk, call bell within reach. Encouraged IS. Pt and daughter state they are still unsure when surgery will be. Will continue to follow.  [phone]   Joylene Grapes, RN, BSN 02/01/2015 12:41 PM

## 2015-02-01 NOTE — Progress Notes (Signed)
Patient Profile: Complicated hospital course. Admitted for acute pancreatitis with acute calculous cholecystitis. Had significant troponin elevation leading to [facility] 5/12 which demonstrated severe multivessel disease (awaitig CABG). Has acute systolic HF with EF of 30-40%. Also developed hemorrhagic shock/ acute blood loss anemia, requiring transfusion. Also with acute on chronic renal failure and acute LE DVT s/p IVC filter. Failed swallow study 5/25. On dysphagia 3 diet. Medication administration is ordered for crushed with puree.   Subjective: No complaints. Denies CP and dyspnea. Looks well. Family by bedside.   Objective: Vital signs in last 24 hours: Temp:  [97.6 F (36.4 C)-98.1 F (36.7 C)] 97.6 F (36.4 C) (05/27 0501) Pulse Rate:  [78-82] 78 (05/27 0501) Resp:  [15-18] 18 (05/27 0501) BP: (116-127)/(51-66) 127/57 mmHg (05/27 0501) SpO2:  [96 %-99 %] 96 % (05/27 0501) Last BM Date: 01/31/15  Intake/Output from previous day: 05/26 0701 - 05/27 0700 In: 822 [P.O.:822] Out: 200 [Urine:200] Intake/Output this shift: Total I/O In: 240 [P.O.:240] Out: 300 [Urine:300]  Medications Current Facility-Administered Medications  Medication Dose Route Frequency Provider Last Rate Last Dose  . acetaminophen (TYLENOL) tablet 650 mg  650 mg Oral Q4H PRN Lonia Blood, MD      . bisacodyl (DULCOLAX) suppository 10 mg  10 mg Rectal Daily PRN Merwyn Katos, MD      . carvedilol (COREG) tablet 3.125 mg  3.125 mg Oral BID WC Ejiroghene E Emokpae, MD   3.125 mg at 02/01/15 1004  . enoxaparin (LOVENOX) injection 40 mg  40 mg Subcutaneous Q24H Ejiroghene E Emokpae, MD   40 mg at 01/31/15 1239  . feeding supplement (ENSURE ENLIVE) (ENSURE ENLIVE) liquid 237 mL  237 mL Oral TID BM Jenifer A Williams, RD   237 mL at 02/01/15 1004  . fentaNYL (SUBLIMAZE) injection 12.5-25 mcg  12.5-25 mcg Intravenous Q2H PRN Merwyn Katos, MD      . furosemide (LASIX) tablet 40 mg  40 mg Oral Daily Wilhemina Bonito, RPH   40 mg at 02/01/15 1004  . insulin aspart (novoLOG) injection 0-20 Units  0-20 Units Subcutaneous TID WC Lonia Blood, MD   7 Units at 02/01/15 [phone]  . insulin glargine (LANTUS) injection 10 Units  10 Units Subcutaneous BID Marinda Elk, MD      . latanoprost (XALATAN) 0.005 % ophthalmic solution 1 drop  1 drop Both Eyes QHS Lonia Blood, MD   1 drop at 01/31/15 2120  . metoCLOPramide (REGLAN) injection 5 mg  5 mg Intravenous 3 times per day Merwyn Katos, MD   5 mg at 02/01/15 0354  . ondansetron (ZOFRAN) injection 4 mg  4 mg Intravenous Q6H PRN Lyn Records, MD   4 mg at 01/18/15 0359  . pantoprazole sodium (PROTONIX) 40 mg/20 mL oral suspension 40 mg  40 mg Oral BID Marinda Elk, MD   40 mg at 01/31/15 2120  . ranolazine (RANEXA) 12 hr tablet 500 mg  500 mg Oral BID Marykay Lex, MD   Stopped at 01/31/15 2122  . saccharomyces boulardii (FLORASTOR) capsule 250 mg  250 mg Oral BID Marinda Elk, MD   250 mg at 02/01/15 1004    PE: General appearance: alert, cooperative and no distress Neck: no carotid bruit and no JVD Lungs: clear to auscultation bilaterally Heart: regular rate and rhythm, S1, S2 normal, no murmur, click, rub or gallop Extremities: 2+ bilateral LEE Pulses: 2+ and symmetric Skin: warm and dry  Neurologic: Grossly normal  Lab Results:   Recent Labs  01/31/15 0429  WBC 10.2  HGB 9.4*  HCT 29.8*  PLT 197   BMET  Recent Labs  01/31/15 0429  NA 142  K 3.8  CL 112*  CO2 21*  GLUCOSE 170*  BUN 27*  CREATININE 1.69*  CALCIUM 7.2*     Assessment/Plan    Principal Problem:   Acute gallstone pancreatitis Active Problems:   Abdominal pain, epigastric   Nausea with vomiting   Elevated LFTs   Gallstones   Syncope   Hypotension   Bradycardia   AKI (acute kidney injury)   Diabetes   Pancreatitis   NSTEMI (non-ST elevated myocardial infarction)   Cardiomyopathy   Choledocholithiasis   Hypocalcemia    Ileus   Acute biliary pancreatitis   Leukocytosis   Syncope and collapse   Acute systolic congestive heart failure   Essential hypertension   Other specified hypotension   Acute kidney injury   Diabetes type 2, uncontrolled   CAD, multiple vessel: CTO of RCA, Cx with severe prox LAD disease   Calculus of gallbladder with acute cholecystitis without obstruction   Cholecystitis, acute   Metabolic acidosis   Acute systolic CHF (congestive heart failure)   Acute renal failure syndrome   Acute blood loss anemia   Hemorrhagic shock   Acute respiratory failure with hypoxemia   Septic shock   Ulcerative esophagitis   Hematemesis with nausea   Melena   Demand ischemia of myocardium   Erosive esophagitis   HCAP (healthcare-associated pneumonia)   Shock   DVT (deep venous thrombosis)   Cardiomyopathy, ischemic   Ischemic cardiomyopathy   1. Hemorrhagic Shock/Acute Blood Loss Anemia- resolved, S/p 5 units of PRBC. Hgb appears stable- at 9.4. No more bloody bowel movements. Restarted Lovenox-  daily.  - Per primary  2. CAD- Multi-vessel dx on Cath- 01/17/2015, planned for CABG when acute issues resolve.Severe native vessel coronary disease with total occlusion of the RCA, total occlusion of the proximal circumflex, and severe proximal LAD disease. Currently CP free.  - CVTS Recs appreciated, awaiting resolution of acute medical problems, and improvement in physical status - Not on asa 81, can resume soon, on Lovenox now for DVT ppx - Cont carvedilol, uptitrate Coreg as tolerated  - Discussed with pharmacist, patient failed swallow study. All  PO meds need to be administered crushed. Ranexa cannot be crushed. Patient has not been receiving. Will discontinue for now.    3. Acute systolic HF: EF 30 - 40%. Physical exam notable for bilateral LEE. Lungs are CTAB. No dyspnea.  - Now on low-dose carvedilol. - No ACE/ARB due to renal failure.  - Consideration to Start low-dose  hydral-nitrates, will hold for now.  - Continue PO Lasix. Monitor renal function. Strict I/Os, daily weights and low sodium diet.    4. Acute LE DVTs- Off systemic heparin due to acute severe GI bleed, required 5units of blood. Back on low-dose enoxaparin to prevent extension of DVT. Consider starting Anticoag when stable. - S/p IVC filter placement. - ON Lovenox-  daily   5. Acute Pancreatitis with Acute calculous cholecystitis- Improving.  - On primaxin - Per [facility]  6. AKI on CKD- Improving, Cr down from 2.28 --> 1.69 01/31/2015. Cr on admission- 1.5, likely baseline, no prior to compare.    7. Erosive Esophagitis- Appears stable.  - management per primary  8. Diabetes- On SSI  - Glucerna Shakes.    LOS: 19 days  Brittainy M. Delmer Islam 02/01/2015 11:32 AM  Patient seen and examined  Agree with findings as noted above b BSimmons. Patient recovering slowly  Continue current medical Rx  Dietrich Pates

## 2015-02-01 NOTE — Care Management (Signed)
Medicare Important Message given? Yes (If Response is "NO", the following Medicare IM given date fields will be blank) Date Medicare IM given:  02/01/15 Medicare IM given by: Khriz Liddy 

## 2015-02-02 LAB — BASIC METABOLIC PANEL
Anion gap: 11 (ref 5–15)
BUN: 23 mg/dL — ABNORMAL HIGH (ref 6–20)
CO2: 22 mmol/L (ref 22–32)
Calcium: 7.1 mg/dL — ABNORMAL LOW (ref 8.9–10.3)
Chloride: 110 mmol/L (ref 101–111)
Creatinine, Ser: 1.53 mg/dL — ABNORMAL HIGH (ref 0.61–1.24)
GFR calc Af Amer: 51 mL/min — ABNORMAL LOW (ref >60)
GFR calc non Af Amer: 44 mL/min — ABNORMAL LOW (ref >60)
GLUCOSE: 152 mg/dL — AB (ref 65–99)
Potassium: 3.7 mmol/L (ref 3.5–5.1)
Sodium: 143 mmol/L (ref 135–145)

## 2015-02-02 LAB — GLUCOSE, CAPILLARY
GLUCOSE-CAPILLARY: 167 mg/dL — AB (ref 65–99)
GLUCOSE-CAPILLARY: 168 mg/dL — AB (ref 65–99)
Glucose-Capillary: 175 mg/dL — ABNORMAL HIGH (ref 65–99)
Glucose-Capillary: 165 mg/dL — ABNORMAL HIGH (ref 65–99)

## 2015-02-02 LAB — CBC
HCT: 26.5 % — ABNORMAL LOW (ref 39.0–52.0)
HEMOGLOBIN: 8.6 g/dL — AB (ref 13.0–17.0)
MCH: 29 pg (ref 26.0–34.0)
MCHC: 32.5 g/dL (ref 30.0–36.0)
MCV: 89.2 fL (ref 78.0–100.0)
Platelets: 177 10*3/uL (ref 150–400)
RBC: 2.97 MIL/uL — ABNORMAL LOW (ref 4.22–5.81)
RDW: 13.8 % (ref 11.5–15.5)
WBC: 8.2 10*3/uL (ref 4.0–10.5)

## 2015-02-02 MED ORDER — CARVEDILOL 6.25 MG PO TABS
6.2500 mg | ORAL_TABLET | Freq: Two times a day (BID) | ORAL | Status: DC
  Administered 2015-02-02 – 2015-02-13 (×23): 6.25 mg via ORAL
  Filled 2015-02-02 (×26): qty 1

## 2015-02-02 MED ORDER — METOPROLOL TARTRATE 12.5 MG HALF TABLET
12.5000 mg | ORAL_TABLET | Freq: Two times a day (BID) | ORAL | Status: DC
  Filled 2015-02-02 (×2): qty 1

## 2015-02-02 NOTE — Progress Notes (Signed)
SUBJECTIVE:  Patient is stable but appears weak. His daughter is in the room. We had a full discussion about plans. He is not strong enough to be able to be managed by his wife. They live 45 minutes away.   Filed Vitals:   01/31/15 2140 02/01/15 0501 02/01/15 1530 02/02/15 0420  BP: 116/66 127/57 125/53 111/52  Pulse: 82 78 79 79  Temp: 98.1 F (36.7 C) 97.6 F (36.4 C) 97.9 F (36.6 C) 98.1 F (36.7 C)  TempSrc: Oral Oral Oral Oral  Resp: Height:      Weight:      SpO2: 99% 96% 98% 97%     Intake/Output Summary (Last 24 hours) at 02/02/15 0935 Last data filed at 02/02/15 0815  Gross per 24 hour  Intake    480 ml  Output    400 ml  Net     80 ml    LABS: Basic Metabolic Panel:  Recent Labs  04/54/09 0429 02/02/15 0002  NA 142 143  K 3.8 3.7  CL 112* 110  CO2 21* 22  GLUCOSE 170* 152*  BUN 27* 23*  CREATININE 1.69* 1.53*  CALCIUM 7.2* 7.1*   Liver Function Tests:  Recent Labs  01/31/15 0429  AST 63*  ALT 47  ALKPHOS 78  BILITOT 1.0  PROT 5.4*  ALBUMIN 1.5*   No results for input(s): LIPASE, AMYLASE in the last 72 hours. CBC:  Recent Labs  01/31/15 0429 02/02/15 0002  WBC 10.2 8.2  HGB 9.4* 8.6*  HCT 29.8* 26.5*  MCV 92.0 89.2  PLT 197 177   Cardiac Enzymes: No results for input(s): CKTOTAL, CKMB, CKMBINDEX, TROPONINI in the last 72 hours. BNP: Invalid input(s): POCBNP D-Dimer: No results for input(s): DDIMER in the last 72 hours. Hemoglobin A1C: No results for input(s): HGBA1C in the last 72 hours. Fasting Lipid Panel: No results for input(s): CHOL, HDL, LDLCALC, TRIG, CHOLHDL, LDLDIRECT in the last 72 hours. Thyroid Function Tests: No results for input(s): TSH, T4TOTAL, T3FREE, THYROIDAB in the last 72 hours.  Invalid input(s): FREET3  RADIOLOGY: Ct Abdomen Pelvis Wo Contrast  01/18/2015   CLINICAL DATA:  Nausea and vomiting after cardiac catheterization yesterday.  EXAM: CT ABDOMEN AND PELVIS WITHOUT CONTRAST   TECHNIQUE: Multidetector CT imaging of the abdomen and pelvis was performed following the standard protocol without IV contrast.  COMPARISON:  Abdomen 01/17/2015  FINDINGS: Bilateral pleural effusions with basilar atelectasis. Coronary artery calcifications. Residual contrast material in the esophagus may represent reflux or dysmotility.  Cholelithiasis with multiple small stones in the gallbladder. No gallbladder wall thickening. Stones are present in the common bile duct. No significant bile duct dilatation. There is diffuse inflammatory infiltration and edema in the fat around the pancreas consistent with acute pancreatitis. Calcifications in the head of the pancreas may represent stones or postinflammatory calcifications. No loculated fluid collection is demonstrated to suggest abscess.  The unenhanced appearance of the liver, spleen, adrenal glands, inferior vena cava, and retroperitoneal lymph nodes is unremarkable. Calcification of the aorta without aneurysm. Bilateral low-attenuation renal lesions likely representing cyst but too small to characterize. No hydronephrosis in either stomach, small bowel, and colon are not abnormally distended. There is suggestion of wall thickening in some of the small bowel loops possibly indicating enteritis. Kidney. Small amount of free fluid in the abdomen and pelvis. No free air.  Pelvis: Appendix is normal. Bladder wall is not thickened. Prostate gland is not enlarged. No pelvic mass  or lymphadenopathy. Degenerative changes in the spine. No destructive bone lesions. Diffuse subcutaneous soft tissue edema.  IMPRESSION: Infiltration edema around the pancreas with fluid in the pericolic gutters and pelvis. Changes are consistent with acute pancreatitis. No abscess identified. Cholelithiasis with stones appearing in the common bile duct. No bile duct dilatation. Bilateral pleural effusions with basilar atelectasis.   Electronically Signed   By: Burman Nieves M.D.   On:  01/18/2015 03:22   Dg Abd 1 View  01/15/2015   CLINICAL DATA:  Ileus  EXAM: ABDOMEN - 1 VIEW  COMPARISON:  None.  FINDINGS: Scattered large and small bowel gas is noted. Mildly dilated loops of small bowel are to in the mid abdomen. No free air is noted. No definitive obstructive changes are seen.  IMPRESSION: Mild dilatation of small bowel likely representing a small-bowel ileus. Correlation with the physical exam is recommended.   Electronically Signed   By: Alcide Clever M.D.   On: 01/15/2015 09:18   Ir Ivc Filter Plmt / S&i /img Guid/mod Sed  01/24/2015   CLINICAL DATA:  Acute DVT. GI bleed, a relative contraindication to anticoagulation. Caval filtration is requested. Renal insufficiency. Indwelling right IJ triple-lumen catheter.  EXAM: INFERIOR VENACAVOGRAM  IVC FILTER PLACEMENT UNDER FLUOROSCOPY  FLUOROSCOPY TIME:  30 seconds, 35.6 mGy  TECHNIQUE: Patency of the left IJ vein was confirmed with ultrasound with image documentation. An appropriate skin site was determined. Skin site was marked, prepped with chlorhexidine, and draped using maximum barrier technique. The region was infiltrated locally with 1% lidocaine.  Intravenous Fentanyl and Versed were administered as conscious sedation during continuous cardiorespiratory monitoring by the radiology RN, with a total moderate sedation time of less than 30 minutes.  Under real-time ultrasound guidance, the left IJ vein was accessed with a 21 gauge micropuncture needle; the needle tip within the vein was confirmed with ultrasound image documentation. The needle was exchanged over a 018 guidewire for a transitional dilator, which allow advancement of the [facility] wire into the IVC. A long 6 French vascular sheath was placed for inferior venacavography using CO2. This demonstrated no caval thrombus. Renal vein inflows were evident.  The [facility] IVC filter was advanced through the sheath and successfully deployed under fluoroscopy at the L2 level. Followup CO2  cavagram demonstrates stable filter position and no evident complication. The sheath was removed and hemostasis achieved at the site. No immediate complication.  IMPRESSION: 1. Normal IVC. No thrombus or significant anatomic variation. 2. Technically successful infrarenal IVC filter placement. This is a retrievable model.   Electronically Signed   By: Corlis Leak M.D.   On: 01/24/2015 17:44   US Renal  01/16/2015   CLINICAL DATA:  Acute renal failure  EXAM: RENAL / URINARY TRACT ULTRASOUND COMPLETE  COMPARISON:  None.  FINDINGS: Right Kidney:  Length: 11.4 cm. Echogenicity within normal limits. No mass or hydronephrosis visualized.  Left Kidney:  Length: 11.9 cm. Echogenicity within normal limits. No mass or hydronephrosis visualized.  Bladder:  Appears normal for degree of bladder distention.  Small amount of ascites.  IMPRESSION: Normal renal ultrasound.   Electronically Signed   By: Amie Portland M.D.   On: 01/16/2015 15:58   Mr Abdomen Mrcp Wo Cm  01/16/2015   CLINICAL DATA:  Cholelithiasis with abdominal pain.  EXAM: MRI ABDOMEN WITHOUT CONTRAST  (INCLUDING MRCP)  TECHNIQUE: Multiplanar multisequence MR imaging of the abdomen was performed. Heavily T2-weighted images of the biliary and pancreatic ducts were obtained, and three-dimensional MRCP images  were rendered by post processing.  COMPARISON:  None.  FINDINGS: Exam is limited due to patient motion. Patient had difficult tolerating the length of the exam as well as difficulty breath holding.  Lower chest:  There is dense bibasilar atelectasis.  Hepatobiliary: There is no intrahepatic biliary duct dilatation. No discrete hepatic lesion on this noncontrast exam. There is loss of signal intensity on the opposed phase imaging consistent with hepatic steatosis.  There are several small gallstones layering dependently within the lumen of the gallbladder. These measure approximately 6. There is no gallbladder inflammation evident. Several stones appear to be  with in the neck of the gallbladder (image 16, series 3).  The common bile duct and common hepatic duct are not dilated. There is a filling defect within the common bile duct on image 17, series 3. This is small measuring approximately 4 mm. This appears to be replicated on the heavily T2 weighted series 20 on image 3.  Pancreas: There is extensive inflammation involving the head body and tail of the pancreas suggesting acute pancreatitis. Pancreatic duct is difficult to visualize on the sequences due to respiratory motion but does not appear dilated. There is fluid along the anterior perirenal fascia left and right. Small amount fluid around the liver and spleen. No organized fluid collections are evident.  Spleen: Normal spleen.  Adrenals/urinary tract: There several renal lesions have high signal intensity T2 weighted imaging suggesting renal cysts. No hydronephrosis.  Stomach/Bowel: Stomach and limited of the small bowel is unremarkable  Vascular/Lymphatic: Abdominal aortic normal caliber. No retroperitoneal periportal lymphadenopathy.  Musculoskeletal: No aggressive osseous lesion  IMPRESSION: 1. Acute pancreatitis. No organized fluid collections. Enhancement pattern of pancreas cannot be determined. 2. Cholelithiasis without evidence of acute cholecystitis. 3. Potential small 4 mm filling defect within the mid common bile duct. Diagnostic confidence is limited due to patient motion and breathing. No common bile duct dilatation or biliary duct dilatation. 4. Hepatic steatosis. 5. Dense bibasilar atelectasis .   Electronically Signed   By: Genevive Bi M.D.   On: 01/16/2015 08:25   Dg Chest Port 1 View  01/25/2015   CLINICAL DATA:  Respiratory failure.  EXAM: PORTABLE CHEST - 1 VIEW  COMPARISON:  01/24/2015 .  FINDINGS: Right IJ line stable position. Stable cardiomegaly. Diffuse pulmonary alveolar infiltrates in pleural effusions again noted. No interim change . No pneumothorax. No acute bony abnormality  P  IMPRESSION: 1. Right IJ line stable position. 2. Persistent cardiomegaly with bilateral pulmonary alveolar infiltrates and pleural effusions consistent with congestive heart failure. No interim change. Superimposed pneumonia cannot be excluded.   Electronically Signed   By: Maisie Fus  Register   On: 01/25/2015 07:22   Dg Chest Port 1 View  01/24/2015   CLINICAL DATA:  Respiratory failure.  EXAM: PORTABLE CHEST - 1 VIEW  COMPARISON:  01/23/2015 .  FINDINGS: Interim extubation. Right IJ line in stable position. Mediastinum and hilar structures stable. Cardiomegaly. Bilateral pulmonary alveolar infiltrates and pleural effusions. These findings are consistent with congestive heart failure. Bilateral pneumonia cannot be excluded. No pneumothorax.  IMPRESSION: 1. Interim removal of endotracheal tube. Right IJ line in stable position. 2. Persistent cardiomegaly with progressed bilateral prominent pulmonary alveolar infiltrates and pleural effusions consistent with congestive heart failure. Bilateral pneumonia cannot be excluded.   Electronically Signed   By: Maisie Fus  Register   On: 01/24/2015 07:31   Dg Chest Port 1 View  01/23/2015   CLINICAL DATA:  Respiratory failure.  EXAM: PORTABLE CHEST - 1 VIEW  COMPARISON:  01/22/2015.  FINDINGS: Endotracheal tube and IJ line in stable position. Mediastinum and hilar structures are normal. Stable cardiomegaly and pulmonary vascularity. Persistent bibasilar pulmonary infiltrates and pleural effusions. These findings could be related congestive heart failure and/or bilateral pneumonia. No pneumothorax.  IMPRESSION: 1. Endotracheal tube and right IJ line in stable position. 2. Persistent bilateral basilar pulmonary alveolar infiltrates with small pleural effusions. These changes could be related to congestive heart failure and/or bilateral pneumonia. No significant interim change.   Electronically Signed   By: Maisie Fus  Register   On: 01/23/2015 07:10   Dg Chest Port 1  View  01/22/2015   CLINICAL DATA:  Central line placement  EXAM: PORTABLE CHEST - 1 VIEW  COMPARISON:  Portable exam 2037 hours compared to 01/22/2015  FINDINGS: Tip of endotracheal tube projects approximately 4.1 cm above carina.  Interval removal of LEFT jugular line.  New RIGHT jugular central venous catheter with tip projecting over SVC.  Costophrenic angles excluded, repeat image declined by ordering physician.  Bibasilar effusions and atelectasis.  Upper lungs clear.  No pneumothorax.  IMPRESSION: No pneumothorax following RIGHT jugular line placement.  Persistent bibasilar effusions and atelectasis.   Electronically Signed   By: Ulyses Southward M.D.   On: 01/22/2015 21:13   Dg Chest Port 1 View  01/22/2015   CLINICAL DATA:  Shortness of breath.  Intubation.  EXAM: PORTABLE CHEST - 1 VIEW  COMPARISON:  01/21/2015.  FINDINGS: Endotracheal tube 2.1 cm above the carina. Left IJ line in stable position. Interim removal of NG tube. Stable cardiomegaly with bilateral pulmonary alveolar infiltrates and pleural effusions consistent with congestive heart failure. No pneumothorax.  IMPRESSION: 1. Endotracheal tube 2.1 cm above the carina. Left IJ line in stable position Interim removal of NG tube. 2. Persistent changes of congestive heart failure with bilateral pulmonary edema and pleural effusions.   Electronically Signed   By: Maisie Fus  Register   On: 01/22/2015 07:21   Dg Chest Port 1 View  01/21/2015   CLINICAL DATA:  ET tube placement.  EXAM: PORTABLE CHEST - 1 VIEW  COMPARISON:  01/21/2015  FINDINGS: Endotracheal tube is slightly above the carina, approximately 15 mm, directed toward the right mainstem bronchus. Left central line tip is in the SVC. No pneumothorax. Bilateral lower lobe airspace opacities. Heart is normal size. Possible small layering effusions. NG tube enters the stomach. No acute bony abnormality.  IMPRESSION: Endotracheal tube approximately 15 mm above the carina, directed toward the right  mainstem bronchus. This could be retracted 1-2 cm for optimal positioning.  Bibasilar atelectasis or infiltrates. Question small layering effusions.   Electronically Signed   By: Charlett Nose M.D.   On: 01/21/2015 11:57   Dg Chest Port 1 View  01/21/2015   CLINICAL DATA:  Lethargy, shortness of breath, epigastric pain  EXAM: PORTABLE CHEST - 1 VIEW  COMPARISON:  01/19/2015  FINDINGS: Cardiomediastinal silhouette is stable. Persistent bilateral small pleural effusion with bilateral basilar atelectasis or infiltrate. No convincing pulmonary edema.  IMPRESSION: Persistent bilateral small pleural effusion with bilateral basilar atelectasis or infiltrate. No pulmonary edema.   Electronically Signed   By: Natasha Mead M.D.   On: 01/21/2015 08:57   Dg Chest Port 1 View  01/19/2015   CLINICAL DATA:  Chest congestionPr does have a NG tube placed currently Hx of HTN, DM, cholelithiasis  EXAM: PORTABLE CHEST - 1 VIEW  COMPARISON:  01/18/2015.  FINDINGS: Bibasilar lung opacity is stable, obscuring both hemidiaphragms. This is consistent with  combination of moderate pleural effusions with lung base atelectasis, pneumonia or a combination. There is no pulmonary edema. Remainder of the lungs is clear.  No pneumothorax.  Cardiac silhouette is normal in size. No mediastinal or hilar masses.  Nasogastric tube passes below the diaphragm, stable.  IMPRESSION: 1. No significant change from the previous day's study. 2. Persistent bilateral lung base opacity consistent with a combination of moderate pleural effusions with atelectasis, pneumonia or a combination. No pulmonary edema. No pneumothorax.   Electronically Signed   By: Amie Portland M.D.   On: 01/19/2015 09:06   Dg Chest Port 1 View  01/18/2015   CLINICAL DATA:  Shortness of breath.  EXAM: PORTABLE CHEST - 1 VIEW  COMPARISON:  None.  FINDINGS: NG tube noted with tip below left hemidiaphragm. Mediastinum and hilar structures are normal. Heart size normal. Bilateral  pulmonary infiltrates and pleural effusions noted. No pneumothorax. No acute bony abnormality .  IMPRESSION: 1. NG tube noted with tip below left hemidiaphragm. 2. Bilateral prominent basilar pulmonary infiltrates. These changes could be related to pulmonary edema and/or pneumonia. Small bilateral pleural effusions.   Electronically Signed   By: Maisie Fus  Register   On: 01/18/2015 07:25   Dg Abd Portable 1v  01/23/2015   CLINICAL DATA:  Followup adynamic ileus.  EXAM: PORTABLE ABDOMEN - 1 VIEW  COMPARISON:  CT, 01/18/2015  FINDINGS: Normal bowel gas pattern. No evidence of bowel obstruction or significant adynamic ileus.  A round density projects in the right mid abdomen. This could reflect a gallstone. There other densities are upper quadrant consistent with the known gallstones.  Mild vascular calcifications.  Soft tissues otherwise unremarkable.  IMPRESSION: 1. No evidence of bowel obstruction. No evidence of a significant residual adynamic ileus. No acute finding.   Electronically Signed   By: Amie Portland M.D.   On: 01/23/2015 12:48   Dg Abd Portable 1v  01/17/2015   CLINICAL DATA:  Abdominal pain for 3 days, vomiting for 1 day  EXAM: PORTABLE ABDOMEN - 1 VIEW  COMPARISON:  01/15/2015  FINDINGS: The bowel gas pattern is normal. No radio-opaque calculi or other significant radiographic abnormality are seen. Some colonic gas is noted in right colon and mid transverse colon without significant distension  IMPRESSION: Negative.   Electronically Signed   By: Natasha Mead M.D.   On: 01/17/2015 19:51   Dg Swallowing Func-speech Pathology  01/24/2015    Objective Swallowing Evaluation:   MBS Patient Details  Name: [name] [name] MRN: [id number] Date of Birth: [birth year]  Today's Date: 01/24/2015 Time: SLP Start Time (ACUTE ONLY): 1315-SLP Stop Time (ACUTE ONLY): 1340 SLP Time Calculation (min) (ACUTE ONLY): 25 min  Past Medical History:  Past Medical History  Diagnosis Date  . Hypertension   . Diabetes mellitus type  2 in obese   . Cholelithiasis    Past Surgical History:  Past Surgical History  Procedure Laterality Date  . Cardiac catheterization N/A 01/17/2015    Procedure: Left Heart Cath and Coronary Angiography;  Surgeon: Lyn Records, MD;  Location: [facility] INVASIVE CV LAB;  Service: Cardiovascular;   Laterality: N/A;  . Esophagogastroduodenoscopy N/A 01/21/2015    Procedure: ESOPHAGOGASTRODUODENOSCOPY (EGD);  Surgeon: Hilarie Fredrickson, MD;   Location: [facility] ENDOSCOPY;  Service: Endoscopy;  Laterality: N/A;  to be  done at BEDSIDE   HPI:  Other Pertinent Information: 74 y/o M admitted 5/8 after a syncopal  episode and found to have acute pancreatitis. Also positive  troponin/NSTEMI. EGD shows  grade 4 esophagitis with deep ulcers, eschar,  friability, blood clots in stomach. Intubated from 5/16 to 5/18. Pt  reprotedly having difficulty swallowing dinner on 5/18.   No Data Recorded  Assessment / Plan / Recommendation CHL IP CLINICAL IMPRESSIONS 01/24/2015  Therapy Diagnosis Moderate oral phase dysphagia;Moderate pharyngeal phase  dysphagia;Moderate cervical esophageal phase dysphagia  Clinical Impression MBS complete. Overt s/s of aspiration not seen during  exam this afternoon as noted on bedside eval this am.  Patient does  present with what appears to be a multifactorial dysphagia with laryngeal  and pharyngeal weakness and possible esophageal component, resulting  decreased pharyngeal squeeze, decreased relaxation of the UES, and  moderate pharyngeal residuals post swallow, greater with solids vs  liquids. Despite above however, patient fully protecting airway during  study. Should be notes that patient consuming solids and liquids via very  small boluses (less than 1/2 tsp) despite max clinician encouragement for  larger size; ? cognitive status contributing as patient also presents with  decreased sustained attention to task. Aspiration risk remains moderately  high given degree of residuals without patient awareness or ability to   clear within a reasonable time frame. Will initiate a po diet and f/u  closely for tolerance and potential to advance.       CHL IP TREATMENT RECOMMENDATION 01/24/2015  Treatment Recommendations Therapy as outlined in treatment plan below     CHL IP DIET RECOMMENDATION 01/24/2015  SLP Diet Recommendations Dysphagia 1 (Puree);Thin  Liquid Administration via (None)  Medication Administration Crushed with puree  Compensations Small sips/bites;Slow rate;Multiple dry swallows after each  bite/sip;Follow solids with liquid  Postural Changes and/or Swallow Maneuvers (None)     CHL IP OTHER RECOMMENDATIONS 01/24/2015  Recommended Consults (None)  Oral Care Recommendations Oral care BID  Other Recommendations (None)     No flowsheet data found.   CHL IP FREQUENCY AND DURATION 01/24/2015  Speech Therapy Frequency (ACUTE ONLY) min 2x/week  Treatment Duration 1 week         SLP Swallow Goals No flowsheet data found.  No flowsheet data found.    CHL IP REASON FOR REFERRAL 01/24/2015  Reason for Referral Objectively evaluate swallowing function     Ferdinand Lango MA, CCC-SLP [phone]               Ferdinand Lango Meryl 01/24/2015, 1:52 PM     PHYSICAL EXAM  patient is oriented to person time and place. Affect is normal. The patient's daughters in the room. Lungs reveal scattered rhonchi. Cardiac exam reveals S1 and S2. He has 1-2+ peripheral edema in the lower extremities. (Albumen is low.)     ASSESSMENT AND PLAN:    AKI (acute kidney injury)     Fortunately this is improving.    Cardiomyopathy    Acute biliary pancreatitis     This continues to improve and is no longer an active clinical problem.     Acute systolic congestive heart failure    Plan now is for gentle diuresis. His albumen is low.    CAD, multiple vessel: CTO of RCA, Cx with severe prox LAD disease     Eventually he needs CABG. However he needs to recover overall first. We need to continue his hospitalization to optimize his overall status. When he is  completely stable, he can then go to skilled nursing facility to continue his overall recovery including nutrition recovery. He could then come back to the hospital for bypass surgery.  Deconditioning  The patient needs further very careful treatment. I believe he does require skilled nursing at this time.    Willa Rough 02/02/2015 9:35 AM

## 2015-02-02 NOTE — Progress Notes (Signed)
Frequent loose stools this afternoon, small volumes.  States "its every time i eat anything."  Noted negative Cdiff from yesterday so will not test again. Barrier cream applied to sacrum which is becoming red.  Will con't plan of care.

## 2015-02-02 NOTE — Progress Notes (Signed)
Pt ambulated 250 ft using rolator, with standby assist.  No complaints, no rests.  Slow steady gait.  Returned to recliner with feet elevated and family in room.  Ordered gel mat by request and Pt appreciative of increase comfort while sitting.  Will con't plan of care.

## 2015-02-02 NOTE — Progress Notes (Signed)
CARDIAC REHAB PHASE I   PRE:  Rate/Rhythm: 79 sinus rhythm with PAC  BP:  Supine:   Sitting: 113/56  Standing:    SaO2: 100% ra  MODE:  Ambulation: 200 ft   POST:  Rate/Rhythem: 77 sinus rhythm  BP:  Supine:   Sitting: 126/54  Standing:     SaO2: 99% ra  945-1020am  Pt ambulated in hallway x1 assist using rolling walker.  Slow steady gait. Pt took 2 standing rest breaks. Asymptomatic.  Pt returned to chair, call light in reach.  Family at bedside.    Keelee Yankey, [place]

## 2015-02-02 NOTE — Progress Notes (Signed)
[facility] PROGRESS NOTE Interim History: 74 year old male who presented to [facility] hospital for syncopal episode and bradycardia which was later attribute to vasovagal response. Would have persisting epigastric pain with nausea. In the ED at [facility] a CT scan of the abdomen and pelvis show very stone lipase was greater than 300 LFTs were elevated lactic acid of 2.2 with a creatinine 1.7 she was transferred to  [facility] hospital. 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 CT scan of the abdomen and pelvis showed edema around the pancreas compatible with pancreatitis no abscess choledocholithiasis with stone in common bile duct and pleural effusion. She didn't had hemorrhagic shock and she had to be several transfused unit of packed red blood cells. Transferred to the ICU for acute respiratory distress intubated and PCCM took over with a chest x-ray which revealed bilateral consolidation. An EGD was performed that showed a grade 4 esophagitis with deep ulcers and friable mucosa. Anticoagulants were held along with aspirin. She is only on Lovenox for DVT prophylaxis. Chest x-ray on 518 showed improving deviations she was extubated. Lower extremity Doppler done on 01/24/2015 showed a bilateral lower extremity DVTs 1 IVC filter was placed.   Assessment/Plan: Hemorrhagic shock/acute blood loss anemia due to erosive esophagitis: -EGD done showed esophagitis, hemorraghic shock due to esophagitis plus anti-coagulation, now resolved status post 5 units of packed red blood cells. - His hemoglobin appears to be stable, keep hemoglobin greater than 8. Continue trend CBCs. - GI signed off on 01/18/2015, continue PPI twice a day there is no evidence of ongoing bleeding at this time.  Acute gallstone pancreatitis: MRCP suggested the possibility of a 4 mm stone within the common bile  duct. GI was consulted which recommended laparoscopic cholecystectomy with intraoperative cholangiogram. Surgery rec cholecystectomy as an outpatient.  NSTEMI with severe triple-vessel disease: - Cardiac cath revealed severe triple-vessel disease, Cardiology and TCTS suggest the patient not leave the hospital though patient will need to fully recover from his severe acute pancreatitis prior to surgery. - Start low dose metoprolol. Further titration per cardiology. - his Cr cont to improved ? If he might benfeit from ACE-I. - CT surgery to recommended further intervention.  Acute bilateral lower extremity DVT: - He is currently on only prophylactic Lovenox due to hemorrhagic shock. IVC filter was placed. Continue to monitor CBC. - will not be a candidate for anticoagulation due to GI bleeding.  Ileus: - Resolved with conservative management.  Acute kidney injury: - Renal ultrasound was unrevealing creatinine has improved significantly since admission. - Now improving with diuresis.  Hypocalcemia: Corrected calcium is actually 9. No further interventions.  Persisting hypokalemia: Corrected with supplementation.  Hypophosphatemia: Has normalized with supplementation.  Syncopal episode most likely vasovagal: No further episodes.  Bradycardia: Initially felt to be vagal mediated.  New diagnosis of acute systolic heart failure: Held ACE inhibitor due to acute kidney injury, he is currently on a beta blocker.  Thrombocytopenia  HIT panel negative - suspect due to acute inflam illness - no evidence of acute blood loss - platelet count has normalized  Hypotension > hypertension Hypotension resolved with volume expansion - blood pressure controlled  DM Oral intake improving, BG trending up. - will increase lantus.   Code Status: FULL Family Communication: No family present at time of exam today Disposition Plan: Insurance will not cover CIR - transfer to cardiac telemetry  bed while awaiting  CABG   Consultants:  [facility]  CVTS  Cardiology  Procedures: Significant Events: 5/08 Transfer from [place] after syncopal episode, gallstones, pancreatitis, elevated troponin I, abnormal echocardiogram 5/09 TTE: EF 30-40%, akinesis of basal-midinferior myocardium - severe hypokinesis lateral myocardium - grade 1 DD 5/12 LHC: severe 3 vessel disease  5/13 CT ABD/Pelvis: infiltration edema around pancreas, c/w acute pancreatitis, no abscess, cholelithiasis with stones in CBD, bilateral pleural effusions with compressive atx. 5/15 transfused one unit PRBCs 5/16 Transferred to ICU with acute respiratory distress. Intubated. PCCM consultation. CXR revealed BLL consolidation 5/16 transfused one unit PRBCs 5/16 EGD: grade 4 esophagitis with deep ulcers, eschar, friability, blood clllots in stomach, no gastric ulcers or mucosal abnormalities, mod edema of 2nd portion of duodenum c/w pancreatitis. PPI infusion initiated. Heparin disconitnued. OGT left out 5/17 transfused one unit PRBCs for Hgb 7.4 in setting of ACS 5/18 CXR with improved basilar aeration. Extubated 5/19 LE venous duplex scans + B LE DVT 5/19 IVC filter placed    Antibiotics: Zosyn 5/12 > 5/13 Vancomycin 5/16 > 5/19 Primaxin 5/16 > 5/25  HPI/Subjective: No complains  Objective: Filed Vitals:   01/31/15 2140 02/01/15 0501 02/01/15 1530 02/02/15 0420  BP: 116/66 127/57 125/53 111/52  Pulse: 82 78 79 79  Temp: 98.1 F (36.7 C) 97.6 F (36.4 C) 97.9 F (36.6 C) 98.1 F (36.7 C)  TempSrc: Oral Oral Oral Oral  Resp: 15 18 18 18   Height:      Weight:      SpO2: 99% 96% 98% 97%    Intake/Output Summary (Last 24 hours) at 02/02/15 0908 Last data filed at 02/02/15 0815  Gross per 24 hour  Intake    480 ml  Output    400 ml  Net     80 ml   Filed Weights   01/23/15 0600 01/24/15 0500 01/25/15 0400  Weight: 89.7 kg (197 lb 12 oz) 86.7 kg (191 lb 2.2 oz) 85.2 kg (187 lb 13.3 oz)     Exam:  General: Alert, awake, oriented x3, in no acute distress.  HEENT: No bruits, no goiter.  Heart: Regular rate and rhythm.lower ext edema. Lungs: Good air movement, clear Abdomen: Soft, nontender, nondistended, positive bowel sounds.  Neuro: Grossly intact, nonfocal.   Data Reviewed: Basic Metabolic Panel:  Recent Labs Lab 01/27/15 0400 01/28/15 0320 01/29/15 0052 01/29/15 0103 01/31/15 0429 02/02/15 0002  NA 147* 145 144  --  142 143  K 3.6 3.8 4.1  --  3.8 3.7  CL 117* 116* 116*  --  112* 110  CO2 23 23 20*  --  21* 22  GLUCOSE 146* 127* 218*  --  170* 152*  BUN 26* 27* 27*  --  27* 23*  CREATININE 1.80* 1.79* 1.84*  --  1.69* 1.53*  CALCIUM 6.7* 6.7* 6.8*  --  7.2* 7.1*  MG 1.8 2.1  --   --   --   --   PHOS  --   --   --  4.1  --   --    Liver Function Tests:  Recent Labs Lab 01/27/15 0400 01/28/15 0320 01/29/15 0052 01/31/15 0429  AST 139* 95* 63* 63*  ALT 94* 73* 57 47  ALKPHOS 74 61 63 78  BILITOT 1.4* 1.3* 1.1 1.0  PROT 5.1* 4.6* 5.1* 5.4*  ALBUMIN 1.3* 1.3* 1.4* 1.5*    Recent Labs Lab 01/27/15 0400  LIPASE 15*   No results for input(s): AMMONIA in the last 168 hours. CBC:  Recent Labs Lab 01/27/15 0400 01/28/15 0320 01/28/15 1825 01/29/15 0052 01/31/15 0429 02/02/15 0002  WBC 13.9* 10.9* 12.7* 11.2* 10.2 8.2  NEUTROABS 11.8* 9.0*  --   --   --   --   HGB 8.9* 8.5* 9.3* 8.7* 9.4* 8.6*  HCT 27.3* 26.5* 29.5* 27.6* 29.8* 26.5*  MCV 90.7 91.1 91.6 92.0 92.0 89.2  PLT 175 181 216 201 197 177   Cardiac Enzymes: No results for input(s): CKTOTAL, CKMB, CKMBINDEX, TROPONINI in the last 168 hours. BNP (last 3 results) No results for input(s): BNP in the last 8760 hours.  ProBNP (last 3 results) No results for input(s): PROBNP in the last 8760 hours.  CBG:  Recent Labs Lab 02/01/15 0615 02/01/15 1129 02/01/15 1611 02/01/15 2053 02/02/15 0615  GLUCAP 247* 224* 160* 143* 167*    Recent Results (from the past 240  hour(s))  Urine culture     Status: None   Collection Time: 01/28/15  6:26 PM  Result Value Ref Range Status   Specimen Description URINE, CLEAN CATCH  Final   Special Requests Normal  Final   Colony Count NO GROWTH Performed at [facility]   Final   Culture NO GROWTH Performed at [facility]   Final   Report Status 01/29/2015 FINAL  Final  Clostridium Difficile by PCR     Status: None   Collection Time: 02/01/15  5:16 AM  Result Value Ref Range Status   C difficile by pcr NEGATIVE NEGATIVE Final     Studies: No results found.  Scheduled Meds: . carvedilol  3.125 mg Oral BID WC  . enoxaparin (LOVENOX) injection  40 mg Subcutaneous Q24H  . feeding supplement (ENSURE ENLIVE)  237 mL Oral TID BM  . furosemide  40 mg Oral Daily  . insulin aspart  0-20 Units Subcutaneous TID WC  . insulin glargine  10 Units Subcutaneous BID  . latanoprost  1 drop Both Eyes QHS  . metoCLOPramide (REGLAN) injection  5 mg Intravenous 3 times per day  . pantoprazole sodium  40 mg Oral BID  . saccharomyces boulardii  250 mg Oral BID   Continuous Infusions:   Time Spent: 25 min   Marinda Elk  [facility] Pager [phone]. If 7PM-7AM, please contact night-coverage at [email or web address], password [facility] 02/02/2015, 9:08 AM  LOS: 20 days

## 2015-02-03 ENCOUNTER — Inpatient Hospital Stay (HOSPITAL_COMMUNITY): Payer: Medicare Other

## 2015-02-03 LAB — GLUCOSE, CAPILLARY
GLUCOSE-CAPILLARY: 184 mg/dL — AB (ref 65–99)
GLUCOSE-CAPILLARY: 252 mg/dL — AB (ref 65–99)
Glucose-Capillary: 125 mg/dL — ABNORMAL HIGH (ref 65–99)

## 2015-02-03 LAB — PREALBUMIN: Prealbumin: 6 mg/dL — ABNORMAL LOW (ref 18–38)

## 2015-02-03 NOTE — Progress Notes (Signed)
SUBJECTIVE: The patient is getting stronger. He has been ambulating in the hall. I had a long discussion with the patient and his daughter today. He will have pre-albumen check tomorrow. This will help [place] determine how he is progressing medically. Chest x-ray today shows marked improvement with only small residual effusions.   Filed Vitals:   02/02/15 0420 02/02/15 1500 02/02/15 2014 02/03/15 0536  BP: 111/52 125/59 114/46 111/53  Pulse: 79 84 83 74  Temp: 98.1 F (36.7 C) 98 F (36.7 C) 98.3 F (36.8 C) 98.3 F (36.8 C)  TempSrc: Oral Oral Oral Oral  Resp: Height:      Weight:      SpO2: 97% 100% 99% 98%     Intake/Output Summary (Last 24 hours) at 02/03/15 0947 Last data filed at 02/03/15 0825  Gross per 24 hour  Intake    120 ml  Output      0 ml  Net    120 ml    LABS: Basic Metabolic Panel:  Recent Labs  16/10/96 0002  NA 143  K 3.7  CL 110  CO2 22  GLUCOSE 152*  BUN 23*  CREATININE 1.53*  CALCIUM 7.1*   Liver Function Tests: No results for input(s): AST, ALT, ALKPHOS, BILITOT, PROT, ALBUMIN in the last 72 hours. No results for input(s): LIPASE, AMYLASE in the last 72 hours. CBC:  Recent Labs  02/02/15 0002  WBC 8.2  HGB 8.6*  HCT 26.5*  MCV 89.2  PLT 177   Cardiac Enzymes: No results for input(s): CKTOTAL, CKMB, CKMBINDEX, TROPONINI in the last 72 hours. BNP: Invalid input(s): POCBNP D-Dimer: No results for input(s): DDIMER in the last 72 hours. Hemoglobin A1C: No results for input(s): HGBA1C in the last 72 hours. Fasting Lipid Panel: No results for input(s): CHOL, HDL, LDLCALC, TRIG, CHOLHDL, LDLDIRECT in the last 72 hours. Thyroid Function Tests: No results for input(s): TSH, T4TOTAL, T3FREE, THYROIDAB in the last 72 hours.  Invalid input(s): FREET3  RADIOLOGY: Ct Abdomen Pelvis Wo Contrast  01/18/2015   CLINICAL DATA:  Nausea and vomiting after cardiac catheterization yesterday.  EXAM: CT ABDOMEN AND PELVIS WITHOUT  CONTRAST  TECHNIQUE: Multidetector CT imaging of the abdomen and pelvis was performed following the standard protocol without IV contrast.  COMPARISON:  Abdomen 01/17/2015  FINDINGS: Bilateral pleural effusions with basilar atelectasis. Coronary artery calcifications. Residual contrast material in the esophagus may represent reflux or dysmotility.  Cholelithiasis with multiple small stones in the gallbladder. No gallbladder wall thickening. Stones are present in the common bile duct. No significant bile duct dilatation. There is diffuse inflammatory infiltration and edema in the fat around the pancreas consistent with acute pancreatitis. Calcifications in the head of the pancreas may represent stones or postinflammatory calcifications. No loculated fluid collection is demonstrated to suggest abscess.  The unenhanced appearance of the liver, spleen, adrenal glands, inferior vena cava, and retroperitoneal lymph nodes is unremarkable. Calcification of the aorta without aneurysm. Bilateral low-attenuation renal lesions likely representing cyst but too small to characterize. No hydronephrosis in either stomach, small bowel, and colon are not abnormally distended. There is suggestion of wall thickening in some of the small bowel loops possibly indicating enteritis. Kidney. Small amount of free fluid in the abdomen and pelvis. No free air.  Pelvis: Appendix is normal. Bladder wall is not thickened. Prostate gland is not enlarged. No pelvic mass or lymphadenopathy. Degenerative changes in the spine. No destructive bone lesions. Diffuse subcutaneous soft tissue  edema.  IMPRESSION: Infiltration edema around the pancreas with fluid in the pericolic gutters and pelvis. Changes are consistent with acute pancreatitis. No abscess identified. Cholelithiasis with stones appearing in the common bile duct. No bile duct dilatation. Bilateral pleural effusions with basilar atelectasis.   Electronically Signed   By: Burman Nieves M.D.    On: 01/18/2015 03:22   Dg Chest 2 View  02/03/2015   CLINICAL DATA:  Shortness of breath.  Follow-up exam.  EXAM: CHEST  2 VIEW  COMPARISON:  01/25/2015  FINDINGS: Lung aeration has significantly improved. There is persistent lung base opacity consistent with a combination of atelectasis and small pleural effusions. No evidence of residual pulmonary edema.  No pneumothorax.  Cardiac silhouette normal in size and configuration. Normal mediastinal and hilar contours.  Right internal jugular central venous line has been removed. No remaining indwelling support apparatus.  IMPRESSION: 1. There has been significant improvement since the previous chest radiograph. No evidence of residual pulmonary edema or pneumonia. 2. Mild lung base atelectasis and small pleural effusions persist.   Electronically Signed   By: Amie Portland M.D.   On: 02/03/2015 07:47   Dg Abd 1 View  01/15/2015   CLINICAL DATA:  Ileus  EXAM: ABDOMEN - 1 VIEW  COMPARISON:  None.  FINDINGS: Scattered large and small bowel gas is noted. Mildly dilated loops of small bowel are to in the mid abdomen. No free air is noted. No definitive obstructive changes are seen.  IMPRESSION: Mild dilatation of small bowel likely representing a small-bowel ileus. Correlation with the physical exam is recommended.   Electronically Signed   By: Alcide Clever M.D.   On: 01/15/2015 09:18   Ir Ivc Filter Plmt / S&i /img Guid/mod Sed  01/24/2015   CLINICAL DATA:  Acute DVT. GI bleed, a relative contraindication to anticoagulation. Caval filtration is requested. Renal insufficiency. Indwelling right IJ triple-lumen catheter.  EXAM: INFERIOR VENACAVOGRAM  IVC FILTER PLACEMENT UNDER FLUOROSCOPY  FLUOROSCOPY TIME:  30 seconds, 35.6 mGy  TECHNIQUE: Patency of the left IJ vein was confirmed with ultrasound with image documentation. An appropriate skin site was determined. Skin site was marked, prepped with chlorhexidine, and draped using maximum barrier technique. The  region was infiltrated locally with 1% lidocaine.  Intravenous Fentanyl and Versed were administered as conscious sedation during continuous cardiorespiratory monitoring by the radiology RN, with a total moderate sedation time of less than 30 minutes.  Under real-time ultrasound guidance, the left IJ vein was accessed with a 21 gauge micropuncture needle; the needle tip within the vein was confirmed with ultrasound image documentation. The needle was exchanged over a 018 guidewire for a transitional dilator, which allow advancement of the [facility] wire into the IVC. A long 6 French vascular sheath was placed for inferior venacavography using CO2. This demonstrated no caval thrombus. Renal vein inflows were evident.  The [facility] IVC filter was advanced through the sheath and successfully deployed under fluoroscopy at the L2 level. Followup CO2 cavagram demonstrates stable filter position and no evident complication. The sheath was removed and hemostasis achieved at the site. No immediate complication.  IMPRESSION: 1. Normal IVC. No thrombus or significant anatomic variation. 2. Technically successful infrarenal IVC filter placement. This is a retrievable model.   Electronically Signed   By: Corlis Leak M.D.   On: 01/24/2015 17:44   US Renal  01/16/2015   CLINICAL DATA:  Acute renal failure  EXAM: RENAL / URINARY TRACT ULTRASOUND COMPLETE  COMPARISON:  None.  FINDINGS: Right Kidney:  Length: 11.4 cm. Echogenicity within normal limits. No mass or hydronephrosis visualized.  Left Kidney:  Length: 11.9 cm. Echogenicity within normal limits. No mass or hydronephrosis visualized.  Bladder:  Appears normal for degree of bladder distention.  Small amount of ascites.  IMPRESSION: Normal renal ultrasound.   Electronically Signed   By: Amie Portland M.D.   On: 01/16/2015 15:58   Mr Abdomen Mrcp Wo Cm  01/16/2015   CLINICAL DATA:  Cholelithiasis with abdominal pain.  EXAM: MRI ABDOMEN WITHOUT CONTRAST  (INCLUDING MRCP)   TECHNIQUE: Multiplanar multisequence MR imaging of the abdomen was performed. Heavily T2-weighted images of the biliary and pancreatic ducts were obtained, and three-dimensional MRCP images were rendered by post processing.  COMPARISON:  None.  FINDINGS: Exam is limited due to patient motion. Patient had difficult tolerating the length of the exam as well as difficulty breath holding.  Lower chest:  There is dense bibasilar atelectasis.  Hepatobiliary: There is no intrahepatic biliary duct dilatation. No discrete hepatic lesion on this noncontrast exam. There is loss of signal intensity on the opposed phase imaging consistent with hepatic steatosis.  There are several small gallstones layering dependently within the lumen of the gallbladder. These measure approximately 6. There is no gallbladder inflammation evident. Several stones appear to be with in the neck of the gallbladder (image 16, series 3).  The common bile duct and common hepatic duct are not dilated. There is a filling defect within the common bile duct on image 17, series 3. This is small measuring approximately 4 mm. This appears to be replicated on the heavily T2 weighted series 20 on image 3.  Pancreas: There is extensive inflammation involving the head body and tail of the pancreas suggesting acute pancreatitis. Pancreatic duct is difficult to visualize on the sequences due to respiratory motion but does not appear dilated. There is fluid along the anterior perirenal fascia left and right. Small amount fluid around the liver and spleen. No organized fluid collections are evident.  Spleen: Normal spleen.  Adrenals/urinary tract: There several renal lesions have high signal intensity T2 weighted imaging suggesting renal cysts. No hydronephrosis.  Stomach/Bowel: Stomach and limited of the small bowel is unremarkable  Vascular/Lymphatic: Abdominal aortic normal caliber. No retroperitoneal periportal lymphadenopathy.  Musculoskeletal: No aggressive  osseous lesion  IMPRESSION: 1. Acute pancreatitis. No organized fluid collections. Enhancement pattern of pancreas cannot be determined. 2. Cholelithiasis without evidence of acute cholecystitis. 3. Potential small 4 mm filling defect within the mid common bile duct. Diagnostic confidence is limited due to patient motion and breathing. No common bile duct dilatation or biliary duct dilatation. 4. Hepatic steatosis. 5. Dense bibasilar atelectasis .   Electronically Signed   By: Genevive Bi M.D.   On: 01/16/2015 08:25   Dg Chest Port 1 View  01/25/2015   CLINICAL DATA:  Respiratory failure.  EXAM: PORTABLE CHEST - 1 VIEW  COMPARISON:  01/24/2015 .  FINDINGS: Right IJ line stable position. Stable cardiomegaly. Diffuse pulmonary alveolar infiltrates in pleural effusions again noted. No interim change . No pneumothorax. No acute bony abnormality P  IMPRESSION: 1. Right IJ line stable position. 2. Persistent cardiomegaly with bilateral pulmonary alveolar infiltrates and pleural effusions consistent with congestive heart failure. No interim change. Superimposed pneumonia cannot be excluded.   Electronically Signed   By: Maisie Fus  Register   On: 01/25/2015 07:22   Dg Chest Port 1 View  01/24/2015   CLINICAL DATA:  Respiratory failure.  EXAM: PORTABLE CHEST -  1 VIEW  COMPARISON:  01/23/2015 .  FINDINGS: Interim extubation. Right IJ line in stable position. Mediastinum and hilar structures stable. Cardiomegaly. Bilateral pulmonary alveolar infiltrates and pleural effusions. These findings are consistent with congestive heart failure. Bilateral pneumonia cannot be excluded. No pneumothorax.  IMPRESSION: 1. Interim removal of endotracheal tube. Right IJ line in stable position. 2. Persistent cardiomegaly with progressed bilateral prominent pulmonary alveolar infiltrates and pleural effusions consistent with congestive heart failure. Bilateral pneumonia cannot be excluded.   Electronically Signed   By: Maisie Fus  Register    On: 01/24/2015 07:31   Dg Chest Port 1 View  01/23/2015   CLINICAL DATA:  Respiratory failure.  EXAM: PORTABLE CHEST - 1 VIEW  COMPARISON:  01/22/2015.  FINDINGS: Endotracheal tube and IJ line in stable position. Mediastinum and hilar structures are normal. Stable cardiomegaly and pulmonary vascularity. Persistent bibasilar pulmonary infiltrates and pleural effusions. These findings could be related congestive heart failure and/or bilateral pneumonia. No pneumothorax.  IMPRESSION: 1. Endotracheal tube and right IJ line in stable position. 2. Persistent bilateral basilar pulmonary alveolar infiltrates with small pleural effusions. These changes could be related to congestive heart failure and/or bilateral pneumonia. No significant interim change.   Electronically Signed   By: Maisie Fus  Register   On: 01/23/2015 07:10   Dg Chest Port 1 View  01/22/2015   CLINICAL DATA:  Central line placement  EXAM: PORTABLE CHEST - 1 VIEW  COMPARISON:  Portable exam 2037 hours compared to 01/22/2015  FINDINGS: Tip of endotracheal tube projects approximately 4.1 cm above carina.  Interval removal of LEFT jugular line.  New RIGHT jugular central venous catheter with tip projecting over SVC.  Costophrenic angles excluded, repeat image declined by ordering physician.  Bibasilar effusions and atelectasis.  Upper lungs clear.  No pneumothorax.  IMPRESSION: No pneumothorax following RIGHT jugular line placement.  Persistent bibasilar effusions and atelectasis.   Electronically Signed   By: Ulyses Southward M.D.   On: 01/22/2015 21:13   Dg Chest Port 1 View  01/22/2015   CLINICAL DATA:  Shortness of breath.  Intubation.  EXAM: PORTABLE CHEST - 1 VIEW  COMPARISON:  01/21/2015.  FINDINGS: Endotracheal tube 2.1 cm above the carina. Left IJ line in stable position. Interim removal of NG tube. Stable cardiomegaly with bilateral pulmonary alveolar infiltrates and pleural effusions consistent with congestive heart failure. No pneumothorax.   IMPRESSION: 1. Endotracheal tube 2.1 cm above the carina. Left IJ line in stable position Interim removal of NG tube. 2. Persistent changes of congestive heart failure with bilateral pulmonary edema and pleural effusions.   Electronically Signed   By: Maisie Fus  Register   On: 01/22/2015 07:21   Dg Chest Port 1 View  01/21/2015   CLINICAL DATA:  ET tube placement.  EXAM: PORTABLE CHEST - 1 VIEW  COMPARISON:  01/21/2015  FINDINGS: Endotracheal tube is slightly above the carina, approximately 15 mm, directed toward the right mainstem bronchus. Left central line tip is in the SVC. No pneumothorax. Bilateral lower lobe airspace opacities. Heart is normal size. Possible small layering effusions. NG tube enters the stomach. No acute bony abnormality.  IMPRESSION: Endotracheal tube approximately 15 mm above the carina, directed toward the right mainstem bronchus. This could be retracted 1-2 cm for optimal positioning.  Bibasilar atelectasis or infiltrates. Question small layering effusions.   Electronically Signed   By: Charlett Nose M.D.   On: 01/21/2015 11:57   Dg Chest Port 1 View  01/21/2015   CLINICAL DATA:  Lethargy, shortness  of breath, epigastric pain  EXAM: PORTABLE CHEST - 1 VIEW  COMPARISON:  01/19/2015  FINDINGS: Cardiomediastinal silhouette is stable. Persistent bilateral small pleural effusion with bilateral basilar atelectasis or infiltrate. No convincing pulmonary edema.  IMPRESSION: Persistent bilateral small pleural effusion with bilateral basilar atelectasis or infiltrate. No pulmonary edema.   Electronically Signed   By: Natasha Mead M.D.   On: 01/21/2015 08:57   Dg Chest Port 1 View  01/19/2015   CLINICAL DATA:  Chest congestionPr does have a NG tube placed currently Hx of HTN, DM, cholelithiasis  EXAM: PORTABLE CHEST - 1 VIEW  COMPARISON:  01/18/2015.  FINDINGS: Bibasilar lung opacity is stable, obscuring both hemidiaphragms. This is consistent with combination of moderate pleural effusions with  lung base atelectasis, pneumonia or a combination. There is no pulmonary edema. Remainder of the lungs is clear.  No pneumothorax.  Cardiac silhouette is normal in size. No mediastinal or hilar masses.  Nasogastric tube passes below the diaphragm, stable.  IMPRESSION: 1. No significant change from the previous day's study. 2. Persistent bilateral lung base opacity consistent with a combination of moderate pleural effusions with atelectasis, pneumonia or a combination. No pulmonary edema. No pneumothorax.   Electronically Signed   By: Amie Portland M.D.   On: 01/19/2015 09:06   Dg Chest Port 1 View  01/18/2015   CLINICAL DATA:  Shortness of breath.  EXAM: PORTABLE CHEST - 1 VIEW  COMPARISON:  None.  FINDINGS: NG tube noted with tip below left hemidiaphragm. Mediastinum and hilar structures are normal. Heart size normal. Bilateral pulmonary infiltrates and pleural effusions noted. No pneumothorax. No acute bony abnormality .  IMPRESSION: 1. NG tube noted with tip below left hemidiaphragm. 2. Bilateral prominent basilar pulmonary infiltrates. These changes could be related to pulmonary edema and/or pneumonia. Small bilateral pleural effusions.   Electronically Signed   By: Maisie Fus  Register   On: 01/18/2015 07:25   Dg Abd Portable 1v  01/23/2015   CLINICAL DATA:  Followup adynamic ileus.  EXAM: PORTABLE ABDOMEN - 1 VIEW  COMPARISON:  CT, 01/18/2015  FINDINGS: Normal bowel gas pattern. No evidence of bowel obstruction or significant adynamic ileus.  A round density projects in the right mid abdomen. This could reflect a gallstone. There other densities are upper quadrant consistent with the known gallstones.  Mild vascular calcifications.  Soft tissues otherwise unremarkable.  IMPRESSION: 1. No evidence of bowel obstruction. No evidence of a significant residual adynamic ileus. No acute finding.   Electronically Signed   By: Amie Portland M.D.   On: 01/23/2015 12:48   Dg Abd Portable 1v  01/17/2015   CLINICAL  DATA:  Abdominal pain for 3 days, vomiting for 1 day  EXAM: PORTABLE ABDOMEN - 1 VIEW  COMPARISON:  01/15/2015  FINDINGS: The bowel gas pattern is normal. No radio-opaque calculi or other significant radiographic abnormality are seen. Some colonic gas is noted in right colon and mid transverse colon without significant distension  IMPRESSION: Negative.   Electronically Signed   By: Natasha Mead M.D.   On: 01/17/2015 19:51   Dg Swallowing Func-speech Pathology  01/24/2015    Objective Swallowing Evaluation:   MBS Patient Details  Name: Dax Murguia MRN: [id number] Date of Birth: 1940/12/04  Today's Date: 01/24/2015 Time: SLP Start Time (ACUTE ONLY): 1315-SLP Stop Time (ACUTE ONLY): 1340 SLP Time Calculation (min) (ACUTE ONLY): 25 min  Past Medical History:  Past Medical History  Diagnosis Date  . Hypertension   . Diabetes mellitus type  2 in obese   . Cholelithiasis    Past Surgical History:  Past Surgical History  Procedure Laterality Date  . Cardiac catheterization N/A 01/17/2015    Procedure: Left Heart Cath and Coronary Angiography;  Surgeon: Lyn Records, MD;  Location[facility] INVASIVE CV LAB;  Service: Cardiovascular;   Laterality: N/A;  . Esophagogastroduodenoscopy N/A 01/21/2015    Procedure: ESOPHAGOGASTRODUODENOSCOPY (EGD);  Surgeon: Hilarie Fredrickson, MD;   Location: [facility] ENDOSCOPY;  Service: Endoscopy;  Laterality: N/A;  to be  done at BEDSIDE   HPI:  Other Pertinent Information: 74 y/o M admitted 5/8 after a syncopal  episode and found to have acute pancreatitis. Also positive  troponin/NSTEMI. EGD shows grade 4 esophagitis with deep ulcers, eschar,  friability, blood clots in stomach. Intubated from 5/16 to 5/18. Pt  reprotedly having difficulty swallowing dinner on 5/18.   No Data Recorded  Assessment / Plan / Recommendation CHL IP CLINICAL IMPRESSIONS 01/24/2015  Therapy Diagnosis Moderate oral phase dysphagia;Moderate pharyngeal phase  dysphagia;Moderate cervical esophageal phase dysphagia  Clinical Impression MBS  complete. Overt s/s of aspiration not seen during  exam this afternoon as noted on bedside eval this am.  Patient does  present with what appears to be a multifactorial dysphagia with laryngeal  and pharyngeal weakness and possible esophageal component, resulting  decreased pharyngeal squeeze, decreased relaxation of the UES, and  moderate pharyngeal residuals post swallow, greater with solids vs  liquids. Despite above however, patient fully protecting airway during  study. Should be notes that patient consuming solids and liquids via very  small boluses (less than 1/2 tsp) despite max clinician encouragement for  larger size; ? cognitive status contributing as patient also presents with  decreased sustained attention to task. Aspiration risk remains moderately  high given degree of residuals without patient awareness or ability to  clear within a reasonable time frame. Will initiate a po diet and f/u  closely for tolerance and potential to advance.       CHL IP TREATMENT RECOMMENDATION 01/24/2015  Treatment Recommendations Therapy as outlined in treatment plan below     CHL IP DIET RECOMMENDATION 01/24/2015  SLP Diet Recommendations Dysphagia 1 (Puree);Thin  Liquid Administration via (None)  Medication Administration Crushed with puree  Compensations Small sips/bites;Slow rate;Multiple dry swallows after each  bite/sip;Follow solids with liquid  Postural Changes and/or Swallow Maneuvers (None)     CHL IP OTHER RECOMMENDATIONS 01/24/2015  Recommended Consults (None)  Oral Care Recommendations Oral care BID  Other Recommendations (None)     No flowsheet data found.   CHL IP FREQUENCY AND DURATION 01/24/2015  Speech Therapy Frequency (ACUTE ONLY) min 2x/week  Treatment Duration 1 week         SLP Swallow Goals No flowsheet data found.  No flowsheet data found.    CHL IP REASON FOR REFERRAL 01/24/2015  Reason for Referral Objectively evaluate swallowing function     Ferdinand Lango MA, CCC-SLP [phone]               Ferdinand Lango Meryl 01/24/2015, 1:52 PM     PHYSICAL EXAM  patient is oriented to person time and place. Affect is normal. His daughter is in the room. Lungs reveal scattered rhonchi. Cardiac exam reveals S1 and S2. The abdomen is soft. He still has residual edema.   TELEMETRY: I have reviewed telemetry today August 06, 2015. There is normal sinus rhythm.   ASSESSMENT AND PLAN:     Acute gallstone pancreatitis  He continues to recover from this.    AKI (acute kidney injury)     Renal function continues to improve    NSTEMI (non-ST elevated myocardial infarction)   Cardiomyopathy      The plan is to proceed with bypass surgery when he is medically stable. We will last the cardiothoracic surgery team to reassess the patient tomorrow to see if we can get further estimates on the timing of possible surgery     Melena    We will ask for further GI assessment tomorrow to get a better feeling on when anticoagulate or antiplatelets therapy could possibly be used.    DVT (deep venous thrombosis)     Patient has an IVC filter in place. Currently he is receiving subcutaneous heparin. Going forward we will have to decide when this can be switched to oral anticoagulates. We will await further GI input concerning this.   This is a very complex patient. He continues to improve. We need to very carefully continue to follow all of the medical questions. He is not yet ready for discharge from the hospital.   Willa Rough 02/03/2015 9:47 AM

## 2015-02-03 NOTE — Progress Notes (Signed)
Pt assisted to ambulate 300 ft in hall using rollator.  Slow steady gait, no rests.  Recliner after walk.  Will con't plan of care.

## 2015-02-03 NOTE — Progress Notes (Addendum)
[facility] PROGRESS NOTE Interim History: 74 year old male who presented to [facility] hospital for syncopal episode and bradycardia which was later attribute to vasovagal response. Would have persisting epigastric pain with nausea. In the ED at [facility] a CT scan of the abdomen and pelvis show very stone lipase was greater than 300 LFTs were elevated lactic acid of 2.2 with a creatinine 1.7 she was transferred to  [facility] hospital. 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 CT scan of the abdomen and pelvis showed edema around the pancreas compatible with pancreatitis no abscess choledocholithiasis with stone in common bile duct and pleural effusion. She didn't had hemorrhagic shock and she had to be several transfused unit of packed red blood cells. Transferred to the ICU for acute respiratory distress intubated and PCCM took over with a chest x-ray which revealed bilateral consolidation. An EGD was performed that showed a grade 4 esophagitis with deep ulcers and friable mucosa. Anticoagulants were held along with aspirin. She is only on Lovenox for DVT prophylaxis. Chest x-ray on 518 showed improving deviations she was extubated. Lower extremity Doppler done on 01/24/2015 showed a bilateral lower extremity DVTs, IVC filter was placed.   Assessment/Plan: Hemorrhagic shock/acute blood loss anemia due to erosive esophagitis: -EGD done showed esophagitis, hemorraghic shock due to esophagitis plus anti-coagulation, now resolved status post 5 units of packed red blood cells.  keep hemoglobin greater than 8.  - His hemoglobin appears to be stable. Continue trend CBCs. - Continue PPI twice a day there is no evidence of ongoing bleeding at this time. - We'll have to discuss with GI when to start anticoagulation.  Acute gallstone pancreatitis: - MRCP suggested the possibility of a  4 mm stone within the common bile duct. - GI was consulted which recommended laparoscopic cholecystectomy with intraoperative cholangiogram. - Surgery rec cholecystectomy as an outpatient. - He follow-up with surgery as an outpatient after surgery triple-vessel disease problem resolved.  NSTEMI with severe triple-vessel disease: - Cardiac cath revealed severe triple-vessel disease, Cardiology and TCTS suggest the patient not leave the hospital though patient will need to fully recover from his severe acute pancreatitis prior to surgery. - Continue Coreg. Further titration per cardiology. - His Cr cont to improved ? If he might benfeit from ACE-I. - CT surgery to recommended further intervention.  Acute bilateral lower extremity DVT: - He is currently on only prophylactic Lovenox due to hemorrhagic shock.  - IVC filter was placed. Continue to monitor CBC. - will not be a candidate for anticoagulation due to GI bleeding.  Ileus: - Resolved with conservative management.  Acute kidney injury: - Renal ultrasound showed no acute findings.  - Now improving with diuresis.  Hypocalcemia: Corrected calcium is actually 9. No further interventions.  Persisting hypokalemia: Corrected with supplementation.  Hypophosphatemia: Has normalized with supplementation.  Syncopal episode most likely vasovagal: No further episodes.  Bradycardia: Initially felt to be vagal mediated.  New diagnosis of acute systolic heart failure: Held ACE inhibitor due to acute kidney injury, he is currently on a beta blocker.  Thrombocytopenia  HIT panel negative - suspect due to acute inflam illness - no evidence of acute blood loss - platelet count has normalized  Hypotension > hypertension Hypotension resolved with volume expansion - blood pressure controlled  DM Oral intake improving, BG trending up. - will increase lantus.   Code Status: FULL Family Communication: No family present  at time of exam  today Disposition Plan: Insurance will not cover CIR - transfer to cardiac telemetry bed while awaiting CABG   Consultants:  [facility]  CVTS  Cardiology  Procedures: Significant Events: 5/08 Transfer from [place] after syncopal episode, gallstones, pancreatitis, elevated troponin I, abnormal echocardiogram 5/09 TTE: EF 30-40%, akinesis of basal-midinferior myocardium - severe hypokinesis lateral myocardium - grade 1 DD 5/12 LHC: severe 3 vessel disease  5/13 CT ABD/Pelvis: infiltration edema around pancreas, c/w acute pancreatitis, no abscess, cholelithiasis with stones in CBD, bilateral pleural effusions with compressive atx. 5/15 transfused one unit PRBCs 5/16 Transferred to ICU with acute respiratory distress. Intubated. PCCM consultation. CXR revealed BLL consolidation 5/16 transfused one unit PRBCs 5/16 EGD: grade 4 esophagitis with deep ulcers, eschar, friability, blood clllots in stomach, no gastric ulcers or mucosal abnormalities, mod edema of 2nd portion of duodenum c/w pancreatitis. PPI infusion initiated. Heparin disconitnued. OGT left out 5/17 transfused one unit PRBCs for Hgb 7.4 in setting of ACS 5/18 CXR with improved basilar aeration. Extubated 5/19 LE venous duplex scans + B LE DVT 5/19 IVC filter placed    Antibiotics: Zosyn 5/12 > 5/13 Vancomycin 5/16 > 5/19 Primaxin 5/16 > 5/25  HPI/Subjective: No complains, continues to ambulate without any symptoms.  Objective: Filed Vitals:   02/02/15 0420 02/02/15 1500 02/02/15 2014 02/03/15 0536  BP: 111/52 125/59 114/46 111/53  Pulse: 79 84 83 74  Temp: 98.1 F (36.7 C) 98 F (36.7 C) 98.3 F (36.8 C) 98.3 F (36.8 C)  TempSrc: Oral Oral Oral Oral  Resp: Height:      Weight:      SpO2: 97% 100% 99% 98%    Intake/Output Summary (Last 24 hours) at 02/03/15 1025 Last data filed at 02/03/15 0825  Gross per 24 hour  Intake    120 ml  Output      0 ml  Net    120 ml   Filed Weights     01/23/15 0600 01/24/15 0500 01/25/15 0400  Weight: 89.7 kg (197 lb 12 oz) 86.7 kg (191 lb 2.2 oz) 85.2 kg (187 lb 13.3 oz)    Exam:  General: Alert, awake, oriented x3, in no acute distress.  HEENT: No bruits, no goiter.  Heart: Regular rate and rhythm.lower ext edema. Lungs: Good air movement, clear Abdomen: Soft, nontender, nondistended, positive bowel sounds.  Neuro: Grossly intact, nonfocal.   Data Reviewed: Basic Metabolic Panel:  Recent Labs Lab 01/28/15 0320 01/29/15 0052 01/29/15 0103 01/31/15 0429 02/02/15 0002  NA 145 144  --  142 143  K 3.8 4.1  --  3.8 3.7  CL 116* 116*  --  112* 110  CO2 23 20*  --  21* 22  GLUCOSE 127* 218*  --  170* 152*  BUN 27* 27*  --  27* 23*  CREATININE 1.79* 1.84*  --  1.69* 1.53*  CALCIUM 6.7* 6.8*  --  7.2* 7.1*  MG 2.1  --   --   --   --   PHOS  --   --  4.1  --   --    Liver Function Tests:  Recent Labs Lab 01/28/15 0320 01/29/15 0052 01/31/15 0429  AST 95* 63* 63*  ALT 73* 57 47  ALKPHOS 61 63 78  BILITOT 1.3* 1.1 1.0  PROT 4.6* 5.1* 5.4*  ALBUMIN 1.3* 1.4* 1.5*   No results for input(s): LIPASE, AMYLASE in the last 168 hours. No results for input(s):  AMMONIA in the last 168 hours. CBC:  Recent Labs Lab 01/28/15 0320 01/28/15 1825 01/29/15 0052 01/31/15 0429 02/02/15 0002  WBC 10.9* 12.7* 11.2* 10.2 8.2  NEUTROABS 9.0*  --   --   --   --   HGB 8.5* 9.3* 8.7* 9.4* 8.6*  HCT 26.5* 29.5* 27.6* 29.8* 26.5*  MCV 91.1 91.6 92.0 92.0 89.2  PLT 181 216 201 197 177   Cardiac Enzymes: No results for input(s): CKTOTAL, CKMB, CKMBINDEX, TROPONINI in the last 168 hours. BNP (last 3 results) No results for input(s): BNP in the last 8760 hours.  ProBNP (last 3 results) No results for input(s): PROBNP in the last 8760 hours.  CBG:  Recent Labs Lab 02/02/15 0615 02/02/15 1121 02/02/15 1632 02/02/15 2118 02/03/15 0607  GLUCAP 167* 175* 168* 165* 184*    Recent Results (from the past 240 hour(s))  Urine  culture     Status: None   Collection Time: 01/28/15  6:26 PM  Result Value Ref Range Status   Specimen Description URINE, CLEAN CATCH  Final   Special Requests Normal  Final   Colony Count NO GROWTH Performed at [facility]   Final   Culture NO GROWTH Performed at [facility]   Final   Report Status 01/29/2015 FINAL  Final  Clostridium Difficile by PCR     Status: None   Collection Time: 02/01/15  5:16 AM  Result Value Ref Range Status   C difficile by pcr NEGATIVE NEGATIVE Final     Studies: Dg Chest 2 View  02/03/2015   CLINICAL DATA:  Shortness of breath.  Follow-up exam.  EXAM: CHEST  2 VIEW  COMPARISON:  01/25/2015  FINDINGS: Lung aeration has significantly improved. There is persistent lung base opacity consistent with a combination of atelectasis and small pleural effusions. No evidence of residual pulmonary edema.  No pneumothorax.  Cardiac silhouette normal in size and configuration. Normal mediastinal and hilar contours.  Right internal jugular central venous line has been removed. No remaining indwelling support apparatus.  IMPRESSION: 1. There has been significant improvement since the previous chest radiograph. No evidence of residual pulmonary edema or pneumonia. 2. Mild lung base atelectasis and small pleural effusions persist.   Electronically Signed   By: Amie Portland M.D.   On: 02/03/2015 07:47    Scheduled Meds: . carvedilol  6.25 mg Oral BID WC  . enoxaparin (LOVENOX) injection  40 mg Subcutaneous Q24H  . feeding supplement (ENSURE ENLIVE)  237 mL Oral TID BM  . furosemide  40 mg Oral Daily  . insulin aspart  0-20 Units Subcutaneous TID WC  . insulin glargine  10 Units Subcutaneous BID  . latanoprost  1 drop Both Eyes QHS  . metoCLOPramide (REGLAN) injection  5 mg Intravenous 3 times per day  . pantoprazole sodium  40 mg Oral BID  . saccharomyces boulardii  250 mg Oral BID   Continuous Infusions:   Time Spent: 25 min   Marinda Elk  [facility] Pager [phone]. If 7PM-7AM, please contact night-coverage at [email or web address], password [facility] 02/03/2015, 10:25 AM  LOS: 21 days

## 2015-02-03 NOTE — Progress Notes (Signed)
Pt assisted to ambulate in hall approx 225 ft, slow steady gait with no rests.  To recliner after walk with several family members in room.  Will con't plan of care.

## 2015-02-04 DIAGNOSIS — I255 Ischemic cardiomyopathy: Secondary | ICD-10-CM

## 2015-02-04 DIAGNOSIS — E876 Hypokalemia: Secondary | ICD-10-CM | POA: Insufficient documentation

## 2015-02-04 LAB — PREPARE RBC (CROSSMATCH)

## 2015-02-04 LAB — CBC
HCT: 29.7 % — ABNORMAL LOW (ref 39.0–52.0)
HEMATOCRIT: 24.8 % — AB (ref 39.0–52.0)
Hemoglobin: 7.9 g/dL — ABNORMAL LOW (ref 13.0–17.0)
Hemoglobin: 9.6 g/dL — ABNORMAL LOW (ref 13.0–17.0)
MCH: 28.3 pg (ref 26.0–34.0)
MCH: 28.6 pg (ref 26.0–34.0)
MCHC: 31.9 g/dL (ref 30.0–36.0)
MCHC: 32.3 g/dL (ref 30.0–36.0)
MCV: 87.6 fL (ref 78.0–100.0)
MCV: 89.9 fL (ref 78.0–100.0)
PLATELETS: 181 10*3/uL (ref 150–400)
Platelets: 163 10*3/uL (ref 150–400)
RBC: 2.76 MIL/uL — ABNORMAL LOW (ref 4.22–5.81)
RBC: 3.39 MIL/uL — ABNORMAL LOW (ref 4.22–5.81)
RDW: 13.7 % (ref 11.5–15.5)
RDW: 14.5 % (ref 11.5–15.5)
WBC: 6.2 10*3/uL (ref 4.0–10.5)
WBC: 6.8 10*3/uL (ref 4.0–10.5)

## 2015-02-04 LAB — BASIC METABOLIC PANEL
Anion gap: 8 (ref 5–15)
BUN: 20 mg/dL (ref 6–20)
CO2: 25 mmol/L (ref 22–32)
Calcium: 7.1 mg/dL — ABNORMAL LOW (ref 8.9–10.3)
Chloride: 110 mmol/L (ref 101–111)
Creatinine, Ser: 1.35 mg/dL — ABNORMAL HIGH (ref 0.61–1.24)
GFR calc Af Amer: 59 mL/min — ABNORMAL LOW (ref >60)
GFR calc non Af Amer: 51 mL/min — ABNORMAL LOW (ref >60)
Glucose, Bld: 147 mg/dL — ABNORMAL HIGH (ref 65–99)
Potassium: 3.3 mmol/L — ABNORMAL LOW (ref 3.5–5.1)
SODIUM: 143 mmol/L (ref 135–145)

## 2015-02-04 LAB — GLUCOSE, CAPILLARY
GLUCOSE-CAPILLARY: 126 mg/dL — AB (ref 65–99)
GLUCOSE-CAPILLARY: 175 mg/dL — AB (ref 65–99)
GLUCOSE-CAPILLARY: 155 mg/dL — AB (ref 65–99)
Glucose-Capillary: 185 mg/dL — ABNORMAL HIGH (ref 65–99)

## 2015-02-04 MED ORDER — POTASSIUM CHLORIDE CRYS ER 20 MEQ PO TBCR
40.0000 meq | EXTENDED_RELEASE_TABLET | Freq: Once | ORAL | Status: AC
  Administered 2015-02-04: 40 meq via ORAL
  Filled 2015-02-04: qty 2

## 2015-02-04 MED ORDER — POTASSIUM CHLORIDE CRYS ER 20 MEQ PO TBCR
40.0000 meq | EXTENDED_RELEASE_TABLET | Freq: Two times a day (BID) | ORAL | Status: AC
  Administered 2015-02-04 (×2): 40 meq via ORAL
  Filled 2015-02-04 (×2): qty 2

## 2015-02-04 MED ORDER — FUROSEMIDE 10 MG/ML IJ SOLN
40.0000 mg | Freq: Once | INTRAMUSCULAR | Status: AC
  Administered 2015-02-04: 40 mg via INTRAVENOUS
  Filled 2015-02-04: qty 4

## 2015-02-04 MED ORDER — SODIUM CHLORIDE 0.9 % IV SOLN
Freq: Once | INTRAVENOUS | Status: AC
  Administered 2015-02-04: 13:00:00 via INTRAVENOUS

## 2015-02-04 NOTE — Progress Notes (Signed)
Asked again to see this pt  who had a hemorrhagic shock due to severe Grade 4 esophagitis on EGD 01/21/2015. Anticoagulant has been on hold since then but now need to be resumed  ( he has an IVC filter now). I will tentatively schedule for EGD tomorrow to confirm healing of esophageal ulcers before restarting anticoagulation.Hgb 9.6.

## 2015-02-04 NOTE — Progress Notes (Signed)
Speech Language Pathology Treatment: Dysphagia  Patient Details Name: [name] [name] MRN: [id number] DOB: Mar 10, 1941 Today's Date: 02/04/2015 Time: 2542-7062 SLP Time Calculation (min) (ACUTE ONLY): 18 min  Assessment / Plan / Recommendation Clinical Impression  SLP reinforced precautions, pt able to state 2/4 precautions, needed min verbal cues during intake to follow. Pt indicated visual reminders in room are helpful. Pt tolerated regular texture well, discussed moistening dry solids as needed.  Also provided instruction in Masako and Shaker exercises to be completed independently 10x each 3x a day. Daughter will provided reminders. Pt would benefit from f/u at SNF for strengthening exercises, no further acute needs. Will upgrade diet to regular and sign off.    HPI Other Pertinent Information: 74 y/o M admitted 5/8 after a syncopal episode and found to have acute pancreatitis. Also positive troponin/NSTEMI. EGD shows grade 4 esophagitis with deep ulcers, eschar, friability, blood clots in stomach. Intubated from 5/16 to 5/18. Pt reprotedly having difficulty swallowing dinner on 5/18.    Pertinent Vitals Pain Assessment: No/denies pain  SLP Plan  All goals met    Recommendations Diet recommendations: Regular;Thin liquid Liquids provided via: Cup;Straw Medication Administration: Whole meds with puree Supervision: Patient able to self feed;Intermittent supervision to cue for compensatory strategies Compensations: Small sips/bites;Slow rate;Multiple dry swallows after each bite/sip;Follow solids with liquid Postural Changes and/or Swallow Maneuvers: Seated upright 90 degrees;Upright 30-60 min after meal              Oral Care Recommendations: Oral care BID Follow up Recommendations: Skilled Nursing facility Plan: All goals met    GO    Herbie Baltimore, MA CCC-SLP [phone]  Lynann Beaver 02/04/2015, 3:02 PM

## 2015-02-04 NOTE — Progress Notes (Signed)
Nutrition Follow-up  DOCUMENTATION CODES:  Not applicable  INTERVENTION:  Ensure Enlive (each supplement provides 350kcal and 20 grams of protein)  NUTRITION DIAGNOSIS:  Inadequate oral intake related to altered GI function as evidenced by meal completion < 50%, ongoing  GOAL:  Patient will meet greater than or equal to 90% of their needs, progressing  MONITOR:  PO intake, Supplement acceptance, Labs, Weight trends, Skin, I & O's  ASSESSMENT: Admitted 5/8 after a syncopal episode and found to have acute pancreatitis. Also positive troponin/NSTEMI  Transferred to ICU with acute respiratory distress and intubated. EDG shows grade 4 esophagitis with deep ulcers, eschar, friability. OGT left out. No plans for feeding tube.   Patient continues on a Soft diet.  PO intake variable at 25-75% per flowsheet records.  Cardiology note 5/30 reviewed.  Plan is to proceed with CABG once medically appropriate.  Pt is receiving Ensure Enlive oral nutrition supplements.  Height:  Ht Readings from Last 1 Encounters:  01/19/15 6' (1.829 m)    Weight:  Wt Readings from Last 1 Encounters:  01/25/15 187 lb 13.3 oz (85.2 kg)    Ideal Body Weight:  80.9 kg  Wt Readings from Last 10 Encounters:  01/25/15 187 lb 13.3 oz (85.2 kg)    BMI:  Body mass index is 25.47 kg/(m^2).  Estimated Nutritional Needs:  Kcal:  2050-2250  Protein:  100-110 grams  Fluid:  2.0-2.2 L  Skin:  Reviewed, no issues  Diet Order:  DIET SOFT Room service appropriate?: Yes; Fluid consistency:: Thin; Fluid restriction:: 1200 mL Fluid  EDUCATION NEEDS:  No education needs identified at this time   Intake/Output Summary (Last 24 hours) at 02/04/15 1206 Last data filed at 02/04/15 1043  Gross per 24 hour  Intake    480 ml  Output   1352 ml  Net   -872 ml    Last BM:  Not documented  Maureen Chatters, RD, LDN Pager #: [phone] After-Hours Pager #: [phone]

## 2015-02-04 NOTE — Progress Notes (Addendum)
[facility] PROGRESS NOTE Interim History: 74 year old male who presented to [facility] hospital for syncopal episode and bradycardia which was later attribute to vasovagal response. Would have persisting epigastric pain with nausea. In the ED at [facility] a CT scan of the abdomen and pelvis show very stone lipase was greater than 300 LFTs were elevated lactic acid of 2.2 with a creatinine 1.7 she was transferred to  [facility] hospital. 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 CT scan of the abdomen and pelvis showed edema around the pancreas compatible with pancreatitis no abscess choledocholithiasis with stone in common bile duct and pleural effusion. Started having black stool and found to be in hemorrhagic shock and had to be transfused several unit of packed red blood cells. Transferred to the ICU for acute respiratory distress intubated and PCCM took over with a chest x-ray which revealed bilateral consolidation. An EGD was performed that showed a grade 4 esophagitis with deep ulcers and friable mucosa. Anticoagulants were held along with aspirin. He is only on Lovenox for DVT prophylaxis. Chest x-ray on 518 showed improving aeration and  was extubated. Lower extremity Doppler done on 01/24/2015 showed a bilateral lower extremity DVTs, IVC filter was placed.   Assessment/Plan: Hemorrhagic shock/acute blood loss anemia due to erosive esophagitis: -EGD done showed esophagitis, hemorraghic shock due to esophagitis plus anti-coagulation, now resolved status post 5 units of packed red blood cells. Keep hemoglobin closer to 9.  - Continue PPI twice a day there is no evidence of ongoing bleeding at this time. - We'll have to discuss with GI when to start  anticoagulation. No signs of overt bleeding.  Acute gallstone pancreatitis: - MRCP suggested the possibility of a 4 mm stone within the common bile duct. - GI was consulted which recommended laparoscopic cholecystectomy with intraoperative cholangiogram. - Surgery rec cholecystectomy as an outpatient. - He follow-up with surgery as an outpatient after surgery triple-vessel disease problem resolved.  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 - Continue Coreg. Further titration per cardiology. - His Cr cont to improved ? If he might benfeit from ACE-I. - Hemoglobin drop below 8 we'll go ahead and transfuse him a unit of packed red blood cells, will try to keep hemoglobin closer to 9. - Restrict his fluid intake monitor strict I's and O's.  Acute bilateral lower extremity DVT: - He is currently on only prophylactic Lovenox due to hemorrhagic shock.  - IVC filter was placed. Mild drop in hemoglobin, see above for further details. - will not be a candidate for anticoagulation due to GI bleeding.  Ileus: - Resolved with conservative management.  Acute kidney injury: - Renal ultrasound showed no acute findings.  - Now improving with diuresis.  Hypocalcemia: Corrected calcium is actually 9. No further interventions.  Persisting hypokalemia: Low again, will continue to replete  Hypophosphatemia: Has normalized with supplementation.  Syncopal episode most likely vasovagal: No further episodes.  Bradycardia: Felt to be vaguely mediated no resolved.  Thrombocytopenia  HIT panel negative - suspect due to acute inflam illness - no evidence of acute blood loss - platelet count has normalized  Hypotension >  hypertension Hypotension resolved with volume expansion - blood pressure controlled  DM Blood glucose well controlled continue Lantus plus sliding scale insulin.   Code Status: FULL Family Communication: No family present at time of exam today Disposition Plan: Insurance will not cover CIR - transfer to cardiac telemetry bed while awaiting CABG   Consultants:  [facility]  CVTS  Cardiology  Procedures: Significant Events: 5/08 Transfer from [place] after syncopal episode, gallstones, pancreatitis, elevated troponin I, abnormal echocardiogram 5/09 TTE: EF 30-40%, akinesis of basal-midinferior myocardium - severe hypokinesis lateral myocardium - grade 1 DD 5/12 LHC: severe 3 vessel disease  5/13 CT ABD/Pelvis: infiltration edema around pancreas, c/w acute pancreatitis, no abscess, cholelithiasis with stones in CBD, bilateral pleural effusions with compressive atx. 5/15 transfused one unit PRBCs 5/16 Transferred to ICU with acute respiratory distress. Intubated. PCCM consultation. CXR revealed BLL consolidation 5/16 transfused one unit PRBCs 5/16 EGD: grade 4 esophagitis with deep ulcers, eschar, friability, blood clllots in stomach, no gastric ulcers or mucosal abnormalities, mod edema of 2nd portion of duodenum c/w pancreatitis. PPI infusion initiated. Heparin disconitnued. OGT left out 5/17 transfused one unit PRBCs for Hgb 7.4 in setting of ACS 5/18 CXR with improved basilar aeration. Extubated 5/19 LE venous duplex scans + B LE DVT 5/19 IVC filter placed    Antibiotics: Zosyn 5/12 > 5/13 Vancomycin 5/16 > 5/19 Primaxin 5/16 > 5/25  HPI/Subjective: Feeling better, no shortness of breath or weakness with ambulation, no black stools or red stools.  Objective: Filed Vitals:   02/03/15 0536 02/03/15 1447 02/03/15 1955 02/04/15 0416  BP: 111/53 115/57 123/63 114/48  Pulse: 74 73 82 73  Temp: 98.3 F (36.8 C) 97.7 F (36.5 C) 98.3 F (36.8 C) 98.1 F (36.7 C)   TempSrc: Oral Oral Oral Oral  Resp: Height:      Weight:      SpO2: 98% 98% 99% 96%    Intake/Output Summary (Last 24 hours) at 02/04/15 0848 Last data filed at 02/04/15 0417  Gross per 24 hour  Intake    240 ml  Output    751 ml  Net   -511 ml   Filed Weights   01/23/15 0600 01/24/15 0500 01/25/15 0400  Weight: 89.7 kg (197 lb 12 oz) 86.7 kg (191 lb 2.2 oz) 85.2 kg (187 lb 13.3 oz)    Exam:  General: Alert, awake, oriented x3, in no acute distress.  HEENT: No bruits, no goiter.  Heart: Regular rate and rhythm.lower ext edema. Lungs: Good air movement, clear Abdomen: Soft, nontender, nondistended, positive bowel sounds.  Neuro: Grossly intact, nonfocal.   Data Reviewed: Basic Metabolic Panel:  Recent Labs Lab 01/29/15 0052 01/29/15 0103 01/31/15 0429 02/02/15 0002 02/04/15 0004  NA 144  --  142 143 143  K 4.1  --  3.8 3.7 3.3*  CL 116*  --  112* 110 110  CO2 20*  --  21* 22 25  GLUCOSE 218*  --  170* 152* 147*  BUN 27*  --  27* 23* 20  CREATININE 1.84*  --  1.69* 1.53* 1.35*  CALCIUM 6.8*  --  7.2* 7.1* 7.1*  PHOS  --  4.1  --   --   --    Liver Function Tests:  Recent Labs Lab 01/29/15 0052 01/31/15 0429  AST 63* 63*  ALT 57 47  ALKPHOS 63 78  BILITOT 1.1 1.0  PROT 5.1* 5.4*  ALBUMIN 1.4* 1.5*  No results for input(s): LIPASE, AMYLASE in the last 168 hours. No results for input(s): AMMONIA in the last 168 hours. CBC:  Recent Labs Lab 01/28/15 1825 01/29/15 0052 01/31/15 0429 02/02/15 0002 02/04/15 0004  WBC 12.7* 11.2* 10.2 8.2 6.2  HGB 9.3* 8.7* 9.4* 8.6* 7.9*  HCT 29.5* 27.6* 29.8* 26.5* 24.8*  MCV 91.6 92.0 92.0 89.2 89.9  PLT 216 201 197 177 163   Cardiac Enzymes: No results for input(s): CKTOTAL, CKMB, CKMBINDEX, TROPONINI in the last 168 hours. BNP (last 3 results) No results for input(s): BNP in the last 8760 hours.  ProBNP (last 3 results) No results for input(s): PROBNP in the last 8760  hours.  CBG:  Recent Labs Lab 02/02/15 2118 02/03/15 0607 02/03/15 1115 02/03/15 2114 02/04/15 0620  GLUCAP 165* 184* 252* 125* 126*    Recent Results (from the past 240 hour(s))  Urine culture     Status: None   Collection Time: 01/28/15  6:26 PM  Result Value Ref Range Status   Specimen Description URINE, CLEAN CATCH  Final   Special Requests Normal  Final   Colony Count NO GROWTH Performed at [facility]   Final   Culture NO GROWTH Performed at [facility]   Final   Report Status 01/29/2015 FINAL  Final  Clostridium Difficile by PCR     Status: None   Collection Time: 02/01/15  5:16 AM  Result Value Ref Range Status   C difficile by pcr NEGATIVE NEGATIVE Final     Studies: Dg Chest 2 View  02/03/2015   CLINICAL DATA:  Shortness of breath.  Follow-up exam.  EXAM: CHEST  2 VIEW  COMPARISON:  01/25/2015  FINDINGS: Lung aeration has significantly improved. There is persistent lung base opacity consistent with a combination of atelectasis and small pleural effusions. No evidence of residual pulmonary edema.  No pneumothorax.  Cardiac silhouette normal in size and configuration. Normal mediastinal and hilar contours.  Right internal jugular central venous line has been removed. No remaining indwelling support apparatus.  IMPRESSION: 1. There has been significant improvement since the previous chest radiograph. No evidence of residual pulmonary edema or pneumonia. 2. Mild lung base atelectasis and small pleural effusions persist.   Electronically Signed   By: Amie Portland M.D.   On: 02/03/2015 07:47    Scheduled Meds: . carvedilol  6.25 mg Oral BID WC  . enoxaparin (LOVENOX) injection  40 mg Subcutaneous Q24H  . feeding supplement (ENSURE ENLIVE)  237 mL Oral TID BM  . furosemide  40 mg Oral Daily  . insulin aspart  0-20 Units Subcutaneous TID WC  . insulin glargine  10 Units Subcutaneous BID  . latanoprost  1 drop Both Eyes QHS  . pantoprazole sodium  40  mg Oral BID  . potassium chloride  40 mEq Oral Once  . saccharomyces boulardii  250 mg Oral BID   Continuous Infusions:   Time Spent: 25 min   Marinda Elk  [facility] Pager [phone]. If 7PM-7AM, please contact night-coverage at [email or web address], password [facility] 02/04/2015, 8:48 AM  LOS: 22 days

## 2015-02-04 NOTE — Progress Notes (Signed)
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 Increased WOB and GIB with pt transferred to ICU and intubated 5/16 to 5/18. Resident requesting bed to chair only on 5/19 until cleared by cardiology for ambulation.    PT Comments    Pt continues to progress with ambulation distance, still limited with pace, cannot safely increase pace and needs RW for safety. Pt safe with transfers and ambulation when using RW, discussed ambulating on unit with family. Pt desiring to do this. PT will continue to follow.   Follow Up Recommendations  Supervision/Assistance - 24 hour;SNF     Equipment Recommendations  3in1 (PT)    Recommendations for Other Services OT consult     Precautions / Restrictions Precautions Precautions: Fall Precaution Comments: Fall risk greatly reduced with use of RW Restrictions Weight Bearing Restrictions: No    Mobility  Bed Mobility               General bed mobility comments: pt received in chair  Transfers Overall transfer level: Needs assistance Equipment used: Rolling walker (2 wheeled) Transfers: Sit to/from Stand Sit to Stand: Supervision         General transfer comment: pt stood with no physical assist, safe hand placement on chair, no balance loss  Ambulation/Gait Ambulation/Gait assistance: Supervision Ambulation Distance (Feet): 415 Feet Assistive device: Rolling walker (2 wheeled) Gait Pattern/deviations: Step-to pattern;Decreased stride length Gait velocity: decreased Gait velocity interpretation: <1.8 ft/sec, indicative of risk  for recurrent falls General Gait Details: pt continues with slow gait but no LOB, vc's for self monitoring, HR fluctuated between 86 and 96 bpm. Pt is NAD throughout ambulation, motivated to mobilize more   Information systems manager [place] (Stroke Patients Only)       Balance Overall balance assessment: Needs assistance Sitting-balance support: No upper extremity supported Sitting balance-Leahy Scale: Good     Standing balance support: Single extremity supported Standing balance-Leahy Scale: Poor Standing balance comment: pt with significant wt through RW in standing, has difficulty lifting even one hand from RW for O2 sat check.                     Cognition Arousal/Alertness: Awake/alert Behavior During Therapy: WFL for tasks assessed/performed Overall Cognitive Status: Within Functional Limits for tasks assessed                 General Comments: pt appropriate throughout session, used safe practice with RW without needing cues. Sometimes slightly slow to respond    Exercises      General Comments General comments (skin integrity, edema, etc.): Spoke with pt's RN, Gershon Cull, who agreed with letting pt ambulate in hall with family. Instructed him in ambulating short distance each hour and could ambulate longer distance 3-5x/ day.      Pertinent Vitals/Pain Pain Assessment: No/denies pain    Home Living                      Prior Function  PT Goals (current goals can now be found in the care plan section) Acute Rehab PT Goals Patient Stated Goal: to recover well enough to have his CABG surgery PT Goal Formulation: With patient Time For Goal Achievement: 02/11/15 Potential to Achieve Goals: Good Progress towards PT goals: Progressing toward goals    Frequency  Min 3X/week    PT Plan Current plan remains appropriate    Co-evaluation             End of Session Equipment Utilized During  Treatment: Gait belt Activity Tolerance: Patient tolerated treatment well Patient left: in chair;with call bell/phone within reach;with family/visitor present     Time: 0454-0981 PT Time Calculation (min) (ACUTE ONLY): 25 min  Charges:  $Gait Training: 23-37 mins                    G Codes:     Lyanne Co, PT  Acute Rehab Services  [phone]  Lyanne Co 02/04/2015, 12:32 PM

## 2015-02-04 NOTE — Progress Notes (Signed)
SUBJECTIVE: The patient appears to be getting a little bit stronger each day. However he continues to have complex medical problems. His overall cardiac status today is stable. Renal function continues to improve slowly. Potassium was low today. I have ordered a one time replacement dose potassium.  Filed Vitals:   02/03/15 0536 02/03/15 1447 02/03/15 1955 02/04/15 0416  BP: 111/53 115/57 123/63 114/48  Pulse: 74 73 82 73  Temp: 98.3 F (36.8 C) 97.7 F (36.5 C) 98.3 F (36.8 C) 98.1 F (36.7 C)  TempSrc: Oral Oral Oral Oral  Resp: 18 18 18 16   Height:      Weight:      SpO2: 98% 98% 99% 96%     Intake/Output Summary (Last 24 hours) at 02/04/15 0818 Last data filed at 02/04/15 0417  Gross per 24 hour  Intake    360 ml  Output    751 ml  Net   -391 ml    LABS: Basic Metabolic Panel:  Recent Labs  50/09/38 0002 02/04/15 0004  NA 143 143  K 3.7 3.3*  CL 110 110  CO2 22 25  GLUCOSE 152* 147*  BUN 23* 20  CREATININE 1.53* 1.35*  CALCIUM 7.1* 7.1*   Liver Function Tests: No results for input(s): AST, ALT, ALKPHOS, BILITOT, PROT, ALBUMIN in the last 72 hours. No results for input(s): LIPASE, AMYLASE in the last 72 hours. CBC:  Recent Labs  02/02/15 0002 02/04/15 0004  WBC 8.2 6.2  HGB 8.6* 7.9*  HCT 26.5* 24.8*  MCV 89.2 89.9  PLT 177 163   Cardiac Enzymes: No results for input(s): CKTOTAL, CKMB, CKMBINDEX, TROPONINI in the last 72 hours. BNP: Invalid input(s): POCBNP D-Dimer: No results for input(s): DDIMER in the last 72 hours. Hemoglobin A1C: No results for input(s): HGBA1C in the last 72 hours. Fasting Lipid Panel: No results for input(s): CHOL, HDL, LDLCALC, TRIG, CHOLHDL, LDLDIRECT in the last 72 hours. Thyroid Function Tests: No results for input(s): TSH, T4TOTAL, T3FREE, THYROIDAB in the last 72 hours.  Invalid input(s): FREET3  RADIOLOGY: Ct Abdomen Pelvis Wo Contrast  01/18/2015   CLINICAL DATA:  Nausea and vomiting after cardiac  catheterization yesterday.  EXAM: CT ABDOMEN AND PELVIS WITHOUT CONTRAST  TECHNIQUE: Multidetector CT imaging of the abdomen and pelvis was performed following the standard protocol without IV contrast.  COMPARISON:  Abdomen 01/17/2015  FINDINGS: Bilateral pleural effusions with basilar atelectasis. Coronary artery calcifications. Residual contrast material in the esophagus may represent reflux or dysmotility.  Cholelithiasis with multiple small stones in the gallbladder. No gallbladder wall thickening. Stones are present in the common bile duct. No significant bile duct dilatation. There is diffuse inflammatory infiltration and edema in the fat around the pancreas consistent with acute pancreatitis. Calcifications in the head of the pancreas may represent stones or postinflammatory calcifications. No loculated fluid collection is demonstrated to suggest abscess.  The unenhanced appearance of the liver, spleen, adrenal glands, inferior vena cava, and retroperitoneal lymph nodes is unremarkable. Calcification of the aorta without aneurysm. Bilateral low-attenuation renal lesions likely representing cyst but too small to characterize. No hydronephrosis in either stomach, small bowel, and colon are not abnormally distended. There is suggestion of wall thickening in some of the small bowel loops possibly indicating enteritis. Kidney. Small amount of free fluid in the abdomen and pelvis. No free air.  Pelvis: Appendix is normal. Bladder wall is not thickened. Prostate gland is not enlarged. No pelvic mass or lymphadenopathy. Degenerative changes in the spine.  No destructive bone lesions. Diffuse subcutaneous soft tissue edema.  IMPRESSION: Infiltration edema around the pancreas with fluid in the pericolic gutters and pelvis. Changes are consistent with acute pancreatitis. No abscess identified. Cholelithiasis with stones appearing in the common bile duct. No bile duct dilatation. Bilateral pleural effusions with basilar  atelectasis.   Electronically Signed   By: Burman Nieves M.D.   On: 01/18/2015 03:22   Dg Chest 2 View  02/03/2015   CLINICAL DATA:  Shortness of breath.  Follow-up exam.  EXAM: CHEST  2 VIEW  COMPARISON:  01/25/2015  FINDINGS: Lung aeration has significantly improved. There is persistent lung base opacity consistent with a combination of atelectasis and small pleural effusions. No evidence of residual pulmonary edema.  No pneumothorax.  Cardiac silhouette normal in size and configuration. Normal mediastinal and hilar contours.  Right internal jugular central venous line has been removed. No remaining indwelling support apparatus.  IMPRESSION: 1. There has been significant improvement since the previous chest radiograph. No evidence of residual pulmonary edema or pneumonia. 2. Mild lung base atelectasis and small pleural effusions persist.   Electronically Signed   By: Amie Portland M.D.   On: 02/03/2015 07:47   Dg Abd 1 View  01/15/2015   CLINICAL DATA:  Ileus  EXAM: ABDOMEN - 1 VIEW  COMPARISON:  None.  FINDINGS: Scattered large and small bowel gas is noted. Mildly dilated loops of small bowel are to in the mid abdomen. No free air is noted. No definitive obstructive changes are seen.  IMPRESSION: Mild dilatation of small bowel likely representing a small-bowel ileus. Correlation with the physical exam is recommended.   Electronically Signed   By: Alcide Clever M.D.   On: 01/15/2015 09:18   Ir Ivc Filter Plmt / S&i /img Guid/mod Sed  01/24/2015   CLINICAL DATA:  Acute DVT. GI bleed, a relative contraindication to anticoagulation. Caval filtration is requested. Renal insufficiency. Indwelling right IJ triple-lumen catheter.  EXAM: INFERIOR VENACAVOGRAM  IVC FILTER PLACEMENT UNDER FLUOROSCOPY  FLUOROSCOPY TIME:  30 seconds, 35.6 mGy  TECHNIQUE: Patency of the left IJ vein was confirmed with ultrasound with image documentation. An appropriate skin site was determined. Skin site was marked, prepped with  chlorhexidine, and draped using maximum barrier technique. The region was infiltrated locally with 1% lidocaine.  Intravenous Fentanyl and Versed were administered as conscious sedation during continuous cardiorespiratory monitoring by the radiology RN, with a total moderate sedation time of less than 30 minutes.  Under real-time ultrasound guidance, the left IJ vein was accessed with a 21 gauge micropuncture needle; the needle tip within the vein was confirmed with ultrasound image documentation. The needle was exchanged over a 018 guidewire for a transitional dilator, which allow advancement of the [facility] wire into the IVC. A long 6 French vascular sheath was placed for inferior venacavography using CO2. This demonstrated no caval thrombus. Renal vein inflows were evident.  The [facility] IVC filter was advanced through the sheath and successfully deployed under fluoroscopy at the L2 level. Followup CO2 cavagram demonstrates stable filter position and no evident complication. The sheath was removed and hemostasis achieved at the site. No immediate complication.  IMPRESSION: 1. Normal IVC. No thrombus or significant anatomic variation. 2. Technically successful infrarenal IVC filter placement. This is a retrievable model.   Electronically Signed   By: Corlis Leak M.D.   On: 01/24/2015 17:44   US Renal  01/16/2015   CLINICAL DATA:  Acute renal failure  EXAM: RENAL / URINARY  TRACT ULTRASOUND COMPLETE  COMPARISON:  None.  FINDINGS: Right Kidney:  Length: 11.4 cm. Echogenicity within normal limits. No mass or hydronephrosis visualized.  Left Kidney:  Length: 11.9 cm. Echogenicity within normal limits. No mass or hydronephrosis visualized.  Bladder:  Appears normal for degree of bladder distention.  Small amount of ascites.  IMPRESSION: Normal renal ultrasound.   Electronically Signed   By: Amie Portland M.D.   On: 01/16/2015 15:58   Mr Abdomen Mrcp Wo Cm  01/16/2015   CLINICAL DATA:  Cholelithiasis with abdominal  pain.  EXAM: MRI ABDOMEN WITHOUT CONTRAST  (INCLUDING MRCP)  TECHNIQUE: Multiplanar multisequence MR imaging of the abdomen was performed. Heavily T2-weighted images of the biliary and pancreatic ducts were obtained, and three-dimensional MRCP images were rendered by post processing.  COMPARISON:  None.  FINDINGS: Exam is limited due to patient motion. Patient had difficult tolerating the length of the exam as well as difficulty breath holding.  Lower chest:  There is dense bibasilar atelectasis.  Hepatobiliary: There is no intrahepatic biliary duct dilatation. No discrete hepatic lesion on this noncontrast exam. There is loss of signal intensity on the opposed phase imaging consistent with hepatic steatosis.  There are several small gallstones layering dependently within the lumen of the gallbladder. These measure approximately 6. There is no gallbladder inflammation evident. Several stones appear to be with in the neck of the gallbladder (image 16, series 3).  The common bile duct and common hepatic duct are not dilated. There is a filling defect within the common bile duct on image 17, series 3. This is small measuring approximately 4 mm. This appears to be replicated on the heavily T2 weighted series 20 on image 3.  Pancreas: There is extensive inflammation involving the head body and tail of the pancreas suggesting acute pancreatitis. Pancreatic duct is difficult to visualize on the sequences due to respiratory motion but does not appear dilated. There is fluid along the anterior perirenal fascia left and right. Small amount fluid around the liver and spleen. No organized fluid collections are evident.  Spleen: Normal spleen.  Adrenals/urinary tract: There several renal lesions have high signal intensity T2 weighted imaging suggesting renal cysts. No hydronephrosis.  Stomach/Bowel: Stomach and limited of the small bowel is unremarkable  Vascular/Lymphatic: Abdominal aortic normal caliber. No retroperitoneal  periportal lymphadenopathy.  Musculoskeletal: No aggressive osseous lesion  IMPRESSION: 1. Acute pancreatitis. No organized fluid collections. Enhancement pattern of pancreas cannot be determined. 2. Cholelithiasis without evidence of acute cholecystitis. 3. Potential small 4 mm filling defect within the mid common bile duct. Diagnostic confidence is limited due to patient motion and breathing. No common bile duct dilatation or biliary duct dilatation. 4. Hepatic steatosis. 5. Dense bibasilar atelectasis .   Electronically Signed   By: Genevive Bi M.D.   On: 01/16/2015 08:25   Dg Chest Port 1 View  01/25/2015   CLINICAL DATA:  Respiratory failure.  EXAM: PORTABLE CHEST - 1 VIEW  COMPARISON:  01/24/2015 .  FINDINGS: Right IJ line stable position. Stable cardiomegaly. Diffuse pulmonary alveolar infiltrates in pleural effusions again noted. No interim change . No pneumothorax. No acute bony abnormality P  IMPRESSION: 1. Right IJ line stable position. 2. Persistent cardiomegaly with bilateral pulmonary alveolar infiltrates and pleural effusions consistent with congestive heart failure. No interim change. Superimposed pneumonia cannot be excluded.   Electronically Signed   By: Maisie Fus  Register   On: 01/25/2015 07:22   Dg Chest Port 1 View  01/24/2015   CLINICAL  DATA:  Respiratory failure.  EXAM: PORTABLE CHEST - 1 VIEW  COMPARISON:  01/23/2015 .  FINDINGS: Interim extubation. Right IJ line in stable position. Mediastinum and hilar structures stable. Cardiomegaly. Bilateral pulmonary alveolar infiltrates and pleural effusions. These findings are consistent with congestive heart failure. Bilateral pneumonia cannot be excluded. No pneumothorax.  IMPRESSION: 1. Interim removal of endotracheal tube. Right IJ line in stable position. 2. Persistent cardiomegaly with progressed bilateral prominent pulmonary alveolar infiltrates and pleural effusions consistent with congestive heart failure. Bilateral pneumonia cannot  be excluded.   Electronically Signed   By: Maisie Fus  Register   On: 01/24/2015 07:31   Dg Chest Port 1 View  01/23/2015   CLINICAL DATA:  Respiratory failure.  EXAM: PORTABLE CHEST - 1 VIEW  COMPARISON:  01/22/2015.  FINDINGS: Endotracheal tube and IJ line in stable position. Mediastinum and hilar structures are normal. Stable cardiomegaly and pulmonary vascularity. Persistent bibasilar pulmonary infiltrates and pleural effusions. These findings could be related congestive heart failure and/or bilateral pneumonia. No pneumothorax.  IMPRESSION: 1. Endotracheal tube and right IJ line in stable position. 2. Persistent bilateral basilar pulmonary alveolar infiltrates with small pleural effusions. These changes could be related to congestive heart failure and/or bilateral pneumonia. No significant interim change.   Electronically Signed   By: Maisie Fus  Register   On: 01/23/2015 07:10   Dg Chest Port 1 View  01/22/2015   CLINICAL DATA:  Central line placement  EXAM: PORTABLE CHEST - 1 VIEW  COMPARISON:  Portable exam 2037 hours compared to 01/22/2015  FINDINGS: Tip of endotracheal tube projects approximately 4.1 cm above carina.  Interval removal of LEFT jugular line.  New RIGHT jugular central venous catheter with tip projecting over SVC.  Costophrenic angles excluded, repeat image declined by ordering physician.  Bibasilar effusions and atelectasis.  Upper lungs clear.  No pneumothorax.  IMPRESSION: No pneumothorax following RIGHT jugular line placement.  Persistent bibasilar effusions and atelectasis.   Electronically Signed   By: Ulyses Southward M.D.   On: 01/22/2015 21:13   Dg Chest Port 1 View  01/22/2015   CLINICAL DATA:  Shortness of breath.  Intubation.  EXAM: PORTABLE CHEST - 1 VIEW  COMPARISON:  01/21/2015.  FINDINGS: Endotracheal tube 2.1 cm above the carina. Left IJ line in stable position. Interim removal of NG tube. Stable cardiomegaly with bilateral pulmonary alveolar infiltrates and pleural effusions  consistent with congestive heart failure. No pneumothorax.  IMPRESSION: 1. Endotracheal tube 2.1 cm above the carina. Left IJ line in stable position Interim removal of NG tube. 2. Persistent changes of congestive heart failure with bilateral pulmonary edema and pleural effusions.   Electronically Signed   By: Maisie Fus  Register   On: 01/22/2015 07:21   Dg Chest Port 1 View  01/21/2015   CLINICAL DATA:  ET tube placement.  EXAM: PORTABLE CHEST - 1 VIEW  COMPARISON:  01/21/2015  FINDINGS: Endotracheal tube is slightly above the carina, approximately 15 mm, directed toward the right mainstem bronchus. Left central line tip is in the SVC. No pneumothorax. Bilateral lower lobe airspace opacities. Heart is normal size. Possible small layering effusions. NG tube enters the stomach. No acute bony abnormality.  IMPRESSION: Endotracheal tube approximately 15 mm above the carina, directed toward the right mainstem bronchus. This could be retracted 1-2 cm for optimal positioning.  Bibasilar atelectasis or infiltrates. Question small layering effusions.   Electronically Signed   By: Charlett Nose M.D.   On: 01/21/2015 11:57   Dg Chest [facility]  01/21/2015   CLINICAL DATA:  Lethargy, shortness of breath, epigastric pain  EXAM: PORTABLE CHEST - 1 VIEW  COMPARISON:  01/19/2015  FINDINGS: Cardiomediastinal silhouette is stable. Persistent bilateral small pleural effusion with bilateral basilar atelectasis or infiltrate. No convincing pulmonary edema.  IMPRESSION: Persistent bilateral small pleural effusion with bilateral basilar atelectasis or infiltrate. No pulmonary edema.   Electronically Signed   By: Natasha Mead M.D.   On: 01/21/2015 08:57   Dg Chest Port 1 View  01/19/2015   CLINICAL DATA:  Chest congestionPr does have a NG tube placed currently Hx of HTN, DM, cholelithiasis  EXAM: PORTABLE CHEST - 1 VIEW  COMPARISON:  01/18/2015.  FINDINGS: Bibasilar lung opacity is stable, obscuring both hemidiaphragms. This is  consistent with combination of moderate pleural effusions with lung base atelectasis, pneumonia or a combination. There is no pulmonary edema. Remainder of the lungs is clear.  No pneumothorax.  Cardiac silhouette is normal in size. No mediastinal or hilar masses.  Nasogastric tube passes below the diaphragm, stable.  IMPRESSION: 1. No significant change from the previous day's study. 2. Persistent bilateral lung base opacity consistent with a combination of moderate pleural effusions with atelectasis, pneumonia or a combination. No pulmonary edema. No pneumothorax.   Electronically Signed   By: Amie Portland M.D.   On: 01/19/2015 09:06   Dg Chest Port 1 View  01/18/2015   CLINICAL DATA:  Shortness of breath.  EXAM: PORTABLE CHEST - 1 VIEW  COMPARISON:  None.  FINDINGS: NG tube noted with tip below left hemidiaphragm. Mediastinum and hilar structures are normal. Heart size normal. Bilateral pulmonary infiltrates and pleural effusions noted. No pneumothorax. No acute bony abnormality .  IMPRESSION: 1. NG tube noted with tip below left hemidiaphragm. 2. Bilateral prominent basilar pulmonary infiltrates. These changes could be related to pulmonary edema and/or pneumonia. Small bilateral pleural effusions.   Electronically Signed   By: Maisie Fus  Register   On: 01/18/2015 07:25   Dg Abd Portable 1v  01/23/2015   CLINICAL DATA:  Followup adynamic ileus.  EXAM: PORTABLE ABDOMEN - 1 VIEW  COMPARISON:  CT, 01/18/2015  FINDINGS: Normal bowel gas pattern. No evidence of bowel obstruction or significant adynamic ileus.  A round density projects in the right mid abdomen. This could reflect a gallstone. There other densities are upper quadrant consistent with the known gallstones.  Mild vascular calcifications.  Soft tissues otherwise unremarkable.  IMPRESSION: 1. No evidence of bowel obstruction. No evidence of a significant residual adynamic ileus. No acute finding.   Electronically Signed   By: Amie Portland M.D.   On:  01/23/2015 12:48   Dg Abd Portable 1v  01/17/2015   CLINICAL DATA:  Abdominal pain for 3 days, vomiting for 1 day  EXAM: PORTABLE ABDOMEN - 1 VIEW  COMPARISON:  01/15/2015  FINDINGS: The bowel gas pattern is normal. No radio-opaque calculi or other significant radiographic abnormality are seen. Some colonic gas is noted in right colon and mid transverse colon without significant distension  IMPRESSION: Negative.   Electronically Signed   By: Natasha Mead M.D.   On: 01/17/2015 19:51   Dg Swallowing Func-speech Pathology  01/24/2015    Objective Swallowing Evaluation:   MBS Patient Details  Name: [name] [name] MRN: [id number] Date of Birth: 31-Dec-1940  Today's Date: 01/24/2015 Time: SLP Start Time (ACUTE ONLY): 1315-SLP Stop Time (ACUTE ONLY): 1340 SLP Time Calculation (min) (ACUTE ONLY): 25 min  Past Medical History:  Past Medical History  Diagnosis Date  .  Hypertension   . Diabetes mellitus type 2 in obese   . Cholelithiasis    Past Surgical History:  Past Surgical History  Procedure Laterality Date  . Cardiac catheterization N/A 01/17/2015    Procedure: Left Heart Cath and Coronary Angiography;  Surgeon: Lyn Records, MD;  Location: [facility] INVASIVE CV LAB;  Service: Cardiovascular;   Laterality: N/A;  . Esophagogastroduodenoscopy N/A 01/21/2015    Procedure: ESOPHAGOGASTRODUODENOSCOPY (EGD);  Surgeon: Hilarie Fredrickson, MD;   Location: [facility] ENDOSCOPY;  Service: Endoscopy;  Laterality: N/A;  to be  done at BEDSIDE   HPI:  Other Pertinent Information: 74 y/o M admitted 5/8 after a syncopal  episode and found to have acute pancreatitis. Also positive  troponin/NSTEMI. EGD shows grade 4 esophagitis with deep ulcers, eschar,  friability, blood clots in stomach. Intubated from 5/16 to 5/18. Pt  reprotedly having difficulty swallowing dinner on 5/18.   No Data Recorded  Assessment / Plan / Recommendation CHL IP CLINICAL IMPRESSIONS 01/24/2015  Therapy Diagnosis Moderate oral phase dysphagia;Moderate pharyngeal phase   dysphagia;Moderate cervical esophageal phase dysphagia  Clinical Impression MBS complete. Overt s/s of aspiration not seen during  exam this afternoon as noted on bedside eval this am.  Patient does  present with what appears to be a multifactorial dysphagia with laryngeal  and pharyngeal weakness and possible esophageal component, resulting  decreased pharyngeal squeeze, decreased relaxation of the UES, and  moderate pharyngeal residuals post swallow, greater with solids vs  liquids. Despite above however, patient fully protecting airway during  study. Should be notes that patient consuming solids and liquids via very  small boluses (less than 1/2 tsp) despite max clinician encouragement for  larger size; ? cognitive status contributing as patient also presents with  decreased sustained attention to task. Aspiration risk remains moderately  high given degree of residuals without patient awareness or ability to  clear within a reasonable time frame. Will initiate a po diet and f/u  closely for tolerance and potential to advance.       CHL IP TREATMENT RECOMMENDATION 01/24/2015  Treatment Recommendations Therapy as outlined in treatment plan below     CHL IP DIET RECOMMENDATION 01/24/2015  SLP Diet Recommendations Dysphagia 1 (Puree);Thin  Liquid Administration via (None)  Medication Administration Crushed with puree  Compensations Small sips/bites;Slow rate;Multiple dry swallows after each  bite/sip;Follow solids with liquid  Postural Changes and/or Swallow Maneuvers (None)     CHL IP OTHER RECOMMENDATIONS 01/24/2015  Recommended Consults (None)  Oral Care Recommendations Oral care BID  Other Recommendations (None)     No flowsheet data found.   CHL IP FREQUENCY AND DURATION 01/24/2015  Speech Therapy Frequency (ACUTE ONLY) min 2x/week  Treatment Duration 1 week         SLP Swallow Goals No flowsheet data found.  No flowsheet data found.    CHL IP REASON FOR REFERRAL 01/24/2015  Reason for Referral Objectively evaluate  swallowing function     Ferdinand Lango MA, CCC-SLP [phone]               Ferdinand Lango Meryl 01/24/2015, 1:52 PM     PHYSICAL EXAM  patient is oriented to person time and place. Affect is normal. His son-in-law is in the room. Lungs are clear. Respiratory effort is nonlabored. Cardiac exam reveals an S1 and S2. The abdomen is soft. He has peripheral edema. This is related to his low albumen.   TELEMETRY: I have reviewed telemetry today Feb 04, 2015. There is  normal sinus rhythm.   ASSESSMENT AND PLAN:    Acute gallstone pancreatitis    He continues to improve.    Bradycardia    His rate has been stable.    AKI (acute kidney injury)    BUN and creatinine continued to improve slowly. He tolerates low dose diuretics. ACE inhibitors have been on hold because of his renal insufficiency. When his renal function has normalized, and ACE inhibitor can be tried.    Acute systolic congestive heart failure     His volume status is stable. He is on a small dose of an oral diuretics. He has edema. However this is related to his low albumen. No change in therapy.    CAD, multiple vessel: CTO of RCA, Cx with severe prox LAD disease      The plan is to proceed with CABG. However the timing is dependent on his full medical recovery.   Anemia  Hemoglobin today is lower than it has been. This will be followed carefully by the primary team.    DVT (deep venous thrombosis)    The patient has an IVC filter in place. He is on Lovenox. At some point he will need to be changed one oral medication. The timing of this will be dependent on his anemia and further input from GI concerning the stability of his GI status.      Ischemic cardiomyopathy    He is on diarrhetic and a beta blocker. ACE inhibitors are being held because of his renal function. We have not started hydralazine and nitrates. I'm hopeful that when his renal function reaches normal baseline, that an ACE inhibitor can be tried.  This is a very  complex medical patient. We need to continue to treat him in the hospital until we are sure that all of his medical problems are stable. We will continue to look for input from the cardiac surgeons concerning potential timing of his bypass surgery.   Willa Rough 02/04/2015 8:18 AM

## 2015-02-05 ENCOUNTER — Encounter (HOSPITAL_COMMUNITY)
Admission: AD | Disposition: A | Discharge status: Home Health | Payer: Self-pay | Source: Other Acute Inpatient Hospital | Attending: Internal Medicine

## 2015-02-05 ENCOUNTER — Encounter (HOSPITAL_COMMUNITY): Payer: Self-pay | Admitting: *Deleted

## 2015-02-05 DIAGNOSIS — I82401 Acute embolism and thrombosis of unspecified deep veins of right lower extremity: Secondary | ICD-10-CM

## 2015-02-05 DIAGNOSIS — I214 Non-ST elevation (NSTEMI) myocardial infarction: Secondary | ICD-10-CM

## 2015-02-05 DIAGNOSIS — E118 Type 2 diabetes mellitus with unspecified complications: Secondary | ICD-10-CM

## 2015-02-05 DIAGNOSIS — N179 Acute kidney failure, unspecified: Secondary | ICD-10-CM

## 2015-02-05 DIAGNOSIS — R579 Shock, unspecified: Secondary | ICD-10-CM

## 2015-02-05 DIAGNOSIS — E1165 Type 2 diabetes mellitus with hyperglycemia: Secondary | ICD-10-CM

## 2015-02-05 HISTORY — PX: ESOPHAGOGASTRODUODENOSCOPY: SHX5428

## 2015-02-05 LAB — TYPE AND SCREEN
ABO/RH(D): A POS
Antibody Screen: NEGATIVE
UNIT DIVISION: 0

## 2015-02-05 LAB — BASIC METABOLIC PANEL
Anion gap: 8 (ref 5–15)
BUN: 20 mg/dL (ref 6–20)
CO2: 25 mmol/L (ref 22–32)
Calcium: 7.2 mg/dL — ABNORMAL LOW (ref 8.9–10.3)
Chloride: 107 mmol/L (ref 101–111)
Creatinine, Ser: 1.29 mg/dL — ABNORMAL HIGH (ref 0.61–1.24)
GFR calc Af Amer: >60 mL/min (ref >60)
GFR calc non Af Amer: 54 mL/min — ABNORMAL LOW (ref >60)
GLUCOSE: 183 mg/dL — AB (ref 65–99)
POTASSIUM: 4.4 mmol/L (ref 3.5–5.1)
Sodium: 140 mmol/L (ref 135–145)

## 2015-02-05 LAB — GLUCOSE, CAPILLARY
GLUCOSE-CAPILLARY: 242 mg/dL — AB (ref 65–99)
GLUCOSE-CAPILLARY: 168 mg/dL — AB (ref 65–99)
Glucose-Capillary: 212 mg/dL — ABNORMAL HIGH (ref 65–99)
Glucose-Capillary: 147 mg/dL — ABNORMAL HIGH (ref 65–99)
Glucose-Capillary: 177 mg/dL — ABNORMAL HIGH (ref 65–99)

## 2015-02-05 SURGERY — EGD (ESOPHAGOGASTRODUODENOSCOPY)
Anesthesia: Moderate Sedation

## 2015-02-05 MED ORDER — MIDAZOLAM HCL 10 MG/2ML IJ SOLN
INTRAMUSCULAR | Status: DC | PRN
  Administered 2015-02-05 (×2): 2 mg via INTRAVENOUS
  Administered 2015-02-05: 1 mg via INTRAVENOUS

## 2015-02-05 MED ORDER — BUTAMBEN-TETRACAINE-BENZOCAINE 2-2-14 % EX AERO
INHALATION_SPRAY | CUTANEOUS | Status: DC | PRN
  Administered 2015-02-05: 2 via TOPICAL

## 2015-02-05 MED ORDER — MIDAZOLAM HCL 5 MG/ML IJ SOLN
INTRAMUSCULAR | Status: AC
  Filled 2015-02-05: qty 2

## 2015-02-05 MED ORDER — FENTANYL CITRATE (PF) 100 MCG/2ML IJ SOLN
INTRAMUSCULAR | Status: DC | PRN
  Administered 2015-02-05 (×2): 25 ug via INTRAVENOUS

## 2015-02-05 MED ORDER — SODIUM CHLORIDE 0.9 % IV SOLN
INTRAVENOUS | Status: DC
  Administered 2015-02-05: 500 mL via INTRAVENOUS

## 2015-02-05 MED ORDER — SUCRALFATE 1 GM/10ML PO SUSP
1.0000 g | Freq: Four times a day (QID) | ORAL | Status: DC
Start: 2015-02-05 — End: 2015-02-20
  Administered 2015-02-05 – 2015-02-19 (×50): 1 g via ORAL
  Filled 2015-02-05 (×66): qty 10

## 2015-02-05 MED ORDER — FENTANYL CITRATE (PF) 100 MCG/2ML IJ SOLN
INTRAMUSCULAR | Status: AC
  Filled 2015-02-05: qty 2

## 2015-02-05 NOTE — Progress Notes (Signed)
Subjective: Patient just back from endoscopy. He denies chest pain or shortness of breath. States he feels fine.  Objective: Vital signs in last 24 hours: Temp:  [97.5 F (36.4 C)-99 F (37.2 C)] 97.5 F (36.4 C) (05/31 1045) Pulse Rate:  [67-85] 68 (05/31 1033) Resp:  [16-27] 24 (05/31 1033) BP: (93-132)/(45-104) 93/47 mmHg (05/31 1045) SpO2:  [95 %-100 %] 100 % (05/31 1033) Weight:  [179 lb 3.7 oz (81.3 kg)] 179 lb 3.7 oz (81.3 kg) (05/31 0401) Last BM Date: 02/04/15  Intake/Output from previous day: 05/30 0701 - 05/31 0700 In: 1303 [P.O.:957; Blood:346] Out: 1711 [Urine:1710; Stool:1] Intake/Output this shift:    Medications Scheduled Meds: . carvedilol  6.25 mg Oral BID WC  . enoxaparin (LOVENOX) injection  40 mg Subcutaneous Q24H  . feeding supplement (ENSURE ENLIVE)  237 mL Oral TID BM  . furosemide  40 mg Oral Daily  . insulin aspart  0-20 Units Subcutaneous TID WC  . insulin glargine  10 Units Subcutaneous BID  . latanoprost  1 drop Both Eyes QHS  . pantoprazole sodium  40 mg Oral BID  . saccharomyces boulardii  250 mg Oral BID  . sucralfate  1 g Oral 4 times per day   Continuous Infusions: . sodium chloride 500 mL (02/05/15 0942)   PRN Meds:.acetaminophen, bisacodyl, fentaNYL (SUBLIMAZE) injection, ondansetron (ZOFRAN) IV  PE: Blood pressure 111/47, heart rate 68, respiratory rate 15, oxygen saturation 100% Pt is alert and oriented, chronically ill-appearing male in NAD HEENT: normal Neck: JVP - normal Lungs: CTA bilaterally CV: RRR without murmur or gallop Abd: soft, NT, Positive BS Ext: 2+ soft pitting edema Skin: warm/dry no rash  Lab Results:   Recent Labs  02/04/15 0004 02/04/15 1725  WBC 6.2 6.8  HGB 7.9* 9.6*  HCT 24.8* 29.7*  PLT 163 181   BMET  Recent Labs  02/04/15 0004 02/05/15 0345  NA 143 140  K 3.3* 4.4  CL 110 107  CO2 25 25  GLUCOSE 147* 183*  BUN 20 20  CREATININE 1.35* 1.29*  CALCIUM 7.1* 7.2*    Assessment/Plan  Acute gallstone pancreatitis He continues to improve.  Hemorrhagic shock due to erosive esophagitis  EGD today - findings and recommendations reviewed     Bradycardia His rate has been stable. On coreg 6.25 BID   AKI (acute kidney injury) BUN and creatinine continued to improve slowly. He tolerates lasix  PO daily. ACE inhibitors have been on hold because of his renal insufficiency. When his renal function has normalized, and ACE inhibitor can be tried.   Acute systolic congestive heart failure His volume status is stable. He is on a small dose of an oral diuretics. He has edema. However this is related to his low albumen. No change in therapy.   CAD, multiple vessel: CTO of RCA, Cx with severe prox LAD disease The plan is to proceed with CABG.  Anemia Hemoglobin improve from 7.9 to 9.6 after one unit PRBCs. This will be followed carefully by the primary team. EGD today.  DVT (deep venous thrombosis) The patient has an IVC filter in place. He is on Lovenox. At some point he will need to be changed to oral medication. The timing of this will be dependent on his anemia and further input from GI concerning the stability of his GI status.    Ischemic cardiomyopathy, EF 30-35% He is on diuretic and a beta blocker. ACE inhibitors are being held because of his renal function. We  have not started hydralazine and nitrates as he has been hypotensive.  When his renal function reaches normal baseline, consider adding ACE inhibitor.    LOS: 23 days    HAGER, BRYAN PA-C 02/05/2015 10:48 AM  Patient seen, examined. Available data reviewed. Agree with findings, assessment, and plan as outlined by Wilburt Finlay, PA-C. The patient is independently interviewed and examined with exam findings as above. I have made changes were necessary. The patient is just back from endoscopy and I have reviewed Dr. Regino Schultze report. She recommends no anticoagulation for at least 2  weeks. Plans noted for CABG once he is medically improved. His nutritional status is a problem. I have reviewed his blood pressures, and he continues to have periods of blood pressures in the 90s over 40s. Considering his poor nutritional state and low blood pressure, I think an ACE inhibitor is a bad idea. I think his risk of acute kidney injury is much higher than the potential benefit. Would continue a combination of carvedilol and low-dose Lasix as you are currently doing. Will continue to follow, but I do not anticipate any major changes as he awaits CABG.  Tonny Bollman, M.D. 02/05/2015 11:59 AM

## 2015-02-05 NOTE — Care Management (Signed)
Medicare Important Message given? Yes (If Response is "NO", the following Medicare IM given date fields will be blank) Date Medicare IM given: 02/05/15 Medicare IM given by: Genella Bas 

## 2015-02-05 NOTE — Progress Notes (Signed)
CARDIAC REHAB PHASE I   Attempted to ambulate with pt x 3 today. First attempt pt working with OT, second attempt pt in endoscopy, third attempt pt ambulating in hallway with family. Pt ambulated 300 ft with daughter, using rolling walker.  Pt denies any needs at this time. Will follow-up tomorrow.    Joylene Grapes, RN, BSN 02/05/2015 1:14 PM

## 2015-02-05 NOTE — Interval H&P Note (Signed)
History and Physical Interval Note:  02/05/2015 10:12 AM  [name] [name]  has presented today for surgery, with the diagnosis of esophageal ulcers  The various methods of treatment have been discussed with the patient and family. After consideration of risks, benefits and other options for treatment, the patient has consented to  Procedure(s): ESOPHAGOGASTRODUODENOSCOPY (EGD) (N/A) as a surgical intervention .  The patient's history has been reviewed, patient examined, no change in status, stable for surgery.  I have reviewed the patient's chart and labs.  Questions were answered to the patient's satisfaction.     Lina Sar

## 2015-02-05 NOTE — Op Note (Addendum)
[name] [name], [name] [name]   ENDOSCOPY PROCEDURE REPORT  PATIENT: [name], [name]  MR#: [id number] BIRTHDATE: 1941-01-12 , 73  yrs. old GENDER: male ENDOSCOPIST: Hart Carwin, MD REFERRED BY:    Dr Hedy Camara, Hospitalist service PROCEDURE DATE:  02/05/2015 PROCEDURE:  EGD w/ biopsy ASA CLASS:     Class III INDICATIONS:  follow-up grade 4 esophagitis on 01/21/2015. Consideration of long-term anticoagulation, 'post major upper GI bleed, IVC filter. MEDICATIONS: Fentanyl 50 mcg IV and Versed 5 mg IV TOPICAL ANESTHETIC: Cetacaine Spray  DESCRIPTION OF PROCEDURE: After the risks benefits and alternatives of the procedure were thoroughly explained, informed consent was obtained.  The Pentax Gastroscope [id number] endoscope was introduced through the mouth and advanced to the second portion of the duodenum , Without limitations.  The instrument was slowly withdrawn as the mucosa was fully examined.      ESOPHAGUS: A circumferential diffusely abnormal mucosa was found in the entire esophagus.  The mucosa was bleeding, friable, edematous, nodular and had erosions.  Multiple biopsies were performed using cold forceps.  Sample sent for histology. it was consistent with grade 3 esophagitis. which is  some improvement from prior endoscopy on 5/16 /2 016  STOMACH: Bile gastritis (inflammation) was found.   A 3 cm hiatal hernia was noted.  DUODENUM: Mild duodenal inflammation was found in the bulb and second portion of the duodenum.biopsies were taken from edematous mucosa in second portion of duodenum  Retroflexed views revealed no abnormalities.     The scope was then withdrawn from the patient and the procedure completed.  COMPLICATIONS: There were no immediate complications.  ENDOSCOPIC IMPRESSION: 1.   Circumferential diffuse abnormal mucosa was found in the entire esophagus c/m Grade 3  esophagitis. The mucosa was  bleeding, friable, edematous, nodular and had erosions; multiple biopsies were performed 2.   Bile gastritis (inflammation) was found 3.   3 cm hiatal hernia 4.   Duodenal inflammation was found in the bulb and second portion of the duodenum  , bopsies obtained  RECOMMENDATIONS: 1.  Await pathology results 2.  Anti-reflux regimen to be follow 3. do not recommend restarting long-term anticoagulation. Would prefer to wait 2 more weeks 4. Add Carafate slurry 10 cc by mouth 4 times a day 5. Obtain barium esophagram to assess motility as well as degree of reflux 6. Consider adding low dose Reglan  REPEAT EXAM: for EGD pending biopsy results.  eSigned:  Hart Carwin, MD 02/05/2015 10:59 AM    CC:  PATIENT NAME:  [name] [name], [name] [name] MR#: [id number]

## 2015-02-05 NOTE — Progress Notes (Signed)
Occupational Therapy Treatment Patient Details Name: [name] [name] MRN: [id number] DOB: Jan 15, 1941 Today's Date: 02/05/2015    History of present illness Admitted with epigastric pain significant; also with NSTEMI; Cardiac cath revealed total RCA, total prox cfx, severe prox LAD, 50% dz of dominant diagonal - Cards following - will need eventual CABG - TCTS now following - patient will need to fully recover from his severe acute pancreatitis prior to surgery. Increased WOB and GIB with pt transferred to ICU and intubated 5/16 to 5/18. Resident requesting bed to chair only on 5/19 until cleared by cardiology for ambulation.   OT comments  Pt seen for ADL training. Overall requiring min assist, primarily for lower body.  Pt continues to demonstrate some processing difficulties requiring verbal cues.  Stated, "How do I do that?" when asked to doff front opening gown, but was able to perform with extra time.  Pt has made excellent gains in endurance and general strength.    Follow Up Recommendations  SNF;Supervision/Assistance - 24 hour    Equipment Recommendations       Recommendations for Other Services      Precautions / Restrictions Precautions Precautions: Fall       Mobility Bed Mobility                  Transfers   Equipment used: Rolling walker (2 wheeled) Transfers: Sit to/from Stand Sit to Stand: Supervision         General transfer comment: pt stood with no physical assist, safe hand placement on chair, no balance loss    Balance             Standing balance-Leahy Scale: Fair                     ADL Overall ADL's : Needs assistance/impaired     Grooming: Wash/dry hands;Wash/dry face;Brushing hair;Set up;Sitting   Upper Body Bathing: Minimal assitance;Sitting Upper Body Bathing Details (indicate cue type and reason): assist for back Lower Body Bathing: Minimal assistance;Sit to/from stand   Upper Body Dressing : Sitting;Minimal  assistance   Lower Body Dressing: Minimal assistance;Sit to/from stand Lower Body Dressing Details (indicate cue type and reason): doffed socks independently, assist to donn, starting sock over toe     Toileting- Clothing Manipulation and Hygiene: Minimal assistance;Sit to/from stand Toileting - Clothing Manipulation Details (indicate cue type and reason): set up washcloth, pt stood and cleaned front and back with good thorougness       General ADL Comments: Much improved endurance, no rest breaks required for grooming, bathing and dressing, but performed primarily in sitting.      Vision                     Perception     Praxis      Cognition   Behavior During Therapy: WFL for tasks assessed/performed Overall Cognitive Status: Impaired/Different from baseline Area of Impairment: Problem solving              Problem Solving: Slow processing;Decreased initiation;Difficulty sequencing;Requires verbal cues General Comments: difficulty figuring out how to doff front opening gown    Extremity/Trunk Assessment               Exercises     Shoulder Instructions       General Comments      Pertinent Vitals/ Pain       Pain Assessment: No/denies pain  Home Living  Prior Functioning/Environment              Frequency Min 2X/week     Progress Toward Goals  OT Goals(current goals can now be found in the care plan section)     Acute Rehab OT Goals Patient Stated Goal: to recover well enough to have his CABG surgery Time For Goal Achievement: 02/07/15 Potential to Achieve Goals: Good  Plan Discharge plan remains appropriate    Co-evaluation                 End of Session Equipment Utilized During Treatment: Rolling walker   Activity Tolerance Patient tolerated treatment well   Patient Left in chair;with call bell/phone within reach;with family/visitor present   Nurse  Communication          Time: 6060-0459 OT Time Calculation (min): 37 min  Charges: OT General Charges $OT Visit: 1 Procedure OT Treatments $Therapeutic Activity: 23-37 mins  Evern Bio 02/05/2015, 9:02 AM  [phone]

## 2015-02-05 NOTE — Progress Notes (Signed)
[facility] PROGRESS NOTE Interim History: 74 year old male who presented to [facility] hospital for syncopal episode and bradycardia which was later attribute to vasovagal response. Would have persisting epigastric pain with nausea. In the ED at [facility] a CT scan of the abdomen and pelvis show very stone lipase was greater than 300 LFTs were elevated lactic acid of 2.2 with a creatinine 1.7 she was transferred to  [facility] hospital. 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 CT scan of the abdomen and pelvis showed edema around the pancreas compatible with pancreatitis no abscess choledocholithiasis with stone in common bile duct and pleural effusion. Started having black stool and found to be in hemorrhagic shock and had to be transfused several unit of packed red blood cells. Transferred to the ICU for acute respiratory distress intubated and PCCM took over with a chest x-ray which revealed bilateral consolidation. An EGD was performed that showed a grade 4 esophagitis with deep ulcers and friable mucosa. Anticoagulants were held along with aspirin. He is only on Lovenox for DVT prophylaxis. Chest x-ray on 518 showed improving aeration and  was extubated. Lower extremity Doppler done on 01/24/2015 showed a bilateral lower extremity DVTs, IVC filter was placed.   Assessment/Plan: Hemorrhagic shock/acute blood loss anemia due to erosive esophagitis: -EGD done showed esophagitis, hemorraghic shock due to esophagitis plus anti-coagulation, now resolved status post 5 units of packed red blood cells. Keep hemoglobin closer to 9.  - repeated EGD on 5.31.2016 showed healing ulcer. GI recommended to add carafate. - Continue PPI twice a day. - Check anemia panel,  may need IV iron therapy.  Acute gallstone pancreatitis: - MRCP suggested the possibility of a 4 mm stone within the common bile duct. - GI was consulted which recommended laparoscopic cholecystectomy with intraoperative cholangiogram. - Surgery rec cholecystectomy as an outpatient. - He follow-up with surgery as an outpatient after surgery triple-vessel disease problem resolved.  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 - Continue Coreg. Further titration per cardiology. - His Cr cont to improved ? If he might benfeit from ACE-I. - Hemoglobin drop below 8 we'll go ahead and transfuse him a unit of packed red blood cells, will try to keep hemoglobin closer to 9. - Restrict his fluid intake monitor strict I's and O's.  Acute bilateral lower extremity DVT: - Due to hemorrhagic shock.  - IVC filter was placed. Mild drop in hemoglobin, see above for further details. - will not be a candidate for anticoagulation due to GI bleeding.  Ileus: - Resolved with conservative management.  Acute kidney injury: - Renal ultrasound showed no acute findings.  - Now improving with diuresis.  Hypocalcemia: Corrected calcium is actually 9. No further interventions.  Persisting hypokalemia: Low again, will continue to replete  Hypophosphatemia: Has normalized with supplementation.  Syncopal episode most likely vasovagal: No further episodes.  Bradycardia: Felt to be vaguely mediated no resolved.  Thrombocytopenia  HIT panel negative - suspect due to acute inflam illness - no evidence of acute blood loss - platelet count has normalized  Hypotension > hypertension Hypotension resolved with volume expansion - blood  pressure  controlled  DM Blood glucose well controlled continue Lantus plus sliding scale insulin.   Code Status: FULL Family Communication: No family present at time of exam today Disposition Plan: Insurance will not cover CIR - transfer to cardiac telemetry bed while awaiting CABG   Consultants:  [facility]  CVTS  Cardiology  Procedures: Significant Events: 5/08 Transfer from [place] after syncopal episode, gallstones, pancreatitis, elevated troponin I, abnormal echocardiogram 5/09 TTE: EF 30-40%, akinesis of basal-midinferior myocardium - severe hypokinesis lateral myocardium - grade 1 DD 5/12 LHC: severe 3 vessel disease  5/13 CT ABD/Pelvis: infiltration edema around pancreas, c/w acute pancreatitis, no abscess, cholelithiasis with stones in CBD, bilateral pleural effusions with compressive atx. 5/15 transfused one unit PRBCs 5/16 Transferred to ICU with acute respiratory distress. Intubated. PCCM consultation. CXR revealed BLL consolidation 5/16 transfused one unit PRBCs 5/16 EGD: grade 4 esophagitis with deep ulcers, eschar, friability, blood clllots in stomach, no gastric ulcers or mucosal abnormalities, mod edema of 2nd portion of duodenum c/w pancreatitis. PPI infusion initiated. Heparin disconitnued. OGT left out 5/17 transfused one unit PRBCs for Hgb 7.4 in setting of ACS 5/18 CXR with improved basilar aeration. Extubated 5/19 LE venous duplex scans + B LE DVT 5/19 IVC filter placed    Antibiotics: Zosyn 5/12 > 5/13 Vancomycin 5/16 > 5/19 Primaxin 5/16 > 5/25  HPI/Subjective: Feeling better, no shortness of breath or weakness with ambulation, no black stools or red stools.  Objective: Filed Vitals:   02/05/15 0401 02/05/15 0835 02/05/15 0935 02/05/15 1006  BP: 125/60 132/53 116/52 123/53  Pulse: 84   71  Temp: 98.7 F (37.1 C)  98.5 F (36.9 C)   TempSrc: Oral  Oral   Resp: Height:      Weight: 81.3 kg (179 lb 3.7 oz)     SpO2: 97%  99% 98%     Intake/Output Summary (Last 24 hours) at 02/05/15 1031 Last data filed at 02/04/15 2200  Gross per 24 hour  Intake   1063 ml  Output   1111 ml  Net    -48 ml   Filed Weights   01/24/15 0500 01/25/15 0400 02/05/15 0401  Weight: 86.7 kg (191 lb 2.2 oz) 85.2 kg (187 lb 13.3 oz) 81.3 kg (179 lb 3.7 oz)    Exam:  General: Alert, awake, oriented x3, in no acute distress.  HEENT: No bruits, no goiter.  Heart: Regular rate and rhythm.lower ext edema. Lungs: Good air movement, clear Abdomen: Soft, nontender, nondistended, positive bowel sounds.  Neuro: Grossly intact, nonfocal.   Data Reviewed: Basic Metabolic Panel:  Recent Labs Lab 01/31/15 0429 02/02/15 0002 02/04/15 0004 02/05/15 0345  NA 142 143 143 140  K 3.8 3.7 3.3* 4.4  CL 112* 110 110 107  CO2 21* GLUCOSE 170* 152* 147* 183*  BUN 27* 23* 20 20  CREATININE 1.69* 1.53* 1.35* 1.29*  CALCIUM 7.2* 7.1* 7.1* 7.2*   Liver Function Tests:  Recent Labs Lab 01/31/15 0429  AST 63*  ALT 47  ALKPHOS 78  BILITOT 1.0  PROT 5.4*  ALBUMIN 1.5*   No results for input(s): LIPASE, AMYLASE in the last 168 hours. No results for input(s): AMMONIA in the last 168 hours. CBC:  Recent Labs Lab 01/31/15 0429 02/02/15 0002 02/04/15 0004 02/04/15 1725  WBC 10.2 8.2 6.2 6.8  HGB 9.4* 8.6* 7.9* 9.6*  HCT 29.8* 26.5* 24.8* 29.7*  MCV 92.0 89.2 89.9 87.6  PLT 197  177 163 181   Cardiac Enzymes: No results for input(s): CKTOTAL, CKMB, CKMBINDEX, TROPONINI in the last 168 hours. BNP (last 3 results) No results for input(s): BNP in the last 8760 hours.  ProBNP (last 3 results) No results for input(s): PROBNP in the last 8760 hours.  CBG:  Recent Labs Lab 02/04/15 0620 02/04/15 1208 02/04/15 1630 02/04/15 2058 02/05/15 0558  GLUCAP 126* 175* 155* 185* 212*    Recent Results (from the past 240 hour(s))  Urine culture     Status: None   Collection Time: 01/28/15  6:26 PM  Result Value Ref Range  Status   Specimen Description URINE, CLEAN CATCH  Final   Special Requests Normal  Final   Colony Count NO GROWTH Performed at [facility]   Final   Culture NO GROWTH Performed at [facility]   Final   Report Status 01/29/2015 FINAL  Final  Clostridium Difficile by PCR     Status: None   Collection Time: 02/01/15  5:16 AM  Result Value Ref Range Status   C difficile by pcr NEGATIVE NEGATIVE Final     Studies: No results found.  Scheduled Meds: . carvedilol  6.25 mg Oral BID WC  . enoxaparin (LOVENOX) injection  40 mg Subcutaneous Q24H  . feeding supplement (ENSURE ENLIVE)  237 mL Oral TID BM  . furosemide  40 mg Oral Daily  . insulin aspart  0-20 Units Subcutaneous TID WC  . insulin glargine  10 Units Subcutaneous BID  . latanoprost  1 drop Both Eyes QHS  . pantoprazole sodium  40 mg Oral BID  . saccharomyces boulardii  250 mg Oral BID   Continuous Infusions: . sodium chloride 500 mL (02/05/15 0942)    Time Spent: 25 min   Marinda Elk  [facility] Pager [phone]. If 7PM-7AM, please contact night-coverage at [email or web address], password [facility] 02/05/2015, 10:31 AM  LOS: 23 days

## 2015-02-05 NOTE — H&P (View-Only) (Signed)
 [facility] PROGRESS NOTE Interim History: 74-year-old male who presented to [facility] for syncopal episode and bradycardia which was later attribute to vasovagal response. Would have persisting epigastric pain with nausea. In the ED at [facility] a CT scan of the abdomen and pelvis show very stone lipase was greater than 300 LFTs were elevated lactic acid of 2.2 with a creatinine 1.7 she was transferred to  [place]. 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 CT scan of the abdomen and pelvis showed edema around the pancreas compatible with pancreatitis no abscess choledocholithiasis with stone in common bile duct and pleural effusion. Started having black stool and found to be in hemorrhagic shock and had to be transfused several unit of packed red blood cells. Transferred to the ICU for acute respiratory distress intubated and PCCM took over with a chest x-ray which revealed bilateral consolidation. An EGD was performed that showed a grade 4 esophagitis with deep ulcers and friable mucosa. Anticoagulants were held along with aspirin. He is only on Lovenox for DVT prophylaxis. Chest x-ray on 518 showed improving aeration and  was extubated. Lower extremity Doppler done on 01/24/2015 showed a bilateral lower extremity DVTs, IVC filter was placed.   Assessment/Plan: Hemorrhagic shock/acute blood loss anemia due to erosive esophagitis: -EGD done showed esophagitis, hemorraghic shock due to esophagitis plus anti-coagulation, now resolved status post 5 units of packed red blood cells. Keep hemoglobin closer to 9.  - Continue PPI twice a day there is no evidence of ongoing bleeding at this time. - We'll have to discuss with GI when to start  anticoagulation. No signs of overt bleeding.  Acute gallstone pancreatitis: - MRCP suggested the possibility of a 4 mm stone within the common bile duct. - GI was consulted which recommended laparoscopic cholecystectomy with intraoperative cholangiogram. - Surgery rec cholecystectomy as an outpatient. - He follow-up with surgery as an outpatient after surgery triple-vessel disease problem resolved.  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 - Continue Coreg. Further titration per cardiology. - His Cr cont to improved ? If he might benfeit from ACE-I. - Hemoglobin drop below 8 we'll go ahead and transfuse him a unit of packed red blood cells, will try to keep hemoglobin closer to 9. - Restrict his fluid intake monitor strict I's and O's.  Acute bilateral lower extremity DVT: - He is currently on only prophylactic Lovenox due to hemorrhagic shock.  - IVC filter was placed. Mild drop in hemoglobin, see above for further details. - will not be a candidate for anticoagulation due to GI bleeding.  Ileus: - Resolved with conservative management.  Acute kidney injury: - Renal ultrasound showed no acute findings.  - Now improving with diuresis.  Hypocalcemia: Corrected calcium is actually 9. No further interventions.  Persisting hypokalemia: Low again, will continue to replete  Hypophosphatemia: Has normalized with supplementation.  Syncopal episode most likely vasovagal: No further episodes.  Bradycardia: Felt to be vaguely mediated no resolved.  Thrombocytopenia  HIT panel negative - suspect due to acute inflam illness - no evidence of acute blood loss - platelet count has normalized    Hypotension >  hypertension Hypotension resolved with volume expansion - blood pressure controlled  DM Blood glucose well controlled continue Lantus plus sliding scale insulin.   Code Status: FULL Family Communication: No family present at time of exam today Disposition Plan: Insurance will not cover CIR - transfer to cardiac telemetry bed while awaiting CABG   Consultants:   GI  CVTS  Cardiology  Procedures: Significant Events: 5/08 Transfer from [facility] after syncopal episode, gallstones, pancreatitis, elevated troponin I, abnormal echocardiogram 5/09 TTE: EF 30-40%, akinesis of basal-midinferior myocardium - severe hypokinesis lateral myocardium - grade 1 DD 5/12 LHC: severe 3 vessel disease  5/13 CT ABD/Pelvis: infiltration edema around pancreas, c/w acute pancreatitis, no abscess, cholelithiasis with stones in CBD, bilateral pleural effusions with compressive atx. 5/15 transfused one unit PRBCs 5/16 Transferred to ICU with acute respiratory distress. Intubated. PCCM consultation. CXR revealed BLL consolidation 5/16 transfused one unit PRBCs 5/16 EGD: grade 4 esophagitis with deep ulcers, eschar, friability, blood clllots in stomach, no gastric ulcers or mucosal abnormalities, mod edema of 2nd portion of duodenum c/w pancreatitis. PPI infusion initiated. Heparin disconitnued. OGT left out 5/17 transfused one unit PRBCs for Hgb 7.4 in setting of ACS 5/18 CXR with improved basilar aeration. Extubated 5/19 LE venous duplex scans + B LE DVT 5/19 IVC filter placed    Antibiotics: Zosyn 5/12 > 5/13 Vancomycin 5/16 > 5/19 Primaxin 5/16 > 5/25  HPI/Subjective: Feeling better, no shortness of breath or weakness with ambulation, no black stools or red stools.  Objective: Filed Vitals:   02/03/15 0536 02/03/15 1447 02/03/15 1955 02/04/15 0416  BP: 111/53 115/57 123/63 114/48  Pulse: 74 73 82 73  Temp: 98.3 F (36.8 C) 97.7 F (36.5 C) 98.3 F (36.8 C) 98.1 F (36.7 C)   TempSrc: Oral Oral Oral Oral  Resp: 18 18 18 16  Height:      Weight:      SpO2: 98% 98% 99% 96%    Intake/Output Summary (Last 24 hours) at 02/04/15 0848 Last data filed at 02/04/15 0417  Gross per 24 hour  Intake    240 ml  Output    751 ml  Net   -511 ml   Filed Weights   01/23/15 0600 01/24/15 0500 01/25/15 0400  Weight: 89.7 kg (197 lb 12 oz) 86.7 kg (191 lb 2.2 oz) 85.2 kg (187 lb 13.3 oz)    Exam:  General: Alert, awake, oriented x3, in no acute distress.  HEENT: No bruits, no goiter.  Heart: Regular rate and rhythm.lower ext edema. Lungs: Good air movement, clear Abdomen: Soft, nontender, nondistended, positive bowel sounds.  Neuro: Grossly intact, nonfocal.   Data Reviewed: Basic Metabolic Panel:  Recent Labs Lab 01/29/15 0052 01/29/15 0103 01/31/15 0429 02/02/15 0002 02/04/15 0004  NA 144  --  142 143 143  K 4.1  --  3.8 3.7 3.3*  CL 116*  --  112* 110 110  CO2 20*  --  21* 22 25  GLUCOSE 218*  --  170* 152* 147*  BUN 27*  --  27* 23* 20  CREATININE 1.84*  --  1.69* 1.53* 1.35*  CALCIUM 6.8*  --  7.2* 7.1* 7.1*  PHOS  --  4.1  --   --   --    Liver Function Tests:  Recent Labs Lab 01/29/15 0052 01/31/15 0429  AST 63* 63*  ALT 57 47  ALKPHOS 63 78  BILITOT 1.1 1.0  PROT 5.1* 5.4*  ALBUMIN 1.4* 1.5*     No results for input(s): LIPASE, AMYLASE in the last 168 hours. No results for input(s): AMMONIA in the last 168 hours. CBC:  Recent Labs Lab 01/28/15 1825 01/29/15 0052 01/31/15 0429 02/02/15 0002 02/04/15 0004  WBC 12.7* 11.2* 10.2 8.2 6.2  HGB 9.3* 8.7* 9.4* 8.6* 7.9*  HCT 29.5* 27.6* 29.8* 26.5* 24.8*  MCV 91.6 92.0 92.0 89.2 89.9  PLT 216 201 197 177 163   Cardiac Enzymes: No results for input(s): CKTOTAL, CKMB, CKMBINDEX, TROPONINI in the last 168 hours. BNP (last 3 results) No results for input(s): BNP in the last 8760 hours.  ProBNP (last 3 results) No results for input(s): PROBNP in the last 8760  hours.  CBG:  Recent Labs Lab 02/02/15 2118 02/03/15 0607 02/03/15 1115 02/03/15 2114 02/04/15 0620  GLUCAP 165* 184* 252* 125* 126*    Recent Results (from the past 240 hour(s))  Urine culture     Status: None   Collection Time: 01/28/15  6:26 PM  Result Value Ref Range Status   Specimen Description URINE, CLEAN CATCH  Final   Special Requests Normal  Final   Colony Count NO GROWTH Performed at [facility]   Final   Culture NO GROWTH Performed at [facility]   Final   Report Status 01/29/2015 FINAL  Final  Clostridium Difficile by PCR     Status: None   Collection Time: 02/01/15  5:16 AM  Result Value Ref Range Status   C difficile by pcr NEGATIVE NEGATIVE Final     Studies: Dg Chest 2 View  02/03/2015   CLINICAL DATA:  Shortness of breath.  Follow-up exam.  EXAM: CHEST  2 VIEW  COMPARISON:  01/25/2015  FINDINGS: Lung aeration has significantly improved. There is persistent lung base opacity consistent with a combination of atelectasis and small pleural effusions. No evidence of residual pulmonary edema.  No pneumothorax.  Cardiac silhouette normal in size and configuration. Normal mediastinal and hilar contours.  Right internal jugular central venous line has been removed. No remaining indwelling support apparatus.  IMPRESSION: 1. There has been significant improvement since the previous chest radiograph. No evidence of residual pulmonary edema or pneumonia. 2. Mild lung base atelectasis and small pleural effusions persist.   Electronically Signed   By: David  Ormond M.D.   On: 02/03/2015 07:47    Scheduled Meds: . carvedilol  6.25 mg Oral BID WC  . enoxaparin (LOVENOX) injection  40 mg Subcutaneous Q24H  . feeding supplement (ENSURE ENLIVE)  237 mL Oral TID BM  . furosemide  40 mg Oral Daily  . insulin aspart  0-20 Units Subcutaneous TID WC  . insulin glargine  10 Units Subcutaneous BID  . latanoprost  1 drop Both Eyes QHS  . pantoprazole sodium  40  mg Oral BID  . potassium chloride  40 mEq Oral Once  . saccharomyces boulardii  250 mg Oral BID   Continuous Infusions:   Time Spent: 25 min   [name] [name], [name] [name]  [facility] Pager [phone]. If 7PM-7AM, please contact night-coverage at [email or web address], password [email or web address] 02/04/2015, 8:48 AM  LOS: 22 days       

## 2015-02-06 ENCOUNTER — Encounter (HOSPITAL_COMMUNITY): Payer: Self-pay | Admitting: Internal Medicine

## 2015-02-06 ENCOUNTER — Other Ambulatory Visit: Payer: Self-pay | Admitting: *Deleted

## 2015-02-06 ENCOUNTER — Inpatient Hospital Stay (HOSPITAL_COMMUNITY): Payer: Medicare Other

## 2015-02-06 DIAGNOSIS — J690 Pneumonitis due to inhalation of food and vomit: Secondary | ICD-10-CM | POA: Insufficient documentation

## 2015-02-06 DIAGNOSIS — I2510 Atherosclerotic heart disease of native coronary artery without angina pectoris: Secondary | ICD-10-CM

## 2015-02-06 LAB — CBC
HEMATOCRIT: 29.3 % — AB (ref 39.0–52.0)
Hemoglobin: 9.2 g/dL — ABNORMAL LOW (ref 13.0–17.0)
MCH: 28 pg (ref 26.0–34.0)
MCHC: 31.4 g/dL (ref 30.0–36.0)
MCV: 89.3 fL (ref 78.0–100.0)
Platelets: 165 10*3/uL (ref 150–400)
RBC: 3.28 MIL/uL — ABNORMAL LOW (ref 4.22–5.81)
RDW: 14.8 % (ref 11.5–15.5)
WBC: 6.4 10*3/uL (ref 4.0–10.5)

## 2015-02-06 LAB — GLUCOSE, CAPILLARY
Glucose-Capillary: 197 mg/dL — ABNORMAL HIGH (ref 65–99)
Glucose-Capillary: 212 mg/dL — ABNORMAL HIGH (ref 65–99)
Glucose-Capillary: 200 mg/dL — ABNORMAL HIGH (ref 65–99)
Glucose-Capillary: 175 mg/dL — ABNORMAL HIGH (ref 65–99)

## 2015-02-06 LAB — IRON AND TIBC
Iron: 26 ug/dL — ABNORMAL LOW (ref 45–182)
Saturation Ratios: 15 % — ABNORMAL LOW (ref 17.9–39.5)
TIBC: 174 ug/dL — ABNORMAL LOW (ref 250–450)
UIBC: 148 ug/dL

## 2015-02-06 LAB — BASIC METABOLIC PANEL
Anion gap: 11 (ref 5–15)
BUN: 21 mg/dL — ABNORMAL HIGH (ref 6–20)
CALCIUM: 7.4 mg/dL — AB (ref 8.9–10.3)
CO2: 23 mmol/L (ref 22–32)
Chloride: 106 mmol/L (ref 101–111)
Creatinine, Ser: 1.35 mg/dL — ABNORMAL HIGH (ref 0.61–1.24)
GFR calc Af Amer: 59 mL/min — ABNORMAL LOW (ref >60)
GFR calc non Af Amer: 51 mL/min — ABNORMAL LOW (ref >60)
GLUCOSE: 225 mg/dL — AB (ref 65–99)
Potassium: 4.3 mmol/L (ref 3.5–5.1)
SODIUM: 140 mmol/L (ref 135–145)

## 2015-02-06 LAB — RETICULOCYTES
RBC.: 3.24 MIL/uL — ABNORMAL LOW (ref 4.22–5.81)
RETIC COUNT ABSOLUTE: 97.2 10*3/uL (ref 19.0–186.0)
RETIC CT PCT: 3 % (ref 0.4–3.1)

## 2015-02-06 LAB — FERRITIN: Ferritin: 517 ng/mL — ABNORMAL HIGH (ref 24–336)

## 2015-02-06 LAB — FOLATE: FOLATE: 9.8 ng/mL (ref >5.9)

## 2015-02-06 LAB — VITAMIN B12: Vitamin B-12: 1284 pg/mL — ABNORMAL HIGH (ref 180–914)

## 2015-02-06 MED ORDER — PRO-STAT SUGAR FREE PO LIQD
30.0000 mL | Freq: Every day | ORAL | Status: DC
  Administered 2015-02-06 – 2015-02-12 (×7): 30 mL via ORAL
  Filled 2015-02-06 (×9): qty 30

## 2015-02-06 MED ORDER — BOOST / RESOURCE BREEZE PO LIQD
1.0000 | Freq: Two times a day (BID) | ORAL | Status: DC
  Administered 2015-02-06 – 2015-02-13 (×7): 1 via ORAL

## 2015-02-06 NOTE — Progress Notes (Signed)
    Subjective:  States he feels "fine." Apparently aspirated during his barium swallow this morning and still has some coughing. Otherwise no complaints  Objective:  Vital Signs in the last 24 hours: Temp:  [98 F (36.7 C)-98.7 F (37.1 C)] 98.7 F (37.1 C) (06/01 0621) Pulse Rate:  [72-82] 80 (06/01 0621) Resp:  [17-18] 17 (06/01 0621) BP: (113-128)/(49-54) 115/54 mmHg (06/01 0621) SpO2:  [96 %-100 %] 96 % (06/01 0621) Weight:  [179 lb 0.2 oz (81.2 kg)] 179 lb 0.2 oz (81.2 kg) (06/01 0621)  Intake/Output from previous day: 05/31 0701 - 06/01 0700 In: 597 [P.O.:597] Out: 2101 [Urine:2100; Stool:1]  Physical Exam: Pt is alert and oriented, NAD HEENT: normal Neck: JVP - normal Lungs: CTA bilaterally CV: RRR without murmur or gallop Abd: soft, NT, Positive BS, no hepatomegaly Ext: 2+ pretibial edema bilaterally, distal pulses intact and equal Skin: warm/dry no rash   Lab Results:  Recent Labs  02/04/15 1725 02/06/15 0237  WBC 6.8 6.4  HGB 9.6* 9.2*  PLT 181 165    Recent Labs  02/05/15 0345 02/06/15  NA 140 140  K 4.4 4.3  CL 107 106  CO2 25 23  GLUCOSE 183* 225*  BUN 20 21*  CREATININE 1.29* 1.35*   No results for input(s): TROPONINI in the last 72 hours.  Invalid input(s): CK, MB  Tele: Sinus rhythm  Assessment/Plan:  1. Severe multivessel coronary artery disease: Plans for CABG once his nutritional status and medical problems are improved. Timing somewhat unclear to me. I have a call into Dr. Donata Clay to discuss - wonder if inpatient prior to CABG would be appropriate?  2. Severe ischemic CM: LVEF 30-35%. Avoid ACE in setting of recent AKI with ongoing borderline BP. Continue coreg and lasix as tolerated.  3. Erosive esophagitis: appreciate GI care. Off of antiplatelet/anticoagulant drugs x 2 weeks.  4. AKI: appears stable with GFR about 50  5. Severe protein calorie malnutrition   Tonny Bollman, M.D. 02/06/2015, 12:05 PM

## 2015-02-06 NOTE — Progress Notes (Signed)
CARDIAC REHAB PHASE I   PRE:  Rate/Rhythm: 89 SR c. PVCs  BP:  Sitting: 115/64        SaO2: 99 RA  MODE:  Ambulation: 550 ft   POST:  Rate/Rhythm: 92 SR  BP:  Sitting: 136/62         SaO2: 99 RA  Pt ambulated 550 ft on RA, assist x1, slow steady gait, tolerated well. Pt denies CP, dizziness, DOE, declined rest stop. Pt states he tries to "go a little more each day." Pt to recliner after walk, feet elevated, call bell within reach, daughter at bedside.   [phone]  Joylene Grapes, RN, BSN 02/06/2015 3:24 PM

## 2015-02-06 NOTE — Progress Notes (Signed)
Daily Rounding Note  02/06/2015, 11:15 AM  LOS: 24 days   SUBJECTIVE:       No complaints.  Brown stool today.  Loose stools yesterday.  Esophagram completed.  Walking with PT in hall  OBJECTIVE:         Vital signs in last 24 hours:    Temp:  [98 F (36.7 C)-98.7 F (37.1 C)] 98.7 F (37.1 C) (06/01 0621) Pulse Rate:  [72-82] 80 (06/01 0621) Resp:  [17-18] 17 (06/01 0621) BP: (113-128)/(49-54) 115/54 mmHg (06/01 0621) SpO2:  [96 %-100 %] 96 % (06/01 0621) Weight:  [179 lb 0.2 oz (81.2 kg)] 179 lb 0.2 oz (81.2 kg) (06/01 0621) Last BM Date: 02/05/15 Filed Weights   01/25/15 0400 02/05/15 0401 02/06/15 1610  Weight: 187 lb 13.3 oz (85.2 kg) 179 lb 3.7 oz (81.3 kg) 179 lb 0.2 oz (81.2 kg)   General: looks chronically ill, frail, unwell   Heart: RRR Chest: clear Abdomen: NT, ND.  Active BS  Extremities: no CCE Neuro/Psych:  Oriented x 3.  Affect bland.  Appropriate.   Intake/Output from previous day: 05/31 0701 - 06/01 0700 In: 597 [P.O.:597] Out: 2101 [Urine:2100; Stool:1]  Intake/Output this shift:    Lab Results:  Recent Labs  02/04/15 0004 02/04/15 1725 02/06/15 0237  WBC 6.2 6.8 6.4  HGB 7.9* 9.6* 9.2*  HCT 24.8* 29.7* 29.3*  PLT 163 181 165   BMET  Recent Labs  02/04/15 0004 02/05/15 0345 02/06/15  NA 143 140 140  K 3.3* 4.4 4.3  CL 110 107 106  CO2 GLUCOSE 147* 183* 225*  BUN 20 20 21*  CREATININE 1.35* 1.29* 1.35*  CALCIUM 7.1* 7.2* 7.4*   LFT No results for input(s): PROT, ALBUMIN, AST, ALT, ALKPHOS, BILITOT, BILIDIR, IBILI in the last 72 hours. PT/INR No results for input(s): LABPROT, INR in the last 72 hours. Hepatitis Panel No results for input(s): HEPBSAG, HCVAB, HEPAIGM, HEPBIGM in the last 72 hours.  Studies/Results: No results found.   Scheduled Meds: . carvedilol  6.25 mg Oral BID WC  . feeding supplement (ENSURE ENLIVE)  237 mL Oral TID BM  . furosemide   40 mg Oral Daily  . insulin aspart  0-20 Units Subcutaneous TID WC  . insulin glargine  10 Units Subcutaneous BID  . latanoprost  1 drop Both Eyes QHS  . pantoprazole sodium  40 mg Oral BID  . saccharomyces boulardii  250 mg Oral BID  . sucralfate  1 g Oral 4 times per day   Continuous Infusions:  PRN Meds:.acetaminophen, bisacodyl, fentaNYL (SUBLIMAZE) injection, ondansetron (ZOFRAN) IV   ASSESMENT:   *  Severe reflux esophagitis.   Cg emesis, EGD 5/16: grade 4 esophagitis. tereated with high dose PPI.  EGD # 2 5/31: grade 3 esophagitis, still with fribility, bleeding, nodularity. Still having melenic stool Suggested anticoagulation remain on hold.  Carafate added.    *  ABL anemia.  Transfusion PRBC x 6 total for this admit.  Single PRBC on 5/30.   *  Coagulopathy.  INR 1.5, PT 18.5 on 5/16.   Only anticoagulation was Lovenox, discontinued.  Last dose on 5/30   *  Acute pancreatitis on admission 5/8  *  Non-STEMI, severe 3vsl CAD by cath.  CABG postponed to allow for medical recovery.   *  DVT.  S/p IVC filter.   *  AKI  *  IDDM   PLAN   *  Esophagram completed today, await findings.  Nurse reports he aspirated during the study.  Suggest SLP eval of his swallowing, Dr Elvera Lennox aware.  Marland Kitchen   *  Wonder if Reglan at HS would help decrease the reflux?     Jennye Moccasin  02/06/2015, 11:15 AM Pager: [phone] Attending MD note:   I have taken a history, examined the patient, and reviewed the chart. I agree with the Advanced Practitioner's impression and recommendations. Gross aspiration on attempted barium study. Recommend Speech Path evaluation. And recommendation for a diet . Low dose Reglan OK to try , but stop if CNS sideffects  [facility] Pager # [phone]

## 2015-02-06 NOTE — Progress Notes (Signed)
Physical Therapy Treatment Patient Details Name: [name] [name] MRN: [id number] DOB: [birth year] Today's Date: 02/06/2015    History of Present Illness Admitted with epigastric pain significant; also with NSTEMI; Cardiac cath revealed total RCA, total prox cfx, severe prox LAD, 50% dz of dominant diagonal - Cards following - will need eventual CABG - TCTS now following - patient will need to fully recover from his severe acute pancreatitis prior to surgery. Increased WOB and GIB with pt transferred to ICU and intubated 5/16 to 5/18.    PT Comments    Focused on standing balance activities with and without UE support. Pt continues to be unsteady without RW and needs up to min assist to maintain balance with standing exercises/weight-shifting. Very motivated. Daughter inquired re: ? Advancing his exercise program as he can easily do 20 reps of all seated exercises. She has been ambulating with pt safely with RW, therefore added standing marching (with RW) and standing hamstring curls (to stretch anterior hip/thigh as pt reports he feels tight/discomfort).    Follow Up Recommendations  Supervision/Assistance - 24 hour;SNF     Equipment Recommendations  3in1 (PT)    Recommendations for Other Services       Precautions / Restrictions Precautions Precautions: Fall Precaution Comments: Fall risk greatly reduced with use of RW Restrictions Weight Bearing Restrictions: No    Mobility  Bed Mobility               General bed mobility comments: pt received in chair  Transfers Overall transfer level: Needs assistance Equipment used: Rolling walker (2 wheeled);None Transfers: Sit to/from Stand Sit to Stand: Supervision;Min guard         General transfer comment: pt stood with RW with no physical assist, safe hand placement on chair, no balance loss; without RW min-guard for safety due to slight unsteadiness  Ambulation/Gait             General Gait Details: deferred (pt just  back from barium swallow and aspirated barium with frequent coughing-productive)   Stairs            Wheelchair Mobility    Modified Rankin (Stroke Patients Only)       Balance Overall balance assessment: Needs assistance         Standing balance support: No upper extremity supported Standing balance-Leahy Scale: Poor Standing balance comment: can stand without physical assist, however with incr anterior-posterior sway requiring supervision         R[place]berg - Eyes Opened: 3   High level balance activites: Side stepping;Backward walking;Turns;Head turns;Other (comment) (forward reaching, standing exercises with weight shifts) High Level Balance Comments: initially holding onto RW, progressed to light/fingers touching RW to no walker/UE support    Cognition Arousal/Alertness: Awake/alert Behavior During Therapy: WFL for tasks assessed/performed Overall Cognitive Status: Within Functional Limits for tasks assessed                      Exercises General Exercises - Lower Extremity Ankle Circles/Pumps: AROM;Both;10 reps (reminded to do every hour when awake) Hip Flexion/Marching: AAROM;Both;10 reps;Standing (assist for balance as "high" march without UE support) Toe Raises: AAROM;Both;10 reps;Standing (assist for balance; bil and unilateral) Heel Raises: AAROM;Both;10 reps;Standing (very weak PF with poor heel clearance; assist for balance)    General Comments        Pertinent Vitals/Pain Pain Assessment: No/denies pain    Home Living  Prior Function            PT Goals (current goals can now be found in the care plan section) Acute Rehab PT Goals Patient Stated Goal: to recover well enough to have his CABG surgery Time For Goal Achievement: 02/11/15 Potential to Achieve Goals: Good Progress towards PT goals: Progressing toward goals    Frequency  Min 3X/week    PT Plan Current plan remains appropriate     Co-evaluation             End of Session Equipment Utilized During Treatment: Gait belt Activity Tolerance: Patient tolerated treatment well Patient left: in chair;with call bell/phone within reach;with family/visitor present     Time: 0938-1829 PT Time Calculation (min) (ACUTE ONLY): 28 min  Charges:  $Therapeutic Exercise: 23-37 mins                    G Codes:      Leinani Lisbon 02-25-2015, 11:51 AM Pager [phone]

## 2015-02-06 NOTE — Care Management Note (Signed)
Case Management Note  Patient Details  Name: [name] [name] MRN: [id number] Date of Birth: [birth year]  Subjective/Objective:                    Action/Plan:   Expected Discharge Date:                  Expected Discharge Plan:  Skilled Nursing Facility  In-House Referral:  Clinical Social Work  Discharge planning Services     Post Acute Care Choice:    Choice offered to:     DME Arranged:    DME Agency:     HH Arranged:    HH Agency:     Status of Service:     Medicare Important Message Given:  Yes Date Medicare IM Given:  01/28/15 Medicare IM give by:  debbie dowell rn,bsn Date Additional Medicare IM Given:  02/05/15 Additional Medicare Important Message give by:  Raynald Blend  If discussed at Long Length of Stay Meetings, dates discussed:  02/05/2015  Additional Comments:  Yvone Neu, RN 02/06/2015, 9:15 AM

## 2015-02-06 NOTE — Progress Notes (Signed)
[facility] PROGRESS NOTE Interim History: 74 year old male who presented to [facility] hospital for syncopal episode and bradycardia which was later attribute to vasovagal response. Would have persisting epigastric pain with nausea. In the ED at [facility] a CT scan of the abdomen and pelvis show very stone lipase was greater than 300 LFTs were elevated lactic acid of 2.2 with a creatinine 1.7 she was transferred to  [facility] hospital. 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 CT scan of the abdomen and pelvis showed edema around the pancreas compatible with pancreatitis no abscess choledocholithiasis with stone in common bile duct and pleural effusion. Started having black stool and found to be in hemorrhagic shock and had to be transfused several unit of packed red blood cells. Transferred to the ICU for acute respiratory distress intubated and PCCM took over with a chest x-ray which revealed bilateral consolidation. An EGD was performed that showed a grade 4 esophagitis with deep ulcers and friable mucosa. Anticoagulants were held along with aspirin. He is only on Lovenox for DVT prophylaxis. Chest x-ray on 518 showed improving aeration and  was extubated. Lower extremity Doppler done on 01/24/2015 showed a bilateral lower extremity DVTs, IVC filter was placed.  HPI/Subjective: - had barium swallow this morning, severe cough appearing that he is aspirating - no chest pain, shortness of breath, no abdominal pain, nausea or vomiting.   Assessment/Plan:  NSTEMI with severe triple-vessel disease/chronic systolic heart failure/ischemic cardiomyopathy: - Cardiac cath revealed severe triple-vessel disease, Cardiology and TCTS suggest the patient not leave the  hospital though patient will need to fully recover from his severe acute pancreatitis prior to surgery. - Continue Coreg. Further titration per cardiology. - His Cr cont to improved ? If he might benfeit from ACE-I. - keep Hb > 8 - Restrict his fluid intake monitor strict I's and O's.  Hemorrhagic shock/acute blood loss anemia due to erosive esophagitis: - EGD done showed esophagitis, hemorraghic shock due to esophagitis plus anti-coagulation, now resolved status post 5 units of packed red blood cells. Keep hemoglobin closer to 9.  - repeated EGD on 5.31.2016 showed healing ulcer. GI recommended to add carafate. - Continue PPI twice a day. - ferritin high, B12 high  Acute gallstone pancreatitis: - MRCP suggested the possibility of a 4 mm stone within the common bile duct. - GI was consulted which recommended laparoscopic cholecystectomy with intraoperative cholangiogram. - Surgery rec cholecystectomy as an outpatient. - He follow-up with surgery as an outpatient after surgery triple-vessel disease problem resolved.  Acute bilateral lower extremity DVT: - IVC filter was placed. - will not be a candidate for anticoagulation due to GI bleeding for the next 2 weeks at least per GI  Ileus: - Resolved with conservative management. - with intermittent diarrhea, C diff negative x 2  Acute kidney injury: - Renal ultrasound showed no acute findings.  - continue Lasix   Hypocalcemia: - Corrected calcium is actually 9. No further interventions.  Persisting hypokalemia: - stable today   Hypophosphatemia: - Has normalized with supplementation.  Syncopal episode most likely vasovagal: - No further episodes.  Bradycardia: - Felt to be vaguely mediated no resolved.  Thrombocytopenia  - HIT panel negative - suspect due to acute inflam illness - no evidence of acute blood loss - platelet count has normalized  Hypotension >  hypertension - Hypotension resolved with volume expansion -  blood pressure controlled  DM - Blood glucose well controlled continue Lantus plus sliding scale insulin.   Code Status: FULL Family Communication: discussed with his wife and daughter bedside Disposition Plan: Insurance will not cover CIR - cardiac telemetry bed while awaiting CABG   Consultants:  [facility]  CVTS  Cardiology  Procedures: Significant Events: 5/08 Transfer from [place] after syncopal episode, gallstones, pancreatitis, elevated troponin I, abnormal echocardiogram 5/09 TTE: EF 30-40%, akinesis of basal-midinferior myocardium - severe hypokinesis lateral myocardium - grade 1 DD 5/12 LHC: severe 3 vessel disease  5/13 CT ABD/Pelvis: infiltration edema around pancreas, c/w acute pancreatitis, no abscess, cholelithiasis with stones in CBD, bilateral pleural effusions with compressive atx. 5/15 transfused one unit PRBCs 5/16 Transferred to ICU with acute respiratory distress. Intubated. PCCM consultation. CXR revealed BLL consolidation 5/16 transfused one unit PRBCs 5/16 EGD: grade 4 esophagitis with deep ulcers, eschar, friability, blood clllots in stomach, no gastric ulcers or mucosal abnormalities, mod edema of 2nd portion of duodenum c/w pancreatitis. PPI infusion initiated. Heparin disconitnued. OGT left out 5/17 transfused one unit PRBCs for Hgb 7.4 in setting of ACS 5/18 CXR with improved basilar aeration. Extubated 5/19 LE venous duplex scans + B LE DVT 5/19 IVC filter placed   Antibiotics: Zosyn 5/12 > 5/13 Vancomycin 5/16 > 5/19 Primaxin 5/16 > 5/25  Objective: Filed Vitals:   02/05/15 1100 02/05/15 1335 02/05/15 2207 02/06/15 0621  BP: 111/47 128/53 113/49 115/54  Pulse: 70 82 72 80  Temp:  98.3 F (36.8 C) 98 F (36.7 C) 98.7 F (37.1 C)  TempSrc:  Oral Oral Oral  Resp: Height:      Weight:    81.2 kg (179 lb 0.2 oz)  SpO2: 100% 100% 98% 96%    Intake/Output Summary (Last 24 hours) at 02/06/15 1358 Last data filed at  02/05/15 2208  Gross per 24 hour  Intake    237 ml  Output   1051 ml  Net   -814 ml   Filed Weights   01/25/15 0400 02/05/15 0401 02/06/15 0621  Weight: 85.2 kg (187 lb 13.3 oz) 81.3 kg (179 lb 3.7 oz) 81.2 kg (179 lb 0.2 oz)    Exam: General: Alert, awake, oriented x3, in no acute distress.  HEENT: No bruits, no goiter.  Heart: Regular rate and rhythm. 2+ pitting lower ext edema. Lungs: Good air movement, clear Abdomen: Soft, nontender, nondistended, positive bowel sounds.  Neuro: Grossly intact, nonfocal. Psych: normal mood and affect  Data Reviewed: Basic Metabolic Panel:  Recent Labs Lab 01/31/15 0429 02/02/15 0002 02/04/15 0004 02/05/15 0345 02/06/15  NA 142 143 143 140 140  K 3.8 3.7 3.3* 4.4 4.3  CL 112* 110 110 107 106  CO2 21* GLUCOSE 170* 152* 147* 183* 225*  BUN 27* 23* 20 20 21*  CREATININE 1.69* 1.53* 1.35* 1.29* 1.35*  CALCIUM 7.2* 7.1* 7.1* 7.2* 7.4*   Liver Function Tests:  Recent Labs Lab 01/31/15 0429  AST 63*  ALT 47  ALKPHOS 78  BILITOT 1.0  PROT 5.4*  ALBUMIN 1.5*   CBC:  Recent Labs Lab 01/31/15 0429 02/02/15 0002 02/04/15 0004 02/04/15 1725 02/06/15 0237  WBC 10.2 8.2 6.2 6.8 6.4  HGB 9.4* 8.6* 7.9* 9.6* 9.2*  HCT 29.8* 26.5* 24.8* 29.7* 29.3*  MCV 92.0 89.2 89.9 87.6 89.3  PLT 197 177 163 181 165    CBG:  Recent Labs Lab 02/05/15 1208 02/05/15 1608 02/05/15 2203 02/06/15 0620 02/06/15 1145  GLUCAP 147* 168* 177* 197* 212*    Recent Results (from the past 240 hour(s))  Urine culture     Status: None   Collection Time: 01/28/15  6:26 PM  Result Value Ref Range Status   Specimen Description URINE, CLEAN CATCH  Final   Special Requests Normal  Final   Colony Count NO GROWTH Performed at [facility]   Final   Culture NO GROWTH Performed at [facility]   Final   Report Status 01/29/2015 FINAL  Final  Clostridium Difficile by PCR     Status: None   Collection Time: 02/01/15   5:16 AM  Result Value Ref Range Status   C difficile by pcr NEGATIVE NEGATIVE Final     Studies: Dg Esophagus  02/06/2015   CLINICAL DATA:  74 year old male with history of dysphagia.  EXAM: ESOPHOGRAM/BARIUM SWALLOW  TECHNIQUE: Combined double contrast and single contrast examination performed using effervescent crystals, thick barium liquid, and thin barium liquid.  FLUOROSCOPY TIME:  If the device does not provide the exposure index:  Fluoroscopy Time:  12 seconds  Number of Acquired Images:  3  COMPARISON:  No priors.  FINDINGS: A double contrast esophagram was attempted, however, after the first swallow attempts, gross aspiration of material was noted with barium extending beyond the carina into the left and right mainstem bronchi. The examination was terminated at that time. The visualized portions of the esophagus were grossly normal during this very limited examination.  IMPRESSION: Extremely limited study terminated after gross aspiration was observed, as above.  These results will be called to the ordering clinician or representative by the Radiologist Assistant, and communication documented in the PACS or zVision Dashboard.   Electronically Signed   By: Trudie Reed M.D.   On: 02/06/2015 11:43    Scheduled Meds: . carvedilol  6.25 mg Oral BID WC  . feeding supplement (ENSURE ENLIVE)  237 mL Oral TID BM  . furosemide  40 mg Oral Daily  . insulin aspart  0-20 Units Subcutaneous TID WC  . insulin glargine  10 Units Subcutaneous BID  . latanoprost  1 drop Both Eyes QHS  . pantoprazole sodium  40 mg Oral BID  . saccharomyces boulardii  250 mg Oral BID  . sucralfate  1 g Oral 4 times per day   Continuous Infusions:    Time Spent: 35 min for patient evaluation, discussions bedside with patient and family, extensive chart review for past 24 days   Pamella Pert  [facility] Pager [phone]. If 7PM-7AM, please contact night-coverage at [email or web address], password [facility] 02/06/2015,  1:58 PM  LOS: 24 days

## 2015-02-06 NOTE — Progress Notes (Signed)
Please see Dr Regino Schultze post EGD recommendations.

## 2015-02-06 NOTE — Evaluation (Signed)
Clinical/Bedside Swallow Evaluation Patient Details  Name: [name] [name] MRN: [id number] Date of Birth: 11/17/1940  Today's Date: 02/06/2015 Time: SLP Start Time (ACUTE ONLY): 1400 SLP Stop Time (ACUTE ONLY): 1415 SLP Time Calculation (min) (ACUTE ONLY): 15 min  Past Medical History:  Past Medical History  Diagnosis Date  . Hypertension   . Diabetes mellitus type 2 in obese   . Cholelithiasis    Past Surgical History:  Past Surgical History  Procedure Laterality Date  . Esophagogastroduodenoscopy N/A 01/21/2015    Procedure: ESOPHAGOGASTRODUODENOSCOPY (EGD);  Surgeon: Hilarie Fredrickson, MD;  Location: [facility] ENDOSCOPY;  Service: Endoscopy;  Laterality: N/A;  to be done at BEDSIDE  . Cardiac catheterization N/A 01/17/2015    Procedure: Left Heart Cath and Coronary Angiography;  Surgeon: Lyn Records, MD;  Location: [facility] INVASIVE CV LAB;  Service: Cardiovascular;  Laterality: N/A;  . Esophagogastroduodenoscopy N/A 02/05/2015    Procedure: ESOPHAGOGASTRODUODENOSCOPY (EGD);  Surgeon: Hart Carwin, MD;  Location: [facility] ENDOSCOPY;  Service: Endoscopy;  Laterality: N/A;   HPI:  74 y/o M admitted 5/8 after a syncopal episode and found to have acute pancreatitis. Also positive troponin/NSTEMI. EGD shows grade 4 esophagitis with deep ulcers, eschar, friability, blood clots in stomach. Intubated from 5/16 to 5/18. Followed by SLP services from  5/19-5/30 for dysphagia.  D/Cd from our services after demonstrating functional gains due to improved coordination, conditioning, and use of compensatory strategies for safe eating.   (MBS 5/19 with no noted aspiration but moderate weakness.)  Repeat EGD 5/31 with grade 3 esophagitis.  Barium swallow 6/1 limited due to frank aspiration of initial bolus.  Repeat swallow eval ordered.      Assessment / Plan / Recommendation Clinical Impression  Pt presents with functional swallow - he reports aspiration event this morning during barium swallow.  This was likely due to  instructions requiring that he consume large, consecutive boluses of thin liquid, a condition that is known to elicit aspiration in this pt.  When pt consumes small, controlled bolus sizes, he is able to protect his airway.  Clinical reassessment during lunch today revealed functional toleration of regular diet with thin liquids.  Pt is independent with precautions as posted at [facility].  He understands the necessity of continuing their practice given aspiration event this am.  Recommend resuming regular diet, thin liquids; continue meds whole in puree.  No further SLP intervention is needed.  Will sign off.      Aspiration Risk  Mild    Diet Recommendation Thin (regular solids)   Medication Administration: Whole meds with puree Compensations: Small sips/bites;Slow rate;Multiple dry swallows after each bite/sip;Follow solids with liquid    Other  Recommendations Oral Care Recommendations: Oral care BID       Swallow Study Prior Functional Status       General Other Pertinent Information: 74 y/o M admitted 5/8 after a syncopal episode and found to have acute pancreatitis. Also positive troponin/NSTEMI. EGD shows grade 4 esophagitis with deep ulcers, eschar, friability, blood clots in stomach. Intubated from 5/16 to 5/18. Followed by SLP services from  5/19-5/30 for dysphagia.  D/Cd from our services after demonstrating functional gains due to improved coordination, conditioning, and use of compensatory strategies for safe eating.   (MBS 5/19 with no noted aspiration but moderate weakness.)  Repeat EGD 5/31 with grade 3 esophagitis.  Barium swallow 6/1 limited due to frank aspiration of initial bolus.  Repeat swallow eval ordered.    Type of Study: Bedside swallow evaluation  Previous Swallow Assessment: see history Diet Prior to this Study: NPO Temperature Spikes Noted: No Respiratory Status: Room air History of Recent Intubation: Yes Length of Intubations (days): 3 days Date extubated:  01/23/15 Behavior/Cognition: Alert;Cooperative;Pleasant mood Oral Cavity - Dentition: Adequate natural dentition/normal for age Self-Feeding Abilities: Able to feed self Patient Positioning: Upright in chair/Tumbleform Baseline Vocal Quality: Normal Volitional Cough: Strong Volitional Swallow: Able to elicit    Oral/Motor/Sensory Function Overall Oral Motor/Sensory Function: Appears within functional limits for tasks assessed   Ice Chips Ice chips: Not tested   Thin Liquid Thin Liquid: Within functional limits Presentation: Cup;Straw    Nectar Thick Nectar Thick Liquid: Not tested   Honey Thick Honey Thick Liquid: Not tested   Puree Puree: Within functional limits   Solid  Moyses Pavey L. [place], [place] CCC/SLP Pager [phone]     Solid: Within functional limits Presentation: Self Fed       Blenda Mounts Laurice 02/06/2015,2:25 PM

## 2015-02-06 NOTE — Progress Notes (Addendum)
Nutrition Consult/Follow-Up  DOCUMENTATION CODES:  Not applicable  INTERVENTION:  Boost Breeze po BID, each supplement provides 250 kcal and 9 grams of protein  Prostat liquid protein po 30 ml daily, each supplement provides 100 kcal, 15 grams protein  NUTRITION DIAGNOSIS:  Inadequate oral intake related to  (limited appetite) as evidenced by meal completion < 50%, ongoing  GOAL:  Patient will meet greater than or equal to 90% of their needs, progressing  MONITOR:  PO intake, Supplement acceptance, Labs, Weight trends, Skin, I & O's  ASSESSMENT: Admitted 5/8 after a syncopal episode and found to have acute pancreatitis. Also positive troponin/NSTEMI  Transferred to ICU with acute respiratory distress and intubated. EDG shows grade 4 esophagitis with deep ulcers, eschar, friability. OGT left out. No plans for feeding tube.   Pt s/p bedside swallow evaluation today.  Presented with functional swallow.  RD spoke with pt and pt's daughter at bedside.  Pt reports his appetite is OK.  PO intake is variable at 0-50% per flowsheet records.  Does not like Ensure Enlive -- gives him an upset stomach and diarrhea.  Amenable to peach Boost Breeze and Prostat liquid protein.  RD to order.   Cardiology note reviewed.  Plan is for CABG once his nutritional status and medical problems are improved.   Height:  Ht Readings from Last 1 Encounters:  01/19/15 6' (1.829 m)    Weight:  Wt Readings from Last 1 Encounters:  02/06/15 179 lb 0.2 oz (81.2 kg)    Ideal Body Weight:  80.9 kg  Wt Readings from Last 10 Encounters:  02/06/15 179 lb 0.2 oz (81.2 kg)    BMI:  Body mass index is 24.27 kg/(m^2).  Estimated Nutritional Needs:  Kcal:  2050-2250  Protein:  100-110 grams  Fluid:  2.0-2.2 L  Skin:  Reviewed, no issues  Diet Order:  Diet Carb Modified Fluid consistency:: Thin; Room service appropriate?: Yes  EDUCATION NEEDS:  No education needs identified at this  time   Intake/Output Summary (Last 24 hours) at 02/06/15 1550 Last data filed at 02/05/15 2208  Gross per 24 hour  Intake    237 ml  Output   1051 ml  Net   -814 ml    Last BM:  5/31  Maureen Chatters, RD, LDN Pager #: [phone] After-Hours Pager #: [phone]

## 2015-02-07 ENCOUNTER — Inpatient Hospital Stay (HOSPITAL_COMMUNITY): Payer: Medicare Other

## 2015-02-07 ENCOUNTER — Encounter: Payer: Self-pay | Admitting: Internal Medicine

## 2015-02-07 ENCOUNTER — Other Ambulatory Visit (HOSPITAL_COMMUNITY): Payer: Medicare Other

## 2015-02-07 DIAGNOSIS — K2100 Gastro-esophageal reflux disease with esophagitis, without bleeding: Secondary | ICD-10-CM | POA: Insufficient documentation

## 2015-02-07 DIAGNOSIS — Z01810 Encounter for preprocedural cardiovascular examination: Secondary | ICD-10-CM

## 2015-02-07 LAB — COMPREHENSIVE METABOLIC PANEL
ALBUMIN: 1.6 g/dL — AB (ref 3.5–5.0)
ALK PHOS: 67 U/L (ref 38–126)
ALT: 36 U/L (ref 17–63)
AST: 40 U/L (ref 15–41)
Anion gap: 9 (ref 5–15)
BUN: 18 mg/dL (ref 6–20)
CALCIUM: 7.3 mg/dL — AB (ref 8.9–10.3)
CHLORIDE: 103 mmol/L (ref 101–111)
CO2: 27 mmol/L (ref 22–32)
CREATININE: 1.35 mg/dL — AB (ref 0.61–1.24)
GFR calc non Af Amer: 51 mL/min — ABNORMAL LOW (ref >60)
GFR, EST AFRICAN AMERICAN: 59 mL/min — AB (ref >60)
Glucose, Bld: 153 mg/dL — ABNORMAL HIGH (ref 65–99)
Potassium: 4 mmol/L (ref 3.5–5.1)
Sodium: 139 mmol/L (ref 135–145)
Total Bilirubin: 0.6 mg/dL (ref 0.3–1.2)
Total Protein: 5.3 g/dL — ABNORMAL LOW (ref 6.5–8.1)

## 2015-02-07 LAB — GLUCOSE, CAPILLARY
Glucose-Capillary: 169 mg/dL — ABNORMAL HIGH (ref 65–99)
Glucose-Capillary: 216 mg/dL — ABNORMAL HIGH (ref 65–99)
Glucose-Capillary: 203 mg/dL — ABNORMAL HIGH (ref 65–99)
Glucose-Capillary: 149 mg/dL — ABNORMAL HIGH (ref 65–99)

## 2015-02-07 LAB — CBC
HEMATOCRIT: 28.6 % — AB (ref 39.0–52.0)
Hemoglobin: 9 g/dL — ABNORMAL LOW (ref 13.0–17.0)
MCH: 28.1 pg (ref 26.0–34.0)
MCHC: 31.5 g/dL (ref 30.0–36.0)
MCV: 89.4 fL (ref 78.0–100.0)
Platelets: 150 10*3/uL (ref 150–400)
RBC: 3.2 MIL/uL — ABNORMAL LOW (ref 4.22–5.81)
RDW: 14.5 % (ref 11.5–15.5)
WBC: 7.7 10*3/uL (ref 4.0–10.5)

## 2015-02-07 MED ORDER — FUROSEMIDE 10 MG/ML IJ SOLN
40.0000 mg | Freq: Every day | INTRAMUSCULAR | Status: DC
  Administered 2015-02-07 – 2015-02-13 (×7): 40 mg via INTRAVENOUS
  Filled 2015-02-07 (×8): qty 4

## 2015-02-07 NOTE — Progress Notes (Addendum)
Daily Rounding Note  02/07/2015, 8:05 AM  LOS: 25 days   SUBJECTIVE:       No complaints.    OBJECTIVE:         Vital signs in last 24 hours:    Temp:  [97.6 F (36.4 C)-98.3 F (36.8 C)] 98.3 F (36.8 C) (06/02 0617) Pulse Rate:  [77-80] 77 (06/02 0617) Resp:  [17-18] 18 (06/02 0617) BP: (110-113)/(46-55) 110/46 mmHg (06/02 0617) SpO2:  [95 %-96 %] 95 % (06/02 0617) Weight:  [179 lb 0.2 oz (81.2 kg)] 179 lb 0.2 oz (81.2 kg) (06/02 0617) Last BM Date: 02/06/15 Filed Weights   02/05/15 0401 02/06/15 0621 02/07/15 0617  Weight: 179 lb 3.7 oz (81.3 kg) 179 lb 0.2 oz (81.2 kg) 179 lb 0.2 oz (81.2 kg)   General: looks better  Did not reexamine. No resp distress.  SR on monitor.    Intake/Output from previous day: 06/01 0701 - 06/02 0700 In: 240 [P.O.:240] Out: 920 [Urine:920]  Intake/Output this shift:    Lab Results:  Recent Labs  02/04/15 1725 02/06/15 0237 02/07/15 0520  WBC 6.8 6.4 7.7  HGB 9.6* 9.2* 9.0*  HCT 29.7* 29.3* 28.6*  PLT 181 165 150   BMET  Recent Labs  02/05/15 0345 02/06/15 02/07/15 0520  NA 140 140 139  K 4.4 4.3 4.0  CL 107 106 103  CO2 GLUCOSE 183* 225* 153*  BUN 20 21* 18  CREATININE 1.29* 1.35* 1.35*  CALCIUM 7.2* 7.4* 7.3*   LFT  Recent Labs  02/07/15 0520  PROT 5.3*  ALBUMIN 1.6*  AST 40  ALT 36  ALKPHOS 67  BILITOT 0.6   PT/INR No results for input(s): LABPROT, INR in the last 72 hours. Hepatitis Panel No results for input(s): HEPBSAG, HCVAB, HEPAIGM, HEPBIGM in the last 72 hours.  Studies/Results: Dg Esophagus  02/06/2015   CLINICAL DATA:  74 year old male with history of dysphagia.  EXAM: ESOPHOGRAM/BARIUM SWALLOW  TECHNIQUE: Combined double contrast and single contrast examination performed using effervescent crystals, thick barium liquid, and thin barium liquid.  FLUOROSCOPY TIME:  If the device does not provide the exposure index:   Fluoroscopy Time:  12 seconds  Number of Acquired Images:  3  COMPARISON:  No priors.  FINDINGS: A double contrast esophagram was attempted, however, after the first swallow attempts, gross aspiration of material was noted with barium extending beyond the carina into the left and right mainstem bronchi. The examination was terminated at that time. The visualized portions of the esophagus were grossly normal during this very limited examination.  IMPRESSION: Extremely limited study terminated after gross aspiration was observed, as above.  These results will be called to the ordering clinician or representative by the Radiologist Assistant, and communication documented in the PACS or zVision Dashboard.   Electronically Signed   By: Trudie Reed M.D.   On: 02/06/2015 11:43   Scheduled Meds: . carvedilol  6.25 mg Oral BID WC  . feeding supplement (PRO-STAT SUGAR FREE 64)  30 mL Oral Q supper  . feeding supplement (RESOURCE BREEZE)  1 Container Oral BID BM  . furosemide  40 mg Oral Daily  . insulin aspart  0-20 Units Subcutaneous TID WC  . insulin glargine  10 Units Subcutaneous BID  . latanoprost  1 drop Both Eyes QHS  . pantoprazole sodium  40 mg Oral BID  . saccharomyces boulardii  250 mg Oral BID  .  sucralfate  1 g Oral 4 times per day   Continuous Infusions:  PRN Meds:.acetaminophen, bisacodyl, fentaNYL (SUBLIMAZE) injection, ondansetron (ZOFRAN) IV   ASSESMENT:   * Severe reflux esophagitis causing UGIB.   Cg emesis, EGD 5/16 #1: grade 4 esophagitis. tereated with high dose PPI.  EGD # 2 5/31: grade 3 esophagitis, still with fribility, bleeding, nodularity. Still having melenic stool Suggested anticoagulation remain on hold. Carafate added 5/31.   *  Aspiration per esophagram.  Repeatedly normal BSS, latest 6/1: "Pt presents with functional swallow - he reports aspiration event this morning during barium swallow. This was likely due to instructions requiring that he consume  large, consecutive boluses of thin liquid, a condition that is known to elicit aspiration in this pt. When pt consumes small, controlled bolus sizes, he is able to protect his airway. Clinical reassessment during lunch today revealed functional toleration of regular diet with thin liquids."   * ABL anemia. Transfusion PRBC x 6 total for this admit. Single PRBC on 5/30.   *  Protein malnutrition.  Taking < 50% of needed calories.     * Coagulopathy. INR 1.5, PT 18.5 on 5/16.  Only anticoagulation was Lovenox, discontinued. Last dose on 5/30   * Acute biliary pancreatitis on admission 5/8.  Resolved.   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 CABG postponed to allow for medical recovery. Now set for 6/9  * DVT. S/p IVC filter.   * AKI/CKD.   * IDDM    PLAN   *  Swallowing precautions outlined by SLP.   *  ? When can he start [facility], and does he need prestart EGD?  *  CABG now set for 6/9  *  GI will sign off, call if questions.     Jennye Moccasin  02/07/2015, 8:05 AM Pager: [phone]. Attending MD note:   I have taken a history, examined the patient, and reviewed the chart. I agree with the Advanced Practitioner's impression and recommendations.  He will not need repeat EGD before  heart  Surgery.next week. Will keep on Carafate and Pantoprazole, and mainly- strict antireflux measure s after the surgery - will need HOB elevation and watch for aspiration. 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  W[facility] Pager # [phone]

## 2015-02-07 NOTE — Progress Notes (Signed)
VASCULAR LAB PRELIMINARY  PRELIMINARY  PRELIMINARY  PRELIMINARY  Right Lower Extremity Vein Map    Right Great Saphenous Vein   Segment Diameter Comment  1. Origin 3.10mm   2. High Thigh 2.63mm   3. Mid Thigh 1.68mm   4. Low Thigh mm Unable to follow  5. At Knee 2.39mm   6. High Calf 2.15mm   7. Low Calf 2.32mm   8. Ankle 2.45mm       Left Lower Extremity Vein Map    Left Great Saphenous Vein   Segment Diameter Comment  1. Origin 3.41mm   2. High Thigh 2.39mm Appears thickened  3. Mid Thigh 2.27mm Non compressible  4. Low Thigh mm Unable to follow due to edema  5. At Knee mm Unable to follow  6. High Calf 1.45mm Walls thickened  7. Low Calf 1.53mm   8. Ankle 2.59mm    Technically difficult due to moderate edema throughout.  Kalli Greenfield, RVS 02/07/2015, 4:13 PM

## 2015-02-07 NOTE — Progress Notes (Signed)
CARDIAC REHAB PHASE I   PRE:  Rate/Rhythm: 86 SR PACs, PVCs  BP:  Supine:   Sitting: 116/56  Standing:    SaO2: 97-99%RA  MODE:  Ambulation: 550 ft   POST:  Rate/Rhythm: 89   BP:  Supine:   Sitting: 127/59  Standing:    SaO2: 99%RA 1048-1118 Pt walked 550 ft on RA with rolling walker with very slow pace. Tolerated well. Did not need to stop and rest. Offered to walk farther but stated distance good for right now. To recliner with call bell. No CP.   Luetta Nutting, RN BSN  02/07/2015 11:15 AM

## 2015-02-07 NOTE — Progress Notes (Signed)
2 Days Post-Op Procedure(s) (LRB): ESOPHAGOGASTRODUODENOSCOPY (EGD) (N/A) Subjective: Patient progressing Will follow pre albumin level and sched for CABG next week June 9  Objective: Vital signs in last 24 hours: Temp:  [97.6 F (36.4 C)-98.3 F (36.8 C)] 98.3 F (36.8 C) (06/02 0617) Pulse Rate:  [77-80] 77 (06/02 0617) Cardiac Rhythm:  [-] Normal sinus rhythm (06/01 1900) Resp:  [17-18] 18 (06/02 0617) BP: (110-113)/(46-55) 110/46 mmHg (06/02 0617) SpO2:  [95 %-96 %] 95 % (06/02 0617) Weight:  [179 lb 0.2 oz (81.2 kg)] 179 lb 0.2 oz (81.2 kg) (06/02 0617)  Hemodynamic parameters for last 24 hours:  nsr  Intake/Output from previous day: 06/01 0701 - 06/02 0700 In: 240 [P.O.:240] Out: 920 [Urine:920] Intake/Output this shift:      Lab Results:  Recent Labs  02/06/15 0237 02/07/15 0520  WBC 6.4 7.7  HGB 9.2* 9.0*  HCT 29.3* 28.6*  PLT 165 150   BMET:  Recent Labs  02/06/15 02/07/15 0520  NA 140 139  K 4.3 4.0  CL 106 103  CO2 23 27  GLUCOSE 225* 153*  BUN 21* 18  CREATININE 1.35* 1.35*  CALCIUM 7.4* 7.3*    PT/INR: No results for input(s): LABPROT, INR in the last 72 hours. ABG    Component Value Date/Time   PHART 7.440 01/22/2015 0325   HCO3 17.6* 01/22/2015 0325   TCO2 18.4 01/22/2015 0325   ACIDBASEDEF 5.8* 01/22/2015 0325   O2SAT 58.4 01/23/2015 1105   CBG (last 3)   Recent Labs  02/06/15 1642 02/06/15 2223 02/07/15 0626  GLUCAP 200* 175* 169*    Assessment/Plan: S/P Procedure(s) (LRB): ESOPHAGOGASTRODUODENOSCOPY (EGD) (N/A) Check pre cabg dopplers, saph vein mapping and PFTs   LOS: 25 days    Kathlee Nations Trigt III 02/07/2015

## 2015-02-07 NOTE — Progress Notes (Signed)
[facility] PROGRESS NOTE Interim History: 74 year old male who presented to [facility] hospital for syncopal episode and bradycardia which was later attribute to vasovagal response. Would have persisting epigastric pain with nausea. In the ED at [facility] a CT scan of the abdomen and pelvis show very stone lipase was greater than 300 LFTs were elevated lactic acid of 2.2 with a creatinine 1.7 she was transferred to  [facility] hospital. 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 CT scan of the abdomen and pelvis showed edema around the pancreas compatible with pancreatitis no abscess choledocholithiasis with stone in common bile duct and pleural effusion. Started having black stool and found to be in hemorrhagic shock and had to be transfused several unit of packed red blood cells. Transferred to the ICU for acute respiratory distress intubated and PCCM took over with a chest x-ray which revealed bilateral consolidation. An EGD was performed that showed a grade 4 esophagitis with deep ulcers and friable mucosa. Anticoagulants were held along with aspirin. He is only on Lovenox for DVT prophylaxis. Chest x-ray on 518 showed improving aeration and  was extubated. Lower extremity Doppler done on 01/24/2015 showed a bilateral lower extremity DVTs, IVC filter was placed.  HPI/Subjective: - had barium swallow this morning, severe cough appearing that he is aspirating - no chest pain, shortness of breath, no abdominal pain, nausea or vomiting.   Assessment/Plan:  NSTEMI with severe triple-vessel disease/chronic systolic heart failure/ischemic cardiomyopathy: - Cardiac cath revealed severe triple-vessel disease, Cardiology and TCTS suggest the patient not leave the  hospital though patient will need to fully recover from his severe acute pancreatitis prior to surgery. - Continue Coreg. Further titration per cardiology. - His Cr cont to improved ? If he might benfeit from ACE-I. - keep Hb > 8, stable today  - Restrict his fluid intake monitor strict I's and O's. - plan for CABG next week, keep inpatient until then   Hemorrhagic shock/acute blood loss anemia due to erosive esophagitis: - EGD done showed esophagitis, hemorraghic shock due to esophagitis plus anti-coagulation, now resolved status post 5 units of packed red blood cells. Keep hemoglobin closer to 9.  - repeated EGD on 5.31.2016 showed healing ulcer. GI recommended to add carafate. - Continue PPI twice a day. - ferritin high, B12 high  Acute gallstone pancreatitis: - MRCP suggested the possibility of a 4 mm stone within the common bile duct. - GI was consulted which recommended laparoscopic cholecystectomy with intraoperative cholangiogram. - Surgery rec cholecystectomy as an outpatient. - He follow-up with surgery as an outpatient after surgery triple-vessel disease problem resolved.  Acute bilateral lower extremity DVT: - IVC filter was placed. - will not be a candidate for anticoagulation due to GI bleeding for the next 2 weeks at least per GI  Ileus: - Resolved with conservative management. - with intermittent diarrhea, C diff negative x 2  Acute kidney injury: - Renal ultrasound showed no acute findings.  - continue Lasix   Hypocalcemia: - Corrected calcium is actually 9. No further interventions.  Persisting hypokalemia: - stable today   Hypophosphatemia: - Has normalized with supplementation.  Syncopal episode most likely vasovagal: - No further episodes.  Bradycardia: - Felt to be vaguely mediated no resolved.  Thrombocytopenia  - HIT panel negative - suspect due to acute inflam illness -  no evidence of acute blood loss - platelet count has  normalized  Hypotension > hypertension - Hypotension resolved with volume expansion - blood pressure controlled  DM - Blood glucose well controlled continue Lantus plus sliding scale insulin.   Code Status: FULL Family Communication: discussed with his wife and daughter bedside Disposition Plan: Insurance will not cover CIR - cardiac telemetry bed while awaiting CABG   Consultants:  [facility]  CVTS  Cardiology  Procedures: Significant Events: 5/08 Transfer from [place] after syncopal episode, gallstones, pancreatitis, elevated troponin I, abnormal echocardiogram 5/09 TTE: EF 30-40%, akinesis of basal-midinferior myocardium - severe hypokinesis lateral myocardium - grade 1 DD 5/12 LHC: severe 3 vessel disease  5/13 CT ABD/Pelvis: infiltration edema around pancreas, c/w acute pancreatitis, no abscess, cholelithiasis with stones in CBD, bilateral pleural effusions with compressive atx. 5/15 transfused one unit PRBCs 5/16 Transferred to ICU with acute respiratory distress. Intubated. PCCM consultation. CXR revealed BLL consolidation 5/16 transfused one unit PRBCs 5/16 EGD: grade 4 esophagitis with deep ulcers, eschar, friability, blood clllots in stomach, no gastric ulcers or mucosal abnormalities, mod edema of 2nd portion of duodenum c/w pancreatitis. PPI infusion initiated. Heparin disconitnued. OGT left out 5/17 transfused one unit PRBCs for Hgb 7.4 in setting of ACS 5/18 CXR with improved basilar aeration. Extubated 5/19 LE venous duplex scans + B LE DVT 5/19 IVC filter placed   Antibiotics: Zosyn 5/12 > 5/13 Vancomycin 5/16 > 5/19 Primaxin 5/16 > 5/25  Objective: Filed Vitals:   02/06/15 0621 02/06/15 2229 02/07/15 0617 02/07/15 1030  BP: 115/54 113/55 110/46 124/64  Pulse: 80 80 77 72  Temp: 98.7 F (37.1 C) 97.6 F (36.4 C) 98.3 F (36.8 C)   TempSrc: Oral Oral Oral   Resp: 17 17 18    Height:      Weight: 81.2 kg (179 lb 0.2 oz)  81.2 kg (179 lb 0.2  oz)   SpO2: 96% 96% 95%     Intake/Output Summary (Last 24 hours) at 02/07/15 1304 Last data filed at 02/07/15 0600  Gross per 24 hour  Intake    240 ml  Output    920 ml  Net   -680 ml   Filed Weights   02/05/15 0401 02/06/15 0621 02/07/15 0617  Weight: 81.3 kg (179 lb 3.7 oz) 81.2 kg (179 lb 0.2 oz) 81.2 kg (179 lb 0.2 oz)    Exam: General: Alert, awake, oriented x3, in no acute distress.  HEENT: No bruits, no goiter.  Heart: Regular rate and rhythm. 2+ pitting lower ext edema. Lungs: Good air movement, clear Abdomen: Soft, nontender, nondistended, positive bowel sounds.  Neuro: Grossly intact, nonfocal. Psych: normal mood and affect  Data Reviewed: Basic Metabolic Panel:  Recent Labs Lab 02/02/15 0002 02/04/15 0004 02/05/15 0345 02/06/15 02/07/15 0520  NA 143 143 140 140 139  K 3.7 3.3* 4.4 4.3 4.0  CL 110 110 107 106 103  CO2 22 25 25 23 27   GLUCOSE 152* 147* 183* 225* 153*  BUN 23* 20 20 21* 18  CREATININE 1.53* 1.35* 1.29* 1.35* 1.35*  CALCIUM 7.1* 7.1* 7.2* 7.4* 7.3*   Liver Function Tests:  Recent Labs Lab 02/07/15 0520  AST 40  ALT 36  ALKPHOS 67  BILITOT 0.6  PROT 5.3*  ALBUMIN 1.6*   CBC:  Recent Labs Lab 02/02/15 0002 02/04/15 0004 02/04/15 1725 02/06/15 0237 02/07/15 0520  WBC 8.2 6.2 6.8 6.4 7.7  HGB 8.6* 7.9* 9.6* 9.2* 9.0*  HCT 26.5* 24.8* 29.7* 29.3*  28.6*  MCV 89.2 89.9 87.6 89.3 89.4  PLT 177 163 181 165 150    CBG:  Recent Labs Lab 02/06/15 1145 02/06/15 1642 02/06/15 2223 02/07/15 0626 02/07/15 1253  GLUCAP 212* 200* 175* 169* 216*    Recent Results (from the past 240 hour(s))  Urine culture     Status: None   Collection Time: 01/28/15  6:26 PM  Result Value Ref Range Status   Specimen Description URINE, CLEAN CATCH  Final   Special Requests Normal  Final   Colony Count NO GROWTH Performed at [facility]   Final   Culture NO GROWTH Performed at [facility]   Final   Report Status  01/29/2015 FINAL  Final  Clostridium Difficile by PCR     Status: None   Collection Time: 02/01/15  5:16 AM  Result Value Ref Range Status   C difficile by pcr NEGATIVE NEGATIVE Final     Studies: Dg Esophagus  02/06/2015   CLINICAL DATA:  74 year old male with history of dysphagia.  EXAM: ESOPHOGRAM/BARIUM SWALLOW  TECHNIQUE: Combined double contrast and single contrast examination performed using effervescent crystals, thick barium liquid, and thin barium liquid.  FLUOROSCOPY TIME:  If the device does not provide the exposure index:  Fluoroscopy Time:  12 seconds  Number of Acquired Images:  3  COMPARISON:  No priors.  FINDINGS: A double contrast esophagram was attempted, however, after the first swallow attempts, gross aspiration of material was noted with barium extending beyond the carina into the left and right mainstem bronchi. The examination was terminated at that time. The visualized portions of the esophagus were grossly normal during this very limited examination.  IMPRESSION: Extremely limited study terminated after gross aspiration was observed, as above.  These results will be called to the ordering clinician or representative by the Radiologist Assistant, and communication documented in the PACS or zVision Dashboard.   Electronically Signed   By: Trudie Reed M.D.   On: 02/06/2015 11:43    Scheduled Meds: . carvedilol  6.25 mg Oral BID WC  . feeding supplement (PRO-STAT SUGAR FREE 64)  30 mL Oral Q supper  . feeding supplement (RESOURCE BREEZE)  1 Container Oral BID BM  . furosemide  40 mg Oral Daily  . insulin aspart  0-20 Units Subcutaneous TID WC  . insulin glargine  10 Units Subcutaneous BID  . latanoprost  1 drop Both Eyes QHS  . pantoprazole sodium  40 mg Oral BID  . saccharomyces boulardii  250 mg Oral BID  . sucralfate  1 g Oral 4 times per day   Continuous Infusions:    Time Spent: 15   Pamella Pert  [facility] Pager [phone]. If 7PM-7AM, please  contact night-coverage at [email or web address], password [facility] 02/07/2015, 1:04 PM  LOS: 25 days

## 2015-02-07 NOTE — Progress Notes (Signed)
VASCULAR LAB PRELIMINARY  PRELIMINARY  PRELIMINARY  PRELIMINARY  Pre-op Cardiac Surgery  Carotid Findings:  Right - 40% to 59% ICA stenosis. Left 60% to 79% ICA stenosis. Bilateral- Vertebral lower extrenity  Upper Extremity Right Left  Brachial Pressures 112 19   Radial Waveforms Biphasic Inaudible   Ulnar Waveforms Triphasic Triphasic  Palmar Arch (Allen's Test) Normal Abnormal   Findings:  Right Doppler waveforms remained normal with both radial and ulnar compressions, Left Palmar arch could not be evaluated due to inaudible radial artey and diminished signal in the palmar arch    Lower  Extremity Right Left  Anterior Tibial 74 Monophasic 70 Monohasic  Posterior Tibial 101 Triphasic 79 Triphasic  Ankle/Brachial Indices 0.85 0.66    Findings:  ABI on the right indicates normal arterial flow. Left ABI indicates a moderate reduction in arterial flow   [name] [name], RVS 02/07/2015, 4:00 PM

## 2015-02-07 NOTE — Progress Notes (Signed)
Subjective:  Up in chair eating lunch, no complaints  Objective:  Vital Signs in the last 24 hours: Temp:  [97.6 F (36.4 C)-98.3 F (36.8 C)] 98.3 F (36.8 C) (06/02 0617) Pulse Rate:  [72-80] 72 (06/02 1030) Resp:  [17-18] 18 (06/02 0617) BP: (110-124)/(46-64) 124/64 mmHg (06/02 1030) SpO2:  [95 %-96 %] 95 % (06/02 0617) Weight:  [179 lb 0.2 oz (81.2 kg)] 179 lb 0.2 oz (81.2 kg) (06/02 0617)  Intake/Output from previous day:  Intake/Output Summary (Last 24 hours) at 02/07/15 1217 Last data filed at 02/07/15 0600  Gross per 24 hour  Intake    240 ml  Output    920 ml  Net   -680 ml    Physical Exam: General appearance: alert, cooperative and no distress Lungs: clear to auscultation bilaterally Heart: regular rate and rhythm Skin: Skin color, texture, turgor normal. No rashes or lesions Neurologic: Grossly normal   Rate: 74  Rhythm: normal sinus rhythm and premature ventricular contractions (PVC)  Lab Results:  Recent Labs  02/06/15 0237 02/07/15 0520  WBC 6.4 7.7  HGB 9.2* 9.0*  PLT 165 150    Recent Labs  02/06/15 02/07/15 0520  NA 140 139  K 4.3 4.0  CL 106 103  CO2 23 27  GLUCOSE 225* 153*  BUN 21* 18  CREATININE 1.35* 1.35*   No results for input(s): TROPONINI in the last 72 hours.  Invalid input(s): CK, MB No results for input(s): INR in the last 72 hours.  Scheduled Meds: . carvedilol  6.25 mg Oral BID WC  . feeding supplement (PRO-STAT SUGAR FREE 64)  30 mL Oral Q supper  . feeding supplement (RESOURCE BREEZE)  1 Container Oral BID BM  . furosemide  40 mg Oral Daily  . insulin aspart  0-20 Units Subcutaneous TID WC  . insulin glargine  10 Units Subcutaneous BID  . latanoprost  1 drop Both Eyes QHS  . pantoprazole sodium  40 mg Oral BID  . saccharomyces boulardii  250 mg Oral BID  . sucralfate  1 g Oral 4 times per day   Continuous Infusions:  PRN Meds:.acetaminophen, bisacodyl, fentaNYL (SUBLIMAZE) injection, ondansetron  (ZOFRAN) IV   Imaging: Dg Esophagus  02/06/2015   CLINICAL DATA:  74 year old male with history of dysphagia.  EXAM: ESOPHOGRAM/BARIUM SWALLOW  TECHNIQUE: Combined double contrast and single contrast examination performed using effervescent crystals, thick barium liquid, and thin barium liquid.  FLUOROSCOPY TIME:  If the device does not provide the exposure index:  Fluoroscopy Time:  12 seconds  Number of Acquired Images:  3  COMPARISON:  No priors.  FINDINGS: A double contrast esophagram was attempted, however, after the first swallow attempts, gross aspiration of material was noted with barium extending beyond the carina into the left and right mainstem bronchi. The examination was terminated at that time. The visualized portions of the esophagus were grossly normal during this very limited examination.  IMPRESSION: Extremely limited study terminated after gross aspiration was observed, as above.  These results will be called to the ordering clinician or representative by the Radiologist Assistant, and communication documented in the PACS or zVision Dashboard.   Electronically Signed   By: Trudie Reed M.D.   On: 02/06/2015 11:43    Cardiac Studies:  Assessment/Plan:   Principal Problem:   Acute gallstone pancreatitis Active Problems:   NSTEMI (non-ST elevated myocardial infarction)   Acute systolic congestive heart failure   CAD, multiple vessel: CTO of RCA, Cx  with severe prox LAD disease   AKI (acute kidney injury)   Diabetes   Pancreatitis   Cardiomyopathy   Essential hypertension   Erosive esophagitis   HCAP (healthcare-associated pneumonia)   Syncope   Ileus   Acute biliary pancreatitis   Acute blood loss anemia   Hemorrhagic shock   Septic shock   Hematemesis with nausea   DVT (deep venous thrombosis)   PLAN: CABG 02/14/15, keep in hospital till then. Not on ASA secondary to GI bleed, not on statin secondary to acute liver injury with cholecystitis. Tolerating Coreg, also  on daily Lasix 40 mg.   Corine Shelter PA-C 02/07/2015, 12:17 PM [phone]  Patient seen, examined. Available data reviewed. Agree with findings, assessment, and plan as outlined by Corine Shelter, PA-C. Essentially unchanged and seems to be making slow clinical improvement. Recommend add IV lasix to see if we can mobilize fluid as he continues to have fairly marked edema. While hypoalbuminemia is certainly playing a major role, it is worthwhile to see if he will tolerate diuresis. Watch renal fxn closely.  Tonny Bollman, M.D. 02/07/2015 2:41 PM

## 2015-02-07 NOTE — Care Management Note (Addendum)
Case Management Note  Patient Details  Name: [name] [name] MRN: [id number] Date of Birth: 03/04/1941  Subjective/Objective:                    Action/Plan:   Expected Discharge Date:                  Expected Discharge Plan:  Skilled Nursing Facility  In-House Referral:  Clinical Social Work  Discharge planning Services     Post Acute Care Choice:    Choice offered to:     DME Arranged:    DME Agency:     HH Arranged:    HH Agency:     Status of Service:     Medicare Important Message Given:  Yes Date Medicare IM Given:  02/07/15 Medicare IM give by:  Raynald Blend Date Additional Medicare IM Given:  02/05/15 Additional Medicare Important Message give by:  Raynald Blend  If discussed at Long Length of Stay Meetings, dates discussed:  02/07/15  Additional Comments:  Cherylann Parr, RN 02/07/2015, 3:18 PM

## 2015-02-08 LAB — CBC
HCT: 30.1 % — ABNORMAL LOW (ref 39.0–52.0)
HEMOGLOBIN: 9.3 g/dL — AB (ref 13.0–17.0)
MCH: 27.7 pg (ref 26.0–34.0)
MCHC: 30.9 g/dL (ref 30.0–36.0)
MCV: 89.6 fL (ref 78.0–100.0)
Platelets: 150 10*3/uL (ref 150–400)
RBC: 3.36 MIL/uL — AB (ref 4.22–5.81)
RDW: 14.6 % (ref 11.5–15.5)
WBC: 7.8 10*3/uL (ref 4.0–10.5)

## 2015-02-08 LAB — GLUCOSE, CAPILLARY
GLUCOSE-CAPILLARY: 143 mg/dL — AB (ref 65–99)
Glucose-Capillary: 86 mg/dL (ref 65–99)
Glucose-Capillary: 169 mg/dL — ABNORMAL HIGH (ref 65–99)
Glucose-Capillary: 249 mg/dL — ABNORMAL HIGH (ref 65–99)

## 2015-02-08 LAB — COMPREHENSIVE METABOLIC PANEL
ALK PHOS: 71 U/L (ref 38–126)
ALT: 38 U/L (ref 17–63)
AST: 45 U/L — ABNORMAL HIGH (ref 15–41)
Albumin: 1.7 g/dL — ABNORMAL LOW (ref 3.5–5.0)
Anion gap: 10 (ref 5–15)
BILIRUBIN TOTAL: 0.7 mg/dL (ref 0.3–1.2)
BUN: 16 mg/dL (ref 6–20)
CALCIUM: 7.5 mg/dL — AB (ref 8.9–10.3)
CHLORIDE: 103 mmol/L (ref 101–111)
CO2: 28 mmol/L (ref 22–32)
Creatinine, Ser: 1.36 mg/dL — ABNORMAL HIGH (ref 0.61–1.24)
GFR calc Af Amer: 58 mL/min — ABNORMAL LOW (ref >60)
GFR calc non Af Amer: 50 mL/min — ABNORMAL LOW (ref >60)
GLUCOSE: 72 mg/dL (ref 65–99)
Potassium: 3.8 mmol/L (ref 3.5–5.1)
SODIUM: 141 mmol/L (ref 135–145)
TOTAL PROTEIN: 5.4 g/dL — AB (ref 6.5–8.1)

## 2015-02-08 LAB — PREALBUMIN: Prealbumin: 10.1 mg/dL — ABNORMAL LOW (ref 18–38)

## 2015-02-08 MED ORDER — PANTOPRAZOLE SODIUM 40 MG IV SOLR
40.0000 mg | Freq: Two times a day (BID) | INTRAVENOUS | Status: DC
  Administered 2015-02-08 – 2015-02-13 (×12): 40 mg via INTRAVENOUS
  Filled 2015-02-08 (×16): qty 40

## 2015-02-08 NOTE — Progress Notes (Signed)
CARDIAC REHAB PHASE I   PRE:  Rate/Rhythm: *87 SR  BP:  Sitting: 104/55        SaO2: 99 RA  MODE:  Ambulation: 550 ft   POST:  Rate/Rhythm: 86 SR  BP:  Sitting: 119/51         SaO2: 100 RA  Pt ambulated 550 ft on RA, rolling walker, assist x1, steady gait, moderate pace, tolerated well. Pt denies CP, dizziness, DOE. Pt states he hopes to have surgery next Thursday. Pt to recliner after walk, feet elevated, call bell within reach. Pt wife, daughter and granddaughter at bedside.   [phone]  Joylene Grapes, RN, BSN 02/08/2015 8:57 AM

## 2015-02-08 NOTE — Care Management Note (Signed)
Case Management Note  Patient Details  Name: Kenward Quinata MRN: [id number] Date of Birth: [birth year]  Subjective/Objective:                    Action/Plan:   Expected Discharge Date:                  Expected Discharge Plan:  Skilled Nursing Facility  In-House Referral:  Clinical Social Work  Discharge planning Services     Post Acute Care Choice:    Choice offered to:     DME Arranged:    DME Agency:     HH Arranged:    HH Agency:     Status of Service:     Medicare Important Message Given:  Yes Date Medicare IM Given:  02/07/15 Medicare IM give by:  Raynald Blend Date Additional Medicare IM Given:  02/08/15 Additional Medicare Important Message give by:  Raynald Blend  If discussed at Long Length of Stay Meetings, dates discussed:  02/07/15  Additional Comments:  Cherylann Parr, RN 02/08/2015, 11:14 AM

## 2015-02-08 NOTE — Progress Notes (Signed)
Physical Therapy Treatment Patient Details Name: [name] [name] MRN: [id number] DOB: 1941-06-05 Today's Date: 02/08/2015    History of Present Illness Admitted with epigastric pain significant; also with NSTEMI; Cardiac cath revealed total RCA, total prox cfx, severe prox LAD, 50% dz of dominant diagonal - Cards following - will need eventual CABG - TCTS now following - patient will need to fully recover from his severe acute pancreatitis prior to surgery. Increased WOB and GIB with pt transferred to ICU and intubated 5/16 to 5/18. 5/19 bil LE DVTs; s/p IVC    PT Comments    Pt progressing well. Daughter present and reports they have been doing all the exercises we reviewed. After ambulation without a device, educated on hip abdct in standing at sink vs sidelying for hip weakness. Began education re: sternal precautions with mobility (bed and transfers) and encouraged to practice prior to surgery scheduled for 6/9.    Follow Up Recommendations  Other (comment) (TBA post-CABG)     Equipment Recommendations   (TBA post-CABG)    Recommendations for Other Services       Precautions / Restrictions Precautions Precautions: Fall Precaution Comments: Fall risk greatly reduced with use of RW Restrictions Weight Bearing Restrictions: No    Mobility  Bed Mobility Overal bed mobility: Modified Independent Bed Mobility: Rolling;Sidelying to Sit Rolling: Independent Sidelying to sit: Modified independent (Device/Increase time) Supine to sit: Modified independent (Device/Increase time) Sit to supine: Modified independent (Device/Increase time)   General bed mobility comments: pt in/out of bed for supine exercises; used rail; verbally educated on sternal precautions he will have post-CABG and encouraged to practice between now and 6/9  Transfers Overall transfer level: Needs assistance Equipment used: Rolling walker (2 wheeled);None Transfers: Sit to/from Stand Sit to Stand:  Supervision;Min guard         General transfer comment: pt stood with RW with no physical assist, safe hand placement on chair, no balance loss; without RW min-guard for safety due to slight unsteadiness; educated on sternal precautions and use of hands on knees  Ambulation/Gait Ambulation/Gait assistance: Min assist Ambulation Distance (Feet): 310 Feet Assistive device: 1 person hand held assist Gait Pattern/deviations: Step-through pattern;Decreased stride length;Trendelenburg;Drifts right/left Gait velocity: decreased   General Gait Details: pt unsteady without use of RW; as he fatigued, trendelenburg gait became apparent with compensatory lateral flexion at waist; vc for incr velocity and incr stride length   Stairs            Wheelchair Mobility    Modified Rankin (Stroke Patients Only)       Balance             Standing balance-Leahy Scale: Poor                      Cognition Arousal/Alertness: Awake/alert Behavior During Therapy: WFL for tasks assessed/performed Overall Cognitive Status: Within Functional Limits for tasks assessed                      Exercises General Exercises - Lower Extremity Ankle Circles/Pumps:  (reminded to do every hour when awake) Hip ABduction/ADduction: AROM;Both;20 reps;Sidelying;Standing (40 reps total; vc for true abdct and not using hip flexors) Other Exercises Other Exercises: bridging x 10 with vc for knee separation; maintaining pelvis level and stable; 3 second hold at top; UEs crossed across chest    General Comments        Pertinent Vitals/Pain Pain Assessment: No/denies pain    Home Living  Prior Function            PT Goals (current goals can now be found in the care plan section) Acute Rehab PT Goals Patient Stated Goal: to recover well enough to have his CABG surgery Time For Goal Achievement: 02/11/15 Potential to Achieve Goals: Good Progress towards  PT goals: Progressing toward goals    Frequency  Min 3X/week    PT Plan Discharge plan needs to be updated    Co-evaluation             End of Session Equipment Utilized During Treatment: Gait belt Activity Tolerance: Patient tolerated treatment well Patient left: in chair;with call bell/phone within reach;with family/visitor present     Time: 0981-1914 PT Time Calculation (min) (ACUTE ONLY): 33 min  Charges:  $Gait Training: 8-22 mins $Therapeutic Exercise: 8-22 mins                    G Codes:      Lennell Shanks 03-03-15, 5:07 PM Pager [phone]

## 2015-02-08 NOTE — Progress Notes (Signed)
    Subjective:  States "I feel fine." No chest pain or shortness of breath. Notes his appetite is improving.  Objective:  Vital Signs in the last 24 hours: Temp:  [98.1 F (36.7 C)-98.7 F (37.1 C)] 98.7 F (37.1 C) (06/03 0400) Pulse Rate:  [72-84] 84 (06/03 0800) Resp:  [19-21] 19 (06/03 0400) BP: (104-124)/(50-82) 104/55 mmHg (06/03 0800) SpO2:  [97 %-100 %] 100 % (06/03 0400) Weight:  [176 lb (79.833 kg)] 176 lb (79.833 kg) (06/03 0400)  Intake/Output from previous day: 06/02 0701 - 06/03 0700 In: 240 [P.O.:240] Out: 1 [Stool:1]  Physical Exam: Pt is alert and oriented, NAD HEENT: normal Neck: JVP - normal Lungs: CTA bilaterally CV: RRR without murmur  Abd: soft, NT, Positive BS, no hepatomegaly Ext: 2+ pretibial edema bilaterally somewhat improved from yesterday, distal pulses intact and equal Skin: warm/dry no rash   Lab Results:  Recent Labs  02/07/15 0520 02/08/15 0540  WBC 7.7 7.8  HGB 9.0* 9.3*  PLT 150 150    Recent Labs  02/07/15 0520 02/08/15 0540  NA 139 141  K 4.0 3.8  CL 103 103  CO2 27 28  GLUCOSE 153* 72  BUN 18 16  CREATININE 1.35* 1.36*   No results for input(s): TROPONINI in the last 72 hours.  Invalid input(s): CK, MB  Assessment/Plan:  74 year old gentleman with a very complex and prolonged hospitalization. 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 He is scheduled for bypass surgery next Thursday. He is off of all antiplatelets therapy because of severe erosive esophagitis. He seems to have stabilized. He is doing well with physical therapy. His appetite is improved and he just finished his entire breakfast tray. I would recommend continuing IV Lasix as long as his blood pressure and renal function will tolerate. His renal function is stable this morning and he seems to be diuresing.   Tonny Bollman, M.D. 02/08/2015, 9:34 AM

## 2015-02-08 NOTE — Progress Notes (Signed)
[facility] PROGRESS NOTE Interim History: 74 year old male who presented to [facility] hospital for syncopal episode and bradycardia which was later attribute to vasovagal response. Would have persisting epigastric pain with nausea. In the ED at [facility] a CT scan of the abdomen and pelvis show very stone lipase was greater than 300 LFTs were elevated lactic acid of 2.2 with a creatinine 1.7 she was transferred to  [facility] hospital. 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 CT scan of the abdomen and pelvis showed edema around the pancreas compatible with pancreatitis no abscess choledocholithiasis with stone in common bile duct and pleural effusion. Started having black stool and found to be in hemorrhagic shock and had to be transfused several unit of packed red blood cells. Transferred to the ICU for acute respiratory distress intubated and PCCM took over with a chest x-ray which revealed bilateral consolidation. An EGD was performed that showed a grade 4 esophagitis with deep ulcers and friable mucosa. Anticoagulants were held along with aspirin. He is only on Lovenox for DVT prophylaxis. Chest x-ray on 518 showed improving aeration and  was extubated. Lower extremity Doppler done on 01/24/2015 showed a bilateral lower extremity DVTs, IVC filter was placed.  HPI/Subjective: - no complaints today, he is doing well - no cough/chest congestion, no fevers or chills  Assessment/Plan:  NSTEMI with severe triple-vessel disease/chronic systolic heart failure/ischemic cardiomyopathy: - Cardiac cath revealed severe triple-vessel disease, Cardiology and TCTS suggest the patient not leave the hospital though patient will need to fully recover from his severe  acute pancreatitis prior to surgery. - Continue Coreg. Further titration per cardiology. - His Cr cont to improved ? If he might benfeit from ACE-I. - keep Hb > 8, stable today  - Restrict his fluid intake monitor strict I's and O's. - plan for CABG next week, keep inpatient until then per TCV; nutritional status improving, his pre-albumin is 10  Hemorrhagic shock/acute blood loss anemia due to erosive esophagitis: - EGD done showed esophagitis, hemorraghic shock due to esophagitis plus anti-coagulation, now resolved status post 5 units of packed red blood cells. Keep hemoglobin closer to 9.  - repeated EGD on 5.31.2016 showed healing ulcer. GI recommended to add carafate. - Continue PPI twice a day. - ferritin high, B12 high - biopsy without evidence of malignancy, chronic reflux  Acute gallstone pancreatitis: - MRCP suggested the possibility of a 4 mm stone within the common bile duct. - GI was consulted which recommended laparoscopic cholecystectomy with intraoperative cholangiogram. - Surgery rec cholecystectomy as an outpatient. - He follow-up with surgery as an outpatient after surgery triple-vessel disease problem resolved.  Acute bilateral lower extremity DVT: - IVC filter was placed. - will not be a candidate for anticoagulation due to GI bleeding for the next 2 weeks at least per GI  Ileus: - Resolved with conservative management. - with intermittent diarrhea, C diff negative x 2  Acute kidney injury: - Renal ultrasound showed no acute findings.  - continue Lasix   Hypocalcemia: - Corrected calcium is actually 9. No further interventions.  Persisting hypokalemia: - stable today   Hypophosphatemia: - Has normalized with supplementation.  Syncopal episode most likely vasovagal: - No further episodes.  Bradycardia: - Felt to be vaguely mediated no resolved.  Thrombocytopenia  - HIT panel negative - suspect  due to acute inflam illness - no evidence of acute blood  loss - platelet count has normalized  Hypotension > hypertension - Hypotension resolved with volume expansion - blood pressure controlled  DM - Blood glucose well controlled continue Lantus plus sliding scale insulin.   Code Status: FULL Family Communication: discussed with his wife and daughter bedside Disposition Plan: Insurance will not cover CIR - cardiac telemetry bed while awaiting CABG   Consultants:  [facility]  CVTS  Cardiology  Procedures: Significant Events: 5/08 Transfer from [place] after syncopal episode, gallstones, pancreatitis, elevated troponin I, abnormal echocardiogram 5/09 TTE: EF 30-40%, akinesis of basal-midinferior myocardium - severe hypokinesis lateral myocardium - grade 1 DD 5/12 LHC: severe 3 vessel disease  5/13 CT ABD/Pelvis: infiltration edema around pancreas, c/w acute pancreatitis, no abscess, cholelithiasis with stones in CBD, bilateral pleural effusions with compressive atx. 5/15 transfused one unit PRBCs 5/16 Transferred to ICU with acute respiratory distress. Intubated. PCCM consultation. CXR revealed BLL consolidation 5/16 transfused one unit PRBCs 5/16 EGD: grade 4 esophagitis with deep ulcers, eschar, friability, blood clllots in stomach, no gastric ulcers or mucosal abnormalities, mod edema of 2nd portion of duodenum c/w pancreatitis. PPI infusion initiated. Heparin disconitnued. OGT left out 5/17 transfused one unit PRBCs for Hgb 7.4 in setting of ACS 5/18 CXR with improved basilar aeration. Extubated 5/19 LE venous duplex scans + B LE DVT 5/19 IVC filter placed   Antibiotics: Zosyn 5/12 > 5/13 Vancomycin 5/16 > 5/19 Primaxin 5/16 > 5/25  Objective: Filed Vitals:   02/07/15 1459 02/07/15 2030 02/08/15 0400 02/08/15 0800  BP: 116/53 117/50 123/82 104/55  Pulse: 78 74 78 84  Temp:  98.1 F (36.7 C) 98.7 F (37.1 C)   TempSrc:  Oral Oral   Resp: Height:      Weight:   79.833 kg (176 lb)   SpO2: 97% 98%  100%     Intake/Output Summary (Last 24 hours) at 02/08/15 1250 Last data filed at 02/08/15 0815  Gross per 24 hour  Intake    240 ml  Output      1 ml  Net    239 ml   Filed Weights   02/06/15 0621 02/07/15 0617 02/08/15 0400  Weight: 81.2 kg (179 lb 0.2 oz) 81.2 kg (179 lb 0.2 oz) 79.833 kg (176 lb)    Exam: General: Alert, awake, oriented x3, in no acute distress.  HEENT: No bruits, no goiter.  Heart: Regular rate and rhythm. 2+ pitting lower ext edema. Lungs: Good air movement, clear Abdomen: Soft, nontender, nondistended, positive bowel sounds.  Neuro: Grossly intact, nonfocal. Psych: normal mood and affect  Data Reviewed: Basic Metabolic Panel:  Recent Labs Lab 02/04/15 0004 02/05/15 0345 02/06/15 02/07/15 0520 02/08/15 0540  NA 143 140 140 139 141  K 3.3* 4.4 4.3 4.0 3.8  CL 110 107 106 103 103  CO2 GLUCOSE 147* 183* 225* 153* 72  BUN 20 20 21* 18 16  CREATININE 1.35* 1.29* 1.35* 1.35* 1.36*  CALCIUM 7.1* 7.2* 7.4* 7.3* 7.5*   Liver Function Tests:  Recent Labs Lab 02/07/15 0520 02/08/15 0540  AST 40 45*  ALT 36 38  ALKPHOS 67 71  BILITOT 0.6 0.7  PROT 5.3* 5.4*  ALBUMIN 1.6* 1.7*   CBC:  Recent Labs Lab 02/04/15 0004 02/04/15 1725 02/06/15 0237 02/07/15 0520 02/08/15 0540  WBC 6.2 6.8 6.4 7.7 7.8  HGB 7.9* 9.6* 9.2* 9.0* 9.3*  HCT 24.8* 29.7* 29.3* 28.6* 30.1*  MCV 89.9 87.6 89.3 89.4 89.6  PLT 163 181 165 150 150    CBG:  Recent Labs Lab 02/07/15 1253 02/07/15 1622 02/07/15 2128 02/08/15 0559 02/08/15 1111  GLUCAP 216* 203* 149* 86 143*    Recent Results (from the past 240 hour(s))  Clostridium Difficile by PCR     Status: None   Collection Time: 02/01/15  5:16 AM  Result Value Ref Range Status   C difficile by pcr NEGATIVE NEGATIVE Final     Studies: No results found.  Scheduled Meds: . carvedilol  6.25 mg Oral BID WC  . feeding supplement (PRO-STAT SUGAR FREE 64)  30 mL Oral Q supper  . feeding  supplement (RESOURCE BREEZE)  1 Container Oral BID BM  . furosemide  40 mg Intravenous Daily  . insulin aspart  0-20 Units Subcutaneous TID WC  . insulin glargine  10 Units Subcutaneous BID  . latanoprost  1 drop Both Eyes QHS  . pantoprazole (PROTONIX) IV  40 mg Intravenous Q12H  . saccharomyces boulardii  250 mg Oral BID  . sucralfate  1 g Oral 4 times per day   Continuous Infusions:     Pamella Pert  [facility] Pager [phone]. If 7PM-7AM, please contact night-coverage at [email or web address], password [facility] 02/08/2015, 12:50 PM  LOS: 26 days

## 2015-02-08 NOTE — Progress Notes (Signed)
UR Completed. [name] Bunyan, RN, BSN.  [phone] 

## 2015-02-09 LAB — GLUCOSE, CAPILLARY
GLUCOSE-CAPILLARY: 204 mg/dL — AB (ref 65–99)
Glucose-Capillary: 116 mg/dL — ABNORMAL HIGH (ref 65–99)
Glucose-Capillary: 183 mg/dL — ABNORMAL HIGH (ref 65–99)
Glucose-Capillary: 140 mg/dL — ABNORMAL HIGH (ref 65–99)

## 2015-02-09 NOTE — Progress Notes (Signed)
[facility] PROGRESS NOTE HPI/Subjective: - no chest pain, shortness of breath, no abdominal pain, nausea or vomiting.   Assessment/Plan:  NSTEMI with severe triple-vessel disease/chronic systolic heart failure/ischemic cardiomyopathy: - Cardiac cath revealed severe triple-vessel disease, Cardiology and TCTS suggest the patient not leave the hospital though patient will need to fully recover from his severe acute pancreatitis prior to surgery. - Continue Coreg. Further titration per cardiology. - His Cr cont to improved ? If he might benfeit from ACE-I. - keep Hb > 8, stable today  - Restrict his fluid intake monitor strict I's and O's. - plan for CABG next week, keep inpatient until then per TCV; nutritional status improving, his pre-albumin is 10  Hemorrhagic shock/acute blood loss anemia due to erosive esophagitis: - EGD done showed esophagitis, hemorraghic shock due to esophagitis plus anti-coagulation, now resolved status post 5 units of packed red blood cells. Keep hemoglobin closer to 9.  - repeated EGD on 5.31.2016 showed healing ulcer. GI recommended to add carafate. - Continue PPI twice a day. - ferritin high, B12 high - biopsy without evidence of malignancy, chronic reflux  Acute gallstone pancreatitis: - MRCP suggested the possibility of a 4 mm stone within the common bile duct. - GI was consulted which recommended laparoscopic cholecystectomy with intraoperative cholangiogram. - Surgery rec cholecystectomy as an outpatient. - He follow-up with surgery as an outpatient after surgery triple-vessel disease problem resolved.  Acute bilateral lower extremity DVT: - IVC filter was placed. - will not be a candidate for anticoagulation due to GI bleeding for the next 2 weeks at least per GI  Ileus: - Resolved with conservative management. - with intermittent diarrhea, C diff negative x 2  Acute kidney injury: - Renal ultrasound showed no acute findings.  -  continue Lasix   Hypocalcemia: - Corrected calcium is actually 9. No further interventions.  Persisting hypokalemia: - stable today   Hypophosphatemia: - Has normalized with supplementation.  Syncopal episode most likely vasovagal: - No further episodes.  Bradycardia: - Felt to be vaguely mediated no resolved.  Thrombocytopenia  - HIT panel negative - suspect due to acute inflam illness - no evidence of acute blood loss - platelet count has normalized  Hypotension > hypertension - Hypotension resolved with volume expansion - blood pressure controlled  DM - Blood glucose well controlled continue Lantus plus sliding scale insulin.   Code Status: FULL Family Communication: discussed with his wife and daughter bedside Disposition Plan: Insurance will not cover CIR - cardiac telemetry bed while awaiting CABG   Interim History: 74 year old male who presented to [facility] hospital for syncopal episode and bradycardia which was later attribute to vasovagal response. Would have persisting epigastric pain with nausea. In the ED at [facility] a CT scan of the abdomen and pelvis show very stone lipase was greater than 300 LFTs were elevated lactic acid of 2.2 with a creatinine 1.7 she was transferred to  [facility] hospital. 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 CT scan of the abdomen and pelvis showed edema around the pancreas compatible with pancreatitis no abscess choledocholithiasis with stone in common bile duct and pleural effusion. Started having black stool and found to be in hemorrhagic shock and had to be transfused several unit of packed red blood cells. Transferred to the ICU for acute respiratory  distress intubated and PCCM took over with a  chest x-ray which revealed bilateral consolidation. An EGD was performed that showed a grade 4 esophagitis with deep ulcers and friable mucosa. Anticoagulants were held along with aspirin. He is only on Lovenox for DVT prophylaxis. Chest x-ray on 518 showed improving aeration and  was extubated. Lower extremity Doppler done on 01/24/2015 showed a bilateral lower extremity DVTs, IVC filter was placed.  Consultants:  [facility]  CVTS  Cardiology  Procedures: Significant Events: 5/08 Transfer from [place] after syncopal episode, gallstones, pancreatitis, elevated troponin I, abnormal echocardiogram 5/09 TTE: EF 30-40%, akinesis of basal-midinferior myocardium - severe hypokinesis lateral myocardium - grade 1 DD 5/12 LHC: severe 3 vessel disease  5/13 CT ABD/Pelvis: infiltration edema around pancreas, c/w acute pancreatitis, no abscess, cholelithiasis with stones in CBD, bilateral pleural effusions with compressive atx. 5/15 transfused one unit PRBCs 5/16 Transferred to ICU with acute respiratory distress. Intubated. PCCM consultation. CXR revealed BLL consolidation 5/16 transfused one unit PRBCs 5/16 EGD: grade 4 esophagitis with deep ulcers, eschar, friability, blood clllots in stomach, no gastric ulcers or mucosal abnormalities, mod edema of 2nd portion of duodenum c/w pancreatitis. PPI infusion initiated. Heparin disconitnued. OGT left out 5/17 transfused one unit PRBCs for Hgb 7.4 in setting of ACS 5/18 CXR with improved basilar aeration. Extubated 5/19 LE venous duplex scans + B LE DVT 5/19 IVC filter placed   Antibiotics: Zosyn 5/12 > 5/13 Vancomycin 5/16 > 5/19 Primaxin 5/16 > 5/25  Objective: Filed Vitals:   02/08/15 1813 02/08/15 1959 02/09/15 0420 02/09/15 0500  BP: 113/49 115/47 116/57   Pulse: 76 67 76   Temp:  98.1 F (36.7 C) 98.4 F (36.9 C)   TempSrc:  Oral Oral   Resp:  19 17   Height:      Weight:    79.9 kg (176 lb 2.4 oz)  SpO2:  96% 96%      Intake/Output Summary (Last 24 hours) at 02/09/15 1134 Last data filed at 02/09/15 0848  Gross per 24 hour  Intake    360 ml  Output      0 ml  Net    360 ml   Filed Weights   02/07/15 0617 02/08/15 0400 02/09/15 0500  Weight: 81.2 kg (179 lb 0.2 oz) 79.833 kg (176 lb) 79.9 kg (176 lb 2.4 oz)    Exam: General: Alert, awake, oriented x3, in no acute distress.  HEENT: No bruits, no goiter.  Heart: Regular rate and rhythm. 2+ pitting lower ext edema. Lungs: Good air movement, clear Abdomen: Soft, nontender, nondistended, positive bowel sounds.    Data Reviewed: Basic Metabolic Panel:  Recent Labs Lab 02/04/15 0004 02/05/15 0345 02/06/15 02/07/15 0520 02/08/15 0540  NA 143 140 140 139 141  K 3.3* 4.4 4.3 4.0 3.8  CL 110 107 106 103 103  CO2 25 25 23 27 28   GLUCOSE 147* 183* 225* 153* 72  BUN 20 20 21* 18 16  CREATININE 1.35* 1.29* 1.35* 1.35* 1.36*  CALCIUM 7.1* 7.2* 7.4* 7.3* 7.5*   Liver Function Tests:  Recent Labs Lab 02/07/15 0520 02/08/15 0540  AST 40 45*  ALT 36 38  ALKPHOS 67 71  BILITOT 0.6 0.7  PROT 5.3* 5.4*  ALBUMIN 1.6* 1.7*   CBC:  Recent Labs Lab 02/04/15 0004 02/04/15 1725 02/06/15 0237 02/07/15 0520 02/08/15 0540  WBC 6.2 6.8 6.4 7.7 7.8  HGB 7.9* 9.6* 9.2* 9.0* 9.3*  HCT 24.8* 29.7* 29.3* 28.6* 30.1*  MCV  89.9 87.6 89.3 89.4 89.6  PLT 163 181 165 150 150    CBG:  Recent Labs Lab 02/08/15 0559 02/08/15 1111 02/08/15 1649 02/08/15 2120 02/09/15 0555  GLUCAP 86 143* 169* 249* 116*    Recent Results (from the past 240 hour(s))  Clostridium Difficile by PCR     Status: None   Collection Time: 02/01/15  5:16 AM  Result Value Ref Range Status   C difficile by pcr NEGATIVE NEGATIVE Final     Studies: No results found.  Scheduled Meds: . carvedilol  6.25 mg Oral BID WC  . feeding supplement (PRO-STAT SUGAR FREE 64)  30 mL Oral Q supper  . feeding supplement (RESOURCE BREEZE)  1 Container Oral BID BM  . furosemide   40 mg Intravenous Daily  . insulin aspart  0-20 Units Subcutaneous TID WC  . insulin glargine  10 Units Subcutaneous BID  . latanoprost  1 drop Both Eyes QHS  . pantoprazole (PROTONIX) IV  40 mg Intravenous Q12H  . saccharomyces boulardii  250 mg Oral BID  . sucralfate  1 g Oral 4 times per day   Continuous Infusions:     Pamella Pert  [facility] Pager [phone]. If 7PM-7AM, please contact night-coverage at [email or web address], password [facility] 02/09/2015, 11:34 AM  LOS: 27 days

## 2015-02-09 NOTE — Progress Notes (Signed)
CARDIAC REHAB PHASE I   PRE:  Rate/Rhythm: 77  BP:  Sitting: 119/45     SaO2: 95% RA  MODE:  Ambulation: 650 ft   POST:  Rate/Rhythm: 82  BP:  Sitting: 127/54     SaO2: 100% RA  1:09pm-1:32pm Patient ambulated independently using a walker. Patient increased distance by 100 feet. There were no complaints. Patient placed back in chair with feet elevated with family at side.   Teyonna Plaisted, [place], [place] 02/09/2015 1:30 PM

## 2015-02-09 NOTE — Progress Notes (Signed)
Subjective:  No complaints of chest pain or shortness of breath.  Awaiting CABG  Objective:  Vital Signs in the last 24 hours: BP 116/57 mmHg  Pulse 76  Temp(Src) 98.4 F (36.9 C) (Oral)  Resp 17  Ht 6' (1.829 m)  Wt 79.9 kg (176 lb 2.4 oz)  BMI 23.88 kg/m2  SpO2 96%  Physical Exam: Pleasant male in no acute distress Lungs:  Clear  Cardiac:  Regular rhythm, normal S1 and S2, no S3  Extremities:  No edema present  Intake/Output from previous day: 06/03 0701 - 06/04 0700 In: 240 [P.O.:240] Out: -  Weight Filed Weights   02/07/15 0617 02/08/15 0400 02/09/15 0500  Weight: 81.2 kg (179 lb 0.2 oz) 79.833 kg (176 lb) 79.9 kg (176 lb 2.4 oz)    Lab Results: Basic Metabolic Panel:  Recent Labs  51/89/84 0520 02/08/15 0540  NA 139 141  K 4.0 3.8  CL 103 103  CO2 27 28  GLUCOSE 153* 72  BUN 18 16  CREATININE 1.35* 1.36*    CBC:  Recent Labs  02/07/15 0520 02/08/15 0540  WBC 7.7 7.8  HGB 9.0* 9.3*  HCT 28.6* 30.1*  MCV 89.4 89.6  PLT 150 150   Telemetry: Sinus rhythm  Assessment/Plan:  1.  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  GI bleed with severe erosive esophagitis  3.  History of DVT 4.  Prior acute liver injury improving   Medication's:  Awaiting CABG next week.  Very complex and complicated course.  Appears to be diuresing well and appears clinically fairly stable.      Darden Palmer  MD [facility] Cardiology  02/09/2015, 11:31 AM

## 2015-02-10 LAB — BASIC METABOLIC PANEL
ANION GAP: 8 (ref 5–15)
BUN: 17 mg/dL (ref 6–20)
CALCIUM: 7.2 mg/dL — AB (ref 8.9–10.3)
CO2: 29 mmol/L (ref 22–32)
CREATININE: 1.34 mg/dL — AB (ref 0.61–1.24)
Chloride: 103 mmol/L (ref 101–111)
GFR calc Af Amer: 59 mL/min — ABNORMAL LOW (ref >60)
GFR calc non Af Amer: 51 mL/min — ABNORMAL LOW (ref >60)
Glucose, Bld: 81 mg/dL (ref 65–99)
Potassium: 3.3 mmol/L — ABNORMAL LOW (ref 3.5–5.1)
Sodium: 140 mmol/L (ref 135–145)

## 2015-02-10 LAB — GLUCOSE, CAPILLARY
Glucose-Capillary: 77 mg/dL (ref 65–99)
Glucose-Capillary: 164 mg/dL — ABNORMAL HIGH (ref 65–99)
Glucose-Capillary: 190 mg/dL — ABNORMAL HIGH (ref 65–99)
Glucose-Capillary: 158 mg/dL — ABNORMAL HIGH (ref 65–99)

## 2015-02-10 LAB — CBC
HCT: 28.2 % — ABNORMAL LOW (ref 39.0–52.0)
Hemoglobin: 8.9 g/dL — ABNORMAL LOW (ref 13.0–17.0)
MCH: 27.8 pg (ref 26.0–34.0)
MCHC: 31.6 g/dL (ref 30.0–36.0)
MCV: 88.1 fL (ref 78.0–100.0)
Platelets: 163 10*3/uL (ref 150–400)
RBC: 3.2 MIL/uL — ABNORMAL LOW (ref 4.22–5.81)
RDW: 14.6 % (ref 11.5–15.5)
WBC: 6.7 10*3/uL (ref 4.0–10.5)

## 2015-02-10 MED ORDER — POTASSIUM CHLORIDE CRYS ER 20 MEQ PO TBCR
40.0000 meq | EXTENDED_RELEASE_TABLET | Freq: Once | ORAL | Status: AC
  Administered 2015-02-10: 40 meq via ORAL
  Filled 2015-02-10: qty 2

## 2015-02-10 MED ORDER — POTASSIUM CHLORIDE 10 MEQ/100ML IV SOLN
10.0000 meq | INTRAVENOUS | Status: AC
  Administered 2015-02-10: 10 meq via INTRAVENOUS
  Filled 2015-02-10 (×2): qty 100

## 2015-02-10 MED ORDER — POTASSIUM CHLORIDE 10 MEQ/100ML IV SOLN
10.0000 meq | Freq: Once | INTRAVENOUS | Status: AC
  Administered 2015-02-10: 10 meq via INTRAVENOUS

## 2015-02-10 NOTE — Progress Notes (Signed)
Subjective:  No complaints of chest pain or shortness of breath.  Awaiting CABG  Objective:  Vital Signs in the last 24 hours: BP 113/50 mmHg  Pulse 70  Temp(Src) 98.1 F (36.7 C) (Oral)  Resp 16  Ht 6' (1.829 m)  Wt 79.107 kg (174 lb 6.4 oz)  BMI 23.65 kg/m2  SpO2 93%  Physical Exam: Pleasant male in no acute distress Lungs:  Clear  Cardiac:  Regular rhythm, normal S1 and S2, no S3  Extremities:  No edema present  Intake/Output from previous day: 06/04 0701 - 06/05 0700 In: 360 [P.O.:360] Out: 2 [Urine:1; Stool:1] Weight Filed Weights   02/08/15 0400 02/09/15 0500 02/10/15 0526  Weight: 79.833 kg (176 lb) 79.9 kg (176 lb 2.4 oz) 79.107 kg (174 lb 6.4 oz)    Lab Results: Basic Metabolic Panel:  Recent Labs  20/94/70 0540 02/10/15 0553  NA 141 140  K 3.8 3.3*  CL 103 103  CO2 28 29  GLUCOSE 72 81  BUN 16 17  CREATININE 1.36* 1.34*    CBC:  Recent Labs  02/08/15 0540 02/10/15 0553  WBC 7.8 6.7  HGB 9.3* 8.9*  HCT 30.1* 28.2*  MCV 89.6 88.1  PLT 150 163   Telemetry: Sinus rhythm  Assessment/Plan:  1.  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  GI bleed with severe erosive esophagitis  3.  History of DVT 4.  Prior acute liver injury improving   Medication's:  Awaiting CABG next week.  Appears stable today.  The family had a number of questions about blood clots and I talked to them about the filter, previous right lower extremity DVT, and recent results of vein mapping.  Darden Palmer  MD [facility] Cardiology  02/10/2015, 11:19 AM

## 2015-02-10 NOTE — Progress Notes (Signed)
[facility] PROGRESS NOTE HPI/Subjective: - doing well, no changes  - no complaints, eating well  Assessment/Plan:  NSTEMI with severe triple-vessel disease/chronic systolic heart failure/ischemic cardiomyopathy: - Cardiac cath revealed severe triple-vessel disease, Cardiology and TCTS suggest the patient not leave the hospital though patient will need to fully recover from his severe acute pancreatitis prior to surgery. - Continue Coreg. Further titration per cardiology. - His Cr cont to improved ? If he might benfeit from ACE-I. - keep Hb > 8, stable today  - Restrict his fluid intake monitor strict I's and O's. - plan for CABG next week, keep inpatient until then per TCV; nutritional status improving, his pre-albumin is 10 - no chest pain today   Hemorrhagic shock/acute blood loss anemia due to erosive esophagitis: - EGD done showed esophagitis, hemorraghic shock due to esophagitis plus anti-coagulation, now resolved status post 5 units of packed red blood cells. Keep hemoglobin closer to 9.  - repeated EGD on 5.31.2016 showed healing ulcer. GI recommended to add carafate. - Continue PPI twice a day. - ferritin high, B12 high - biopsy without evidence of malignancy, chronic reflux - repeat Hb today stable 8.9  Acute gallstone pancreatitis: - MRCP suggested the possibility of a 4 mm stone within the common bile duct. - GI was consulted which recommended laparoscopic cholecystectomy with intraoperative cholangiogram. - Surgery rec cholecystectomy as an outpatient. - He follow-up with surgery as an outpatient after surgery triple-vessel disease problem resolved. - tolerating diet well, no abdominal pain  Acute bilateral lower extremity DVT: - IVC filter was placed. - will not be a candidate for anticoagulation due to GI bleeding for the next 2 weeks at least per GI  Ileus: - Resolved with conservative management. - with intermittent diarrhea, C diff negative x  2  Acute kidney injury: - Renal ultrasound showed no acute findings.  - continue Lasix  - Cr stable today   Hypocalcemia: - Corrected calcium is actually 9. No further interventions.  Persisting hypokalemia: - stable today, will supplement today  Hypophosphatemia: - Has normalized with supplementation.  Syncopal episode most likely vasovagal: - No further episodes.  Bradycardia: - Felt to be vaguely mediated no resolved.  Thrombocytopenia  - HIT panel negative - suspect due to acute inflam illness - no evidence of acute blood loss - platelet count has normalized  Hypotension > hypertension - Hypotension resolved with volume expansion - blood pressure controlled  DM - Blood glucose well controlled continue Lantus plus sliding scale insulin.  Code Status: FULL Family Communication: discussed with his wife and daughter bedside Disposition Plan: Insurance will not cover CIR - cardiac telemetry bed while awaiting CABG  Interim History: 74 year old male who presented to [facility] hospital for syncopal episode and bradycardia which was later attribute to vasovagal response. Would have persisting epigastric pain with nausea. In the ED at [facility] a CT scan of the abdomen and pelvis show very stone lipase was greater than 300 LFTs were elevated lactic acid of 2.2 with a creatinine 1.7 she was transferred to  [facility] hospital. 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 CT scan of the abdomen and pelvis showed edema around the pancreas compatible with pancreatitis no abscess choledocholithiasis with stone in common bile duct and pleural effusion. Started having black stool and found to be in  hemorrhagic shock and had to be transfused several unit of  packed red blood cells. Transferred to the ICU for acute respiratory distress intubated and PCCM took over with a chest x-ray which revealed bilateral consolidation. An EGD was performed that showed a grade 4 esophagitis with deep ulcers and friable mucosa. Anticoagulants were held along with aspirin. He is only on Lovenox for DVT prophylaxis. Chest x-ray on 518 showed improving aeration and  was extubated. Lower extremity Doppler done on 01/24/2015 showed a bilateral lower extremity DVTs, IVC filter was placed.  Consultants:  [facility]  CVTS  Cardiology  Procedures: Significant Events: 5/08 Transfer from [place] after syncopal episode, gallstones, pancreatitis, elevated troponin I, abnormal echocardiogram 5/09 TTE: EF 30-40%, akinesis of basal-midinferior myocardium - severe hypokinesis lateral myocardium - grade 1 DD 5/12 LHC: severe 3 vessel disease  5/13 CT ABD/Pelvis: infiltration edema around pancreas, c/w acute pancreatitis, no abscess, cholelithiasis with stones in CBD, bilateral pleural effusions with compressive atx. 5/15 transfused one unit PRBCs 5/16 Transferred to ICU with acute respiratory distress. Intubated. PCCM consultation. CXR revealed BLL consolidation 5/16 transfused one unit PRBCs 5/16 EGD: grade 4 esophagitis with deep ulcers, eschar, friability, blood clllots in stomach, no gastric ulcers or mucosal abnormalities, mod edema of 2nd portion of duodenum c/w pancreatitis. PPI infusion initiated. Heparin disconitnued. OGT left out 5/17 transfused one unit PRBCs for Hgb 7.4 in setting of ACS 5/18 CXR with improved basilar aeration. Extubated 5/19 LE venous duplex scans + B LE DVT 5/19 IVC filter placed  5/31 repeat EGD by Dr. Juanda Chance ENDOSCOPIC IMPRESSION: 1. Circumferential diffuse abnormal mucosa was found in the entire esophagus c/m Grade 3 esophagitis. The mucosa was bleeding, friable, edematous, nodular and had erosions; multiple biopsies were  performed 2. Bile gastritis (inflammation) was found 3. 3 cm hiatal hernia 4. Duodenal inflammation was found in the bulb and second portion of the duodenum , bopsies obtained  RECOMMENDATIONS: 1. Await pathology results 2. Anti-reflux regimen to be follow 3. do not recommend restarting long-term anticoagulation. Would prefer to wait 2 more weeks 4. Add Carafate slurry 10 cc by mouth 4 times a day 5. Obtain barium esophagram to assess motility as well as degree of reflux 6. Consider adding low dose Reglan   Antibiotics: Zosyn 5/12 > 5/13 Vancomycin 5/16 > 5/19 Primaxin 5/16 > 5/25  Objective: Filed Vitals:   02/09/15 0500 02/09/15 1433 02/09/15 2106 02/10/15 0526  BP:  84/54 107/44 113/50  Pulse:  73 75 70  Temp:  98.2 F (36.8 C) 98.3 F (36.8 C) 98.1 F (36.7 C)  TempSrc:  Oral Oral Oral  Resp:  Height:      Weight: 79.9 kg (176 lb 2.4 oz)   79.107 kg (174 lb 6.4 oz)  SpO2:  97% 97% 93%    Intake/Output Summary (Last 24 hours) at 02/10/15 0751 Last data filed at 02/09/15 1928  Gross per 24 hour  Intake    360 ml  Output      2 ml  Net    358 ml   Filed Weights   02/08/15 0400 02/09/15 0500 02/10/15 0526  Weight: 79.833 kg (176 lb) 79.9 kg (176 lb 2.4 oz) 79.107 kg (174 lb 6.4 oz)   Exam: General: Alert, awake, oriented x3, in no acute distress.  HEENT: No bruits, no goiter.  Heart: Regular rate and rhythm. 2+ pitting lower ext edema. Lungs: Good air movement, clear Abdomen: Soft, nontender, nondistended, positive bowel sounds.   Data  Reviewed: Basic Metabolic Panel:  Recent Labs Lab 02/05/15 0345 02/06/15 02/07/15 0520 02/08/15 0540 02/10/15 0553  NA 140 140 139 141 140  K 4.4 4.3 4.0 3.8 3.3*  CL 107 106 103 103 103  CO2 GLUCOSE 183* 225* 153* 72 81  BUN 20 21* CREATININE 1.29* 1.35* 1.35* 1.36* 1.34*  CALCIUM 7.2* 7.4* 7.3* 7.5* 7.2*   Liver Function Tests:  Recent Labs Lab 02/07/15 0520  02/08/15 0540  AST 40 45*  ALT 36 38  ALKPHOS 67 71  BILITOT 0.6 0.7  PROT 5.3* 5.4*  ALBUMIN 1.6* 1.7*   CBC:  Recent Labs Lab 02/04/15 1725 02/06/15 0237 02/07/15 0520 02/08/15 0540 02/10/15 0553  WBC 6.8 6.4 7.7 7.8 6.7  HGB 9.6* 9.2* 9.0* 9.3* 8.9*  HCT 29.7* 29.3* 28.6* 30.1* 28.2*  MCV 87.6 89.3 89.4 89.6 88.1  PLT 181 165 150 150 163    CBG:  Recent Labs Lab 02/09/15 0555 02/09/15 1137 02/09/15 1624 02/09/15 2222 02/10/15 0637  GLUCAP 116* 204* 183* 140* 77    Recent Results (from the past 240 hour(s))  Clostridium Difficile by PCR     Status: None   Collection Time: 02/01/15  5:16 AM  Result Value Ref Range Status   C difficile by pcr NEGATIVE NEGATIVE Final     Studies: No results found.  Scheduled Meds: . carvedilol  6.25 mg Oral BID WC  . feeding supplement (PRO-STAT SUGAR FREE 64)  30 mL Oral Q supper  . feeding supplement (RESOURCE BREEZE)  1 Container Oral BID BM  . furosemide  40 mg Intravenous Daily  . insulin aspart  0-20 Units Subcutaneous TID WC  . insulin glargine  10 Units Subcutaneous BID  . latanoprost  1 drop Both Eyes QHS  . pantoprazole (PROTONIX) IV  40 mg Intravenous Q12H  . saccharomyces boulardii  250 mg Oral BID  . sucralfate  1 g Oral 4 times per day   Continuous Infusions:     Pamella Pert  [facility] Pager [phone]. If 7PM-7AM, please contact night-coverage at [email or web address], password [facility] 02/10/2015, 7:51 AM  LOS: 28 days

## 2015-02-11 ENCOUNTER — Inpatient Hospital Stay (HOSPITAL_COMMUNITY): Payer: Medicare Other

## 2015-02-11 ENCOUNTER — Ambulatory Visit (HOSPITAL_COMMUNITY): Payer: Medicare Other

## 2015-02-11 DIAGNOSIS — Z86718 Personal history of other venous thrombosis and embolism: Secondary | ICD-10-CM

## 2015-02-11 LAB — GLUCOSE, CAPILLARY
GLUCOSE-CAPILLARY: 115 mg/dL — AB (ref 65–99)
GLUCOSE-CAPILLARY: 181 mg/dL — AB (ref 65–99)
Glucose-Capillary: 158 mg/dL — ABNORMAL HIGH (ref 65–99)
Glucose-Capillary: 159 mg/dL — ABNORMAL HIGH (ref 65–99)

## 2015-02-11 LAB — URINALYSIS, ROUTINE W REFLEX MICROSCOPIC
Bilirubin Urine: NEGATIVE
Glucose, UA: NEGATIVE mg/dL
Hgb urine dipstick: NEGATIVE
Ketones, ur: NEGATIVE mg/dL
Leukocytes, UA: NEGATIVE
Nitrite: NEGATIVE
Protein, ur: NEGATIVE mg/dL
Specific Gravity, Urine: 1.014 (ref 1.005–1.030)
Urobilinogen, UA: 1 mg/dL (ref 0.0–1.0)
pH: 5 (ref 5.0–8.0)

## 2015-02-11 LAB — SURGICAL PCR SCREEN
MRSA, PCR: NEGATIVE
Staphylococcus aureus: NEGATIVE

## 2015-02-11 LAB — APTT: aPTT: 29 seconds (ref 24–37)

## 2015-02-11 NOTE — Progress Notes (Signed)
CARDIAC REHAB PHASE I   PRE:  Rate/Rhythm: 84 SR c/ PACs  BP:  Sitting: 113/46        SaO2: 99 RA  MODE:  Ambulation: 550 ft   POST:  Rate/Rhythm: 86 SR  BP:  Sitting: 119/51         SaO2: 100 RA  Pt ambulated 550 ft on RA, rolling walker, assist x1, steady gait, tolerated well. Pt denies cp, dizziness, DOE. Pt states he "hopes everything goes well Thursday."  No other complaints. Pt to recliner after walk, feet elevated, call bell within reach, wife and daughter at bedside.   [id number]  Joylene Grapes, RN, BSN 02/11/2015 9:55 AM

## 2015-02-11 NOTE — Progress Notes (Signed)
[facility] PROGRESS NOTE HPI/Subjective: - doing well, no changes  - denies shortness of breath, chronic cough unchanged, no sputum  Assessment/Plan:  NSTEMI with severe triple-vessel disease/chronic systolic heart failure/ischemic cardiomyopathy: - Cardiac cath revealed severe triple-vessel disease, Cardiology and TCTS suggest the patient not leave the hospital though patient will need to fully recover from his severe acute pancreatitis prior to surgery. - Continue Coreg. Further titration per cardiology. - His Cr cont to improved ? If he might benfeit from ACE-I. - keep Hb > 8, stable today  - Restrict his fluid intake monitor strict I's and O's. - plan for CABG next week, keep inpatient until then per TCV; nutritional status improving, his pre-albumin is now improved to 10 - no chest pain today   Hemorrhagic shock/acute blood loss anemia due to erosive esophagitis: - EGD done showed esophagitis, hemorraghic shock due to esophagitis plus anti-coagulation, now resolved status post 5 units of packed red blood cells. Keep hemoglobin closer to 9.  - repeated EGD on 5.31.2016 showed healing ulcer. GI recommended to add carafate. - Continue PPI twice a day. - ferritin high, B12 high - biopsy during EGD without evidence of malignancy, chronic reflux - Hb stable  Acute gallstone pancreatitis: - MRCP suggested the possibility of a 4 mm stone within the common bile duct. - GI was consulted which recommended laparoscopic cholecystectomy with intraoperative cholangiogram. - Surgery rec cholecystectomy as an outpatient. - He follow-up with surgery as an outpatient after surgery triple-vessel disease problem resolved. - tolerating diet well, no abdominal pain  Acute bilateral lower extremity DVT: - IVC filter was placed. - will not be a candidate for anticoagulation due to GI bleeding for the next 1 week at least per GI  Ileus: - Resolved with conservative management. - with  intermittent diarrhea, C diff negative x 2  Acute kidney injury: - Renal ultrasound showed no acute findings.  - continue Lasix  - Cr stable today   Hypocalcemia: - Corrected calcium is actually 9. No further interventions.  Persisting hypokalemia: - recheck K in am  Hypophosphatemia: - Has normalized with supplementation.  Syncopal episode most likely vasovagal: - No further episodes.  Bradycardia: - Felt to be vaguely mediated now resolved.  Thrombocytopenia  - HIT panel negative - suspect due to acute inflam illness - no evidence of acute blood loss - platelet count has normalized  Hypotension > hypertension - Hypotension resolved with volume expansion - blood pressure controlled  DM - Blood glucose well controlled continue Lantus plus sliding scale insulin.  Code Status: FULL Family Communication: discussed with his wife and daughter bedside Disposition Plan: Insurance will not cover CIR - cardiac telemetry bed while awaiting CABG  Interim History: 74 year old male who presented to [facility] hospital for syncopal episode and bradycardia which was later attribute to vasovagal response. Would have persisting epigastric pain with nausea. In the ED at [facility] a CT scan of the abdomen and pelvis show very stone lipase was greater than 300 LFTs were elevated lactic acid of 2.2 with a creatinine 1.7 she was transferred to  [facility] hospital. 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 CT scan of the abdomen and pelvis showed edema around the pancreas compatible with pancreatitis no abscess choledocholithiasis with stone in common bile duct and pleural effusion. Started having black  stool and found to be in hemorrhagic shock and had to be  transfused several unit of packed red blood cells. Transferred to the ICU for acute respiratory distress intubated and PCCM took over with a chest x-ray which revealed bilateral consolidation. An EGD was performed that showed a grade 4 esophagitis with deep ulcers and friable mucosa. Anticoagulants were held along with aspirin. He is only on Lovenox for DVT prophylaxis. Chest x-ray on 518 showed improving aeration and  was extubated. Lower extremity Doppler done on 01/24/2015 showed a bilateral lower extremity DVTs, IVC filter was placed.  Consultants:  [facility]  CVTS  Cardiology  Procedures: Significant Events: 5/08 Transfer from [place] after syncopal episode, gallstones, pancreatitis, elevated troponin I, abnormal echocardiogram 5/09 TTE: EF 30-40%, akinesis of basal-midinferior myocardium - severe hypokinesis lateral myocardium - grade 1 DD 5/12 LHC: severe 3 vessel disease  5/13 CT ABD/Pelvis: infiltration edema around pancreas, c/w acute pancreatitis, no abscess, cholelithiasis with stones in CBD, bilateral pleural effusions with compressive atx. 5/15 transfused one unit PRBCs 5/16 Transferred to ICU with acute respiratory distress. Intubated. PCCM consultation. CXR revealed BLL consolidation 5/16 transfused one unit PRBCs 5/16 EGD: grade 4 esophagitis with deep ulcers, eschar, friability, blood clllots in stomach, no gastric ulcers or mucosal abnormalities, mod edema of 2nd portion of duodenum c/w pancreatitis. PPI infusion initiated. Heparin disconitnued. OGT left out 5/17 transfused one unit PRBCs for Hgb 7.4 in setting of ACS 5/18 CXR with improved basilar aeration. Extubated 5/19 LE venous duplex scans + B LE DVT 5/19 IVC filter placed  5/31 repeat EGD by Dr. Juanda Chance ENDOSCOPIC IMPRESSION: 1. Circumferential diffuse abnormal mucosa was found in the entire esophagus c/m Grade 3 esophagitis. The mucosa was bleeding, friable, edematous, nodular and had erosions;  multiple biopsies were performed 2. Bile gastritis (inflammation) was found 3. 3 cm hiatal hernia 4. Duodenal inflammation was found in the bulb and second portion of the duodenum , bopsies obtained  RECOMMENDATIONS: 1. Await pathology results 2. Anti-reflux regimen to be follow 3. do not recommend restarting long-term anticoagulation. Would prefer to wait 2 more weeks 4. Add Carafate slurry 10 cc by mouth 4 times a day 5. Obtain barium esophagram to assess motility as well as degree of reflux 6. Consider adding low dose Reglan   Antibiotics: Zosyn 5/12 > 5/13 Vancomycin 5/16 > 5/19 Primaxin 5/16 > 5/25  Objective: Filed Vitals:   02/10/15 1445 02/10/15 2041 02/11/15 0636 02/11/15 0640  BP: 123/49 113/47 125/54 125/54  Pulse: 77 77 81   Temp: 98 F (36.7 C) 98.4 F (36.9 C) 98.6 F (37 C) 98.6 F (37 C)  TempSrc: Oral Oral Oral Oral  Resp: Height:      Weight:    78.8 kg (173 lb 11.6 oz)  SpO2: 99% 99% 97% 97%    Intake/Output Summary (Last 24 hours) at 02/11/15 1137 Last data filed at 02/10/15 1859  Gross per 24 hour  Intake    539 ml  Output      0 ml  Net    539 ml   Filed Weights   02/09/15 0500 02/10/15 0526 02/11/15 0640  Weight: 79.9 kg (176 lb 2.4 oz) 79.107 kg (174 lb 6.4 oz) 78.8 kg (173 lb 11.6 oz)   Exam: General: Alert, awake, oriented x3, in no acute distress.  HEENT: No bruits, no goiter.  Heart: Regular rate and rhythm. 2+ pitting lower ext edema. Lungs: Good air movement, clear Abdomen: Soft, nontender, nondistended,  positive bowel sounds.   Data Reviewed: Basic Metabolic Panel:  Recent Labs Lab 02/05/15 0345 02/06/15 02/07/15 0520 02/08/15 0540 02/10/15 0553  NA 140 140 139 141 140  K 4.4 4.3 4.0 3.8 3.3*  CL 107 106 103 103 103  CO2 GLUCOSE 183* 225* 153* 72 81  BUN 20 21* CREATININE 1.29* 1.35* 1.35* 1.36* 1.34*  CALCIUM 7.2* 7.4* 7.3* 7.5* 7.2*   Liver Function  Tests:  Recent Labs Lab 02/07/15 0520 02/08/15 0540  AST 40 45*  ALT 36 38  ALKPHOS 67 71  BILITOT 0.6 0.7  PROT 5.3* 5.4*  ALBUMIN 1.6* 1.7*   CBC:  Recent Labs Lab 02/04/15 1725 02/06/15 0237 02/07/15 0520 02/08/15 0540 02/10/15 0553  WBC 6.8 6.4 7.7 7.8 6.7  HGB 9.6* 9.2* 9.0* 9.3* 8.9*  HCT 29.7* 29.3* 28.6* 30.1* 28.2*  MCV 87.6 89.3 89.4 89.6 88.1  PLT 181 165 150 150 163    CBG:  Recent Labs Lab 02/10/15 0637 02/10/15 1130 02/10/15 1613 02/10/15 2038 02/11/15 0634  GLUCAP 77 164* 190* 158* 115*    No results found for this or any previous visit (from the past 240 hour(s)).   Studies: Dg Chest 2 View  02/11/2015   CLINICAL DATA:  74 year old male with shortness of breath and abnormal pulmonary auscultation. Initial encounter.  EXAM: CHEST  2 VIEW  COMPARISON:  Chest radiographs 02/03/2015 and earlier. Barium swallow 02/06/2015.  FINDINGS: Aspirated barium in left greater than right lung lower and middle lobe airways. Small bilateral pleural effusions are stable to slightly smaller. No superimposed pneumothorax, pulmonary edema, or other acute pulmonary opacity. Normal cardiac size and mediastinal contours. No acute osseous abnormality identified.  IMPRESSION: 1. Stable the slightly decreased small bilateral pleural effusions. 2. Lower lobe/middle lobe aspirated barium, see also Esophagram report 02/06/2015.   Electronically Signed   By: Odessa Fleming M.D.   On: 02/11/2015 07:54    Scheduled Meds: . carvedilol  6.25 mg Oral BID WC  . feeding supplement (PRO-STAT SUGAR FREE 64)  30 mL Oral Q supper  . feeding supplement (RESOURCE BREEZE)  1 Container Oral BID BM  . furosemide  40 mg Intravenous Daily  . insulin aspart  0-20 Units Subcutaneous TID WC  . insulin glargine  10 Units Subcutaneous BID  . latanoprost  1 drop Both Eyes QHS  . pantoprazole (PROTONIX) IV  40 mg Intravenous Q12H  . saccharomyces boulardii  250 mg Oral BID  . sucralfate  1 g Oral 4 times  per day   Continuous Infusions:     Pamella Pert  [facility] Pager [phone]. If 7PM-7AM, please contact night-coverage at [email or web address], password [facility] 02/11/2015, 11:37 AM  LOS: 29 days

## 2015-02-11 NOTE — Progress Notes (Signed)
Occupational Therapy Treatment Patient Details Name: [name] [name] MRN: [id number] DOB: August 28, 1941 Today's Date: 02/11/2015    History of present illness Admitted with epigastric pain significant; also with NSTEMI; Cardiac cath revealed total RCA, total prox cfx, severe prox LAD, 50% dz of dominant diagonal - Cards following - will need eventual CABG - TCTS now following - patient will need to fully recover from his severe acute pancreatitis prior to surgery. Increased WOB and GIB with pt transferred to ICU and intubated 5/16 to 5/18. 5/19 bil LE DVTs; s/p IVC   OT comments  Pt progressing and plans to have surgery on Thursday. Discharge plan to be established after pt's surgery. Plan to update goals after pt has surgery this Thursday.  Follow Up Recommendations  Other (comment) (TBD after surgery)    Equipment Recommendations   (TBD)    Recommendations for Other Services      Precautions / Restrictions Precautions Precautions: Fall Precaution Comments: Fall risk greatly reduced with use of RW Restrictions Weight Bearing Restrictions: No       Mobility Bed Mobility               General bed mobility comments: not assessed  Transfers Overall transfer level: Needs assistance   Transfers: Sit to/from Stand Sit to Stand: Supervision;Mod assist         General transfer comment: No physical assist needed when pt using hands to assist; practiced with pt's hands on knees for transfers to try to maintain sternal precautions as he is having surgery this week. Mod assist to rise without using arm rests.    Balance  Supervision for ambulation with RW. Stood at sink with no LOB.                                  ADL Overall ADL's : Needs assistance/impaired     Grooming: Oral care;Applying deodorant;Brushing hair;Standing;Set up;Supervision/safety (shaving)   Upper Body Bathing: Set up;Supervision/ safety;Standing (washed armpits)   Lower Body Bathing:  Supervison/ safety;Set up (standing ) Lower Body Bathing Details (indicate cue type and reason): washed bottom Upper Body Dressing : Standing;Set up;Supervision/safety Upper Body Dressing Details (indicate cue type and reason): assist in tying gown-however feel he could manage shirt without assist     Toilet Transfer: Supervision/safety;Ambulation;RW (chair; Mod Assist for sit to stand transfer without using hands)           Functional mobility during ADLs: Supervision/safety;Rolling walker General ADL Comments: pt performed ADLs at sink while standing.  Elevated legs at end of session on 2 pillows.      Vision                     Perception     Praxis      Cognition  Awake/Alert Behavior During Therapy: WFL for tasks assessed/performed Overall Cognitive Status: Within Functional Limits for tasks assessed                       Extremity/Trunk Assessment               Exercises     Shoulder Instructions       General Comments      Pertinent Vitals/ Pain       Pain Assessment: No/denies pain; HR stable.  Home Living  Prior Functioning/Environment              Frequency Min 2X/week     Progress Toward Goals  OT Goals(current goals can now be found in the care plan section)  Progress towards OT goals: Progressing toward goals (goals need to be updated after surgery this Thursday) Acute Rehab OT Goals Patient Stated Goal: wants to recover OT Goal Formulation: With patient Time For Goal Achievement: 02/07/15 Potential to Achieve Goals: Good    Plan Other (comment);Discharge plan needs to be updated (TBD)    Co-evaluation                 End of Session Equipment Utilized During Treatment: Gait belt;Rolling walker   Activity Tolerance Patient tolerated treatment well   Patient Left in chair;with call bell/phone within reach;with family/visitor present   Nurse  Communication Mobility status;Other (comment) (nursing can walk him)        Time: 1610-9604 OT Time Calculation (min): 20 min  Charges: OT General Charges $OT Visit: 1 Procedure OT Treatments $Self Care/Home Management : 8-22 mins  Earlie Raveling OTR/L [phone] 02/11/2015, 10:48 AM

## 2015-02-11 NOTE — Progress Notes (Signed)
*  PRELIMINARY RESULTS* Vascular Ultrasound Lower extremity venous duplex has been completed.  Preliminary findings: Acute vs subacute DVT noted in the Right posterior tibial veins, Right peroneal veins, Left gastroc veins, Left soleal veins, and Left peroneal veins.  Farrel Demark, RDMS, RVT  02/11/2015, 8:26 AM

## 2015-02-11 NOTE — Progress Notes (Signed)
6 Days Post-Op Procedure(s) (LRB): ESOPHAGOGASTRODUODENOSCOPY (EGD) (N/A) Subjective: 3 vessel CAD, recent nonSTEMI and LV dysfx Plan CABG thurs 6-9 Scan LE DVT preop Nutrition status is improvingstable Objective: Vital signs in last 24 hours: Temp:  [98 F (36.7 C)-98.6 F (37 C)] 98.6 F (37 C) (06/06 0640) Pulse Rate:  [77-81] 81 (06/06 0636) Cardiac Rhythm:  [-] Normal sinus rhythm (06/05 2115) Resp:  [18] 18 (06/06 0640) BP: (113-125)/(47-54) 125/54 mmHg (06/06 0640) SpO2:  [97 %-99 %] 97 % (06/06 0640) Weight:  [173 lb 11.6 oz (78.8 kg)] 173 lb 11.6 oz (78.8 kg) (06/06 0640)  Hemodynamic parameters for last 24 hours:    Intake/Output from previous day: 06/05 0701 - 06/06 0700 In: 775 [P.O.:775] Out: -  Intake/Output this shift:      Lab Results:  Recent Labs  02/10/15 0553  WBC 6.7  HGB 8.9*  HCT 28.2*  PLT 163   BMET:  Recent Labs  02/10/15 0553  NA 140  K 3.3*  CL 103  CO2 29  GLUCOSE 81  BUN 17  CREATININE 1.34*  CALCIUM 7.2*    PT/INR: No results for input(s): LABPROT, INR in the last 72 hours. ABG    Component Value Date/Time   PHART 7.440 01/22/2015 0325   HCO3 17.6* 01/22/2015 0325   TCO2 18.4 01/22/2015 0325   ACIDBASEDEF 5.8* 01/22/2015 0325   O2SAT 58.4 01/23/2015 1105   CBG (last 3)   Recent Labs  02/10/15 1613 02/10/15 2038 02/11/15 0634  GLUCAP 190* 158* 115*    Assessment/Plan: S/P Procedure(s) (LRB): ESOPHAGOGASTRODUODENOSCOPY (EGD) (N/A) CABG 6-9   LOS: 29 days    [name] [name] 02/11/2015

## 2015-02-11 NOTE — Progress Notes (Signed)
Subjective:  No complaints of chest pain or shortness of breath.   Objective:  Vital Signs in the last 24 hours: BP 125/54 mmHg  Pulse 81  Temp(Src) 98.6 F (37 C) (Oral)  Resp 18  Ht 6' (1.829 m)  Wt 173 lb 11.6 oz (78.8 kg)  BMI 23.56 kg/m2  SpO2 97%  Physical Exam: Pleasant male; WD/WNin no acute distress HEENT: Normal Neck: supple Lungs:  Clear  CV RRR Abd: soft not tender; mildly distended Extremities:  2+ edema   Intake/Output from previous day: 06/05 0701 - 06/06 0700 In: 775 [P.O.:775] Out: -  Weight Filed Weights   02/09/15 0500 02/10/15 0526 02/11/15 0640  Weight: 176 lb 2.4 oz (79.9 kg) 174 lb 6.4 oz (79.107 kg) 173 lb 11.6 oz (78.8 kg)    Lab Results: Basic Metabolic Panel:  Recent Labs  42/59/56 0553  NA 140  K 3.3*  CL 103  CO2 29  GLUCOSE 81  BUN 17  CREATININE 1.34*    CBC:  Recent Labs  02/10/15 0553  WBC 6.7  HGB 8.9*  HCT 28.2*  MCV 88.1  PLT 163    Assessment/Plan:  1.  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  GI bleed with severe erosive esophagitis  3.  History of DVT 4.  Recent pancreatitis 5.  Bilateral DVT s/p IVC filter For CABG Thursday. Plan to continue coreg; add ACEI post op once clear renal function stable; will need FU echo in 3 months to see if LV function improved; if not would need to consider ICD.  Continue lasix for volume excess. Will need to consider anticoagulation once ok from a GI standpoint.  Cardiology  02/11/2015, 9:14 AM

## 2015-02-11 NOTE — Care Management Note (Addendum)
Case Management Note  Patient Details  Name: [name] [name] MRN: [id number] Date of Birth: [birth year]  Subjective/Objective:  Pt intially admitted for gallstone pancreatitis.       Action/Plan:  Pt has CABG scheduled for 02/14/15.  CM will continue to monitor for disposition needs  Expected Discharge Date:                  Expected Discharge Plan:  Will defer until post CABG  In-House Referral:  CM  Discharge planning Services     Post Acute Care Choice:    Choice offered to:     DME Arranged:    DME Agency:     HH Arranged:    HH Agency:     Status of Service:     Medicare Important Message Given:  Yes Date Medicare IM Given:  02/07/15 Medicare IM give by:  Raynald Blend Date Additional Medicare IM Given:  02/08/15 Additional Medicare Important Message give by:  Raynald Blend  If discussed at Long Length of Stay Meetings, dates discussed:  02/07/15  Additional Comments:  Cherylann Parr, RN 02/11/2015, 10:01 AM

## 2015-02-12 ENCOUNTER — Inpatient Hospital Stay (HOSPITAL_COMMUNITY): Payer: Medicare Other

## 2015-02-12 LAB — GLUCOSE, CAPILLARY
GLUCOSE-CAPILLARY: 161 mg/dL — AB (ref 65–99)
GLUCOSE-CAPILLARY: 126 mg/dL — AB (ref 65–99)
Glucose-Capillary: 143 mg/dL — ABNORMAL HIGH (ref 65–99)
Glucose-Capillary: 143 mg/dL — ABNORMAL HIGH (ref 65–99)

## 2015-02-12 LAB — COMPREHENSIVE METABOLIC PANEL
ALT: 28 U/L (ref 17–63)
AST: 28 U/L (ref 15–41)
Albumin: 1.8 g/dL — ABNORMAL LOW (ref 3.5–5.0)
Alkaline Phosphatase: 67 U/L (ref 38–126)
Anion gap: 6 (ref 5–15)
BUN: 18 mg/dL (ref 6–20)
CO2: 29 mmol/L (ref 22–32)
Calcium: 7.3 mg/dL — ABNORMAL LOW (ref 8.9–10.3)
Chloride: 102 mmol/L (ref 101–111)
Creatinine, Ser: 1.36 mg/dL — ABNORMAL HIGH (ref 0.61–1.24)
GFR calc Af Amer: 58 mL/min — ABNORMAL LOW (ref >60)
GFR calc non Af Amer: 50 mL/min — ABNORMAL LOW (ref >60)
Glucose, Bld: 132 mg/dL — ABNORMAL HIGH (ref 65–99)
Potassium: 3.6 mmol/L (ref 3.5–5.1)
Sodium: 137 mmol/L (ref 135–145)
Total Bilirubin: 0.8 mg/dL (ref 0.3–1.2)
Total Protein: 5.7 g/dL — ABNORMAL LOW (ref 6.5–8.1)

## 2015-02-12 LAB — CBC
HCT: 26.8 % — ABNORMAL LOW (ref 39.0–52.0)
Hemoglobin: 8.5 g/dL — ABNORMAL LOW (ref 13.0–17.0)
MCH: 28.2 pg (ref 26.0–34.0)
MCHC: 31.7 g/dL (ref 30.0–36.0)
MCV: 89 fL (ref 78.0–100.0)
Platelets: 143 10*3/uL — ABNORMAL LOW (ref 150–400)
RBC: 3.01 MIL/uL — ABNORMAL LOW (ref 4.22–5.81)
RDW: 14.6 % (ref 11.5–15.5)
WBC: 6.9 10*3/uL (ref 4.0–10.5)

## 2015-02-12 LAB — PULMONARY FUNCTION TEST
DL/VA % pred: 113 %
DL/VA: 5.27 ml/min/mmHg/L
DLCO cor % pred: 63 %
DLCO cor: 21.53 ml/min/mmHg
DLCO unc % pred: 49 %
DLCO unc: 16.62 ml/min/mmHg
FEF 25-75 Post: 1.34 L/sec
FEF 25-75 Pre: 1.98 L/sec
FEF2575-%Change-Post: -32 %
FEF2575-%Pred-Post: 55 %
FEF2575-%Pred-Pre: 81 %
FEV1-%Change-Post: -6 %
FEV1-%Pred-Post: 63 %
FEV1-%Pred-Pre: 67 %
FEV1-Post: 2.08 L
FEV1-Pre: 2.22 L
FEV1FVC-%Change-Post: -5 %
FEV1FVC-%Pred-Pre: 105 %
FEV6-%Change-Post: -2 %
FEV6-%Pred-Post: 66 %
FEV6-%Pred-Pre: 68 %
FEV6-Post: 2.82 L
FEV6-Pre: 2.9 L
FEV6FVC-%Change-Post: -1 %
FEV6FVC-%Pred-Post: 104 %
FEV6FVC-%Pred-Pre: 106 %
FVC-%Change-Post: -1 %
FVC-%Pred-Post: 63 %
FVC-%Pred-Pre: 64 %
FVC-Post: 2.86 L
FVC-Pre: 2.9 L
Post FEV1/FVC ratio: 73 %
Post FEV6/FVC ratio: 99 %
Pre FEV1/FVC ratio: 77 %
Pre FEV6/FVC Ratio: 100 %
RV % pred: 129 %
RV: 3.3 L
TLC % pred: 78 %
TLC: 5.67 L

## 2015-02-12 LAB — PHOSPHORUS: Phosphorus: 3.1 mg/dL (ref 2.5–4.6)

## 2015-02-12 LAB — PROTIME-INR
INR: 1.26 (ref 0.00–1.49)
Prothrombin Time: 15.9 seconds — ABNORMAL HIGH (ref 11.6–15.2)

## 2015-02-12 LAB — PREALBUMIN: Prealbumin: 10.9 mg/dL — ABNORMAL LOW (ref 18–38)

## 2015-02-12 LAB — MAGNESIUM: Magnesium: 1.5 mg/dL — ABNORMAL LOW (ref 1.7–2.4)

## 2015-02-12 MED ORDER — ALBUTEROL SULFATE (2.5 MG/3ML) 0.083% IN NEBU: 2.5000 mg | INHALATION_SOLUTION | Freq: Once | RESPIRATORY_TRACT | Status: DC

## 2015-02-12 NOTE — Progress Notes (Signed)
[facility] PROGRESS NOTE  Interim History: 74 year old male who presented to [facility] hospital for syncopal episode and bradycardia which was later attribute to vasovagal response. Would have persisting epigastric pain with nausea. In the ED at [facility] a CT scan of the abdomen and pelvis show lipase was greater than 300, LFTs were elevated lactic acid of 2.2 with a creatinine 1.7 and he was transferred to [facility]. ECHO was done that showed an ejection fraction of 30-40% with basal mid inferior severe hypokinesia. Cardiology was consulted and on 01/17/2015 left heart cath showed severe triple-vessel disease. CT surgery was consulted and recommended bypass surgery once nutrition status improved and pancreatitis resolved. CT scan of the abdomen and pelvis showed edema around the pancreas compatible with pancreatitis without abscess. Imaging showed choledocholithiasis with stone in common bile duct and pleural effusion which was thought to be the etiology. General surgery was consulted and recommending cholecystectomy once cardiac issues resolve. Started having black stool and found to be in hemorrhagic shock and had to be transfused several unit of packed red blood cells. Transferred to the ICU for acute respiratory distress intubated and PCCM took over with a chest x-ray which revealed bilateral consolidation. An EGD was performed that showed a grade 4 esophagitis with deep ulcers and friable mucosa. Anticoagulants were held along with aspirin. He is only on Lovenox for DVT prophylaxis. Chest x-ray on 518 showed improving aeration and  was extubated. Lower extremity Doppler done on 01/24/2015 showed a bilateral lower extremity DVTs and an IVC filter was placed. TRH took over on 5/20. Patient's clinical condition started to slowly improve, his CBC remained stable without further bleeding, his nutritional status is improving and now TCV is planning for cardiac surgery on 6/9.  Significant  Events: 5/08 Transfer from [place] after syncopal episode, gallstones, pancreatitis, elevated troponin I, abnormal echocardiogram 5/09 TTE: EF 30-40%, akinesis of basal-midinferior myocardium - severe hypokinesis lateral myocardium - grade 1 DD 5/12 LHC: severe 3 vessel disease  5/13 CT ABD/Pelvis: infiltration edema around pancreas, c/w acute pancreatitis, no abscess, cholelithiasis with stones in CBD, bilateral pleural effusions with compressive atx. 5/15 transfused one unit PRBCs 5/16 Transferred to ICU with acute respiratory distress. Intubated. PCCM consultation. CXR revealed BLL consolidation 5/16 transfused one unit PRBCs 5/16 EGD: grade 4 esophagitis with deep ulcers, eschar, friability, blood clllots in stomach, no gastric ulcers or mucosal abnormalities, mod edema of 2nd portion of duodenum c/w pancreatitis. PPI infusion initiated. Heparin disconitnued. OGT left out 5/17 transfused one unit PRBCs for Hgb 7.4 in setting of ACS 5/18 CXR with improved basilar aeration. Extubated 5/19 LE venous duplex scans + B LE DVT 5/19 IVC filter placed  5/31 repeat EGD by Dr. Juanda Chance 6/9 - planned CABG  HPI/Subjective: - doing well, no changes  - denies shortness of breath, chronic cough unchanged, no sputum  Assessment/Plan:  NSTEMI with severe triple-vessel disease/chronic systolic heart failure/ischemic cardiomyopathy: - Cardiac cath revealed severe triple-vessel disease, Cardiology and TCTS suggest the patient not leave the hospital though patient will need to fully recover from his severe acute pancreatitis prior to surgery. - Continue Coreg. Further titration per cardiology. - His Cr cont to improved - keep Hb > 8, stable today  - monitor strict I's and O's. - plan for CABG in 2 days,  - nutritional status improving, his pre-albumin is now improved to 10 - no chest pain today   Hemorrhagic shock/acute blood loss anemia due to erosive esophagitis: - EGD done showed esophagitis,  hemorraghic shock due to esophagitis plus anti-coagulation, now resolved status post 5 units of packed red blood cells. Keep hemoglobin closer to 9.  - repeated EGD on 5.31.2016 showed healing ulcer. GI recommended to add carafate. - Continue PPI twice a day. - ferritin high, B12 high - biopsy during EGD without evidence of malignancy, chronic reflux - Hb stable  Acute gallstone pancreatitis: - MRCP suggested the possibility of a 4 mm stone within the common bile duct. - GI was consulted which recommended laparoscopic cholecystectomy with intraoperative cholangiogram. - Surgery rec cholecystectomy as an outpatient. - He follow-up with surgery as an outpatient after surgery triple-vessel disease problem resolved. - tolerating diet well, no abdominal pain  Acute bilateral lower extremity DVT: - IVC filter was placed. - will not be a candidate for anticoagulation due to GI bleeding for the next 1 week at least per GI  Ileus: - Resolved with conservative management. - with intermittent diarrhea, C diff negative x 2 - tolerating diet well  Acute kidney injury: - Renal ultrasound showed no acute findings.  - continue Lasix  - Cr stable today   Hypocalcemia: - Corrected calcium is actually 9. No further interventions.  Persisting hypokalemia: - recheck K in am  Hypophosphatemia: - Has normalized with supplementation.  Syncopal episode most likely vasovagal: - No further episodes.  Bradycardia: - Felt to be vaguely mediated now resolved.  Thrombocytopenia  - HIT panel negative - suspect due to acute inflam illness - no evidence of acute blood loss - platelet count has normalized  Hypotension > hypertension - Hypotension resolved with volume expansion - blood pressure controlled  DM - Blood glucose well controlled continue Lantus plus sliding scale insulin.  Code Status: FULL Family Communication: discussed with his wife and daughter bedside Disposition Plan: CABG on  6/9  Consultants:  [facility]  CVTS  Cardiology  Antibiotics: Zosyn 5/12 > 5/13 Vancomycin 5/16 > 5/19 Primaxin 5/16 > 5/25  Objective: Filed Vitals:   02/11/15 1400 02/11/15 2017 02/12/15 0540 02/12/15 1358  BP: 104/47 117/47 115/60 127/55  Pulse: 59 78 78 82  Temp: 98.3 F (36.8 C) 98.5 F (36.9 C) 97.8 F (36.6 C) 97.2 F (36.2 C)  TempSrc: Oral Oral Oral Oral  Resp: Height:      Weight:   78.9 kg (173 lb 15.1 oz)   SpO2: 94% 98% 98% 100%    Intake/Output Summary (Last 24 hours) at 02/12/15 1613 Last data filed at 02/12/15 1300  Gross per 24 hour  Intake    640 ml  Output      0 ml  Net    640 ml   Filed Weights   02/10/15 0526 02/11/15 0640 02/12/15 0540  Weight: 79.107 kg (174 lb 6.4 oz) 78.8 kg (173 lb 11.6 oz) 78.9 kg (173 lb 15.1 oz)   Exam: General: Alert, awake, oriented x3, in no acute distress.  HEENT: No bruits, no goiter.  Heart: Regular rate and rhythm. 2+ pitting lower ext edema. Lungs: Good air movement, clear Abdomen: Soft, nontender, nondistended, positive bowel sounds.   Data Reviewed: Basic Metabolic Panel:  Recent Labs Lab 02/06/15 02/07/15 0520 02/08/15 0540 02/10/15 0553 02/12/15 0507  NA 140 139 141 140 137  K 4.3 4.0 3.8 3.3* 3.6  CL 106 103 103 103 102  CO2 GLUCOSE 225* 153* 72 81 132*  BUN 21* CREATININE 1.35* 1.35* 1.36* 1.34* 1.36*  CALCIUM 7.4* 7.3* 7.5* 7.2* 7.3*  MG  --   --   --   --  1.5*  PHOS  --   --   --   --  3.1   Liver Function Tests:  Recent Labs Lab 02/07/15 0520 02/08/15 0540 02/12/15 0507  AST 40 45* 28  ALT 36 38 28  ALKPHOS 67 71 67  BILITOT 0.6 0.7 0.8  PROT 5.3* 5.4* 5.7*  ALBUMIN 1.6* 1.7* 1.8*   CBC:  Recent Labs Lab 02/06/15 0237 02/07/15 0520 02/08/15 0540 02/10/15 0553 02/12/15 0507  WBC 6.4 7.7 7.8 6.7 6.9  HGB 9.2* 9.0* 9.3* 8.9* 8.5*  HCT 29.3* 28.6* 30.1* 28.2* 26.8*  MCV 89.3 89.4 89.6 88.1 89.0  PLT 165 150 150 163 143*     CBG:  Recent Labs Lab 02/11/15 1121 02/11/15 1633 02/11/15 2045 02/12/15 0625 02/12/15 1107  GLUCAP 158* 181* 159* 143* 161*    Recent Results (from the past 240 hour(s))  Surgical pcr screen     Status: None   Collection Time: 02/11/15  8:41 PM  Result Value Ref Range Status   MRSA, PCR NEGATIVE NEGATIVE Final   Staphylococcus aureus NEGATIVE NEGATIVE Final    Comment:        The Xpert SA Assay (FDA approved for NASAL specimens in patients over 68 years of age), is one component of a comprehensive surveillance program.  Test performance has been validated by [facility] for patients greater than or equal to 39 year old. It is not intended to diagnose infection nor to guide or monitor treatment.      Studies: Dg Chest 2 View  02/12/2015   CLINICAL DATA:  Shortness of breath, gallstone pancreatitis CHF and acute MI  EXAM: CHEST  2 VIEW  COMPARISON:  PA and lateral chest of February 11, 2015  FINDINGS: The lungs are well-expanded. There is persistent barium within right middle and bilateral lower lobe bronchi. There is no alveolar pneumonia. There are small bilateral pleural effusion. The heart and pulmonary vascularity are normal. The bony thorax is unremarkable.  IMPRESSION: Stable appearance of the chest since yesterday's study. There is evidence of previously aspirated barium into the lower airways bilaterally. There remain small pleural effusions.   Electronically Signed   By: David  [place] M.D.   On: 02/12/2015 07:22   Dg Chest 2 View  02/11/2015   CLINICAL DATA:  74 year old male with shortness of breath and abnormal pulmonary auscultation. Initial encounter.  EXAM: CHEST  2 VIEW  COMPARISON:  Chest radiographs 02/03/2015 and earlier. Barium swallow 02/06/2015.  FINDINGS: Aspirated barium in left greater than right lung lower and middle lobe airways. Small bilateral pleural effusions are stable to slightly smaller. No superimposed pneumothorax, pulmonary edema, or other  acute pulmonary opacity. Normal cardiac size and mediastinal contours. No acute osseous abnormality identified.  IMPRESSION: 1. Stable the slightly decreased small bilateral pleural effusions. 2. Lower lobe/middle lobe aspirated barium, see also Esophagram report 02/06/2015.   Electronically Signed   By: Odessa Fleming M.D.   On: 02/11/2015 07:54    Scheduled Meds: . albuterol  2.5 mg Nebulization Once  . carvedilol  6.25 mg Oral BID WC  . feeding supplement (PRO-STAT SUGAR FREE 64)  30 mL Oral Q supper  . feeding supplement (RESOURCE BREEZE)  1 Container Oral BID BM  . furosemide  40 mg Intravenous Daily  . insulin aspart  0-20 Units Subcutaneous TID WC  . insulin glargine  10 Units Subcutaneous  BID  . latanoprost  1 drop Both Eyes QHS  . pantoprazole (PROTONIX) IV  40 mg Intravenous Q12H  . saccharomyces boulardii  250 mg Oral BID  . sucralfate  1 g Oral 4 times per day   Continuous Infusions:    Pamella Pert  [facility] Pager [phone]. If 7PM-7AM, please contact night-coverage at [email or web address], password [facility] 02/12/2015, 4:13 PM  LOS: 30 days

## 2015-02-12 NOTE — Progress Notes (Signed)
UR Completed. Rashaan Wyles, RN, BSN.  [phone] 

## 2015-02-12 NOTE — Progress Notes (Signed)
Subjective:  No current complaints. Sitting up in chair. No bleeding.  Objective:  Vital Signs in the last 24 hours: Temp:  [97.2 F (36.2 C)-98.5 F (36.9 C)] 97.2 F (36.2 C) (06/07 1358) Pulse Rate:  [78-82] 82 (06/07 1358) Resp:  [18] 18 (06/07 1358) BP: (115-127)/(47-60) 127/55 mmHg (06/07 1358) SpO2:  [98 %-100 %] 100 % (06/07 1358) Weight:  [173 lb 15.1 oz (78.9 kg)] 173 lb 15.1 oz (78.9 kg) (06/07 0540)  Intake/Output from previous day: 06/06 0701 - 06/07 0700 In: 840 [P.O.:840] Out: -    Physical Exam: General: Well developed, well nourished, in no acute distress. Head:  Normocephalic and atraumatic. Lungs: Clear to auscultation and percussion. Heart: Normal S1 and S2.  No murmur, rubs or gallops.  Abdomen: soft, mildly distended, non-tender, positive bowel sounds. Extremities: No clubbing or cyanosis. 1+ lower extremity edema.  Neurologic: Alert and oriented x 3.    Lab Results:  Recent Labs  02/10/15 0553 02/12/15 0507  WBC 6.7 6.9  HGB 8.9* 8.5*  PLT 163 143*    Recent Labs  02/10/15 0553 02/12/15 0507  NA 140 137  K 3.3* 3.6  CL 103 102  CO2 29 29  GLUCOSE 81 132*  BUN 17 18  CREATININE 1.34* 1.36*   No results for input(s): TROPONINI in the last 72 hours.  Invalid input(s): CK, MB Hepatic Function Panel  Recent Labs  02/12/15 0507  PROT 5.7*  ALBUMIN 1.8*  AST 28  ALT 28  ALKPHOS 67  BILITOT 0.8   No results for input(s): CHOL in the last 72 hours. No results for input(s): PROTIME in the last 72 hours.  Imaging: Dg Chest 2 View  02/12/2015   CLINICAL DATA:  Shortness of breath, gallstone pancreatitis CHF and acute MI  EXAM: CHEST  2 VIEW  COMPARISON:  PA and lateral chest of February 11, 2015  FINDINGS: The lungs are well-expanded. There is persistent barium within right middle and bilateral lower lobe bronchi. There is no alveolar pneumonia. There are small bilateral pleural effusion. The heart and pulmonary vascularity are  normal. The bony thorax is unremarkable.  IMPRESSION: Stable appearance of the chest since yesterday's study. There is evidence of previously aspirated barium into the lower airways bilaterally. There remain small pleural effusions.   Electronically Signed   By: David  [place] M.D.   On: 02/12/2015 07:22   Dg Chest 2 View  02/11/2015   CLINICAL DATA:  74 year old male with shortness of breath and abnormal pulmonary auscultation. Initial encounter.  EXAM: CHEST  2 VIEW  COMPARISON:  Chest radiographs 02/03/2015 and earlier. Barium swallow 02/06/2015.  FINDINGS: Aspirated barium in left greater than right lung lower and middle lobe airways. Small bilateral pleural effusions are stable to slightly smaller. No superimposed pneumothorax, pulmonary edema, or other acute pulmonary opacity. Normal cardiac size and mediastinal contours. No acute osseous abnormality identified.  IMPRESSION: 1. Stable the slightly decreased small bilateral pleural effusions. 2. Lower lobe/middle lobe aspirated barium, see also Esophagram report 02/06/2015.   Electronically Signed   By: Odessa Fleming M.D.   On: 02/11/2015 07:54   Personally viewed.   Telemetry: No adverse rhythms Personally viewed.   Cardiac Studies:  Cardiac catheterization with severe three-vessel coronary artery disease with  left ventricular dysfunction  Assessment/Plan:  Principal Problem:   Acute gallstone pancreatitis Active Problems:   Syncope   AKI (acute kidney injury)   Diabetes   Pancreatitis   NSTEMI (non-ST elevated myocardial infarction)  Cardiomyopathy   Ileus   Acute biliary pancreatitis   Acute systolic congestive heart failure   Essential hypertension   CAD, multiple vessel: CTO of RCA, Cx with severe prox LAD disease   Acute blood loss anemia   Hemorrhagic shock   Septic shock   Hematemesis with nausea   Erosive esophagitis   HCAP (healthcare-associated pneumonia)   DVT (deep venous thrombosis)   Gastroesophageal reflux disease with  esophagitis   Severe coronary artery disease  - Bypass surgery Thursday  - Will likely get aspirin morning of surgery.  - Daughter asked questions about anticoagulation given his history of bleeding.  GI bleed with erosive gastritis  - Consider anticoagulation once okay from a GI standpoint.  Bilateral DVT status post IVC filter  - As above, not on it quite relation because of GI bleed.  Ischemic cardiomyopathy  - Add ACE inhibitor postop once renal function stabilized  - Continue carvedilol  - Consider ICD in future if LV function does not improve.  We will continue to follow.  [name] [name] 02/12/2015, 2:01 PM

## 2015-02-13 ENCOUNTER — Encounter (HOSPITAL_COMMUNITY): Payer: Self-pay | Admitting: Certified Registered Nurse Anesthetist

## 2015-02-13 ENCOUNTER — Ambulatory Visit: Payer: Medicare Other | Admitting: Cardiothoracic Surgery

## 2015-02-13 LAB — COMPREHENSIVE METABOLIC PANEL
ALBUMIN: 1.9 g/dL — AB (ref 3.5–5.0)
ALT: 26 U/L (ref 17–63)
AST: 30 U/L (ref 15–41)
Alkaline Phosphatase: 69 U/L (ref 38–126)
Anion gap: 9 (ref 5–15)
BUN: 18 mg/dL (ref 6–20)
CALCIUM: 7.5 mg/dL — AB (ref 8.9–10.3)
CHLORIDE: 102 mmol/L (ref 101–111)
CO2: 25 mmol/L (ref 22–32)
Creatinine, Ser: 1.3 mg/dL — ABNORMAL HIGH (ref 0.61–1.24)
GFR calc Af Amer: >60 mL/min (ref >60)
GFR calc non Af Amer: 53 mL/min — ABNORMAL LOW (ref >60)
Glucose, Bld: 116 mg/dL — ABNORMAL HIGH (ref 65–99)
POTASSIUM: 3.4 mmol/L — AB (ref 3.5–5.1)
Sodium: 136 mmol/L (ref 135–145)
Total Bilirubin: 0.9 mg/dL (ref 0.3–1.2)
Total Protein: 5.7 g/dL — ABNORMAL LOW (ref 6.5–8.1)

## 2015-02-13 LAB — GLUCOSE, CAPILLARY
GLUCOSE-CAPILLARY: 193 mg/dL — AB (ref 65–99)
Glucose-Capillary: 108 mg/dL — ABNORMAL HIGH (ref 65–99)
Glucose-Capillary: 192 mg/dL — ABNORMAL HIGH (ref 65–99)
Glucose-Capillary: 185 mg/dL — ABNORMAL HIGH (ref 65–99)

## 2015-02-13 LAB — CBC
HCT: 27.5 % — ABNORMAL LOW (ref 39.0–52.0)
Hemoglobin: 8.6 g/dL — ABNORMAL LOW (ref 13.0–17.0)
MCH: 27.6 pg (ref 26.0–34.0)
MCHC: 31.3 g/dL (ref 30.0–36.0)
MCV: 88.1 fL (ref 78.0–100.0)
Platelets: 174 10*3/uL (ref 150–400)
RBC: 3.12 MIL/uL — AB (ref 4.22–5.81)
RDW: 14.6 % (ref 11.5–15.5)
WBC: 7 10*3/uL (ref 4.0–10.5)

## 2015-02-13 LAB — PREPARE RBC (CROSSMATCH)

## 2015-02-13 LAB — MAGNESIUM: Magnesium: 1.5 mg/dL — ABNORMAL LOW (ref 1.7–2.4)

## 2015-02-13 LAB — PHOSPHORUS: Phosphorus: 3 mg/dL (ref 2.5–4.6)

## 2015-02-13 MED ORDER — DEXTROSE 5 % IV SOLN
1.5000 g | INTRAVENOUS | Status: AC
  Administered 2015-02-14: .75 g via INTRAVENOUS
  Administered 2015-02-14: 1.5 g via INTRAVENOUS
  Filled 2015-02-13 (×2): qty 1.5

## 2015-02-13 MED ORDER — DOPAMINE-DEXTROSE 3.2-5 MG/ML-% IV SOLN
0.0000 ug/kg/min | INTRAVENOUS | Status: DC
  Filled 2015-02-13: qty 250

## 2015-02-13 MED ORDER — PHENYLEPHRINE HCL 10 MG/ML IJ SOLN
30.0000 ug/min | INTRAMUSCULAR | Status: AC
  Administered 2015-02-14: 10 ug/min via INTRAVENOUS
  Filled 2015-02-13: qty 2

## 2015-02-13 MED ORDER — DEXMEDETOMIDINE HCL IN NACL 400 MCG/100ML IV SOLN
0.1000 ug/kg/h | INTRAVENOUS | Status: AC
  Administered 2015-02-14: .4 ug/kg/h via INTRAVENOUS
  Filled 2015-02-13: qty 100

## 2015-02-13 MED ORDER — PLASMA-LYTE 148 IV SOLN
INTRAVENOUS | Status: AC
  Administered 2015-02-14: 500 mL
  Filled 2015-02-13: qty 2.5

## 2015-02-13 MED ORDER — DIAZEPAM 5 MG PO TABS
5.0000 mg | ORAL_TABLET | Freq: Once | ORAL | Status: AC
  Administered 2015-02-14: 5 mg via ORAL
  Filled 2015-02-13: qty 1

## 2015-02-13 MED ORDER — CHLORHEXIDINE GLUCONATE 4 % EX LIQD
60.0000 mL | Freq: Once | CUTANEOUS | Status: AC
  Administered 2015-02-13: 4 via TOPICAL
  Filled 2015-02-13: qty 60

## 2015-02-13 MED ORDER — POTASSIUM CHLORIDE 20 MEQ PO PACK
40.0000 meq | PACK | Freq: Once | ORAL | Status: AC
  Administered 2015-02-13: 40 meq via ORAL
  Filled 2015-02-13: qty 2

## 2015-02-13 MED ORDER — SODIUM CHLORIDE 0.9 % IV SOLN
INTRAVENOUS | Status: DC
  Filled 2015-02-13: qty 30

## 2015-02-13 MED ORDER — CHLORHEXIDINE GLUCONATE 4 % EX LIQD
60.0000 mL | Freq: Once | CUTANEOUS | Status: AC
  Administered 2015-02-14: 4 via TOPICAL
  Filled 2015-02-13 (×2): qty 60

## 2015-02-13 MED ORDER — DEXTROSE 5 % IV SOLN
750.0000 mg | INTRAVENOUS | Status: DC
  Filled 2015-02-13: qty 750

## 2015-02-13 MED ORDER — NITROGLYCERIN IN D5W 200-5 MCG/ML-% IV SOLN
2.0000 ug/min | INTRAVENOUS | Status: AC
  Administered 2015-02-14: 5 ug/min via INTRAVENOUS
  Filled 2015-02-13: qty 250

## 2015-02-13 MED ORDER — SODIUM CHLORIDE 0.9 % IV SOLN
INTRAVENOUS | Status: AC
  Administered 2015-02-14: 1 [IU]/h via INTRAVENOUS
  Administered 2015-02-14: .9 [IU]/h via INTRAVENOUS
  Filled 2015-02-13: qty 2.5

## 2015-02-13 MED ORDER — METOPROLOL TARTRATE 12.5 MG HALF TABLET
12.5000 mg | ORAL_TABLET | Freq: Once | ORAL | Status: AC
  Administered 2015-02-14: 12.5 mg via ORAL
  Filled 2015-02-13: qty 1

## 2015-02-13 MED ORDER — BISACODYL 5 MG PO TBEC
5.0000 mg | DELAYED_RELEASE_TABLET | Freq: Once | ORAL | Status: AC
  Administered 2015-02-13: 5 mg via ORAL
  Filled 2015-02-13: qty 1

## 2015-02-13 MED ORDER — POTASSIUM CHLORIDE 2 MEQ/ML IV SOLN
80.0000 meq | INTRAVENOUS | Status: DC
  Filled 2015-02-13: qty 40

## 2015-02-13 MED ORDER — POTASSIUM CHLORIDE CRYS ER 20 MEQ PO TBCR
40.0000 meq | EXTENDED_RELEASE_TABLET | Freq: Once | ORAL | Status: AC
  Administered 2015-02-13: 40 meq via ORAL
  Filled 2015-02-13: qty 2

## 2015-02-13 MED ORDER — TEMAZEPAM 15 MG PO CAPS
15.0000 mg | ORAL_CAPSULE | Freq: Once | ORAL | Status: AC | PRN
  Administered 2015-02-13: 15 mg via ORAL
  Filled 2015-02-13: qty 1

## 2015-02-13 MED ORDER — VANCOMYCIN HCL 10 G IV SOLR
1250.0000 mg | INTRAVENOUS | Status: AC
  Administered 2015-02-14: 1250 mg via INTRAVENOUS
  Filled 2015-02-13 (×2): qty 1250

## 2015-02-13 MED ORDER — SODIUM CHLORIDE 0.9 % IV SOLN
INTRAVENOUS | Status: AC
  Administered 2015-02-14: 69.8 mL/h via INTRAVENOUS
  Filled 2015-02-13: qty 40

## 2015-02-13 MED ORDER — MAGNESIUM SULFATE 50 % IJ SOLN
40.0000 meq | INTRAMUSCULAR | Status: DC
  Filled 2015-02-13: qty 10

## 2015-02-13 MED ORDER — EPINEPHRINE HCL 1 MG/ML IJ SOLN
0.0000 ug/min | INTRAVENOUS | Status: AC
  Administered 2015-02-14: 4 ug/min via INTRAVENOUS
  Filled 2015-02-13: qty 4

## 2015-02-13 NOTE — Progress Notes (Signed)
[facility] PROGRESS NOTE  Interim History: 74 year old male who presented to [facility] hospital for syncopal episode and bradycardia which was later attribute to vasovagal response. Would have persisting epigastric pain with nausea. In the ED at [facility] a CT scan of the abdomen and pelvis show lipase was greater than 300, LFTs were elevated lactic acid of 2.2 with a creatinine 1.7 and he was transferred to [facility]. ECHO was done that showed an ejection fraction of 30-40% with basal mid inferior severe hypokinesia. Cardiology was consulted and on 01/17/2015 left heart cath showed severe triple-vessel disease. CT surgery was consulted and recommended bypass surgery once nutrition status improved and pancreatitis resolved. CT scan of the abdomen and pelvis showed edema around the pancreas compatible with pancreatitis without abscess. Imaging showed choledocholithiasis with stone in common bile duct and pleural effusion which was thought to be the etiology. General surgery was consulted and recommending cholecystectomy once cardiac issues resolve. Started having black stool and found to be in hemorrhagic shock and had to be transfused several unit of packed red blood cells. Transferred to the ICU for acute respiratory distress intubated and PCCM took over with a chest x-ray which revealed bilateral consolidation. An EGD was performed that showed a grade 4 esophagitis with deep ulcers and friable mucosa. Anticoagulants were held along with aspirin. He is only on Lovenox for DVT prophylaxis. Chest x-ray on 518 showed improving aeration and  was extubated. Lower extremity Doppler done on 01/24/2015 showed a bilateral lower extremity DVTs and an IVC filter was placed. TRH took over on 5/20. Patient's clinical condition started to slowly improve, his CBC remained stable without further bleeding, his nutritional status is improving and now TCV is planning for cardiac surgery on 6/9.  Significant  Events: 5/08 Transfer from [place] after syncopal episode, gallstones, pancreatitis, elevated troponin I, abnormal echocardiogram 5/09 TTE: EF 30-40%, akinesis of basal-midinferior myocardium - severe hypokinesis lateral myocardium - grade 1 DD 5/12 LHC: severe 3 vessel disease  5/13 CT ABD/Pelvis: infiltration edema around pancreas, c/w acute pancreatitis, no abscess, cholelithiasis with stones in CBD, bilateral pleural effusions with compressive atx. 5/15 transfused one unit PRBCs 5/16 Transferred to ICU with acute respiratory distress. Intubated. PCCM consultation. CXR revealed BLL consolidation 5/16 transfused one unit PRBCs 5/16 EGD: grade 4 esophagitis with deep ulcers, eschar, friability, blood clllots in stomach, no gastric ulcers or mucosal abnormalities, mod edema of 2nd portion of duodenum c/w pancreatitis. PPI infusion initiated. Heparin disconitnued. OGT left out 5/17 transfused one unit PRBCs for Hgb 7.4 in setting of ACS 5/18 CXR with improved basilar aeration. Extubated 5/19 LE venous duplex scans + B LE DVT 5/19 IVC filter placed  5/31 repeat EGD by Dr. Juanda Chance 6/9 - planned CABG  HPI/Subjective: -Patient sitting at bedside chair, has no complaints. He can he denies shortness of breath or chest pain, states tolerating by mouth intake. Plan for coronary artery bypass grafting in a.m.  Assessment/Plan:  NSTEMI with severe triple-vessel disease/chronic systolic heart failure/ischemic cardiomyopathy: - Cardiac cath revealed severe triple-vessel disease, Cardiology and TCTS suggest the patient not leave the hospital though patient will need to fully recover from his severe acute pancreatitis prior to surgery. - Continue Coreg. Further titration per cardiology. - His Cr cont to improved - keep Hb > 8, stable today  - monitor strict I's and O's. -Currently chest pain-free, plan for coronary artery bypass grafting on 02/14/2015  Hemorrhagic shock/acute blood loss anemia  due to erosive esophagitis: - EGD done showed  esophagitis, hemorraghic shock due to esophagitis plus anti-coagulation, now resolved status post 5 units of packed red blood cells. Keep hemoglobin closer to 9.  - repeated EGD on 5.31.2016 showed healing ulcer. GI recommended to add carafate. - Continue PPI twice a day. - ferritin high, B12 high - biopsy during EGD without evidence of malignancy, chronic reflux -Hemoglobin stable at 8.6 on a.m. lab work. Has not had further GI bleeds  Acute gallstone pancreatitis: - MRCP suggested the possibility of a 4 mm stone within the common bile duct. - GI was consulted which recommended laparoscopic cholecystectomy with intraoperative cholangiogram. - Surgery rec cholecystectomy as an outpatient. - He follow-up with surgery as an outpatient after surgery triple-vessel disease problem resolved. - tolerating diet well, no abdominal pain  Acute bilateral lower extremity DVT: - IVC filter was placed. - will not be a candidate for anticoagulation due to GI bleeding for the next 1 week at least per GI  Ileus: - Resolved with conservative management. - with intermittent diarrhea, C diff negative x 2 - tolerating diet well  Acute kidney injury: - Renal ultrasound showed no acute findings.  - continue Lasix  - Cr stable today   Thrombocytopenia  - HIT panel negative - suspect due to acute inflam illness - no evidence of acute blood loss - platelet count has normalized  Hypotension > hypertension - Hypotension resolved with volume expansion - blood pressure controlled  DM -Continue Lantus 10 units simultaneous daily  Code Status: FULL Family Communication: discussed with his wife and daughter bedside Disposition Plan: CABG on 6/9  Consultants:  [facility]  CVTS  Cardiology  Antibiotics: Zosyn 5/12 > 5/13 Vancomycin 5/16 > 5/19 Primaxin 5/16 > 5/25  Objective: Filed Vitals:   02/12/15 1358 02/12/15 2122 02/13/15 0621 02/13/15 1335    BP: 127/55 112/49 120/54 111/50  Pulse: 82 74 78 78  Temp: 97.2 F (36.2 C) 98.6 F (37 C) 98.4 F (36.9 C) 98.5 F (36.9 C)  TempSrc: Oral Oral Oral Oral  Resp: 18 18 18 17   Height:      Weight:   78.2 kg (172 lb 6.4 oz)   SpO2: 100% 99% 98% 100%    Intake/Output Summary (Last 24 hours) at 02/13/15 1557 Last data filed at 02/13/15 1300  Gross per 24 hour  Intake    960 ml  Output      0 ml  Net    960 ml   Filed Weights   02/11/15 0640 02/12/15 0540 02/13/15 0621  Weight: 78.8 kg (173 lb 11.6 oz) 78.9 kg (173 lb 15.1 oz) 78.2 kg (172 lb 6.4 oz)   Exam: General: Alert, awake, oriented x3, in no acute distress.  HEENT: No bruits, no goiter.  Heart: Regular rate and rhythm. 2+ pitting lower ext edema. Lungs: Good air movement, clear Abdomen: Soft, nontender, nondistended, positive bowel sounds.   Data Reviewed: Basic Metabolic Panel:  Recent Labs Lab 02/07/15 0520 02/08/15 0540 02/10/15 0553 02/12/15 0507 02/13/15 0426  NA 139 141 140 137 136  K 4.0 3.8 3.3* 3.6 3.4*  CL 103 103 103 102 102  CO2 27 28 29 29 25   GLUCOSE 153* 72 81 132* 116*  BUN 18 16 17 18 18   CREATININE 1.35* 1.36* 1.34* 1.36* 1.30*  CALCIUM 7.3* 7.5* 7.2* 7.3* 7.5*  MG  --   --   --  1.5* 1.5*  PHOS  --   --   --  3.1 3.0   Liver Function Tests:  Recent Labs Lab 02/07/15 0520 02/08/15 0540 02/12/15 0507 02/13/15 0426  AST 40 45* 28 30  ALT 36 38 28 26  ALKPHOS 67 71 67 69  BILITOT 0.6 0.7 0.8 0.9  PROT 5.3* 5.4* 5.7* 5.7*  ALBUMIN 1.6* 1.7* 1.8* 1.9*   CBC:  Recent Labs Lab 02/07/15 0520 02/08/15 0540 02/10/15 0553 02/12/15 0507 02/13/15 0426  WBC 7.7 7.8 6.7 6.9 7.0  HGB 9.0* 9.3* 8.9* 8.5* 8.6*  HCT 28.6* 30.1* 28.2* 26.8* 27.5*  MCV 89.4 89.6 88.1 89.0 88.1  PLT 150 150 163 143* 174    CBG:  Recent Labs Lab 02/12/15 1107 02/12/15 1609 02/12/15 2103 02/13/15 0628 02/13/15 1124  GLUCAP 161* 143* 126* 108* 192*    Recent Results (from the past 240  hour(s))  Surgical pcr screen     Status: None   Collection Time: 02/11/15  8:41 PM  Result Value Ref Range Status   MRSA, PCR NEGATIVE NEGATIVE Final   Staphylococcus aureus NEGATIVE NEGATIVE Final    Comment:        The Xpert SA Assay (FDA approved for NASAL specimens in patients over 77 years of age), is one component of a comprehensive surveillance program.  Test performance has been validated by [facility] for patients greater than or equal to 70 year old. It is not intended to diagnose infection nor to guide or monitor treatment.      Studies: Dg Chest 2 View  02/12/2015   CLINICAL DATA:  Shortness of breath, gallstone pancreatitis CHF and acute MI  EXAM: CHEST  2 VIEW  COMPARISON:  PA and lateral chest of February 11, 2015  FINDINGS: The lungs are well-expanded. There is persistent barium within right middle and bilateral lower lobe bronchi. There is no alveolar pneumonia. There are small bilateral pleural effusion. The heart and pulmonary vascularity are normal. The bony thorax is unremarkable.  IMPRESSION: Stable appearance of the chest since yesterday's study. There is evidence of previously aspirated barium into the lower airways bilaterally. There remain small pleural effusions.   Electronically Signed   By: David  [place] M.D.   On: 02/12/2015 07:22    Scheduled Meds: . albuterol  2.5 mg Nebulization Once  . [START ON 02/14/2015] aminocaproic acid (AMICAR) for OHS   Intravenous To OR  . bisacodyl  5 mg Oral Once  . carvedilol  6.25 mg Oral BID WC  . [START ON 02/14/2015] cefUROXime (ZINACEF)  IV  1.5 g Intravenous To OR  . [START ON 02/14/2015] cefUROXime (ZINACEF)  IV  750 mg Intravenous To OR  . chlorhexidine  60 mL Topical Once   And  . [START ON 02/14/2015] chlorhexidine  60 mL Topical Once  . [START ON 02/14/2015] dexmedetomidine  0.1-0.7 mcg/kg/hr Intravenous To OR  . [START ON 02/14/2015] diazepam  5 mg Oral Once  . [START ON 02/14/2015] DOPamine  0-10 mcg/kg/min Intravenous To  OR  . [START ON 02/14/2015] epinephrine  0-10 mcg/min Intravenous To OR  . feeding supplement (PRO-STAT SUGAR FREE 64)  30 mL Oral Q supper  . feeding supplement (RESOURCE BREEZE)  1 Container Oral BID BM  . furosemide  40 mg Intravenous Daily  . [START ON 02/14/2015] heparin-papaverine-plasmalyte irrigation   Irrigation To OR  . [START ON 02/14/2015] heparin 30,000 units/NS 1000 mL solution for CELLSAVER   Other To OR  . insulin aspart  0-20 Units Subcutaneous TID WC  . insulin glargine  10 Units Subcutaneous BID  . [START ON 02/14/2015] insulin (  NOVOLIN-R) infusion   Intravenous To OR  . latanoprost  1 drop Both Eyes QHS  . [START ON 02/14/2015] magnesium sulfate  40 mEq Other To OR  . [START ON 02/14/2015] metoprolol tartrate  12.5 mg Oral Once  . [START ON 02/14/2015] nitroGLYCERIN  2-200 mcg/min Intravenous To OR  . pantoprazole (PROTONIX) IV  40 mg Intravenous Q12H  . [START ON 02/14/2015] phenylephrine (NEO-SYNEPHRINE) Adult infusion  30-200 mcg/min Intravenous To OR  . potassium chloride  40 mEq Oral Once  . [START ON 02/14/2015] potassium chloride  80 mEq Other To OR  . saccharomyces boulardii  250 mg Oral BID  . sucralfate  1 g Oral 4 times per day  . [START ON 02/14/2015] vancomycin  1,250 mg Intravenous To OR   Continuous Infusions:    Jeralyn Bennett  [facility] Pager [phone]. If 7PM-7AM, please contact night-coverage at [email or web address], password [facility] 02/13/2015, 3:57 PM  LOS: 31 days

## 2015-02-13 NOTE — Progress Notes (Signed)
Nutrition Follow-up  DOCUMENTATION CODES:  Not applicable  INTERVENTION:   Boost Breeze po TID, each supplement provides 250 kcal and 9 grams of protein   Prostat liquid protein po 30 ml BID with meals, each supplement provides 100 kcal, 15 grams protein  NUTRITION DIAGNOSIS:  Inadequate oral intake related to  (limited appetite) as evidenced by meal completion < 50%, improved  GOAL:  Patient will meet greater than or equal to 90% of their needs, progressing  MONITOR:  PO intake, Supplement acceptance, Labs, Weight trends, Skin, I & O's  ASSESSMENT: Admitted 5/8 after a syncopal episode and found to have acute pancreatitis. Also positive troponin/NSTEMI  Transferred to ICU with acute respiratory distress and intubated. EDG shows grade 4 esophagitis with deep ulcers, eschar, friability. OGT left out. No plans for feeding tube.   Patient continues on a Carbohydrate Modified diet.  PO intake overall improved.  25-100% per flowsheet records.  Receiving Boost Breeze and Prostat liquid supplements.  CT Surgery note reviewed.  Plan for CABG tomorrow AM.  Height:  Ht Readings from Last 1 Encounters:  01/19/15 6' (1.829 m)    Weight:  Wt Readings from Last 1 Encounters:  02/13/15 172 lb 6.4 oz (78.2 kg)    Ideal Body Weight:  80.9 kg  Wt Readings from Last 10 Encounters:  02/13/15 172 lb 6.4 oz (78.2 kg)    BMI:  Body mass index is 23.38 kg/(m^2).  Estimated Nutritional Needs:  Kcal:  2050-2250  Protein:  100-110 grams  Fluid:  2.0-2.2 L  Skin:  Reviewed, no issues  Diet Order:  Diet Carb Modified Fluid consistency:: Thin; Room service appropriate?: Yes  EDUCATION NEEDS:  No education needs identified at this time   Intake/Output Summary (Last 24 hours) at 02/13/15 1525 Last data filed at 02/13/15 1300  Gross per 24 hour  Intake    960 ml  Output      0 ml  Net    960 ml    Last BM:  6/7  Maureen Chatters, RD, LDN Pager #:  [phone] After-Hours Pager #: [phone]

## 2015-02-13 NOTE — Progress Notes (Signed)
8 Days Post-Op Procedure(s) (LRB): ESOPHAGOGASTRODUODENOSCOPY (EGD) (N/A) Subjective: Patient ready for CABG with short term anticoagulation in am Patient understands the indications and risks of CABG I reviewed the details of the procedure with the patient Plan surgery in am Objective: Vital signs in last 24 hours: Temp:  [97.2 F (36.2 C)-98.6 F (37 C)] 98.4 F (36.9 C) (06/08 0621) Pulse Rate:  [74-82] 78 (06/08 0621) Cardiac Rhythm:  [-]  Resp:  [18] 18 (06/08 0621) BP: (112-127)/(49-55) 120/54 mmHg (06/08 0621) SpO2:  [98 %-100 %] 98 % (06/08 0621) Weight:  [172 lb 6.4 oz (78.2 kg)] 172 lb 6.4 oz (78.2 kg) (06/08 0621)  Hemodynamic parameters for last 24 hours:  stable  Intake/Output from previous day: 06/07 0701 - 06/08 0700 In: 1000 [P.O.:1000] Out: -  Intake/Output this shift:    Edema improved Appears much stronger  Lab Results:  Recent Labs  02/12/15 0507 02/13/15 0426  WBC 6.9 7.0  HGB 8.5* 8.6*  HCT 26.8* 27.5*  PLT 143* 174   BMET:  Recent Labs  02/12/15 0507 02/13/15 0426  NA 137 136  K 3.6 3.4*  CL 102 102  CO2 29 25  GLUCOSE 132* 116*  BUN 18 18  CREATININE 1.36* 1.30*  CALCIUM 7.3* 7.5*    PT/INR:  Recent Labs  02/12/15 0507  LABPROT 15.9*  INR 1.26   ABG    Component Value Date/Time   PHART 7.440 01/22/2015 0325   HCO3 17.6* 01/22/2015 0325   TCO2 18.4 01/22/2015 0325   ACIDBASEDEF 5.8* 01/22/2015 0325   O2SAT 58.4 01/23/2015 1105   CBG (last 3)   Recent Labs  02/12/15 1609 02/12/15 2103 02/13/15 0628  GLUCAP 143* 126* 108*    Assessment/Plan: S/P Procedure(s) (LRB): ESOPHAGOGASTRODUODENOSCOPY (EGD) (N/A) CABG in am Plan on intraoperative blood transfusion which was discussed with the patient   LOS: 31 days    Kathlee Nations Trigt III 02/13/2015

## 2015-02-13 NOTE — Progress Notes (Signed)
CARDIAC REHAB PHASE I   Pt already ambulated with family this morning. Reviewed pre op education with pt and family including sternal precautions, IS, activity progression. Gave pt OHS book and guidelines as well as OHS video information.  Pt and family verbalized understanding.  Pt states he has been ambulating regularly with family.  Encouraged to walk again today. Pt up in chair, call bell within reach, family at bedside.  Will follow after surgery.  [id number]  Joylene Grapes, RN, BSN 02/13/2015 10:00 AM

## 2015-02-13 NOTE — Care Management Note (Addendum)
Case Management Note  Patient Details  Name: [name] [name] MRN: [id number] Date of Birth: 08-Sep-1940  Subjective/Objective:  Pt intially admitted for gallstone pancreatitis.       Action/Plan:  Pt has CABG scheduled for 02/14/15.  CM will continue to monitor for disposition needs  Expected Discharge Date:                  Expected Discharge Plan:  Will defer until post CABG  In-House Referral:  CM  Discharge planning Services     Post Acute Care Choice:    Choice offered to:     DME Arranged:    DME Agency:     HH Arranged:    HH Agency:     Status of Service:     Medicare Important Message Given:  Yes Date Medicare IM Given:  02/07/15 Medicare IM give by:  Raynald Blend Date Additional Medicare IM Given:  02/13/15 Additional Medicare Important Message give by:  Raynald Blend  If discussed at Long Length of Stay Meetings, dates discussed:  02/07/15  Additional Comments:  Cherylann Parr, RN 02/13/2015, 5:27 PM

## 2015-02-14 ENCOUNTER — Encounter (HOSPITAL_COMMUNITY)
Admission: AD | Disposition: A | Discharge status: Home Health | Payer: Medicare Other | Source: Other Acute Inpatient Hospital | Attending: Internal Medicine

## 2015-02-14 ENCOUNTER — Inpatient Hospital Stay (HOSPITAL_COMMUNITY): Payer: Medicare Other | Admitting: Certified Registered Nurse Anesthetist

## 2015-02-14 ENCOUNTER — Inpatient Hospital Stay (HOSPITAL_COMMUNITY): Payer: Medicare Other

## 2015-02-14 DIAGNOSIS — I25110 Atherosclerotic heart disease of native coronary artery with unstable angina pectoris: Secondary | ICD-10-CM

## 2015-02-14 DIAGNOSIS — Z951 Presence of aortocoronary bypass graft: Secondary | ICD-10-CM

## 2015-02-14 HISTORY — PX: TEE WITHOUT CARDIOVERSION: SHX5443

## 2015-02-14 HISTORY — PX: CORONARY ARTERY BYPASS GRAFT: SHX141

## 2015-02-14 LAB — POCT I-STAT 3, ART BLOOD GAS (G3+)
Acid-Base Excess: 1 mmol/L (ref 0.0–2.0)
Acid-base deficit: 2 mmol/L (ref 0.0–2.0)
Acid-base deficit: 1 mmol/L (ref 0.0–2.0)
BICARBONATE: 23.9 meq/L (ref 20.0–24.0)
BICARBONATE: 24 meq/L (ref 20.0–24.0)
Bicarbonate: 26.4 mEq/L — ABNORMAL HIGH (ref 20.0–24.0)
Bicarbonate: 24.5 mEq/L — ABNORMAL HIGH (ref 20.0–24.0)
Bicarbonate: 22.9 mEq/L (ref 20.0–24.0)
O2 SAT: 97 %
O2 SAT: 98 %
O2 Saturation: 100 %
O2 Saturation: 100 %
O2 Saturation: 98 %
PH ART: 7.36 (ref 7.350–7.450)
PH ART: 7.472 — AB (ref 7.350–7.450)
PH ART: 7.367 (ref 7.350–7.450)
PO2 ART: 415 mmHg — AB (ref 80.0–100.0)
PO2 ART: 257 mmHg — AB (ref 80.0–100.0)
PO2 ART: 77 mmHg — AB (ref 80.0–100.0)
PO2 ART: 100 mmHg (ref 80.0–100.0)
PO2 ART: 106 mmHg — AB (ref 80.0–100.0)
Patient temperature: 35.5
TCO2: 28 mmol/L (ref 0–100)
TCO2: 26 mmol/L (ref 0–100)
TCO2: 25 mmol/L (ref 0–100)
TCO2: 24 mmol/L (ref 0–100)
TCO2: 25 mmol/L (ref 0–100)
pCO2 arterial: 46.8 mmHg — ABNORMAL HIGH (ref 35.0–45.0)
pCO2 arterial: 37.9 mmHg (ref 35.0–45.0)
pCO2 arterial: 32.2 mmHg — ABNORMAL LOW (ref 35.0–45.0)
pCO2 arterial: 39.7 mmHg (ref 35.0–45.0)
pCO2 arterial: 40.3 mmHg (ref 35.0–45.0)
pH, Arterial: 7.419 (ref 7.350–7.450)
pH, Arterial: 7.383 (ref 7.350–7.450)

## 2015-02-14 LAB — CBC
HCT: 26.1 % — ABNORMAL LOW (ref 39.0–52.0)
HCT: 27.1 % — ABNORMAL LOW (ref 39.0–52.0)
HEMATOCRIT: 30.1 % — AB (ref 39.0–52.0)
Hemoglobin: 8.3 g/dL — ABNORMAL LOW (ref 13.0–17.0)
Hemoglobin: 9.9 g/dL — ABNORMAL LOW (ref 13.0–17.0)
Hemoglobin: 9 g/dL — ABNORMAL LOW (ref 13.0–17.0)
MCH: 27.9 pg (ref 26.0–34.0)
MCH: 28.1 pg (ref 26.0–34.0)
MCH: 28 pg (ref 26.0–34.0)
MCHC: 31.8 g/dL (ref 30.0–36.0)
MCHC: 32.9 g/dL (ref 30.0–36.0)
MCHC: 33.2 g/dL (ref 30.0–36.0)
MCV: 87.6 fL (ref 78.0–100.0)
MCV: 85.5 fL (ref 78.0–100.0)
MCV: 84.4 fL (ref 78.0–100.0)
Platelets: 148 10*3/uL — ABNORMAL LOW (ref 150–400)
Platelets: 130 10*3/uL — ABNORMAL LOW (ref 150–400)
Platelets: 144 10*3/uL — ABNORMAL LOW (ref 150–400)
RBC: 2.98 MIL/uL — ABNORMAL LOW (ref 4.22–5.81)
RBC: 3.52 MIL/uL — ABNORMAL LOW (ref 4.22–5.81)
RBC: 3.21 MIL/uL — ABNORMAL LOW (ref 4.22–5.81)
RDW: 14.5 % (ref 11.5–15.5)
RDW: 14.3 % (ref 11.5–15.5)
RDW: 14.2 % (ref 11.5–15.5)
WBC: 6.2 10*3/uL (ref 4.0–10.5)
WBC: 13.2 10*3/uL — ABNORMAL HIGH (ref 4.0–10.5)
WBC: 12.3 10*3/uL — ABNORMAL HIGH (ref 4.0–10.5)

## 2015-02-14 LAB — POCT I-STAT, CHEM 8
BUN: 16 mg/dL (ref 6–20)
BUN: 14 mg/dL (ref 6–20)
BUN: 15 mg/dL (ref 6–20)
BUN: 15 mg/dL (ref 6–20)
BUN: 15 mg/dL (ref 6–20)
BUN: 15 mg/dL (ref 6–20)
CALCIUM ION: 1.16 mmol/L (ref 1.13–1.30)
CALCIUM ION: 1.16 mmol/L (ref 1.13–1.30)
CHLORIDE: 104 mmol/L (ref 101–111)
CHLORIDE: 100 mmol/L — AB (ref 101–111)
CREATININE: 1 mg/dL (ref 0.61–1.24)
CREATININE: 0.8 mg/dL (ref 0.61–1.24)
CREATININE: 1.1 mg/dL (ref 0.61–1.24)
CREATININE: 0.9 mg/dL (ref 0.61–1.24)
CREATININE: 0.9 mg/dL (ref 0.61–1.24)
CREATININE: 1.2 mg/dL (ref 0.61–1.24)
Calcium, Ion: 0.95 mmol/L — ABNORMAL LOW (ref 1.13–1.30)
Calcium, Ion: 1.17 mmol/L (ref 1.13–1.30)
Calcium, Ion: 0.98 mmol/L — ABNORMAL LOW (ref 1.13–1.30)
Calcium, Ion: 1.05 mmol/L — ABNORMAL LOW (ref 1.13–1.30)
Chloride: 100 mmol/L — ABNORMAL LOW (ref 101–111)
Chloride: 101 mmol/L (ref 101–111)
Chloride: 100 mmol/L — ABNORMAL LOW (ref 101–111)
Chloride: 103 mmol/L (ref 101–111)
GLUCOSE: 112 mg/dL — AB (ref 65–99)
GLUCOSE: 151 mg/dL — AB (ref 65–99)
Glucose, Bld: 103 mg/dL — ABNORMAL HIGH (ref 65–99)
Glucose, Bld: 96 mg/dL (ref 65–99)
Glucose, Bld: 109 mg/dL — ABNORMAL HIGH (ref 65–99)
Glucose, Bld: 115 mg/dL — ABNORMAL HIGH (ref 65–99)
HCT: 22 % — ABNORMAL LOW (ref 39.0–52.0)
HCT: 21 % — ABNORMAL LOW (ref 39.0–52.0)
HCT: 25 % — ABNORMAL LOW (ref 39.0–52.0)
HCT: 27 % — ABNORMAL LOW (ref 39.0–52.0)
HCT: 26 % — ABNORMAL LOW (ref 39.0–52.0)
HEMATOCRIT: 23 % — AB (ref 39.0–52.0)
HEMOGLOBIN: 9.2 g/dL — AB (ref 13.0–17.0)
HEMOGLOBIN: 8.8 g/dL — AB (ref 13.0–17.0)
Hemoglobin: 7.5 g/dL — ABNORMAL LOW (ref 13.0–17.0)
Hemoglobin: 7.1 g/dL — ABNORMAL LOW (ref 13.0–17.0)
Hemoglobin: 7.8 g/dL — ABNORMAL LOW (ref 13.0–17.0)
Hemoglobin: 8.5 g/dL — ABNORMAL LOW (ref 13.0–17.0)
POTASSIUM: 4.2 mmol/L (ref 3.5–5.1)
POTASSIUM: 4.3 mmol/L (ref 3.5–5.1)
Potassium: 3.6 mmol/L (ref 3.5–5.1)
Potassium: 3.4 mmol/L — ABNORMAL LOW (ref 3.5–5.1)
Potassium: 3.6 mmol/L (ref 3.5–5.1)
Potassium: 4.7 mmol/L (ref 3.5–5.1)
Sodium: 140 mmol/L (ref 135–145)
Sodium: 138 mmol/L (ref 135–145)
Sodium: 139 mmol/L (ref 135–145)
Sodium: 137 mmol/L (ref 135–145)
Sodium: 137 mmol/L (ref 135–145)
Sodium: 138 mmol/L (ref 135–145)
TCO2: 26 mmol/L (ref 0–100)
TCO2: 24 mmol/L (ref 0–100)
TCO2: 25 mmol/L (ref 0–100)
TCO2: 24 mmol/L (ref 0–100)
TCO2: 21 mmol/L (ref 0–100)
TCO2: 21 mmol/L (ref 0–100)

## 2015-02-14 LAB — BASIC METABOLIC PANEL
Anion gap: 9 (ref 5–15)
BUN: 18 mg/dL (ref 6–20)
CO2: 26 mmol/L (ref 22–32)
Calcium: 7.6 mg/dL — ABNORMAL LOW (ref 8.9–10.3)
Chloride: 103 mmol/L (ref 101–111)
Creatinine, Ser: 1.33 mg/dL — ABNORMAL HIGH (ref 0.61–1.24)
GFR calc Af Amer: >60 mL/min (ref >60)
GFR calc non Af Amer: 52 mL/min — ABNORMAL LOW (ref >60)
Glucose, Bld: 125 mg/dL — ABNORMAL HIGH (ref 65–99)
Potassium: 3.9 mmol/L (ref 3.5–5.1)
Sodium: 138 mmol/L (ref 135–145)

## 2015-02-14 LAB — PREPARE RBC (CROSSMATCH)

## 2015-02-14 LAB — POCT I-STAT 4, (NA,K, GLUC, HGB,HCT)
GLUCOSE: 133 mg/dL — AB (ref 65–99)
HCT: 31 % — ABNORMAL LOW (ref 39.0–52.0)
HEMOGLOBIN: 10.5 g/dL — AB (ref 13.0–17.0)
POTASSIUM: 3.6 mmol/L (ref 3.5–5.1)
Sodium: 139 mmol/L (ref 135–145)

## 2015-02-14 LAB — PLATELET COUNT: Platelets: 115 10*3/uL — ABNORMAL LOW (ref 150–400)

## 2015-02-14 LAB — GLUCOSE, CAPILLARY
Glucose-Capillary: 129 mg/dL — ABNORMAL HIGH (ref 65–99)
Glucose-Capillary: 113 mg/dL — ABNORMAL HIGH (ref 65–99)
Glucose-Capillary: 93 mg/dL (ref 65–99)
Glucose-Capillary: 68 mg/dL (ref 65–99)
Glucose-Capillary: 98 mg/dL (ref 65–99)
Glucose-Capillary: 110 mg/dL — ABNORMAL HIGH (ref 65–99)
Glucose-Capillary: 127 mg/dL — ABNORMAL HIGH (ref 65–99)
Glucose-Capillary: 127 mg/dL — ABNORMAL HIGH (ref 65–99)

## 2015-02-14 LAB — HEMOGLOBIN AND HEMATOCRIT, BLOOD
HCT: 25 % — ABNORMAL LOW (ref 39.0–52.0)
Hemoglobin: 8.3 g/dL — ABNORMAL LOW (ref 13.0–17.0)

## 2015-02-14 LAB — CREATININE, SERUM
Creatinine, Ser: 1.16 mg/dL (ref 0.61–1.24)
GFR calc Af Amer: >60 mL/min (ref >60)
GFR calc non Af Amer: >60 mL/min (ref >60)

## 2015-02-14 LAB — MAGNESIUM: Magnesium: 2.3 mg/dL (ref 1.7–2.4)

## 2015-02-14 LAB — APTT: aPTT: 36 seconds (ref 24–37)

## 2015-02-14 LAB — PROTIME-INR
INR: 1.49 (ref 0.00–1.49)
Prothrombin Time: 18.1 seconds — ABNORMAL HIGH (ref 11.6–15.2)

## 2015-02-14 SURGERY — CORONARY ARTERY BYPASS GRAFTING (CABG)
Anesthesia: General | Site: Chest

## 2015-02-14 MED ORDER — ACETAMINOPHEN 160 MG/5ML PO SOLN
650.0000 mg | Freq: Once | ORAL | Status: AC
Start: 2015-02-14 — End: 2015-02-14

## 2015-02-14 MED ORDER — OXYCODONE HCL 5 MG PO TABS
5.0000 mg | ORAL_TABLET | ORAL | Status: DC | PRN
  Administered 2015-02-14 – 2015-02-15 (×2): 10 mg via ORAL
  Filled 2015-02-14 (×2): qty 2

## 2015-02-14 MED ORDER — METOPROLOL TARTRATE 12.5 MG HALF TABLET
12.5000 mg | ORAL_TABLET | Freq: Two times a day (BID) | ORAL | Status: DC
  Administered 2015-02-16 – 2015-02-21 (×10): 12.5 mg via ORAL
  Filled 2015-02-14 (×15): qty 1

## 2015-02-14 MED ORDER — MORPHINE SULFATE 2 MG/ML IJ SOLN
2.0000 mg | INTRAMUSCULAR | Status: DC | PRN
  Administered 2015-02-14: 4 mg via INTRAVENOUS
  Administered 2015-02-15: 2 mg via INTRAVENOUS
  Administered 2015-02-16 (×2): 4 mg via INTRAVENOUS
  Administered 2015-02-17: 2 mg via INTRAVENOUS
  Filled 2015-02-14: qty 2
  Filled 2015-02-14: qty 1
  Filled 2015-02-14 (×2): qty 2
  Filled 2015-02-14: qty 1
  Filled 2015-02-14: qty 2

## 2015-02-14 MED ORDER — POTASSIUM CHLORIDE 10 MEQ/50ML IV SOLN
10.0000 meq | Freq: Once | INTRAVENOUS | Status: AC
  Administered 2015-02-14: 10 meq via INTRAVENOUS

## 2015-02-14 MED ORDER — LIDOCAINE HCL (CARDIAC) 20 MG/ML IV SOLN
INTRAVENOUS | Status: AC
  Filled 2015-02-14: qty 5

## 2015-02-14 MED ORDER — SODIUM CHLORIDE 0.9 % IV SOLN
Freq: Once | INTRAVENOUS | Status: AC
  Administered 2015-02-14: 12:00:00 via INTRAVENOUS

## 2015-02-14 MED ORDER — METOCLOPRAMIDE HCL 5 MG/ML IJ SOLN
10.0000 mg | Freq: Four times a day (QID) | INTRAMUSCULAR | Status: AC
  Administered 2015-02-14 – 2015-02-15 (×3): 10 mg via INTRAVENOUS
  Filled 2015-02-14 (×2): qty 2

## 2015-02-14 MED ORDER — ALBUMIN HUMAN 5 % IV SOLN
INTRAVENOUS | Status: AC
  Filled 2015-02-14: qty 500

## 2015-02-14 MED ORDER — CEFUROXIME SODIUM 1.5 G IJ SOLR
1.5000 g | Freq: Two times a day (BID) | INTRAMUSCULAR | Status: AC
  Administered 2015-02-14 – 2015-02-16 (×4): 1.5 g via INTRAVENOUS
  Filled 2015-02-14 (×4): qty 1.5

## 2015-02-14 MED ORDER — ENSURE ENLIVE PO LIQD
237.0000 mL | Freq: Three times a day (TID) | ORAL | Status: DC
  Administered 2015-02-15 – 2015-02-21 (×2): 237 mL via ORAL
  Filled 2015-02-14 (×12): qty 237

## 2015-02-14 MED ORDER — PROPOFOL 10 MG/ML IV BOLUS
INTRAVENOUS | Status: DC | PRN
  Administered 2015-02-14: 10 mg via INTRAVENOUS
  Administered 2015-02-14: 20 mg via INTRAVENOUS
  Administered 2015-02-14: 10 mg via INTRAVENOUS

## 2015-02-14 MED ORDER — CETYLPYRIDINIUM CHLORIDE 0.05 % MT LIQD
7.0000 mL | Freq: Four times a day (QID) | OROMUCOSAL | Status: DC
  Administered 2015-02-14 – 2015-02-16 (×6): 7 mL via OROMUCOSAL

## 2015-02-14 MED ORDER — PROTAMINE SULFATE 10 MG/ML IV SOLN
INTRAVENOUS | Status: AC
  Filled 2015-02-14: qty 25

## 2015-02-14 MED ORDER — INSULIN REGULAR BOLUS VIA INFUSION
0.0000 [IU] | Freq: Three times a day (TID) | INTRAVENOUS | Status: DC
  Filled 2015-02-14: qty 10

## 2015-02-14 MED ORDER — POTASSIUM CHLORIDE 10 MEQ/50ML IV SOLN
10.0000 meq | INTRAVENOUS | Status: AC
  Administered 2015-02-14 (×3): 10 meq via INTRAVENOUS

## 2015-02-14 MED ORDER — DEXTROSE 5 % IV SOLN
0.0000 ug/min | INTRAVENOUS | Status: DC
  Filled 2015-02-14: qty 4

## 2015-02-14 MED ORDER — MILRINONE IN DEXTROSE 20 MG/100ML IV SOLN
0.1000 ug/kg/min | INTRAVENOUS | Status: DC
  Administered 2015-02-14: 0.3 ug/kg/min via INTRAVENOUS
  Administered 2015-02-15 – 2015-02-16 (×2): 0.2 ug/kg/min via INTRAVENOUS
  Filled 2015-02-14 (×3): qty 100

## 2015-02-14 MED ORDER — SODIUM CHLORIDE 0.45 % IV SOLN
INTRAVENOUS | Status: DC | PRN
Start: 2015-02-14 — End: 2015-02-16
  Administered 2015-02-14: 18:00:00 via INTRAVENOUS

## 2015-02-14 MED ORDER — SODIUM CHLORIDE 0.9 % IV SOLN: 250.0000 mL | INTRAVENOUS | Status: DC

## 2015-02-14 MED ORDER — ASPIRIN 81 MG PO CHEW: 324.0000 mg | CHEWABLE_TABLET | Freq: Every day | ORAL | Status: DC

## 2015-02-14 MED ORDER — LACTATED RINGERS IV SOLN: INTRAVENOUS | Status: DC

## 2015-02-14 MED ORDER — ALBUMIN HUMAN 5 % IV SOLN
250.0000 mL | INTRAVENOUS | Status: AC | PRN
Start: 2015-02-14 — End: 2015-02-15
  Administered 2015-02-14 (×4): 250 mL via INTRAVENOUS

## 2015-02-14 MED ORDER — SODIUM CHLORIDE 0.9 % IJ SOLN
3.0000 mL | Freq: Two times a day (BID) | INTRAMUSCULAR | Status: DC
  Administered 2015-02-15 – 2015-02-17 (×5): 3 mL via INTRAVENOUS
  Administered 2015-02-17: 10 mL via INTRAVENOUS
  Administered 2015-02-20: 3 mL via INTRAVENOUS

## 2015-02-14 MED ORDER — ONDANSETRON HCL 4 MG/2ML IJ SOLN: 4.0000 mg | Freq: Four times a day (QID) | INTRAMUSCULAR | Status: DC | PRN

## 2015-02-14 MED ORDER — MIDAZOLAM HCL 10 MG/2ML IJ SOLN
INTRAMUSCULAR | Status: AC
  Filled 2015-02-14: qty 2

## 2015-02-14 MED ORDER — VECURONIUM BROMIDE 10 MG IV SOLR
INTRAVENOUS | Status: AC
  Filled 2015-02-14: qty 10

## 2015-02-14 MED ORDER — ARTIFICIAL TEARS OP OINT
TOPICAL_OINTMENT | OPHTHALMIC | Status: DC | PRN
  Administered 2015-02-14: 1 via OPHTHALMIC

## 2015-02-14 MED ORDER — FENTANYL CITRATE (PF) 250 MCG/5ML IJ SOLN
INTRAMUSCULAR | Status: AC
  Filled 2015-02-14: qty 5

## 2015-02-14 MED ORDER — MILRINONE IN DEXTROSE 20 MG/100ML IV SOLN
0.1250 ug/kg/min | INTRAVENOUS | Status: AC
  Administered 2015-02-14: .2 ug/kg/min via INTRAVENOUS
  Filled 2015-02-14: qty 100

## 2015-02-14 MED ORDER — BISACODYL 5 MG PO TBEC
10.0000 mg | DELAYED_RELEASE_TABLET | Freq: Every day | ORAL | Status: DC
  Administered 2015-02-15: 10 mg via ORAL
  Filled 2015-02-14: qty 2

## 2015-02-14 MED ORDER — ACETAMINOPHEN 650 MG RE SUPP
650.0000 mg | Freq: Once | RECTAL | Status: AC
  Administered 2015-02-14: 650 mg via RECTAL

## 2015-02-14 MED ORDER — LACTATED RINGERS IV SOLN
INTRAVENOUS | Status: DC | PRN
  Administered 2015-02-14 (×5): via INTRAVENOUS

## 2015-02-14 MED ORDER — CALCIUM CHLORIDE 10 % IV SOLN
1.0000 g | Freq: Once | INTRAVENOUS | Status: AC
  Administered 2015-02-14: 1 g via INTRAVENOUS

## 2015-02-14 MED ORDER — DEXMEDETOMIDINE HCL IN NACL 200 MCG/50ML IV SOLN
0.0000 ug/kg/h | INTRAVENOUS | Status: DC
  Filled 2015-02-14: qty 50

## 2015-02-14 MED ORDER — MIDAZOLAM HCL 5 MG/5ML IJ SOLN
INTRAMUSCULAR | Status: DC | PRN
  Administered 2015-02-14: 3 mg via INTRAVENOUS
  Administered 2015-02-14: 2 mg via INTRAVENOUS
  Administered 2015-02-14: 5 mg via INTRAVENOUS

## 2015-02-14 MED ORDER — ASPIRIN EC 325 MG PO TBEC
325.0000 mg | DELAYED_RELEASE_TABLET | Freq: Every day | ORAL | Status: DC
  Administered 2015-02-15 – 2015-02-18 (×4): 325 mg via ORAL
  Filled 2015-02-14 (×4): qty 1

## 2015-02-14 MED ORDER — DEXTROSE 50 % IV SOLN
INTRAVENOUS | Status: AC
Start: 2015-02-14 — End: 2015-02-14
  Administered 2015-02-14: 13 mL
  Filled 2015-02-14: qty 50

## 2015-02-14 MED ORDER — SODIUM CHLORIDE 0.9 % IV SOLN: Freq: Once | INTRAVENOUS | Status: DC

## 2015-02-14 MED ORDER — BISACODYL 10 MG RE SUPP: 10.0000 mg | Freq: Every day | RECTAL | Status: DC

## 2015-02-14 MED ORDER — LACTATED RINGERS IV SOLN: 500.0000 mL | Freq: Once | INTRAVENOUS | Status: DC | PRN

## 2015-02-14 MED ORDER — NITROGLYCERIN IN D5W 200-5 MCG/ML-% IV SOLN
0.0000 ug/min | INTRAVENOUS | Status: DC
Start: 2015-02-14 — End: 2015-02-15

## 2015-02-14 MED ORDER — 0.9 % SODIUM CHLORIDE (POUR BTL) OPTIME
TOPICAL | Status: DC | PRN
  Administered 2015-02-14: 6000 mL

## 2015-02-14 MED ORDER — FAMOTIDINE IN NACL 20-0.9 MG/50ML-% IV SOLN
20.0000 mg | Freq: Two times a day (BID) | INTRAVENOUS | Status: AC
  Administered 2015-02-14: 20 mg via INTRAVENOUS

## 2015-02-14 MED ORDER — TRAMADOL HCL 50 MG PO TABS: 50.0000 mg | ORAL_TABLET | ORAL | Status: DC | PRN

## 2015-02-14 MED ORDER — SODIUM CHLORIDE 0.9 % IJ SOLN: 3.0000 mL | INTRAMUSCULAR | Status: DC | PRN

## 2015-02-14 MED ORDER — SODIUM CHLORIDE 0.9 % IJ SOLN
INTRAMUSCULAR | Status: DC | PRN
  Administered 2015-02-14 (×3): 4 mL via TOPICAL

## 2015-02-14 MED ORDER — ACETAMINOPHEN 500 MG PO TABS
1000.0000 mg | ORAL_TABLET | Freq: Four times a day (QID) | ORAL | Status: AC
  Administered 2015-02-15 – 2015-02-19 (×17): 1000 mg via ORAL
  Filled 2015-02-14 (×19): qty 2

## 2015-02-14 MED ORDER — VECURONIUM BROMIDE 10 MG IV SOLR
INTRAVENOUS | Status: DC | PRN
  Administered 2015-02-14 (×4): 5 mg via INTRAVENOUS

## 2015-02-14 MED ORDER — ROCURONIUM BROMIDE 100 MG/10ML IV SOLN
INTRAVENOUS | Status: DC | PRN
  Administered 2015-02-14: 50 mg via INTRAVENOUS

## 2015-02-14 MED ORDER — MORPHINE SULFATE 2 MG/ML IJ SOLN: 1.0000 mg | INTRAMUSCULAR | Status: AC | PRN

## 2015-02-14 MED ORDER — HEPARIN SODIUM (PORCINE) 1000 UNIT/ML IJ SOLN
INTRAMUSCULAR | Status: DC | PRN
  Administered 2015-02-14: 5000 [IU] via INTRAVENOUS
  Administered 2015-02-14: 2000 [IU] via INTRAVENOUS
  Administered 2015-02-14: 21000 [IU] via INTRAVENOUS
  Administered 2015-02-14: 2000 [IU] via INTRAVENOUS

## 2015-02-14 MED ORDER — CHLORHEXIDINE GLUCONATE 0.12 % MT SOLN
15.0000 mL | Freq: Two times a day (BID) | OROMUCOSAL | Status: DC
  Administered 2015-02-14 – 2015-02-16 (×3): 15 mL via OROMUCOSAL
  Filled 2015-02-14 (×3): qty 15

## 2015-02-14 MED ORDER — HEMOSTATIC AGENTS (NO CHARGE) OPTIME
TOPICAL | Status: DC | PRN
  Administered 2015-02-14: 1 via TOPICAL

## 2015-02-14 MED ORDER — METOPROLOL TARTRATE 25 MG/10 ML ORAL SUSPENSION
12.5000 mg | Freq: Two times a day (BID) | ORAL | Status: DC
  Filled 2015-02-14 (×15): qty 5

## 2015-02-14 MED ORDER — VANCOMYCIN HCL IN DEXTROSE 1-5 GM/200ML-% IV SOLN
1000.0000 mg | Freq: Once | INTRAVENOUS | Status: DC
  Filled 2015-02-14: qty 200

## 2015-02-14 MED ORDER — VANCOMYCIN HCL IN DEXTROSE 1-5 GM/200ML-% IV SOLN
1000.0000 mg | Freq: Two times a day (BID) | INTRAVENOUS | Status: AC
  Administered 2015-02-14 – 2015-02-15 (×2): 1000 mg via INTRAVENOUS
  Filled 2015-02-14 (×2): qty 200

## 2015-02-14 MED ORDER — SODIUM CHLORIDE 0.9 % IV SOLN
INTRAVENOUS | Status: DC
  Filled 2015-02-14 (×2): qty 2.5

## 2015-02-14 MED ORDER — METOPROLOL TARTRATE 1 MG/ML IV SOLN: 2.5000 mg | INTRAVENOUS | Status: DC | PRN

## 2015-02-14 MED ORDER — ROCURONIUM BROMIDE 50 MG/5ML IV SOLN
INTRAVENOUS | Status: AC
  Filled 2015-02-14: qty 1

## 2015-02-14 MED ORDER — ACETAMINOPHEN 160 MG/5ML PO SOLN
1000.0000 mg | Freq: Four times a day (QID) | ORAL | Status: AC
  Filled 2015-02-14: qty 40

## 2015-02-14 MED ORDER — PROTAMINE SULFATE 10 MG/ML IV SOLN
INTRAVENOUS | Status: DC | PRN
  Administered 2015-02-14: 250 mg via INTRAVENOUS

## 2015-02-14 MED ORDER — SODIUM CHLORIDE 0.9 % IV SOLN: INTRAVENOUS | Status: DC

## 2015-02-14 MED ORDER — ALBUMIN HUMAN 5 % IV SOLN: 250.0000 mL | INTRAVENOUS | Status: DC | PRN

## 2015-02-14 MED ORDER — PROPOFOL 10 MG/ML IV BOLUS
INTRAVENOUS | Status: AC
  Filled 2015-02-14: qty 20

## 2015-02-14 MED ORDER — HEPARIN SODIUM (PORCINE) 1000 UNIT/ML IJ SOLN
INTRAMUSCULAR | Status: AC
  Filled 2015-02-14: qty 1

## 2015-02-14 MED ORDER — PHENYLEPHRINE HCL 10 MG/ML IJ SOLN
0.0000 ug/min | INTRAMUSCULAR | Status: DC
  Filled 2015-02-14: qty 2

## 2015-02-14 MED ORDER — MAGNESIUM SULFATE 4 GM/100ML IV SOLN
4.0000 g | Freq: Once | INTRAVENOUS | Status: AC
  Administered 2015-02-14: 4 g via INTRAVENOUS
  Filled 2015-02-14: qty 100

## 2015-02-14 MED ORDER — ARTIFICIAL TEARS OP OINT
TOPICAL_OINTMENT | OPHTHALMIC | Status: AC
  Filled 2015-02-14: qty 3.5

## 2015-02-14 MED ORDER — PANTOPRAZOLE SODIUM 40 MG PO TBEC
40.0000 mg | DELAYED_RELEASE_TABLET | Freq: Every day | ORAL | Status: DC
  Administered 2015-02-16 – 2015-02-20 (×5): 40 mg via ORAL
  Filled 2015-02-14 (×5): qty 1

## 2015-02-14 MED ORDER — PHENYLEPHRINE HCL 10 MG/ML IJ SOLN
INTRAMUSCULAR | Status: DC | PRN
  Administered 2015-02-14: 80 ug via INTRAVENOUS

## 2015-02-14 MED ORDER — DOCUSATE SODIUM 100 MG PO CAPS
200.0000 mg | ORAL_CAPSULE | Freq: Every day | ORAL | Status: DC
  Administered 2015-02-15 – 2015-02-17 (×3): 200 mg via ORAL
  Filled 2015-02-14 (×6): qty 2

## 2015-02-14 MED ORDER — MIDAZOLAM HCL 2 MG/2ML IJ SOLN: 2.0000 mg | INTRAMUSCULAR | Status: DC | PRN

## 2015-02-14 SURGICAL SUPPLY — 100 items
ADAPTER CARDIO PERF ANTE/RETRO (ADAPTER) ×4 IMPLANT
APPLICATOR COTTON TIP 6IN STRL (MISCELLANEOUS) ×4 IMPLANT
BAG DECANTER FOR FLEXI CONT (MISCELLANEOUS) ×4 IMPLANT
BANDAGE ELASTIC 4 VELCRO ST LF (GAUZE/BANDAGES/DRESSINGS) ×8 IMPLANT
BANDAGE ELASTIC 6 VELCRO ST LF (GAUZE/BANDAGES/DRESSINGS) ×8 IMPLANT
BASKET HEART  (ORDER IN 25'S) (MISCELLANEOUS) ×1
BASKET HEART (ORDER IN 25'S) (MISCELLANEOUS) ×1
BASKET HEART (ORDER IN 25S) (MISCELLANEOUS) ×2 IMPLANT
BLADE STERNUM SYSTEM 6 (BLADE) ×4 IMPLANT
BLADE SURG 11 STRL SS (BLADE) ×4 IMPLANT
BLADE SURG 12 STRL SS (BLADE) ×4 IMPLANT
BLADE SURG ROTATE 9660 (MISCELLANEOUS) IMPLANT
BNDG GAUZE ELAST 4 BULKY (GAUZE/BANDAGES/DRESSINGS) ×8 IMPLANT
CANISTER SUCTION 2500CC (MISCELLANEOUS) ×4 IMPLANT
CANNULA GUNDRY RCSP 15FR (MISCELLANEOUS) ×4 IMPLANT
CANNULA VESSEL 3MM 2 BLNT TIP (CANNULA) ×4 IMPLANT
CATH CPB KIT VANTRIGT (MISCELLANEOUS) ×4 IMPLANT
CATH ROBINSON RED A/P 18FR (CATHETERS) ×12 IMPLANT
CATH THORACIC 36FR RT ANG (CATHETERS) ×8 IMPLANT
CLIP FOGARTY SPRING 6M (CLIP) ×4 IMPLANT
CLIP TI WIDE RED SMALL 24 (CLIP) ×4 IMPLANT
COVER SURGICAL LIGHT HANDLE (MISCELLANEOUS) ×4 IMPLANT
CRADLE DONUT ADULT HEAD (MISCELLANEOUS) ×4 IMPLANT
DRAIN CHANNEL 32F RND 10.7 FF (WOUND CARE) ×4 IMPLANT
DRAPE CARDIOVASCULAR INCISE (DRAPES) ×2
DRAPE SLUSH/WARMER DISC (DRAPES) ×4 IMPLANT
DRAPE SRG 135X102X78XABS (DRAPES) ×2 IMPLANT
DRSG AQUACEL AG ADV 3.5X14 (GAUZE/BANDAGES/DRESSINGS) ×4 IMPLANT
ELECT BLADE 4.0 EZ CLEAN MEGAD (MISCELLANEOUS) ×4
ELECT BLADE 6.5 EXT (BLADE) ×4 IMPLANT
ELECT CAUTERY BLADE 6.4 (BLADE) ×4 IMPLANT
ELECT REM PT RETURN 9FT ADLT (ELECTROSURGICAL) ×8
ELECTRODE BLDE 4.0 EZ CLN MEGD (MISCELLANEOUS) ×2 IMPLANT
ELECTRODE REM PT RTRN 9FT ADLT (ELECTROSURGICAL) ×4 IMPLANT
GAUZE SPONGE 4X4 12PLY STRL (GAUZE/BANDAGES/DRESSINGS) ×8 IMPLANT
GLOVE BIO SURGEON STRL SZ 6 (GLOVE) ×8 IMPLANT
GLOVE BIO SURGEON STRL SZ 6.5 (GLOVE) ×15 IMPLANT
GLOVE BIO SURGEON STRL SZ7.5 (GLOVE) ×12 IMPLANT
GLOVE BIO SURGEONS STRL SZ 6.5 (GLOVE) ×5
GLOVE BIOGEL PI IND STRL 7.0 (GLOVE) ×10 IMPLANT
GLOVE BIOGEL PI INDICATOR 7.0 (GLOVE) ×10
GOWN STRL REUS W/ TWL LRG LVL3 (GOWN DISPOSABLE) ×8 IMPLANT
GOWN STRL REUS W/TWL LRG LVL3 (GOWN DISPOSABLE) ×8
HEMOSTAT POWDER SURGIFOAM 1G (HEMOSTASIS) ×12 IMPLANT
HEMOSTAT SURGICEL 2X14 (HEMOSTASIS) ×4 IMPLANT
INSERT FOGARTY XLG (MISCELLANEOUS) IMPLANT
KIT BASIN OR (CUSTOM PROCEDURE TRAY) ×4 IMPLANT
KIT ROOM TURNOVER OR (KITS) ×4 IMPLANT
KIT SUCTION CATH 14FR (SUCTIONS) ×4 IMPLANT
KIT VASOVIEW W/TROCAR VH 2000 (KITS) ×4 IMPLANT
LEAD PACING MYOCARDI (MISCELLANEOUS) ×4 IMPLANT
MARKER GRAFT CORONARY BYPASS (MISCELLANEOUS) ×12 IMPLANT
NS IRRIG 1000ML POUR BTL (IV SOLUTION) ×24 IMPLANT
PACK OPEN HEART (CUSTOM PROCEDURE TRAY) ×4 IMPLANT
PAD ARMBOARD 7.5X6 YLW CONV (MISCELLANEOUS) ×8 IMPLANT
PAD ELECT DEFIB RADIOL ZOLL (MISCELLANEOUS) ×4 IMPLANT
PENCIL BUTTON HOLSTER BLD 10FT (ELECTRODE) ×4 IMPLANT
PUNCH AORTIC ROT 4.0MM RCL 40 (MISCELLANEOUS) ×4 IMPLANT
PUNCH AORTIC ROTATE 4.0MM (MISCELLANEOUS) IMPLANT
PUNCH AORTIC ROTATE 4.5MM 8IN (MISCELLANEOUS) IMPLANT
PUNCH AORTIC ROTATE 5MM 8IN (MISCELLANEOUS) IMPLANT
SOLUTION ANTI FOG 6CC (MISCELLANEOUS) ×4 IMPLANT
SPONGE GAUZE 4X4 12PLY STER LF (GAUZE/BANDAGES/DRESSINGS) ×8 IMPLANT
SURGIFLO W/THROMBIN 8M KIT (HEMOSTASIS) IMPLANT
SUT BONE WAX W31G (SUTURE) ×4 IMPLANT
SUT ETHILON 3 0 FSL (SUTURE) ×4 IMPLANT
SUT MNCRL AB 4-0 PS2 18 (SUTURE) ×8 IMPLANT
SUT PROLENE 3 0 SH DA (SUTURE) IMPLANT
SUT PROLENE 3 0 SH1 36 (SUTURE) ×4 IMPLANT
SUT PROLENE 4 0 RB 1 (SUTURE) ×2
SUT PROLENE 4 0 SH DA (SUTURE) ×12 IMPLANT
SUT PROLENE 4-0 RB1 .5 CRCL 36 (SUTURE) ×2 IMPLANT
SUT PROLENE 5 0 C 1 36 (SUTURE) IMPLANT
SUT PROLENE 6 0 C 1 30 (SUTURE) ×8 IMPLANT
SUT PROLENE 6 0 CC (SUTURE) ×20 IMPLANT
SUT PROLENE 8 0 BV175 6 (SUTURE) IMPLANT
SUT PROLENE BLUE 7 0 (SUTURE) ×4 IMPLANT
SUT SILK  1 MH (SUTURE)
SUT SILK 1 MH (SUTURE) IMPLANT
SUT SILK 2 0 SH CR/8 (SUTURE) IMPLANT
SUT SILK 3 0 SH CR/8 (SUTURE) IMPLANT
SUT STEEL 6MS V (SUTURE) ×8 IMPLANT
SUT STEEL SZ 6 DBL 3X14 BALL (SUTURE) ×8 IMPLANT
SUT VIC AB 1 CTX 27 (SUTURE) ×4 IMPLANT
SUT VIC AB 1 CTX 36 (SUTURE) ×4
SUT VIC AB 1 CTX36XBRD ANBCTR (SUTURE) ×4 IMPLANT
SUT VIC AB 2-0 CT1 27 (SUTURE) ×4
SUT VIC AB 2-0 CT1 TAPERPNT 27 (SUTURE) ×4 IMPLANT
SUT VIC AB 2-0 CTX 27 (SUTURE) IMPLANT
SUT VIC AB 3-0 X1 27 (SUTURE) IMPLANT
SUTURE E-PAK OPEN HEART (SUTURE) ×4 IMPLANT
SYSTEM SAHARA CHEST DRAIN ATS (WOUND CARE) ×4 IMPLANT
TAPE CLOTH SURG 4X10 WHT LF (GAUZE/BANDAGES/DRESSINGS) ×4 IMPLANT
TAPE PAPER 2X10 WHT MICROPORE (GAUZE/BANDAGES/DRESSINGS) ×4 IMPLANT
TOWEL OR 17X24 6PK STRL BLUE (TOWEL DISPOSABLE) ×8 IMPLANT
TOWEL OR 17X26 10 PK STRL BLUE (TOWEL DISPOSABLE) ×8 IMPLANT
TRAY FOLEY IC TEMP SENS 16FR (CATHETERS) ×4 IMPLANT
TUBING INSUFFLATION (TUBING) ×4 IMPLANT
UNDERPAD 30X30 INCONTINENT (UNDERPADS AND DIAPERS) ×4 IMPLANT
WATER STERILE IRR 1000ML POUR (IV SOLUTION) ×8 IMPLANT

## 2015-02-14 NOTE — Transfer of Care (Signed)
Immediate Anesthesia Transfer of Care Note  Patient: [name] [name]  Procedure(s) Performed: Procedure(s): CORONARY ARTERY BYPASS GRAFTING (CABG) x three, using left internal mammary artery and left leg greater saphenous vein harvested endoscopically (N/A) TRANSESOPHAGEAL ECHOCARDIOGRAM (TEE) (N/A)  Patient Location: SICU  Anesthesia Type:General  Level of Consciousness: sedated and Patient remains intubated per anesthesia plan  Airway & Oxygen Therapy: Patient remains intubated per anesthesia plan and Patient placed on Ventilator (see vital sign flow sheet for setting)  Post-op Assessment: Report given to RN and Post -op Vital signs reviewed and stable  Post vital signs: Reviewed and stable  Last Vitals:  Filed Vitals:   02/14/15 1330  BP: 154/71  Pulse: 91  Temp:   Resp: 18    Complications: No apparent anesthesia complications

## 2015-02-14 NOTE — Anesthesia Procedure Notes (Addendum)
Procedure Name: Intubation Date/Time: 02/14/2015 8:08 AM Performed by: Roney Mans P Pre-anesthesia Checklist: Patient identified, Timeout performed, Emergency Drugs available, Suction available and Patient being monitored Patient Re-evaluated:Patient Re-evaluated prior to inductionOxygen Delivery Method: Circle system utilized Preoxygenation: Pre-oxygenation with 100% oxygen Intubation Type: IV induction Ventilation: Mask ventilation without difficulty Laryngoscope Size: Mac and 4 Grade View: Grade I Tube type: Subglottic suction tube Tube size: 8.0 mm Number of attempts: 1 Airway Equipment and Method: Stylet Placement Confirmation: ETT inserted through vocal cords under direct vision,  breath sounds checked- equal and bilateral and positive ETCO2 Secured at: 22 cm Tube secured with: Tape Dental Injury: Teeth and Oropharynx as per pre-operative assessment

## 2015-02-14 NOTE — Procedures (Addendum)
Extubation Procedure Note  Patient Details:   Name: [name] [name] DOB: May 27, 1941 MRN: [id number]   Airway Documentation:  Airway 8 mm (Active)  Secured at (cm) 22 cm 02/14/2015  4:01 PM  Measured From Lips 02/14/2015  4:01 PM  Secured Location Right 02/14/2015  4:01 PM  Secured By Pink Tape 02/14/2015  4:01 PM  Site Condition Dry 02/14/2015  4:01 PM    Evaluation  O2 sats: stable throughout Complications: No apparent complications Patient did tolerate procedure well. Bilateral Breath Sounds: Clear, Diminished Suctioning: Oral Yes   Patient extubated to 4lnc. Patient tolerated well. No complications. RN at bedside. RT will continue to monitor.  [id number]  [name] [name] 02/14/2015, 6:53 PM

## 2015-02-14 NOTE — OR Nursing (Signed)
12:10 - 1st call to SICU, 12:50 - 2nd call to SICU

## 2015-02-14 NOTE — Brief Op Note (Signed)
01/13/2015 - 02/14/2015  11:33 AM  PATIENT:  [name] [name]  74 y.o. male  PRE-OPERATIVE DIAGNOSIS:  CAD  POST-OPERATIVE DIAGNOSIS:  CAD  PROCEDURE:  Procedure(s):  CORONARY ARTERY BYPASS GRAFTING x 3 -LIMA to LAD -SVG to DIAGONAL -SVG to OM  ENDOSCOPIC HARVEST GREATER SAPHENOUS VEIN -Right Thigh- poor quality not suitable for conduit -Left Thigh  TRANSESOPHAGEAL ECHOCARDIOGRAM (TEE) (N/A)  SURGEON:  Surgeon(s) and Role:    * Kerin Perna, MD - Primary  PHYSICIAN ASSISTANT: Lowella Dandy PA-C  ANESTHESIA:   general  EBL:  Total I/O In: 1000 [I.V.:1000] Out: -   BLOOD ADMINISTERED:3U PRBC,  CELLSAVER, 2 FFP and 1 PLTS  DRAINS: Mediastinal Chest Drains, Left Pleural chest tube   LOCAL MEDICATIONS USED:  NONE  SPECIMEN:  No Specimen  DISPOSITION OF SPECIMEN:  N/A  COUNTS:  YES  TOURNIQUET:  * No tourniquets in log *  DICTATION: .Dragon Dictation  PLAN OF CARE: Admit to inpatient   PATIENT DISPOSITION:  ICU - intubated and hemodynamically stable.   Delay start of Pharmacological VTE agent (>24hrs) due to surgical blood loss or risk of bleeding: yes

## 2015-02-14 NOTE — Progress Notes (Signed)
  Echocardiogram Echocardiogram Transesophageal has been performed.  [name] [name] 02/14/2015, 8:44 AM

## 2015-02-14 NOTE — Care Management Note (Signed)
Case Management Note  Patient Details  Name: [name] [name] MRN: [id number] Date of Birth: 1941-03-06  Subjective/Objective:  Pt intially admitted for gallstone pancreatitis.       Action/Plan:  Pt has CABG scheduled for 02/14/15.  CM will continue to monitor for disposition needs  Expected Discharge Date:                  Expected Discharge Plan:  Will defer until post CABG  In-House Referral:  CM  Discharge planning Services     Post Acute Care Choice:    Choice offered to:     DME Arranged:    DME Agency:     HH Arranged:    HH Agency:     Status of Service:     Medicare Important Message Given:  Yes Date Medicare IM Given:  02/07/15 Medicare IM give by:  Raynald Blend Date Additional Medicare IM Given:  02/13/15 Additional Medicare Important Message give by:  Raynald Blend  If discussed at Long Length of Stay Meetings, dates discussed:  02/14/15  Additional Comments:  Cherylann Parr, RN 02/14/2015, 9:42 PM

## 2015-02-14 NOTE — Anesthesia Postprocedure Evaluation (Signed)
  Anesthesia Post-op Note  Patient: [name] [name]  Procedure(s) Performed: Procedure(s): CORONARY ARTERY BYPASS GRAFTING (CABG) x three, using left internal mammary artery and left leg greater saphenous vein harvested endoscopically (N/A) TRANSESOPHAGEAL ECHOCARDIOGRAM (TEE) (N/A)  Patient Location: ICU  Anesthesia Type:General  Level of Consciousness: sedated  Airway and Oxygen Therapy: Patient remains intubated per anesthesia plan  Post-op Pain: none  Post-op Assessment: Post-op Vital signs reviewed, Patient's Cardiovascular Status Stable, Respiratory Function Stable, Patent Airway and Pain level controlled LLE Motor Response: Purposeful movement, Responds to commands   RLE Motor Response: Purposeful movement, Responds to commands        Post-op Vital Signs: Reviewed and stable  Last Vitals:  Filed Vitals:   02/14/15 1330  BP: 154/71  Pulse: 91  Temp:   Resp: 18    Complications: No apparent anesthesia complications

## 2015-02-14 NOTE — Progress Notes (Signed)
Patient ID: [name] [name], male   DOB: 12-Jul-1941, 74 y.o.   MRN: [id number]  SICU Evening Rounds:   Hemodynamically stable  CI = 2.4 on epi 0.5 and milrinone 0.3  Still asleep on vent.    Urine output good  CT output 400 since surgery but low now.  CBC    Component Value Date/Time   WBC 13.2* 02/14/2015 1342   RBC 3.52* 02/14/2015 1342   RBC 3.24* 02/05/2015 2357   HGB 10.5* 02/14/2015 1344   HCT 31.0* 02/14/2015 1344   PLT 130* 02/14/2015 1342   MCV 85.5 02/14/2015 1342   MCH 28.1 02/14/2015 1342   MCHC 32.9 02/14/2015 1342   RDW 14.3 02/14/2015 1342   LYMPHSABS 1.2 01/28/2015 0320   MONOABS 0.6 01/28/2015 0320   EOSABS 0.2 01/28/2015 0320   BASOSABS 0.0 01/28/2015 0320     BMET    Component Value Date/Time   NA 139 02/14/2015 1344   K 3.6 02/14/2015 1344   CL 100* 02/14/2015 1224   CO2 26 02/14/2015 0450   GLUCOSE 133* 02/14/2015 1344   BUN 15 02/14/2015 1224   CREATININE 0.90 02/14/2015 1224   CALCIUM 7.6* 02/14/2015 0450   GFRNONAA 52* 02/14/2015 0450   GFRAA >60 02/14/2015 0450     A/P:  Stable postop course. Continue current plans

## 2015-02-14 NOTE — Anesthesia Preprocedure Evaluation (Addendum)
Anesthesia Evaluation  Patient identified by MRN, date of birth, ID band Patient awake    Reviewed: Allergy & Precautions, NPO status , Patient's Chart, lab work & pertinent test results, reviewed documented beta blocker date and time   History of Anesthesia Complications Negative for: history of anesthetic complications  Airway Mallampati: II  TM Distance: >3 FB Neck ROM: Full    Dental  (+) Teeth Intact, Dental Advisory Given   Pulmonary    Pulmonary exam normal       Cardiovascular hypertension, + CAD, + Past MI and +CHF Normal cardiovascular examRhythm:Regular Rate:Normal  EF 30% to 40%. Valves OK (mild MR). Multiple regions with hypokinesis.    Neuro/Psych negative neurological ROS     GI/Hepatic PUD,   Endo/Other  diabetes, Well Controlled, Type 2, Insulin Dependent, Oral Hypoglycemic Agents  Renal/GU ARFRenal diseaseCr 1.3     Musculoskeletal   Abdominal   Peds  Hematology  (+) anemia ,   Anesthesia Other Findings   Reproductive/Obstetrics                           Anesthesia Physical Anesthesia Plan  ASA: IV  Anesthesia Plan: General   Post-op Pain Management:    Induction: Intravenous  Airway Management Planned: Oral ETT  Additional Equipment: Arterial line, CVP, PA Cath, TEE and Ultrasound Guidance Line Placement  Intra-op Plan: Utilization Of Total Body Hypothermia per surgeon request  Post-operative Plan: Post-operative intubation/ventilation  Informed Consent: I have reviewed the patients History and Physical, chart, labs and discussed the procedure including the risks, benefits and alternatives for the proposed anesthesia with the patient or authorized representative who has indicated his/her understanding and acceptance.   Dental advisory given  Plan Discussed with: CRNA, Anesthesiologist and Surgeon  Anesthesia Plan Comments:         Anesthesia Quick  Evaluation

## 2015-02-14 NOTE — Progress Notes (Signed)
The patient was examined and preop studies reviewed. There has been no change from the prior exam and the patient is ready for surgery. Plan CaBG on R Widmer

## 2015-02-14 NOTE — OR Nursing (Signed)
0854 chest incision made

## 2015-02-15 ENCOUNTER — Inpatient Hospital Stay (HOSPITAL_COMMUNITY): Payer: Medicare Other

## 2015-02-15 ENCOUNTER — Encounter (HOSPITAL_COMMUNITY): Payer: Self-pay | Admitting: Cardiothoracic Surgery

## 2015-02-15 LAB — GLUCOSE, CAPILLARY
GLUCOSE-CAPILLARY: 93 mg/dL (ref 65–99)
GLUCOSE-CAPILLARY: 146 mg/dL — AB (ref 65–99)
GLUCOSE-CAPILLARY: 138 mg/dL — AB (ref 65–99)
GLUCOSE-CAPILLARY: 134 mg/dL — AB (ref 65–99)
GLUCOSE-CAPILLARY: 116 mg/dL — AB (ref 65–99)
GLUCOSE-CAPILLARY: 184 mg/dL — AB (ref 65–99)
Glucose-Capillary: 109 mg/dL — ABNORMAL HIGH (ref 65–99)
Glucose-Capillary: 155 mg/dL — ABNORMAL HIGH (ref 65–99)
Glucose-Capillary: 157 mg/dL — ABNORMAL HIGH (ref 65–99)
Glucose-Capillary: 82 mg/dL (ref 65–99)
Glucose-Capillary: 85 mg/dL (ref 65–99)
Glucose-Capillary: 88 mg/dL (ref 65–99)
Glucose-Capillary: 89 mg/dL (ref 65–99)
Glucose-Capillary: 98 mg/dL (ref 65–99)

## 2015-02-15 LAB — CBC
HCT: 28.4 % — ABNORMAL LOW (ref 39.0–52.0)
HCT: 27.9 % — ABNORMAL LOW (ref 39.0–52.0)
Hemoglobin: 9.2 g/dL — ABNORMAL LOW (ref 13.0–17.0)
Hemoglobin: 9.1 g/dL — ABNORMAL LOW (ref 13.0–17.0)
MCH: 27.6 pg (ref 26.0–34.0)
MCH: 28.1 pg (ref 26.0–34.0)
MCHC: 32.4 g/dL (ref 30.0–36.0)
MCHC: 32.6 g/dL (ref 30.0–36.0)
MCV: 85.3 fL (ref 78.0–100.0)
MCV: 86.1 fL (ref 78.0–100.0)
Platelets: 167 10*3/uL (ref 150–400)
Platelets: 133 10*3/uL — ABNORMAL LOW (ref 150–400)
RBC: 3.33 MIL/uL — ABNORMAL LOW (ref 4.22–5.81)
RBC: 3.24 MIL/uL — AB (ref 4.22–5.81)
RDW: 14.4 % (ref 11.5–15.5)
RDW: 14.7 % (ref 11.5–15.5)
WBC: 13.5 10*3/uL — AB (ref 4.0–10.5)
WBC: 11.3 10*3/uL — ABNORMAL HIGH (ref 4.0–10.5)

## 2015-02-15 LAB — POCT I-STAT, CHEM 8
BUN: 18 mg/dL (ref 6–20)
Calcium, Ion: 1.12 mmol/L — ABNORMAL LOW (ref 1.13–1.30)
Chloride: 101 mmol/L (ref 101–111)
Creatinine, Ser: 1.5 mg/dL — ABNORMAL HIGH (ref 0.61–1.24)
Glucose, Bld: 151 mg/dL — ABNORMAL HIGH (ref 65–99)
HCT: 28 % — ABNORMAL LOW (ref 39.0–52.0)
Hemoglobin: 9.5 g/dL — ABNORMAL LOW (ref 13.0–17.0)
Potassium: 4.5 mmol/L (ref 3.5–5.1)
SODIUM: 136 mmol/L (ref 135–145)
TCO2: 22 mmol/L (ref 0–100)

## 2015-02-15 LAB — MAGNESIUM
Magnesium: 2.1 mg/dL (ref 1.7–2.4)
Magnesium: 1.8 mg/dL (ref 1.7–2.4)

## 2015-02-15 LAB — PREPARE FRESH FROZEN PLASMA
Unit division: 0
Unit division: 0

## 2015-02-15 LAB — PREPARE PLATELET PHERESIS: Unit division: 0

## 2015-02-15 LAB — BASIC METABOLIC PANEL
Anion gap: 6 (ref 5–15)
BUN: 15 mg/dL (ref 6–20)
CALCIUM: 7.5 mg/dL — AB (ref 8.9–10.3)
CO2: 26 mmol/L (ref 22–32)
CREATININE: 1.36 mg/dL — AB (ref 0.61–1.24)
Chloride: 105 mmol/L (ref 101–111)
GFR calc Af Amer: 58 mL/min — ABNORMAL LOW (ref >60)
GFR calc non Af Amer: 50 mL/min — ABNORMAL LOW (ref >60)
GLUCOSE: 90 mg/dL (ref 65–99)
POTASSIUM: 4.5 mmol/L (ref 3.5–5.1)
Sodium: 137 mmol/L (ref 135–145)

## 2015-02-15 LAB — CREATININE, SERUM
Creatinine, Ser: 1.54 mg/dL — ABNORMAL HIGH (ref 0.61–1.24)
GFR, EST AFRICAN AMERICAN: 50 mL/min — AB (ref >60)
GFR, EST NON AFRICAN AMERICAN: 43 mL/min — AB (ref >60)

## 2015-02-15 LAB — AMYLASE: AMYLASE: 54 U/L (ref 28–100)

## 2015-02-15 MED ORDER — MIDAZOLAM HCL 2 MG/2ML IJ SOLN: 2.0000 mg | INTRAMUSCULAR | Status: DC | PRN

## 2015-02-15 MED ORDER — FUROSEMIDE 10 MG/ML IJ SOLN
20.0000 mg | Freq: Two times a day (BID) | INTRAMUSCULAR | Status: DC
  Administered 2015-02-15 – 2015-02-17 (×6): 20 mg via INTRAVENOUS
  Filled 2015-02-15 (×8): qty 2

## 2015-02-15 MED ORDER — ENOXAPARIN SODIUM 30 MG/0.3ML ~~LOC~~ SOLN
30.0000 mg | Freq: Every day | SUBCUTANEOUS | Status: DC
  Administered 2015-02-15 – 2015-02-20 (×6): 30 mg via SUBCUTANEOUS
  Filled 2015-02-15 (×7): qty 0.3

## 2015-02-15 MED ORDER — OXYCODONE HCL 5 MG PO TABS
5.0000 mg | ORAL_TABLET | ORAL | Status: DC | PRN
  Administered 2015-02-15 – 2015-02-17 (×6): 5 mg via ORAL
  Filled 2015-02-15 (×7): qty 1

## 2015-02-15 MED ORDER — INSULIN ASPART 100 UNIT/ML ~~LOC~~ SOLN
0.0000 [IU] | SUBCUTANEOUS | Status: DC
  Administered 2015-02-15: 4 [IU] via SUBCUTANEOUS
  Administered 2015-02-15: 2 [IU] via SUBCUTANEOUS
  Administered 2015-02-16: 4 [IU] via SUBCUTANEOUS
  Administered 2015-02-16 (×3): 2 [IU] via SUBCUTANEOUS
  Administered 2015-02-16: 8 [IU] via SUBCUTANEOUS
  Administered 2015-02-17: 4 [IU] via SUBCUTANEOUS
  Administered 2015-02-17: 2 [IU] via SUBCUTANEOUS
  Administered 2015-02-17 (×3): 4 [IU] via SUBCUTANEOUS
  Administered 2015-02-18: 2 [IU] via SUBCUTANEOUS

## 2015-02-15 MED FILL — Heparin Sodium (Porcine) Inj 1000 Unit/ML: INTRAMUSCULAR | Qty: 60 | Status: AC

## 2015-02-15 MED FILL — Lidocaine HCl IV Inj 20 MG/ML: INTRAVENOUS | Qty: 5 | Status: AC

## 2015-02-15 MED FILL — Mannitol IV Soln 20%: INTRAVENOUS | Qty: 500 | Status: AC

## 2015-02-15 MED FILL — Electrolyte-R (PH 7.4) Solution: INTRAVENOUS | Qty: 3000 | Status: AC

## 2015-02-15 MED FILL — Potassium Chloride Inj 2 mEq/ML: INTRAVENOUS | Qty: 40 | Status: AC

## 2015-02-15 MED FILL — Heparin Sodium (Porcine) Inj 1000 Unit/ML: INTRAMUSCULAR | Qty: 20 | Status: AC

## 2015-02-15 MED FILL — Sodium Bicarbonate IV Soln 8.4%: INTRAVENOUS | Qty: 50 | Status: AC

## 2015-02-15 MED FILL — Sodium Chloride IV Soln 0.9%: INTRAVENOUS | Qty: 2000 | Status: AC

## 2015-02-15 MED FILL — Magnesium Sulfate Inj 50%: INTRAMUSCULAR | Qty: 10 | Status: AC

## 2015-02-15 NOTE — Progress Notes (Addendum)
[facility] DAILY ICU PROGRESS NOTE                   [address]            [place] [address]          [phone]   1 Day Post-Op Procedure(s) (LRB): CORONARY ARTERY BYPASS GRAFTING (CABG) x three, using left internal mammary artery and left leg greater saphenous vein harvested endoscopically (N/A) TRANSESOPHAGEAL ECHOCARDIOGRAM (TEE) (N/A)  Total Length of Stay:  LOS: 33 days   Subjective: Awake, alert, extubated.  No complaints this am.   Objective: Vital signs in last 24 hours: Temp:  [95.7 F (35.4 C)-100.6 F (38.1 C)] 99 F (37.2 C) (06/10 0800) Pulse Rate:  [84-91] 87 (06/10 0830) Cardiac Rhythm:  [-] Atrial paced (06/10 0800) Resp:  [8-30] 23 (06/10 0830) BP: (78-154)/(42-71) 105/53 mmHg (06/10 0800) SpO2:  [97 %-100 %] 100 % (06/10 0830) Arterial Line BP: (90-138)/(43-62) 109/46 mmHg (06/10 0830) FiO2 (%):  [40 %-50 %] 40 % (06/09 1810) Weight:  [177 lb 0.5 oz (80.3 kg)] 177 lb 0.5 oz (80.3 kg) (06/10 0500)  Filed Weights   02/13/15 0621 02/14/15 0558 02/15/15 0500  Weight: 172 lb 6.4 oz (78.2 kg) 170 lb 3.1 oz (77.2 kg) 177 lb 0.5 oz (80.3 kg)    Weight change: 6 lb 13.4 oz (3.1 kg)   Hemodynamic parameters for last 24 hours: PAP: (20-33)/(11-25) 23/12 mmHg CO:  [4.9 L/min-6.9 L/min] 6.2 L/min CI:  [2.4 L/min/m2-3.4 L/min/m2] 2.5 L/min/m2  Intake/Output from previous day: 06/09 0701 - 06/10 0700 In: 7911.3 [P.O.:150; I.V.:4885.3; Blood:1396; NG/GT:30; IV Piggyback:1450] Out: 7050 [Urine:1665; Blood:2400; Chest Tube:1135]  Intake/Output this shift: Total I/O In: 288.5 [I.V.:88.5; IV Piggyback:200] Out: 170 [Urine:100; Chest Tube:70]  CBGs 98-127-115-127-109-82-88-98-90-89   Current Meds: Scheduled Meds: . acetaminophen  1,000 mg Oral 4 times per day   Or  . acetaminophen (TYLENOL) oral liquid 160 mg/5 mL  1,000 mg Per Tube 4 times per day  . antiseptic oral rinse  7 mL Mouth Rinse QID  . aspirin EC  325 mg Oral Daily   Or  .  aspirin  324 mg Per Tube Daily  . bisacodyl  10 mg Oral Daily   Or  . bisacodyl  10 mg Rectal Daily  . cefUROXime (ZINACEF)  IV  1.5 g Intravenous Q12H  . chlorhexidine  15 mL Mouth Rinse BID  . docusate sodium  200 mg Oral Daily  . enoxaparin (LOVENOX) injection  30 mg Subcutaneous QHS  . famotidine (PEPCID) IV  20 mg Intravenous Q12H  . feeding supplement (ENSURE ENLIVE)  237 mL Oral TID WC  . furosemide  20 mg Intravenous BID  . insulin aspart  0-24 Units Subcutaneous 6 times per day  . insulin regular  0-10 Units Intravenous TID WC  . latanoprost  1 drop Both Eyes QHS  . metoprolol tartrate  12.5 mg Oral BID   Or  . metoprolol tartrate  12.5 mg Per Tube BID  . [START ON 02/16/2015] pantoprazole  40 mg Oral Daily  . saccharomyces boulardii  250 mg Oral BID  . sodium chloride  3 mL Intravenous Q12H  . sucralfate  1 g Oral 4 times per day  . vancomycin  1,000 mg Intravenous Q12H   Continuous Infusions: . sodium chloride 20 mL/hr at 02/15/15 0400  . sodium chloride    . sodium chloride 20 mL/hr at 02/14/15 1345  . EPINEPHrine 4 mg in  dextrose 5% 250 mL infusion (16 mcg/mL) Stopped (02/14/15 2000)  . insulin (NOVOLIN-R) infusion Stopped (02/14/15 1700)  . lactated ringers    . lactated ringers 20 mL/hr at 02/15/15 0400  . milrinone 0.2 mcg/kg/min (02/15/15 0800)  . phenylephrine (NEO-SYNEPHRINE) Adult infusion 20 mcg/min (02/15/15 0800)   PRN Meds:.sodium chloride, albumin human, metoprolol, midazolam, morphine injection, ondansetron (ZOFRAN) IV, oxyCODONE, sodium chloride, traMADol  Physical Exam: General appearance: alert, cooperative and no distress Heart: regular rate and rhythm Lungs: diminished breath sounds bibasilar Extremities: Mild LE edema Wound: Dressed and dry  Lab Results: CBC: Recent Labs  02/14/15 1900 02/14/15 1901 02/15/15 0400  WBC 12.3*  --  13.5*  HGB 9.0* 8.8* 9.2*  HCT 27.1* 26.0* 28.4*  PLT 144*  --  167   BMET:  Recent Labs   02/14/15 0450  02/14/15 1901 02/15/15 0400  NA 138  < > 138 137  K 3.9  < > 4.7 4.5  CL 103  < > 103 105  CO2 26  --   --  26  GLUCOSE 125*  < > 115* 90  BUN 18  < > 15 15  CREATININE 1.33*  < > 1.20 1.36*  CALCIUM 7.6*  --   --  7.5*  < > = values in this interval not displayed.  PT/INR:  Recent Labs  02/14/15 1342  LABPROT 18.1*  INR 1.49   Radiology: Dg Chest Port 1 View  02/15/2015   CLINICAL DATA:  CABG.  EXAM: PORTABLE CHEST - 1 VIEW  COMPARISON:  02/14/2015.  FINDINGS: Interim extubation removal of NG tube. Swan-Ganz catheter, mediastinal drainage catheter, bilateral chest tubes are in stable position. Mediastinum hilar structures stable. CABG. Cardiomegaly. Bilateral basilar pulmonary alveolar infiltrates and small pleural effusions are noted. These findings are consistent with congestive heart failure. Bibasilar pneumonia cannot be excluded. Aspirated barium the lung bases again noted. No pneumothorax.  IMPRESSION: 1. Interim extubation removal of NG tube. Remaining lines and tubes including bilateral chest tubes in stable position. No pneumothorax. 2. CABG. Cardiomegaly with bilateral basilar pulmonary alveolar infiltrates and small pleural effusions consistent with congestive heart failure. Bibasilar pneumonia cannot be excluded.   Electronically Signed   By: Maisie Fus  Register   On: 02/15/2015 07:00   Dg Chest Port 1 View  02/14/2015   CLINICAL DATA:  Coronary artery disease.  Status post CABG.  EXAM: PORTABLE CHEST - 1 VIEW  COMPARISON:  02/12/2015  FINDINGS: Chest tubes, Swan-Ganz catheter and endotracheal tube appear in good position. No pneumothorax. Heart size and vascularity are normal. Aspirated barium at the lung bases. Minimal atelectasis at the left lung base. No visible effusions.  IMPRESSION: Satisfactory postoperative appearance of the chest. Minimal linear atelectasis at the left base.   Electronically Signed   By: Francene Boyers M.D.   On: 02/14/2015 14:03      Assessment/Plan: S/P Procedure(s) (LRB): CORONARY ARTERY BYPASS GRAFTING (CABG) x three, using left internal mammary artery and left leg greater saphenous vein harvested endoscopically (N/A) TRANSESOPHAGEAL ECHOCARDIOGRAM (TEE) (N/A)  CV- BPs improving, a-paced at 88.  Gtts: Milrinone, Neo.  Wean gtts as BP improves.  Vol overload- diurese.  Expected postop blood loss anemia- H/H stable.  AKI- Cr elevated this am, UOP has been low overnight but improving this am. Continue to watch.  Mobilize, d/c lines and tubes as able, routine POD 1 progression.   COLLINS,GINA H 02/15/2015 8:40 AM  Patient has ischemic cardiomyopathy so we'll leave on milrinone for the next 24-48  hours Patient will need daily Lasix because of tendency for fluid retention Will resume physical therapy and advance diet cautiously with history of recent pancreatitis from gallstones patient examined and medical record reviewed,agree with above note. Kathlee Nations Trigt III 02/15/2015

## 2015-02-15 NOTE — Progress Notes (Signed)
Utilization Review Completed.  

## 2015-02-15 NOTE — Progress Notes (Signed)
Patient ID: [name] [name], male   DOB: 1941/06/25, 74 y.o.   MRN: [id number] EVENING ROUNDS NOTE :     [address]       [place] [address]             [phone]                 1 Day Post-Op Procedure(s) (LRB): CORONARY ARTERY BYPASS GRAFTING (CABG) x three, using left internal mammary artery and left leg greater saphenous vein harvested endoscopically (N/A) TRANSESOPHAGEAL ECHOCARDIOGRAM (TEE) (N/A)  Total Length of Stay:  LOS: 33 days  BP 90/39 mmHg  Pulse 93  Temp(Src) 98.6 F (37 C) (Oral)  Resp 24  Ht 6' (1.829 m)  Wt 177 lb 0.5 oz (80.3 kg)  BMI 24.00 kg/m2  SpO2 96%  .Intake/Output      06/10 0701 - 06/11 0700   P.O. 360   I.V. (mL/kg) 445.5 (5.5)   Blood    NG/GT    IV Piggyback 250   Total Intake(mL/kg) 1055.5 (13.1)   Urine (mL/kg/hr) 990 (1)   Other    Blood    Chest Tube 180 (0.2)   Total Output 1170   Net -114.6         . sodium chloride 20 mL/hr at 02/15/15 1900  . sodium chloride    . sodium chloride 20 mL/hr at 02/14/15 1345  . EPINEPHrine 4 mg in dextrose 5% 250 mL infusion (16 mcg/mL) Stopped (02/14/15 2000)  . insulin (NOVOLIN-R) infusion Stopped (02/14/15 1700)  . lactated ringers    . lactated ringers Stopped (02/15/15 0900)  . milrinone 0.199 mcg/kg/min (02/15/15 1900)  . phenylephrine (NEO-SYNEPHRINE) Adult infusion Stopped (02/15/15 1430)     Lab Results  Component Value Date   WBC 11.3* 02/15/2015   HGB 9.1* 02/15/2015   HCT 27.9* 02/15/2015   PLT 133* 02/15/2015   GLUCOSE 151* 02/15/2015   CHOL 104 01/15/2015   TRIG 195* 01/19/2015   HDL 26* 01/15/2015   LDLCALC 64 01/15/2015   ALT 26 02/13/2015   AST 30 02/13/2015   NA 136 02/15/2015   K 4.5 02/15/2015   CL 101 02/15/2015   CREATININE 1.54* 02/15/2015   BUN 18 02/15/2015   CO2 26 02/15/2015   INR 1.49 02/14/2015   HGBA1C 7.5* 01/13/2015   Stable day, in chair eating dinner with no abdominal pain On low dose milrinone   [name]  MD  Beeper [phone] Office [phone] 02/15/2015 7:28 PM

## 2015-02-15 NOTE — Op Note (Signed)
[name], [name] NO.:  [id number]  MEDICAL RECORD NO.:  [id number]  LOCATION:  2S05C                        FACILITY:  [facility]  PHYSICIAN:  Kerin Perna, M.D.  DATE OF BIRTH:  Nov 15, 1940  DATE OF PROCEDURE:  02/14/2015 DATE OF DISCHARGE:                              OPERATIVE REPORT   OPERATIONS: 1. Coronary artery bypass grafting x3 (left internal mammary artery to     left anterior descending coronary artery, saphenous vein graft to     diagonal, saphenous vein graft to obtuse marginal). 2. Bilateral lower extremity endoscopic saphenous vein harvest.  SURGEON:  Kerin Perna, M.D.  ASSISTANT:  Pauline Good, PA-C  PREOPERATIVE DIAGNOSES:  Severe 3-vessel coronary artery disease, moderate left ventricular dysfunction, recent non-ST elevation myocardial infarction.  POSTOPERATIVE DIAGNOSES:  Severe 3-vessel coronary artery disease, moderate left ventricular dysfunction, recent non-ST elevation myocardial infarction.  ANESTHESIA:  General by Dr. Elisabeth Most.  INDICATIONS:  The patient is a 74 year old Caucasian male who was admitted to the hospital in extremis with acute abdominal pain, sepsis, and gallstone pancreatitis.  He was resuscitated and improved.  Because cardiac enzymes were positive, he subsequently underwent an echocardiogram which showed EF of 25% to 30% and a subsequent coronary arteriogram showing severe 3-vessel coronary artery disease with chronic occlusion of the RCA, chronic occlusion of the circumflex, and high- grade 90% proximal LAD stenosis.  The patient took several weeks to recover from his acute illness which was complicated by further sepsis, GI bleeding and hypotension, and cardiogenic shock, requiring intubation and subsequent DVT, requiring placement of an inferior vena cava filter. The patient subsequently continued to improve and was transferred out of ICU and was able ambulate up to 500 feet.  His nutritional  status had significantly improved and it was felt that he was adequately optimized for surgical coronary revascularization which was needed prior to him being considered for cholecystectomy to prevent further gallstone pancreatitis.  I discussed the procedure of CABG in detail with the patient and his family including the indications, benefits, alternatives, and expected postoperative recovery.  I discussed with the patient the risks to him of coronary bypass surgery including the risks of bleeding, blood transfusion requirement, stroke, MI, infection, postoperative lung problems including pleural effusion, arrhythmias, and death.  After reviewing these issues, he demonstrated his understanding and agreed to proceed with surgery under what I felt was an informed consent.  OPERATIVE FINDINGS: 1. Severely diseased vessels, suboptimal targets, the right coronary     was not a graftable vessel and there was some inferior wall     scarring. 2. Inadequate conduit from the right leg, adequate vein conduit taken     from the left leg, and an adequate left IMA conduit. 3. Global LV function has improved after revascularization and     separation from cardiopulmonary bypass on the low-dose inotropes.  DESCRIPTION OF PROCEDURE:  The patient was brought to the operating room and placed supine on the operating room table.  General anesthesia was induced under invasive hemodynamic monitoring.  The chest, abdomen, and legs were prepped with Betadine and draped as a sterile field.  A sternal incision was made after a  proper time-out was performed.  A bilateral leg incision was made for endoscopic greater saphenous vein harvest.  The left internal mammary artery was harvested as a pedicle graft from its origin at the subclavian vessels.  It was a 1.5-mm vessel with good flow.  The sternal retractor was placed and the pericardium was opened.  There was a pericardial effusion and bilateral pleural  effusions which were drained.  Pursestrings were placed in the ascending aorta and right atrium and after the heparin had been administered and the ACT was documented as being therapeutic, the patient was cannulated and placed on cardiopulmonary bypass.  The saphenous vein and internal mammary artery were prepared for the distal anastomoses.  Cardioplegia cannulas were placed both antegrade and retrograde cold blood cardioplegia.  The patient was cooled to 32 degrees.  The aortic crossclamp was applied. One liter of cold blood cardioplegia was delivered in split doses between the antegrade aortic and retrograde coronary sinus catheters. There was good cardioplegic arrest.  Septal temperature remained less than 14 degrees.  Cardioplegia was delivered every 20 minutes or less while the crossclamp was in place.  The distal coronary anastomoses were performed.  The first distal anastomosis was to the diagonal branch to the LAD.  This was a 1.5-mm vessel to proximal ostial 90% stenosis.  A reverse saphenous vein was sewn end-to-side with running 7-0 Prolene with good flow through the graft.  The second distal anastomosis was the OM branch of the left coronary. This was totally occluded proximally.  This is a 1.5-mm vessel.  A reverse saphenous vein was sewn end-to-side with running 7-0 Prolene with good flow through the graft.  Cardioplegia was redosed.  The third distal anastomosis was then placed to the distal third of the LAD.  It was a 1.5-mm vessel to proximal 90% stenosis.  It was diffusely diseased and intramyocardial or more proximally.  The left IMA pedicle was brought through an opening in the left lateral pericardium, it was brought down onto the LAD and sewn end-to-side with running 8-0 Prolene. There was good flow through the anastomosis after briefly releasing the pedicle bulldog on the mammary artery.  The bulldog was reapplied and the pedicle was secured to the epicardium  with 6-0 Prolene. Cardioplegia was redosed.  While the crossclamp was still in place, 2 proximal vein anastomoses were performed on the ascending aorta using a 4.0 mm punch and running 6- 0 Prolene.  Prior to tying down the final proximal anastomosis, air was vented from the coronaries with a dose of retrograde warm blood cardioplegia.  The crossclamp was removed.  The heart resumed a spontaneous rhythm.  The vein grafts were de-aired and opened each and each had good flow and hemostasis was documented at the proximal and distal anastomoses. Cardioplegia cannulas removed.  Temporary pacing wires were applied. The patient was rewarmed and reperfused.  The lungs were expanded and the ventilator was resumed.  After the patient was adequately reperfused, the patient was separated from cardiopulmonary bypass without difficulty on low-dose inotropes.  Echocardiogram showed improved global LV function.  Hemodynamics were stable.  Protamine was administered without adverse reaction.  The cannulas were removed.  It should be noted that the patient presented to the operating room with anemia with a hematocrit less than 25 and received 2 units of packed cells during the surgery.  The patient remained hemodynamically stable and still had some coagulopathy after reversal of the heparin with protamine, so he was given a dose of FFP  and platelets with improved coagulation function.  The superior pericardial fat was closed over the aorta and vein grafts. The anterior mediastinal and bilateral pleural chest tubes were placed. The sternum was closed with wire.  The pectoralis fascia was closed with running #1 Vicryl.  The subcutaneous and skin layers were closed in running Vicryl and sterile dressings were applied.  Total cardiopulmonary bypass time was 108 minutes.     Kerin Perna, M.D.     PV/MEDQ  D:  02/14/2015  T:  02/15/2015  Job:  [id number]  cc:   [name] Corporal, MD [name] [name], M.D.

## 2015-02-16 ENCOUNTER — Inpatient Hospital Stay (HOSPITAL_COMMUNITY): Payer: Medicare Other

## 2015-02-16 LAB — GLUCOSE, CAPILLARY
GLUCOSE-CAPILLARY: 101 mg/dL — AB (ref 65–99)
GLUCOSE-CAPILLARY: 155 mg/dL — AB (ref 65–99)
Glucose-Capillary: 145 mg/dL — ABNORMAL HIGH (ref 65–99)
Glucose-Capillary: 148 mg/dL — ABNORMAL HIGH (ref 65–99)
Glucose-Capillary: 212 mg/dL — ABNORMAL HIGH (ref 65–99)

## 2015-02-16 LAB — BASIC METABOLIC PANEL
Anion gap: 7 (ref 5–15)
BUN: 19 mg/dL (ref 6–20)
CO2: 26 mmol/L (ref 22–32)
Calcium: 7.5 mg/dL — ABNORMAL LOW (ref 8.9–10.3)
Chloride: 102 mmol/L (ref 101–111)
Creatinine, Ser: 1.54 mg/dL — ABNORMAL HIGH (ref 0.61–1.24)
GFR calc Af Amer: 50 mL/min — ABNORMAL LOW (ref >60)
GFR calc non Af Amer: 43 mL/min — ABNORMAL LOW (ref >60)
Glucose, Bld: 117 mg/dL — ABNORMAL HIGH (ref 65–99)
Potassium: 4 mmol/L (ref 3.5–5.1)
Sodium: 135 mmol/L (ref 135–145)

## 2015-02-16 LAB — CBC
HCT: 26.7 % — ABNORMAL LOW (ref 39.0–52.0)
Hemoglobin: 8.7 g/dL — ABNORMAL LOW (ref 13.0–17.0)
MCH: 28.2 pg (ref 26.0–34.0)
MCHC: 32.6 g/dL (ref 30.0–36.0)
MCV: 86.4 fL (ref 78.0–100.0)
Platelets: 130 10*3/uL — ABNORMAL LOW (ref 150–400)
RBC: 3.09 MIL/uL — ABNORMAL LOW (ref 4.22–5.81)
RDW: 14.6 % (ref 11.5–15.5)
WBC: 10 10*3/uL (ref 4.0–10.5)

## 2015-02-16 NOTE — Progress Notes (Signed)
Patient ID: [name] [name], male   DOB: 16-Oct-1940, 74 y.o.   MRN: [id number] EVENING ROUNDS NOTE :     [address]       [place] [address]             [phone]                 2 Days Post-Op Procedure(s) (LRB): CORONARY ARTERY BYPASS GRAFTING (CABG) x three, using left internal mammary artery and left leg greater saphenous vein harvested endoscopically (N/A) TRANSESOPHAGEAL ECHOCARDIOGRAM (TEE) (N/A)  Total Length of Stay:  LOS: 34 days  BP 108/45 mmHg  Pulse 87  Temp(Src) 99.1 F (37.3 C) (Oral)  Resp 27  Ht 6' (1.829 m)  Wt 180 lb 12.4 oz (82 kg)  BMI 24.51 kg/m2  SpO2 96%  .Intake/Output      06/11 0701 - 06/12 0700   P.O. 600   I.V. (mL/kg) 34.5 (0.4)   IV Piggyback    Total Intake(mL/kg) 634.5 (7.7)   Urine (mL/kg/hr) 980 (0.9)   Chest Tube 160 (0.1)   Total Output 1140   Net -505.5         . sodium chloride    . EPINEPHrine 4 mg in dextrose 5% 250 mL infusion (16 mcg/mL) Stopped (02/14/15 2000)  . milrinone 0.1 mcg/kg/min (02/16/15 1900)  . phenylephrine (NEO-SYNEPHRINE) Adult infusion Stopped (02/15/15 1430)     Lab Results  Component Value Date   WBC 10.0 02/16/2015   HGB 8.7* 02/16/2015   HCT 26.7* 02/16/2015   PLT 130* 02/16/2015   GLUCOSE 117* 02/16/2015   CHOL 104 01/15/2015   TRIG 195* 01/19/2015   HDL 26* 01/15/2015   LDLCALC 64 01/15/2015   ALT 26 02/13/2015   AST 30 02/13/2015   NA 135 02/16/2015   K 4.0 02/16/2015   CL 102 02/16/2015   CREATININE 1.54* 02/16/2015   BUN 19 02/16/2015   CO2 26 02/16/2015   INR 1.49 02/14/2015   HGBA1C 7.5* 01/13/2015   Stable day  [name] MD  Beeper [phone] Office [phone] 02/16/2015 8:31 PM

## 2015-02-16 NOTE — Progress Notes (Signed)
Patient ID: [name] [name], male   DOB: 09-21-1940, 74 y.o.   MRN: [id number] TCTS DAILY ICU PROGRESS NOTE                   [address]            [place] [address]          [phone]   2 Days Post-Op Procedure(s) (LRB): CORONARY ARTERY BYPASS GRAFTING (CABG) x three, using left internal mammary artery and left leg greater saphenous vein harvested endoscopically (N/A) TRANSESOPHAGEAL ECHOCARDIOGRAM (TEE) (N/A)  Total Length of Stay:  LOS: 34 days   Subjective: Up in chair, no abdominal pain   Objective: Vital signs in last 24 hours: Temp:  [97.5 F (36.4 C)-98.9 F (37.2 C)] 98.9 F (37.2 C) (06/11 0811) Pulse Rate:  [41-94] 94 (06/11 1000) Cardiac Rhythm:  [-] Atrial paced (06/11 0800) Resp:  [18-35] 19 (06/11 1000) BP: (90-146)/(37-61) 127/53 mmHg (06/11 1000) SpO2:  [96 %-99 %] 97 % (06/11 1000) Arterial Line BP: (72-144)/(38-122) 124/44 mmHg (06/10 1600) Weight:  [180 lb 12.4 oz (82 kg)] 180 lb 12.4 oz (82 kg) (06/11 0500)  Filed Weights   02/14/15 0558 02/15/15 0500 02/16/15 0500  Weight: 170 lb 3.1 oz (77.2 kg) 177 lb 0.5 oz (80.3 kg) 180 lb 12.4 oz (82 kg)    Weight change: 3 lb 12 oz (1.7 kg)   Hemodynamic parameters for last 24 hours:    Intake/Output from previous day: 06/10 0701 - 06/11 0700 In: 1810.7 [P.O.:780; I.V.:730.7; IV Piggyback:300] Out: 1920 [Urine:1590; Chest Tube:330]  Intake/Output this shift: Total I/O In: 373.8 [P.O.:360; I.V.:13.8] Out: 325 [Urine:205; Chest Tube:120]  Current Meds: Scheduled Meds: . acetaminophen  1,000 mg Oral 4 times per day   Or  . acetaminophen (TYLENOL) oral liquid 160 mg/5 mL  1,000 mg Per Tube 4 times per day  . aspirin EC  325 mg Oral Daily   Or  . aspirin  324 mg Per Tube Daily  . bisacodyl  10 mg Oral Daily   Or  . bisacodyl  10 mg Rectal Daily  . docusate sodium  200 mg Oral Daily  . enoxaparin (LOVENOX) injection  30 mg Subcutaneous QHS  . feeding supplement (ENSURE ENLIVE)   237 mL Oral TID WC  . furosemide  20 mg Intravenous BID  . insulin aspart  0-24 Units Subcutaneous 6 times per day  . latanoprost  1 drop Both Eyes QHS  . metoprolol tartrate  12.5 mg Oral BID   Or  . metoprolol tartrate  12.5 mg Per Tube BID  . pantoprazole  40 mg Oral Daily  . saccharomyces boulardii  250 mg Oral BID  . sodium chloride  3 mL Intravenous Q12H  . sucralfate  1 g Oral 4 times per day   Continuous Infusions: . sodium chloride    . EPINEPHrine 4 mg in dextrose 5% 250 mL infusion (16 mcg/mL) Stopped (02/14/15 2000)  . milrinone 0.2 mcg/kg/min (02/16/15 1000)  . phenylephrine (NEO-SYNEPHRINE) Adult infusion Stopped (02/15/15 1430)   PRN Meds:.metoprolol, midazolam, morphine injection, ondansetron (ZOFRAN) IV, oxyCODONE, sodium chloride, traMADol  General appearance: alert, cooperative and no distress Neurologic: intact Heart: regular rate and rhythm, S1, S2 normal, no murmur, click, rub or gallop Lungs: diminished breath sounds bibasilar Abdomen: soft, non-tender; bowel sounds normal; no masses,  no organomegaly Extremities: extremities normal, atraumatic, no cyanosis or edema and Homans sign is negative, no sign of DVT Wound: sternum stable dressing  intact  Lab Results: CBC: Recent Labs  02/15/15 1541 02/16/15 0415  WBC 11.3* 10.0  HGB 9.1* 8.7*  HCT 27.9* 26.7*  PLT 133* 130*   BMET:  Recent Labs  02/15/15 0400 02/15/15 1539 02/15/15 1541 02/16/15 0415  NA 137 136  --  135  K 4.5 4.5  --  4.0  CL 105 101  --  102  CO2 26  --   --  26  GLUCOSE 90 151*  --  117*  BUN 15 18  --  19  CREATININE 1.36* 1.50* 1.54* 1.54*  CALCIUM 7.5*  --   --  7.5*    PT/INR:  Recent Labs  02/14/15 1342  LABPROT 18.1*  INR 1.49   Radiology: Dg Chest Port 1 View  02/16/2015   CLINICAL DATA:  Coronary artery bypass graft  EXAM: PORTABLE CHEST - 1 VIEW  COMPARISON:  02/15/2015  FINDINGS: Mediastinal drain and bilateral chest tubes remain. The Swan-Ganz catheter has  been removed. The right jugular sheath remains.  There is no pneumothorax.  There is mild left base atelectasis.  IMPRESSION: Removal of Swan-Ganz catheter.  Persistent left base atelectasis.   Electronically Signed   By: Ellery Plunk M.D.   On: 02/16/2015 06:13   Chronic Kidney Disease   Stage I     GFR >90  Stage II    GFR 60-89  Stage IIIA GFR 45-59  Stage IIIB GFR 30-44  Stage IV   GFR 15-29  Stage V    GFR  <15  Lab Results  Component Value Date   CREATININE 1.54* 02/16/2015   Estimated Creatinine Clearance: 47.6 mL/min (by C-G formula based on Cr of 1.54).   Assessment/Plan: S/P Procedure(s) (LRB): CORONARY ARTERY BYPASS GRAFTING (CABG) x three, using left internal mammary artery and left leg greater saphenous vein harvested endoscopically (N/A) TRANSESOPHAGEAL ECHOCARDIOGRAM (TEE) (N/A) Mobilize Diuresis Diabetes control d/c tubes/lines Continue foley due to strict I&O,  and urinary output monitoring Stage IIIA ckd , cr changed less then 0.3 past two days Wean milrinone    [name] 02/16/2015 10:48 AM

## 2015-02-16 NOTE — Progress Notes (Signed)
PT Cancellation Note  Patient Details Name: [name] [name] MRN: [id number] DOB: 09-05-1941   Cancelled Treatment:    Reason Eval/Treat Not Completed: Patient not medically ready. Pt received up in chair in good spirits. RN deferred ambulation at this time due to pt still having mediastinal tubes in place. Per RN pt getting up OOB with minimal assist and doing great. Will await removal of tubes and progress ambulation as able.   Marcene Brawn 02/16/2015, 8:23 AM   Lewis Shock, PT, DPT Pager #: [phone] Office #: [phone]

## 2015-02-17 ENCOUNTER — Inpatient Hospital Stay (HOSPITAL_COMMUNITY): Payer: Medicare Other

## 2015-02-17 LAB — GLUCOSE, CAPILLARY
GLUCOSE-CAPILLARY: 113 mg/dL — AB (ref 65–99)
GLUCOSE-CAPILLARY: 107 mg/dL — AB (ref 65–99)
GLUCOSE-CAPILLARY: 182 mg/dL — AB (ref 65–99)
Glucose-Capillary: 127 mg/dL — ABNORMAL HIGH (ref 65–99)
Glucose-Capillary: 168 mg/dL — ABNORMAL HIGH (ref 65–99)
Glucose-Capillary: 176 mg/dL — ABNORMAL HIGH (ref 65–99)
Glucose-Capillary: 194 mg/dL — ABNORMAL HIGH (ref 65–99)
Glucose-Capillary: 196 mg/dL — ABNORMAL HIGH (ref 65–99)

## 2015-02-17 LAB — BASIC METABOLIC PANEL
Anion gap: 8 (ref 5–15)
BUN: 20 mg/dL (ref 6–20)
CO2: 27 mmol/L (ref 22–32)
Calcium: 7.4 mg/dL — ABNORMAL LOW (ref 8.9–10.3)
Chloride: 101 mmol/L (ref 101–111)
Creatinine, Ser: 1.49 mg/dL — ABNORMAL HIGH (ref 0.61–1.24)
GFR calc Af Amer: 52 mL/min — ABNORMAL LOW (ref >60)
GFR calc non Af Amer: 45 mL/min — ABNORMAL LOW (ref >60)
Glucose, Bld: 82 mg/dL (ref 65–99)
Potassium: 3.9 mmol/L (ref 3.5–5.1)
Sodium: 136 mmol/L (ref 135–145)

## 2015-02-17 LAB — CBC
HCT: 26.1 % — ABNORMAL LOW (ref 39.0–52.0)
Hemoglobin: 8.4 g/dL — ABNORMAL LOW (ref 13.0–17.0)
MCH: 27.5 pg (ref 26.0–34.0)
MCHC: 32.2 g/dL (ref 30.0–36.0)
MCV: 85.6 fL (ref 78.0–100.0)
Platelets: 137 10*3/uL — ABNORMAL LOW (ref 150–400)
RBC: 3.05 MIL/uL — ABNORMAL LOW (ref 4.22–5.81)
RDW: 14.4 % (ref 11.5–15.5)
WBC: 9.4 10*3/uL (ref 4.0–10.5)

## 2015-02-17 LAB — TYPE AND SCREEN
ABO/RH(D): A POS
Antibody Screen: NEGATIVE
Unit division: 0
Unit division: 0
Unit division: 0
Unit division: 0
Unit division: 0
Unit division: 0

## 2015-02-17 NOTE — Evaluation (Signed)
Physical TherapyRe-Evaluation Patient Details Name: [name] [name] MRN: [id number] DOB: 1941-01-02 Today's Date: 02/17/2015   History of Present Illness  Admitted with epigastric pain significant - pancreatitis; also with NSTEMI; Cardiac cath revealed total RCA, total prox cfx, severe prox LAD, 50% dz of dominant diagonal.  Increased WOB and GIB with pt transferred to ICU and intubated 5/16 to 5/18. 5/19 bil LE DVTs; s/p IVC; underwent CABG x 3 02/14/15  Clinical Impression  Patient presents with dependencies in mobility, limited by decreased endurance, balance, cardiopulmonary status.  Patient will benefit from continued PT to progress mobility and increase independence for return home with wife.  PT will coordinate with cardiac rehab to progress patient.   Follow Up Recommendations No PT follow up (cardiac rehab)    Equipment Recommendations  Rolling walker with 5" wheels    Recommendations for Other Services       Precautions / Restrictions Precautions Precautions: Fall Restrictions Weight Bearing Restrictions:  (sternal precautions)      Mobility  Bed Mobility               General bed mobility comments: up in chair upon arrival  Transfers Overall transfer level: Needs assistance Equipment used: Rolling walker (2 wheeled) Transfers: Sit to/from Stand Sit to Stand: Min assist         General transfer comment: unable to stand without weight bearing through UE's - mimimal weight through UE's; on return to sitting -able to place hands on knees to lower self to chair  Ambulation/Gait Ambulation/Gait assistance: Min assist Ambulation Distance (Feet): 200 Feet Assistive device: Rolling walker (2 wheeled) Gait Pattern/deviations: WFL(Within Functional Limits);Decreased stride length Gait velocity: very slow      Stairs            Wheelchair Mobility    Modified Rankin (Stroke Patients Only)       Balance Overall balance assessment: Needs  assistance Sitting-balance support: No upper extremity supported Sitting balance-Leahy Scale: Fair     Standing balance support: Bilateral upper extremity supported Standing balance-Leahy Scale: Poor                               Pertinent Vitals/Pain Faces Pain Scale: Hurts a little bit Pain Location: sternum Pain Descriptors / Indicators: Aching Pain Intervention(s): Limited activity within patient's tolerance;Monitored during session    Home Living Family/patient expects to be discharged to:: Private residence Living Arrangements: Spouse/significant other Available Help at Discharge: Family;Available 24 hours/day Type of Home: House Home Access: Stairs to enter Entrance Stairs-Rails: Doctor, general practice of Steps: 3 Home Layout: Two level;Able to live on main level with bedroom/bathroom;Full bath on main level Home Equipment: Walker - 2 wheels      Prior Function Level of Independence: Independent               Hand Dominance   Dominant Hand: Right    Extremity/Trunk Assessment   Upper Extremity Assessment:  (not fully tested due to sternal precautions)           Lower Extremity Assessment: Generalized weakness         Communication   Communication: No difficulties  Cognition Arousal/Alertness: Awake/alert Behavior During Therapy: WFL for tasks assessed/performed Overall Cognitive Status: Within Functional Limits for tasks assessed               Problem Solving: Slow processing;Decreased initiation;Difficulty sequencing;Requires verbal cues      General Comments  Exercises General Exercises - Lower Extremity Ankle Circles/Pumps: AROM;Both;10 reps;Seated Quad Sets: AROM;Both;10 reps;Seated      Assessment/Plan    PT Assessment Patient needs continued PT services  PT Diagnosis Difficulty walking   PT Problem List Decreased strength;Decreased activity tolerance;Decreased balance;Decreased  mobility;Decreased knowledge of use of DME;Cardiopulmonary status limiting activity  PT Treatment Interventions DME instruction;Gait training;Stair training;Functional mobility training;Therapeutic activities;Therapeutic exercise;Balance training;Patient/family education   PT Goals (Current goals can be found in the Care Plan section) Acute Rehab PT Goals Patient Stated Goal: to get better PT Goal Formulation: With patient/family Time For Goal Achievement: 03/03/15 Potential to Achieve Goals: Good    Frequency Min 3X/week   Barriers to discharge        Co-evaluation               End of Session Equipment Utilized During Treatment: Gait belt Activity Tolerance: Patient tolerated treatment well Patient left: in chair;with call bell/phone within reach;with family/visitor present           Time: 1020-1050 PT Time Calculation (min) (ACUTE ONLY): 30 min   Charges:   PT Evaluation $Initial PT Evaluation Tier I: 1 Procedure PT Treatments $Gait Training: 8-22 mins   PT G CodesOlivia Canter 02/17/2015, 11:34 AM  02/17/2015 Corlis Hove, PT [phone]

## 2015-02-17 NOTE — Progress Notes (Signed)
Patient ID: [name] [name], male   DOB: 09/07/1941, 74 y.o.   MRN: [id number] TCTS DAILY ICU PROGRESS NOTE                   [address]            [place] [address]          [phone]   3 Days Post-Op Procedure(s) (LRB): CORONARY ARTERY BYPASS GRAFTING (CABG) x three, using left internal mammary artery and left leg greater saphenous vein harvested endoscopically (N/A) TRANSESOPHAGEAL ECHOCARDIOGRAM (TEE) (N/A)  Total Length of Stay:  LOS: 35 days   Subjective: Feels well this am, no abdominal pain  Objective: Vital signs in last 24 hours: Temp:  [98.9 F (37.2 C)-99.5 F (37.5 C)] 99.3 F (37.4 C) (06/12 0808) Pulse Rate:  [71-94] 84 (06/12 0800) Cardiac Rhythm:  [-] Normal sinus rhythm (06/12 0800) Resp:  [14-27] 24 (06/12 0800) BP: (103-146)/(45-63) 137/63 mmHg (06/12 0800) SpO2:  [94 %-99 %] 97 % (06/12 0800) Weight:  [180 lb 5.4 oz (81.8 kg)] 180 lb 5.4 oz (81.8 kg) (06/12 0500)  Filed Weights   02/15/15 0500 02/16/15 0500 02/17/15 0500  Weight: 177 lb 0.5 oz (80.3 kg) 180 lb 12.4 oz (82 kg) 180 lb 5.4 oz (81.8 kg)    Weight change: -7.1 oz (-0.2 kg)   Hemodynamic parameters for last 24 hours:    Intake/Output from previous day: 06/11 0701 - 06/12 0700 In: 1142.1 [P.O.:1080; I.V.:62.1] Out: 1640 [Urine:1480; Chest Tube:160]  Intake/Output this shift: Total I/O In: 2.3 [I.V.:2.3] Out: -   Current Meds: Scheduled Meds: . acetaminophen  1,000 mg Oral 4 times per day   Or  . acetaminophen (TYLENOL) oral liquid 160 mg/5 mL  1,000 mg Per Tube 4 times per day  . aspirin EC  325 mg Oral Daily   Or  . aspirin  324 mg Per Tube Daily  . bisacodyl  10 mg Oral Daily   Or  . bisacodyl  10 mg Rectal Daily  . docusate sodium  200 mg Oral Daily  . enoxaparin (LOVENOX) injection  30 mg Subcutaneous QHS  . feeding supplement (ENSURE ENLIVE)  237 mL Oral TID WC  . furosemide  20 mg Intravenous BID  . insulin aspart  0-24 Units Subcutaneous 6 times  per day  . latanoprost  1 drop Both Eyes QHS  . metoprolol tartrate  12.5 mg Oral BID   Or  . metoprolol tartrate  12.5 mg Per Tube BID  . pantoprazole  40 mg Oral Daily  . saccharomyces boulardii  250 mg Oral BID  . sodium chloride  3 mL Intravenous Q12H  . sucralfate  1 g Oral 4 times per day   Continuous Infusions: . sodium chloride    . EPINEPHrine 4 mg in dextrose 5% 250 mL infusion (16 mcg/mL) Stopped (02/14/15 2000)  . phenylephrine (NEO-SYNEPHRINE) Adult infusion Stopped (02/15/15 1430)   PRN Meds:.metoprolol, midazolam, morphine injection, ondansetron (ZOFRAN) IV, oxyCODONE, sodium chloride, traMADol  General appearance: alert, cooperative and no distress Neurologic: intact Heart: regular rate and rhythm, S1, S2 normal, no murmur, click, rub or gallop Lungs: diminished breath sounds bibasilar Abdomen: soft, non-tender; bowel sounds normal; no masses,  no organomegaly Extremities: edema 2+ both legs Wound: sternum intact  Lab Results: CBC: Recent Labs  02/16/15 0415 02/17/15 0400  WBC 10.0 9.4  HGB 8.7* 8.4*  HCT 26.7* 26.1*  PLT 130* 137*   BMET:  Recent Labs  02/16/15 0415 02/17/15 0400  NA 135 136  K 4.0 3.9  CL 102 101  CO2 26 27  GLUCOSE 117* 82  BUN 19 20  CREATININE 1.54* 1.49*  CALCIUM 7.5* 7.4*    PT/INR:  Recent Labs  02/14/15 1342  LABPROT 18.1*  INR 1.49   Lab Results  Component Value Date   LIPASE 15* 01/27/2015     Radiology: Dg Chest Port 1 View  02/17/2015   CLINICAL DATA:  Status post CABG x3.  Hypertension and diabetes.  EXAM: PORTABLE CHEST - 1 VIEW  COMPARISON:  1 day prior  FINDINGS: Right IJ Cordis sheath remains in place. Removal of bilateral chest tubes and mediastinal drain. Patient rotated left. Cardiomegaly accentuated by AP portable technique. Prior median sternotomy. Contrast within both lung bases, consistent with aspiration from prior attempted esophagram. Small left pleural effusion. No pneumothorax. Similar left  worse than right basilar airspace disease.  IMPRESSION: Removal of bilateral chest tubes and mediastinal drain. No pneumothorax.  Similar bibasilar airspace disease and small left pleural effusion. Evidence of contrast aspiration, similar.   Electronically Signed   By: Jeronimo Greaves M.D.   On: 02/17/2015 07:45     Assessment/Plan: S/P Procedure(s) (LRB): CORONARY ARTERY BYPASS GRAFTING (CABG) x three, using left internal mammary artery and left leg greater saphenous vein harvested endoscopically (N/A) TRANSESOPHAGEAL ECHOCARDIOGRAM (TEE) (N/A) Mobilize Diuresis Diabetes control Wean off milrinone today Renal function stable   [name] [name] 02/17/2015 8:37 AM

## 2015-02-17 NOTE — Progress Notes (Signed)
Patient ID: [name] [name], male   DOB: 18-May-1941, 74 y.o.   MRN: [id number] EVENING ROUNDS NOTE :     [address]       [place] [address]             [phone]                 3 Days Post-Op Procedure(s) (LRB): CORONARY ARTERY BYPASS GRAFTING (CABG) x three, using left internal mammary artery and left leg greater saphenous vein harvested endoscopically (N/A) TRANSESOPHAGEAL ECHOCARDIOGRAM (TEE) (N/A)  Total Length of Stay:  LOS: 35 days  BP 93/60 mmHg  Pulse 82  Temp(Src) 97.7 F (36.5 C) (Oral)  Resp 15  Ht 6' (1.829 m)  Wt 180 lb 5.4 oz (81.8 kg)  BMI 24.45 kg/m2  SpO2 96%  .Intake/Output      06/11 0701 - 06/12 0700 06/12 0701 - 06/13 0700   P.O. 1080 360   I.V. (mL/kg) 62.1 (0.8) 2.3 (0)   IV Piggyback     Total Intake(mL/kg) 1142.1 (14) 362.3 (4.4)   Urine (mL/kg/hr) 1480 (0.8) 1275 (1.5)   Chest Tube 160 (0.1)    Total Output 1640 1275   Net -497.9 -912.7          . sodium chloride    . EPINEPHrine 4 mg in dextrose 5% 250 mL infusion (16 mcg/mL) Stopped (02/14/15 2000)  . phenylephrine (NEO-SYNEPHRINE) Adult infusion Stopped (02/15/15 1430)     Lab Results  Component Value Date   WBC 9.4 02/17/2015   HGB 8.4* 02/17/2015   HCT 26.1* 02/17/2015   PLT 137* 02/17/2015   GLUCOSE 82 02/17/2015   CHOL 104 01/15/2015   TRIG 195* 01/19/2015   HDL 26* 01/15/2015   LDLCALC 64 01/15/2015   ALT 26 02/13/2015   AST 30 02/13/2015   NA 136 02/17/2015   K 3.9 02/17/2015   CL 101 02/17/2015   CREATININE 1.49* 02/17/2015   BUN 20 02/17/2015   CO2 27 02/17/2015   INR 1.49 02/14/2015   HGBA1C 7.5* 01/13/2015   This patient walking around for 3 time today, pt saw patient today   [name] MD  Beeper [phone] Office [phone] 02/17/2015 5:44 PM   ]

## 2015-02-18 ENCOUNTER — Inpatient Hospital Stay (HOSPITAL_COMMUNITY): Payer: Medicare Other

## 2015-02-18 DIAGNOSIS — Z951 Presence of aortocoronary bypass graft: Secondary | ICD-10-CM

## 2015-02-18 DIAGNOSIS — A419 Sepsis, unspecified organism: Secondary | ICD-10-CM

## 2015-02-18 DIAGNOSIS — R6521 Severe sepsis with septic shock: Secondary | ICD-10-CM

## 2015-02-18 LAB — GLUCOSE, CAPILLARY
GLUCOSE-CAPILLARY: 154 mg/dL — AB (ref 65–99)
GLUCOSE-CAPILLARY: 154 mg/dL — AB (ref 65–99)
GLUCOSE-CAPILLARY: 176 mg/dL — AB (ref 65–99)
GLUCOSE-CAPILLARY: 159 mg/dL — AB (ref 65–99)
Glucose-Capillary: 142 mg/dL — ABNORMAL HIGH (ref 65–99)

## 2015-02-18 LAB — CBC
HCT: 26.3 % — ABNORMAL LOW (ref 39.0–52.0)
Hemoglobin: 8.5 g/dL — ABNORMAL LOW (ref 13.0–17.0)
MCH: 27.8 pg (ref 26.0–34.0)
MCHC: 32.3 g/dL (ref 30.0–36.0)
MCV: 85.9 fL (ref 78.0–100.0)
Platelets: 159 10*3/uL (ref 150–400)
RBC: 3.06 MIL/uL — ABNORMAL LOW (ref 4.22–5.81)
RDW: 14.4 % (ref 11.5–15.5)
WBC: 7 10*3/uL (ref 4.0–10.5)

## 2015-02-18 LAB — BASIC METABOLIC PANEL
Anion gap: 8 (ref 5–15)
BUN: 23 mg/dL — ABNORMAL HIGH (ref 6–20)
CO2: 26 mmol/L (ref 22–32)
Calcium: 7.4 mg/dL — ABNORMAL LOW (ref 8.9–10.3)
Chloride: 102 mmol/L (ref 101–111)
Creatinine, Ser: 1.46 mg/dL — ABNORMAL HIGH (ref 0.61–1.24)
GFR calc Af Amer: 54 mL/min — ABNORMAL LOW (ref >60)
GFR calc non Af Amer: 46 mL/min — ABNORMAL LOW (ref >60)
Glucose, Bld: 153 mg/dL — ABNORMAL HIGH (ref 65–99)
Potassium: 3.8 mmol/L (ref 3.5–5.1)
Sodium: 136 mmol/L (ref 135–145)

## 2015-02-18 MED ORDER — WARFARIN - PHYSICIAN DOSING INPATIENT
Freq: Every day | Status: DC
  Administered 2015-02-20: 18:00:00

## 2015-02-18 MED ORDER — ATORVASTATIN CALCIUM 40 MG PO TABS
40.0000 mg | ORAL_TABLET | Freq: Every day | ORAL | Status: DC
  Filled 2015-02-18 (×2): qty 1

## 2015-02-18 MED ORDER — INSULIN ASPART 100 UNIT/ML ~~LOC~~ SOLN
0.0000 [IU] | Freq: Three times a day (TID) | SUBCUTANEOUS | Status: DC
  Administered 2015-02-18: 2 [IU] via SUBCUTANEOUS
  Administered 2015-02-18 – 2015-02-19 (×4): 4 [IU] via SUBCUTANEOUS
  Administered 2015-02-19 – 2015-02-20 (×2): 2 [IU] via SUBCUTANEOUS
  Administered 2015-02-20: 4 [IU] via SUBCUTANEOUS
  Administered 2015-02-20: 8 [IU] via SUBCUTANEOUS
  Administered 2015-02-20 – 2015-02-21 (×2): 2 [IU] via SUBCUTANEOUS

## 2015-02-18 MED ORDER — SODIUM CHLORIDE 0.9 % IJ SOLN
3.0000 mL | Freq: Two times a day (BID) | INTRAMUSCULAR | Status: DC
  Administered 2015-02-18: 10 mL via INTRAVENOUS
  Administered 2015-02-18 – 2015-02-21 (×5): 3 mL via INTRAVENOUS

## 2015-02-18 MED ORDER — SODIUM CHLORIDE 0.9 % IJ SOLN: 3.0000 mL | INTRAMUSCULAR | Status: DC | PRN

## 2015-02-18 MED ORDER — FUROSEMIDE 40 MG PO TABS
40.0000 mg | ORAL_TABLET | Freq: Every day | ORAL | Status: DC
  Administered 2015-02-18 – 2015-02-21 (×4): 40 mg via ORAL
  Filled 2015-02-18 (×4): qty 1

## 2015-02-18 MED ORDER — MOVING RIGHT ALONG BOOK
Freq: Once | Status: AC
  Administered 2015-02-18: 08:00:00
  Filled 2015-02-18: qty 1

## 2015-02-18 MED ORDER — MAGNESIUM HYDROXIDE 400 MG/5ML PO SUSP: 30.0000 mL | Freq: Every day | ORAL | Status: DC | PRN

## 2015-02-18 MED ORDER — FE FUMARATE-B12-VIT C-FA-IFC PO CAPS
1.0000 | ORAL_CAPSULE | Freq: Three times a day (TID) | ORAL | Status: DC
  Administered 2015-02-18 – 2015-02-21 (×8): 1 via ORAL
  Filled 2015-02-18 (×11): qty 1

## 2015-02-18 MED ORDER — SODIUM CHLORIDE 0.9 % IV SOLN: 250.0000 mL | INTRAVENOUS | Status: DC | PRN

## 2015-02-18 MED ORDER — ASPIRIN EC 81 MG PO TBEC
81.0000 mg | DELAYED_RELEASE_TABLET | Freq: Every day | ORAL | Status: DC
  Administered 2015-02-19 – 2015-02-21 (×3): 81 mg via ORAL
  Filled 2015-02-18 (×3): qty 1

## 2015-02-18 MED ORDER — ATORVASTATIN CALCIUM 40 MG PO TABS
40.0000 mg | ORAL_TABLET | Freq: Every day | ORAL | Status: DC
  Administered 2015-02-18 – 2015-02-20 (×3): 40 mg via ORAL
  Filled 2015-02-18 (×4): qty 1

## 2015-02-18 MED ORDER — WARFARIN SODIUM 2 MG PO TABS
2.0000 mg | ORAL_TABLET | Freq: Once | ORAL | Status: AC
  Administered 2015-02-18: 2 mg via ORAL
  Filled 2015-02-18: qty 1

## 2015-02-18 MED ORDER — POTASSIUM CHLORIDE CRYS ER 20 MEQ PO TBCR
20.0000 meq | EXTENDED_RELEASE_TABLET | Freq: Every day | ORAL | Status: DC
  Administered 2015-02-19 – 2015-02-21 (×3): 20 meq via ORAL
  Filled 2015-02-18 (×3): qty 1

## 2015-02-18 NOTE — Progress Notes (Signed)
Pt transferred to 2w24; pt sitting up in chair; VSS; pt denies pain; pt oriented to room; call bell w/i reach; pt encouarged to call for assistance when getting up out of chair or bed; will cont. To monitor.

## 2015-02-18 NOTE — Progress Notes (Signed)
Report to 2 W RN 

## 2015-02-18 NOTE — Progress Notes (Signed)
Subjective:  POD # 4 CABG X 3. Ambulating w/o difficulty  Objective:  Temp:  [97.7 F (36.5 C)-98.3 F (36.8 C)] 97.7 F (36.5 C) (06/13 1213) Pulse Rate:  [73-87] 84 (06/13 1400) Resp:  [15-30] 24 (06/13 1400) BP: (87-143)/(45-88) 127/56 mmHg (06/13 1400) SpO2:  [96 %-100 %] 98 % (06/13 1400) Weight:  [177 lb 7.5 oz (80.5 kg)] 177 lb 7.5 oz (80.5 kg) (06/13 0500) Weight change: -2 lb 13.9 oz (-1.3 kg)  Intake/Output from previous day: 06/12 0701 - 06/13 0700 In: 412.3 [P.O.:410; I.V.:2.3] Out: 1925 [Urine:1925]  Intake/Output from this shift: Total I/O In: 400 [P.O.:360; I.V.:40] Out: 3 [Urine:1; Stool:2]  Physical Exam: General appearance: alert and no distress Neck: no adenopathy, no carotid bruit, no JVD, supple, symmetrical, trachea midline and thyroid not enlarged, symmetric, no tenderness/mass/nodules Lungs: clear to auscultation bilaterally Heart: regular rate and rhythm, S1, S2 normal, no murmur, click, rub or gallop Extremities: extremities normal, atraumatic, no cyanosis or edema  Lab Results: Results for orders placed or performed during the hospital encounter of 01/13/15 (from the past 48 hour(s))  Glucose, capillary     Status: Abnormal   Collection Time: 02/16/15  4:10 PM  Result Value Ref Range   Glucose-Capillary 194 (H) 65 - 99 mg/dL   Comment 1 Capillary Specimen    Comment 2 Notify RN   Glucose, capillary     Status: Abnormal   Collection Time: 02/16/15  7:47 PM  Result Value Ref Range   Glucose-Capillary 212 (H) 65 - 99 mg/dL   Comment 1 Notify RN    Comment 2 Document in Chart   Glucose, capillary     Status: Abnormal   Collection Time: 02/17/15 12:01 AM  Result Value Ref Range   Glucose-Capillary 127 (H) 65 - 99 mg/dL   Comment 1 Notify RN    Comment 2 Document in Chart   Glucose, capillary     Status: Abnormal   Collection Time: 02/17/15  3:59 AM  Result Value Ref Range   Glucose-Capillary 113 (H) 65 - 99 mg/dL   Comment 1  Notify RN    Comment 2 Document in Chart   CBC     Status: Abnormal   Collection Time: 02/17/15  4:00 AM  Result Value Ref Range   WBC 9.4 4.0 - 10.5 K/uL   RBC 3.05 (L) 4.22 - 5.81 MIL/uL   Hemoglobin 8.4 (L) 13.0 - 17.0 g/dL   HCT 26.1 (L) 39.0 - 52.0 %   MCV 85.6 78.0 - 100.0 fL   MCH 27.5 26.0 - 34.0 pg   MCHC 32.2 30.0 - 36.0 g/dL   RDW 14.4 11.5 - 15.5 %   Platelets 137 (L) 150 - 400 K/uL  Basic metabolic panel     Status: Abnormal   Collection Time: 02/17/15  4:00 AM  Result Value Ref Range   Sodium 136 135 - 145 mmol/L   Potassium 3.9 3.5 - 5.1 mmol/L   Chloride 101 101 - 111 mmol/L   CO2 27 22 - 32 mmol/L   Glucose, Bld 82 65 - 99 mg/dL   BUN 20 6 - 20 mg/dL   Creatinine, Ser 1.49 (H) 0.61 - 1.24 mg/dL   Calcium 7.4 (L) 8.9 - 10.3 mg/dL   GFR calc non Af Amer 45 (L) >60 mL/min   GFR calc Af Amer 52 (L) >60 mL/min    Comment: (NOTE) The eGFR has been calculated using the CKD EPI equation. This calculation  has not been validated in all clinical situations. eGFR's persistently <60 mL/min signify possible Chronic Kidney Disease.    Anion gap 8 5 - 15  Glucose, capillary     Status: Abnormal   Collection Time: 02/17/15  8:04 AM  Result Value Ref Range   Glucose-Capillary 107 (H) 65 - 99 mg/dL   Comment 1 Capillary Specimen    Comment 2 Notify RN   Glucose, capillary     Status: Abnormal   Collection Time: 02/17/15 11:41 AM  Result Value Ref Range   Glucose-Capillary 182 (H) 65 - 99 mg/dL   Comment 1 Capillary Specimen    Comment 2 Notify RN   Glucose, capillary     Status: Abnormal   Collection Time: 02/17/15  4:01 PM  Result Value Ref Range   Glucose-Capillary 168 (H) 65 - 99 mg/dL   Comment 1 Capillary Specimen    Comment 2 Notify RN   Glucose, capillary     Status: Abnormal   Collection Time: 02/17/15  7:14 PM  Result Value Ref Range   Glucose-Capillary 196 (H) 65 - 99 mg/dL   Comment 1 Notify RN    Comment 2 Document in Chart   Glucose, capillary      Status: Abnormal   Collection Time: 02/17/15 11:15 PM  Result Value Ref Range   Glucose-Capillary 176 (H) 65 - 99 mg/dL   Comment 1 Notify RN    Comment 2 Document in Chart   Glucose, capillary     Status: Abnormal   Collection Time: 02/18/15  3:57 AM  Result Value Ref Range   Glucose-Capillary 154 (H) 65 - 99 mg/dL   Comment 1 Notify RN    Comment 2 Document in Chart   CBC     Status: Abnormal   Collection Time: 02/18/15  4:30 AM  Result Value Ref Range   WBC 7.0 4.0 - 10.5 K/uL   RBC 3.06 (L) 4.22 - 5.81 MIL/uL   Hemoglobin 8.5 (L) 13.0 - 17.0 g/dL   HCT 26.3 (L) 39.0 - 52.0 %   MCV 85.9 78.0 - 100.0 fL   MCH 27.8 26.0 - 34.0 pg   MCHC 32.3 30.0 - 36.0 g/dL   RDW 14.4 11.5 - 15.5 %   Platelets 159 150 - 400 K/uL  Basic metabolic panel     Status: Abnormal   Collection Time: 02/18/15  4:30 AM  Result Value Ref Range   Sodium 136 135 - 145 mmol/L   Potassium 3.8 3.5 - 5.1 mmol/L   Chloride 102 101 - 111 mmol/L   CO2 26 22 - 32 mmol/L   Glucose, Bld 153 (H) 65 - 99 mg/dL   BUN 23 (H) 6 - 20 mg/dL   Creatinine, Ser 1.46 (H) 0.61 - 1.24 mg/dL   Calcium 7.4 (L) 8.9 - 10.3 mg/dL   GFR calc non Af Amer 46 (L) >60 mL/min   GFR calc Af Amer 54 (L) >60 mL/min    Comment: (NOTE) The eGFR has been calculated using the CKD EPI equation. This calculation has not been validated in all clinical situations. eGFR's persistently <60 mL/min signify possible Chronic Kidney Disease.    Anion gap 8 5 - 15  Glucose, capillary     Status: Abnormal   Collection Time: 02/18/15  7:38 AM  Result Value Ref Range   Glucose-Capillary 154 (H) 65 - 99 mg/dL   Comment 1 Capillary Specimen    Comment 2 Notify RN   Glucose, capillary  Status: Abnormal   Collection Time: 02/18/15 12:11 PM  Result Value Ref Range   Glucose-Capillary 176 (H) 65 - 99 mg/dL   Comment 1 Capillary Specimen    Comment 2 Notify RN     Imaging: Imaging results have been reviewed  Assessment/Plan:   1. Principal  Problem: 2.   Acute gallstone pancreatitis 3. Active Problems: 4.   Syncope 5.   AKI (acute kidney injury) 6.   Diabetes 7.   Pancreatitis 8.   NSTEMI (non-ST elevated myocardial infarction) 9.   Cardiomyopathy 10.   Ileus 11.   Acute biliary pancreatitis 12.   Acute systolic congestive heart failure 13.   Essential hypertension 14.   CAD, multiple vessel: CTO of RCA, Cx with severe prox LAD disease 15.   Acute blood loss anemia 16.   Hemorrhagic shock 17.   Septic shock 18.   Hematemesis with nausea 19.   Erosive esophagitis 20.   HCAP (healthcare-associated pneumonia) 34.   DVT (deep venous thrombosis) 22.   Gastroesophageal reflux disease with esophagitis 23.   S/P CABG x 3 24.   Time Spent Directly with Patient:  20 minutes  Length of Stay:  LOS: 36 days   POD # 4 CABG X3. Mod LV dysfunction. VSS. On approp meds. Ambulating > Labs OK. SCr slowly improving. Exam notable for 2+ pitting edema. I/O +. On diuretic. Start ACE-I when renal fxn stab;izes. Tx tele per TCTS. Nl progression.  Quay Burow 02/18/2015, 2:23 PM

## 2015-02-18 NOTE — Progress Notes (Addendum)
4 Days Post-Op Procedure(s) (LRB): CORONARY ARTERY BYPASS GRAFTING (CABG) x three, using left internal mammary artery and left leg greater saphenous vein harvested endoscopically (N/A) TRANSESOPHAGEAL ECHOCARDIOGRAM (TEE) (N/A) Subjective:looks good  Ready to tx to stepdowm No GI complaints  Objective: Vital signs in last 24 hours: Temp:  [97.7 F (36.5 C)-99.3 F (37.4 C)] 98.3 F (36.8 C) (06/13 0741) Pulse Rate:  [74-103] 84 (06/13 0700) Cardiac Rhythm:  [-] Normal sinus rhythm (06/12 2000) Resp:  [15-30] 23 (06/13 0700) BP: (93-147)/(46-88) 137/66 mmHg (06/13 0700) SpO2:  [93 %-100 %] 97 % (06/13 0700) Weight:  [177 lb 7.5 oz (80.5 kg)] 177 lb 7.5 oz (80.5 kg) (06/13 0500)  Hemodynamic parameters for last 24 hours:  nsr  Intake/Output from previous day: 06/12 0701 - 06/13 0700 In: 412.3 [P.O.:410; I.V.:2.3] Out: 1925 [Urine:1925] Intake/Output this shift:    incisions clean Mild edema Lab Results:  Recent Labs  02/17/15 0400 02/18/15 0430  WBC 9.4 7.0  HGB 8.4* 8.5*  HCT 26.1* 26.3*  PLT 137* 159   BMET:  Recent Labs  02/17/15 0400 02/18/15 0430  NA 136 136  K 3.9 3.8  CL 101 102  CO2 27 26  GLUCOSE 82 153*  BUN 20 23*  CREATININE 1.49* 1.46*  CALCIUM 7.4* 7.4*    PT/INR: No results for input(s): LABPROT, INR in the last 72 hours. ABG    Component Value Date/Time   PHART 7.383 02/14/2015 2021   HCO3 24.0 02/14/2015 2021   TCO2 22 02/15/2015 1539   ACIDBASEDEF 1.0 02/14/2015 2021   O2SAT 98.0 02/14/2015 2021   CBG (last 3)   Recent Labs  02/17/15 1914 02/17/15 2315 02/18/15 0357  GLUCAP 196* 176* 154*    Assessment/Plan: S/P Procedure(s) (LRB): CORONARY ARTERY BYPASS GRAFTING (CABG) x three, using left internal mammary artery and left leg greater saphenous vein harvested endoscopically (N/A) TRANSESOPHAGEAL ECHOCARDIOGRAM (TEE) (N/A) Mobilize Diuresis Diabetes control Plan for transfer to step-down: see transfer orders   LOS: 36  days    [name] [name] 02/18/2015  Plan home on coumadin for preop bilat DVT

## 2015-02-18 NOTE — Progress Notes (Signed)
Transferred to 2W24 via WC and monitor. Placed in chair. RN to receive in room.

## 2015-02-18 NOTE — Progress Notes (Signed)
Utilization review completed.  

## 2015-02-19 ENCOUNTER — Telehealth: Payer: Self-pay | Admitting: Internal Medicine

## 2015-02-19 ENCOUNTER — Inpatient Hospital Stay (HOSPITAL_COMMUNITY): Payer: Medicare Other

## 2015-02-19 LAB — CBC
HCT: 30.8 % — ABNORMAL LOW (ref 39.0–52.0)
Hemoglobin: 9.7 g/dL — ABNORMAL LOW (ref 13.0–17.0)
MCH: 27.6 pg (ref 26.0–34.0)
MCHC: 31.5 g/dL (ref 30.0–36.0)
MCV: 87.7 fL (ref 78.0–100.0)
Platelets: 243 10*3/uL (ref 150–400)
RBC: 3.51 MIL/uL — ABNORMAL LOW (ref 4.22–5.81)
RDW: 14.5 % (ref 11.5–15.5)
WBC: 8.4 10*3/uL (ref 4.0–10.5)

## 2015-02-19 LAB — GLUCOSE, CAPILLARY
Glucose-Capillary: 163 mg/dL — ABNORMAL HIGH (ref 65–99)
Glucose-Capillary: 172 mg/dL — ABNORMAL HIGH (ref 65–99)
Glucose-Capillary: 164 mg/dL — ABNORMAL HIGH (ref 65–99)
Glucose-Capillary: 129 mg/dL — ABNORMAL HIGH (ref 65–99)

## 2015-02-19 LAB — PROTIME-INR
INR: 1.21 (ref 0.00–1.49)
Prothrombin Time: 15.5 seconds — ABNORMAL HIGH (ref 11.6–15.2)

## 2015-02-19 LAB — BASIC METABOLIC PANEL
Anion gap: 9 (ref 5–15)
BUN: 25 mg/dL — ABNORMAL HIGH (ref 6–20)
CO2: 23 mmol/L (ref 22–32)
Calcium: 8 mg/dL — ABNORMAL LOW (ref 8.9–10.3)
Chloride: 106 mmol/L (ref 101–111)
Creatinine, Ser: 1.35 mg/dL — ABNORMAL HIGH (ref 0.61–1.24)
GFR calc Af Amer: 59 mL/min — ABNORMAL LOW (ref >60)
GFR calc non Af Amer: 51 mL/min — ABNORMAL LOW (ref >60)
Glucose, Bld: 167 mg/dL — ABNORMAL HIGH (ref 65–99)
Potassium: 3.9 mmol/L (ref 3.5–5.1)
Sodium: 138 mmol/L (ref 135–145)

## 2015-02-19 MED ORDER — WARFARIN SODIUM 2 MG PO TABS
2.0000 mg | ORAL_TABLET | Freq: Every day | ORAL | Status: DC
  Administered 2015-02-19 – 2015-02-20 (×2): 2 mg via ORAL
  Filled 2015-02-19 (×3): qty 1

## 2015-02-19 NOTE — Progress Notes (Addendum)
      [address]       [place] [address]             [phone]      5 Days Post-Op Procedure(s) (LRB): CORONARY ARTERY BYPASS GRAFTING (CABG) x three, using left internal mammary artery and left leg greater saphenous vein harvested endoscopically (N/A) TRANSESOPHAGEAL ECHOCARDIOGRAM (TEE) (N/A)   Subjective:  Mr. [name] [name] has no complaints this morning.  He states he is doing well.  He is ambulating around the unit without difficulty.  Tolerating diet with no nausea or vomiting.  + BM  Objective: Vital signs in last 24 hours: Temp:  [97.7 F (36.5 C)-98.5 F (36.9 C)] 98.3 F (36.8 C) (06/14 0357) Pulse Rate:  [73-85] 78 (06/14 0357) Cardiac Rhythm:  [-]  Resp:  [18-28] 18 (06/14 0357) BP: (87-129)/(45-70) 124/54 mmHg (06/14 0357) SpO2:  [95 %-100 %] 95 % (06/14 0357) Weight:  [177 lb 14.6 oz (80.7 kg)] 177 lb 14.6 oz (80.7 kg) (06/14 0357)  Intake/Output from previous day: 06/13 0701 - 06/14 0700 In: 700 [P.O.:660; I.V.:40] Out: 7 [Urine:3; Stool:4]  General appearance: alert, cooperative and no distress Heart: regular rate and rhythm Lungs: clear to auscultation bilaterally Abdomen: soft, non-tender; bowel sounds normal; no masses,  no organomegaly Extremities: edema 1-2 + pitting Wound: clean and dry  Lab Results:  Recent Labs  02/18/15 0430 02/19/15 0445  WBC 7.0 8.4  HGB 8.5* 9.7*  HCT 26.3* 30.8*  PLT 159 243   BMET:  Recent Labs  02/18/15 0430 02/19/15 0445  NA 136 138  K 3.8 3.9  CL 102 106  CO2 26 23  GLUCOSE 153* 167*  BUN 23* 25*  CREATININE 1.46* 1.35*  CALCIUM 7.4* 8.0*    PT/INR:  Recent Labs  02/19/15 0445  LABPROT 15.5*  INR 1.21   ABG    Component Value Date/Time   PHART 7.383 02/14/2015 2021   HCO3 24.0 02/14/2015 2021   TCO2 22 02/15/2015 1539   ACIDBASEDEF 1.0 02/14/2015 2021   O2SAT 98.0 02/14/2015 2021   CBG (last 3)   Recent Labs  02/18/15 1610 02/18/15 2118 02/19/15 0541  GLUCAP 142*  159* 163*    Assessment/Plan: S/P Procedure(s) (LRB): CORONARY ARTERY BYPASS GRAFTING (CABG) x three, using left internal mammary artery and left leg greater saphenous vein harvested endoscopically (N/A) TRANSESOPHAGEAL ECHOCARDIOGRAM (TEE) (N/A)  1. CV- NSR, rate and pressure controlled- continue Lopressor for now, coumadin at 2 mg 2. Pulm- no acute issues, continue IS 3. Renal- creatinine trending down, currently at 1.35, remains hypervolemic with LE edema- continue lasix, will add TED hose 4. Acute post operative blood loss anemia, mild- Hgb at 9.7 5. DM- sugars controlled, continue SSIP for now 6. Dispo- patient doing very well, will d/c EPW before INR elevates, continue coumadin, likely d/c in next 24-48 hours   LOS: 37 days    BARRETT, ERIN 02/19/2015 patient examined and medical record reviewed,agree with above note. 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

## 2015-02-19 NOTE — Progress Notes (Signed)
CARDIAC REHAB PHASE I   PRE:  Rate/Rhythm: 80 SR    BP: sitting 130/60    SaO2: 98 RA  MODE:  Ambulation: 350 ft   POST:  Rate/Rhythm: 102 ST    BP: sitting 140/72     SaO2: 99 RA  Pt able to stand almost independently. Used RW, min assist. Sts he is still weak but able to increase distance without problems. To recliner. Pt has RW at home, family to check on height. Pt does have stairs at home that he would like to practice before d/c. Pt very pleased and thankful for his recovery. [phone]  Elissa Lovett [employer] CES, ACSM 02/19/2015 2:39 PM

## 2015-02-19 NOTE — Progress Notes (Signed)
Pt vomited after carafate; with each dose pt takes of carafate, pt gags continuously; daughter at bedside; pt and daughter wanting to speak with MD about possibly stopping carafate at this time; will cont. To monitor.

## 2015-02-19 NOTE — Progress Notes (Signed)
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  Increased WOB and GIB with pt transferred to ICU and intubated 5/16 to 5/18. 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 Ambulates slowly up to 200 feet today with a rolling walker for support. Some difficulty with navigating stairs, requiring min assist. Continues to require reinforcement with sternal precautions. Motivated to improve his functional ability. Patient will continue to benefit from skilled physical therapy services to further improve independence with functional mobility.   Follow Up Recommendations  Home health PT;Supervision/Assistance - 24 hour (benefit from short term HHPT due to weakness and home safety)     Equipment Recommendations  Rolling walker with 5" wheels    Recommendations for Other Services OT consult     Precautions / Restrictions Precautions Precautions: Fall;Sternal Precaution Comments: Fall risk greatly reduced with use of RW Restrictions Weight Bearing Restrictions: Yes Other Position/Activity Restrictions: sternal precautions    Mobility  Bed Mobility               General bed mobility comments: up in chair upon arrival  Transfers Overall transfer level: Needs assistance Equipment used: Rolling walker (2 wheeled) Transfers: Sit to/from Stand Sit to Stand: Min guard         General transfer comment: Close card for safety with verbal cues for hand placement. Performed from reclining chair. Cues to maintain sternal precautions. Rocks anteriorly for momentum.  Ambulation/Gait Ambulation/Gait assistance: Min  guard Ambulation Distance (Feet): 200 Feet Assistive device: Rolling walker (2 wheeled) Gait Pattern/deviations: Step-through pattern;Decreased stride length;Trunk flexed Gait velocity: slow   General Gait Details: Slow and guarded. Min guard for safety with no overt loss of balance noted during bout. HR into 90s. VC for upright posture and discussed energy conservation techniques. Good stability with RW for support, cues for light touch only due to sternal precautions   Stairs Stairs: Yes Stairs assistance: Min assist Stair Management: One rail Right;One rail Left;Sideways;Step to pattern Number of Stairs: 3 (x2) General stair comments: Educated on stair navigation techniques. Attempted using rail on right with side-step however LLE appears to be weaker than Rt and pt required min assist for balance when leading with LLE. Min guard when using Left rail leading with RLE for sideways approach.  Wheelchair Mobility    Modified Rankin (Stroke Patients Only)       Balance                                    Cognition Arousal/Alertness: Awake/alert Behavior During Therapy: WFL for tasks assessed/performed Overall Cognitive Status: Within Functional Limits for tasks assessed       Memory: Decreased recall of precautions              Exercises General Exercises - Lower Extremity Ankle Circles/Pumps: AROM;Both;10 reps;Seated Quad Sets: AROM;Both;10 reps;Seated Gluteal Sets: Strengthening;Both;10 reps;Seated Long Arc Quad: Strengthening;Both;10 reps;Seated Hip Flexion/Marching: Strengthening;Both;10 reps;Seated    General Comments General comments (skin integrity, edema, etc.): Bilateral lower extremity edema.Legs left Elevated.      Pertinent Vitals/Pain Pain Assessment: No/denies pain Pain  Intervention(s): Monitored during session    Home Living                      Prior Function            PT Goals (current goals can now be found in the  care plan section) Acute Rehab PT Goals Patient Stated Goal: to get better PT Goal Formulation: With patient/family Time For Goal Achievement: 03/03/15 Potential to Achieve Goals: Good Progress towards PT goals: Progressing toward goals    Frequency  Min 3X/week    PT Plan Discharge plan needs to be updated    Co-evaluation             End of Session Equipment Utilized During Treatment: Gait belt Activity Tolerance: Patient tolerated treatment well Patient left: in chair;with call bell/phone within reach;with family/visitor present     Time: 3005-1102 PT Time Calculation (min) (ACUTE ONLY): 19 min  Charges:  $Gait Training: 8-22 mins                    G Codes:      Berton Mount 03-10-15, 4:56 PM Sunday [place], [place] [phone]

## 2015-02-19 NOTE — Discharge Instructions (Signed)
Acute Pancreatitis Acute pancreatitis is a disease in which the pancreas becomes suddenly irritated (inflamed). The pancreas is a large gland behind your stomach. The pancreas makes enzymes that help digest food. The pancreas also makes 2 hormones that help control your blood sugar. Acute pancreatitis happens when the enzymes attack and damage the pancreas. Most attacks last a couple of days and can cause serious problems. HOME CARE  Follow your doctor's diet instructions. You may need to avoid alcohol and limit fat in your diet.  Eat small meals often.  Drink enough fluids to keep your pee (urine) clear or pale yellow.  Only take medicines as told by your doctor.  Avoid drinking alcohol if it caused your disease.  Do not smoke.  Get plenty of rest.  Check your blood sugar at home as told by your doctor.  Keep all doctor visits as told. GET HELP IF:  You do not get better as quickly as expected.  You have new or worsening symptoms.  You have lasting pain, weakness, or feel sick to your stomach (nauseous).  You get better and then have another pain attack. GET HELP RIGHT AWAY IF:   You are unable to eat or keep fluids down.  Your pain becomes severe.  You have a fever or lasting symptoms for more than 2 to 3 days.  You have a fever and your symptoms suddenly get worse.  Your skin or the white part of your eyes turn yellow (jaundice).  You throw up (vomit).  You feel dizzy, or you pass out (faint).  Your blood sugar is high (over 300 mg/dL). MAKE SURE YOU:   Understand these instructions.  Will watch your condition.  Will get help right away if you are not doing well or get worse. Document Released: 02/10/2008 Document Revised: 01/08/2014 Document Reviewed: 12/03/2011 [facility] Patient Information 2015 [place], [place]. This information is not intended to replace advice given to you by your health care provider. Make sure you discuss any questions you have with  your health care provider. Endoscopic Saphenous Vein Harvesting Care After Refer to this sheet in the next few weeks. These instructions provide you with information on caring for yourself after your procedure. Your health care provider may also give you more specific instructions. Your treatment has been planned according to current medical practices, but problems sometimes occur. Call your health care provider if you have any problems or questions after your procedure. HOME CARE INSTRUCTIONS Medicine  Take whatever pain medicine your surgeon prescribes. Follow the directions carefully. Do not take over-the-counter pain medicine unless your surgeon says it is okay. Some pain medicine can cause bleeding problems for several weeks after surgery.  Follow your surgeon's instructions about driving. You will probably not be permitted to drive after heart surgery.  Take any medicines your surgeon prescribes. Any medicines you took before your heart surgery should be checked with your health care provider before you start taking them again. Wound care  If your surgeon has prescribed an elastic bandage or stocking, ask how long you should wear it.  Check the area around your surgical cuts (incisions) whenever your bandages (dressings) are changed. Look for any redness or swelling.  You will need to return to have the stitches (sutures) or staples taken out. Ask your surgeon when to do that.  Ask your surgeon when you can shower or bathe. Activity  Try to keep your legs raised when you are sitting.  Do any exercises your health care providers have  given you. These may include deep breathing exercises, coughing, walking, or other exercises. SEEK MEDICAL CARE IF:  You have any questions about your medicines.  You have more leg pain, especially if your pain medicine stops working.  New or growing bruises develop on your leg.  Your leg swells, feels tight, or becomes red.  You have numbness in  your leg. SEEK IMMEDIATE MEDICAL CARE IF:  Your pain gets much worse.  Blood or fluid leaks from any of the incisions.  Your incisions become warm, swollen, or red.  You have chest pain.  You have trouble breathing.  You have a fever.  You have more pain near your leg incision. MAKE SURE YOU:  Understand these instructions.  Will watch your condition.  Will get help right away if you are not doing well or get worse. Document Released: 05/06/2011 Document Revised: 08/29/2013 Document Reviewed: 05/06/2011 [facility] Patient Information 2015 [place], [place]. This information is not intended to replace advice given to you by your health care provider. Make sure you discuss any questions you have with your health care provider. Coronary Artery Bypass Grafting, Care After These instructions give you information on caring for yourself after your procedure. Your doctor may also give you more specific instructions. Call your doctor if you have any problems or questions after your procedure.  HOME CARE  Only take medicine as told by your doctor. Take medicines exactly as told. Do not stop taking medicines or start any new medicines without talking to your doctor first.  Take your pulse as told by your doctor.  Do deep breathing as told by your doctor. Use your breathing device (incentive spirometer), if given, to practice deep breathing several times a day. Support your chest with a pillow or your arms when you take deep breaths or cough.  Keep the area clean, dry, and protected where the surgery cuts (incisions) were made. Remove bandages (dressings) only as told by your doctor. If strips were applied to surgical area, do not take them off. They fall off on their own.  Check the surgery area daily for puffiness (swelling), redness, or leaking fluid.  If surgery cuts were made in your legs:  Avoid crossing your legs.  Avoid sitting for long periods of time. Change positions every 30  minutes.  Raise your legs when you are sitting. Place them on pillows.  Wear stockings that help keep blood clots from forming in your legs (compression stockings).  Only take sponge baths until your doctor says it is okay to take showers. Pat the surgery area dry. Do not rub the surgery area with a washcloth or towel. Do not bathe, swim, or use a hot tub until your doctor says it is okay.  Eat foods that are high in fiber. These include raw fruits and vegetables, whole grains, beans, and nuts. Choose lean meats. Avoid canned, processed, and fried foods.  Drink enough fluids to keep your pee (urine) clear or pale yellow.  Weigh yourself every day.  Rest and limit activity as told by your doctor. You may be told to:  Stop any activity if you have chest pain, shortness of breath, changes in heartbeat, or dizziness. Get help right away if this happens.  Move around often for short amounts of time or take short walks as told by your doctor. Gradually become more active. You may need help to strengthen your muscles and build endurance.  Avoid lifting, pushing, or pulling anything heavier than 10 pounds (4.5 kg) for  at least 6 weeks after surgery.  Do not drive until your doctor says it is okay.  Ask your doctor when you can go back to work.  Ask your doctor when you can begin sexual activity again.  Follow up with your doctor as told. GET HELP IF:  You have puffiness, redness, more pain, or fluid draining from the incision site.  You have a fever.  You have puffiness in your ankles or legs.  You have pain in your legs.  You gain 2 or more pounds (0.9 kg) a day.  You feel sick to your stomach (nauseous) or throw up (vomit).  You have watery poop (diarrhea). GET HELP RIGHT AWAY IF:  You have chest pain that goes to your jaw or arms.  You have shortness of breath.  You have a fast or irregular heartbeat.  You notice a "clicking" in your breastbone when you move.  You  have numbness or weakness in your arms or legs.  You feel dizzy or light-headed. MAKE SURE YOU:  Understand these instructions.  Will watch your condition.  Will get help right away if you are not doing well or get worse. Document Released: 08/29/2013 Document Reviewed: 08/29/2013 [facility] Patient Information 2015 [place], [place]. This information is not intended to replace advice given to you by your health care provider. Make sure you discuss any questions you have with your health care provider.

## 2015-02-19 NOTE — Care Management Note (Addendum)
Case Management Note  note started by Raynald Blend RNCM  Patient Details  Name: [name] [name] MRN: [id number] Date of Birth: 07/30/1941  Subjective/Objective:  Pt intially admitted for gallstone pancreatitis.       Action/Plan:  Pt has CABG scheduled for 02/14/15.  CM will continue to monitor for disposition needs   Expected Discharge Date:       02/21/15           Expected Discharge Plan:  Home w Home Health Services  In-House Referral:  Clinical Social Work  Discharge planning Services  CM Consult  Post Acute Care Choice:  Home Health Choice offered to:  Patient, Spouse  DME Arranged:    DME Agency:     HH Arranged:    HH Agency:  [facility]  Status of Service:  In process, will continue to follow  Medicare Important Message Given:  Yes Date Medicare IM Given:  02/05/15 Medicare IM give by:  Raynald Blend Date Additional Medicare IM Given:  02/19/15 Additional Medicare Important Message give by:  Donn Pierini RN, BSN   If discussed at Long Length of Stay Meetings, dates discussed:  02/14/15, 02/19/15  Additional Comments: 02/19/15- referral received for [facility] needs RN/PT and INR draws- spoke with pt and family at bedside- per conversation they are agreeable to [facility] services as pt is deconditioned from lengthy hospital stay and will not be starting cardiac rehab just yet. Pt has needed DME equipment already at home that includes RW and BSC. Provided list for [facility] agencies - per pt choice they would like to use [facility] for [facility] services- will need orders for HH-RN/PT with F2F and if any INR draws needed- will need instructions for that. Have called Clydie Braun with [facility] to alert of HH needs for discharge and will await HH orders.   Donn Pierini [place], [place]- [phone] 02/19/2015, 3:08 PM

## 2015-02-19 NOTE — Progress Notes (Signed)
Pt ambulated 150 feet with rolling walker; steady gait noted; pt denies pain; pt to bed after walk in preparation for EPW removal; will cont. To monitor.

## 2015-02-19 NOTE — Progress Notes (Signed)
EPW d/c at this time; pt tolerated well; VSS; bedrest until 1135; will cont. To monitor.

## 2015-02-19 NOTE — Discharge Summary (Addendum)
Physician Discharge Summary  Patient ID: [name] [name] MRN: [id number] DOB/AGE: 1941/07/26 74 y.o.  Admit date: 01/13/2015 Discharge date: 02/19/2015  Admission Diagnoses: Acute gallstone pancreatitis                                         NSTEMI Discharge Diagnoses:  Principal Problem:   Acute gallstone pancreatitis Active Problems:   Syncope   AKI (acute kidney injury)   Diabetes   Pancreatitis   NSTEMI (non-ST elevated myocardial infarction)   Cardiomyopathy   Ileus   Acute biliary pancreatitis   Acute systolic congestive heart failure   Essential hypertension   CAD, multiple vessel: CTO of RCA, Cx with severe prox LAD disease   Acute blood loss anemia   Hemorrhagic shock   Septic shock   Hematemesis with nausea   Erosive esophagitis   HCAP (healthcare-associated pneumonia)   DVT (deep venous thrombosis)   Gastroesophageal reflux disease with esophagitis   S/P CABG x 3  Patient Active Problem List   Diagnosis Date Noted  . 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 Gastroesophageal reflux disease with esophagitis   . Aspiration pneumonia   . Hypokalemia 02/04/2015  . Ischemic cardiomyopathy   . Cardiomyopathy, ischemic   . DVT (deep venous thrombosis)   . Demand ischemia of myocardium 01/22/2015  . Erosive esophagitis   . HCAP (healthcare-associated pneumonia)   . Shock   . Acute blood loss anemia 01/21/2015  . Hemorrhagic shock 01/21/2015  . Acute respiratory failure with hypoxemia   . Septic shock   . Ulcerative esophagitis   . Hematemesis with nausea   . Melena   . CAD, multiple vessel: CTO of RCA, Cx with severe prox LAD disease     Class: Diagnosis of  . Calculus of gallbladder with acute cholecystitis without obstruction   . Cholecystitis, acute   . Metabolic acidosis   . Acute systolic CHF (congestive heart failure)   . Acute renal failure syndrome   . NSTEMI (non-ST elevated myocardial infarction) 01/15/2015  . Cardiomyopathy 01/15/2015  . Choledocholithiasis  01/15/2015  . Hypocalcemia 01/15/2015  . Ileus 01/15/2015  . Acute biliary pancreatitis   . Leukocytosis   . Syncope and collapse   . Acute systolic congestive heart failure   . Essential hypertension   . Other specified hypotension   . Acute kidney injury   . Diabetes type 2, uncontrolled   . Pancreatitis 01/14/2015  . Abdominal pain, epigastric 01/13/2015  . Nausea with vomiting 01/13/2015  . Acute gallstone pancreatitis 01/13/2015  . Elevated LFTs 01/13/2015  . Gallstones 01/13/2015  . Syncope 01/13/2015  . Hypotension 01/13/2015  . Bradycardia 01/13/2015  . AKI (acute kidney injury) 01/13/2015  . Diabetes 01/13/2015    History of the present illness:  The patient is a 74 year old male who presented to the emergency department after a syncopal episode in Sunday school. The patient reports since before [facility] he hadn't persisted epigastric abdominal pain with associated nausea. The pain persisted and intensified to a level of 8/10 when he then passed out. EMS was called and he was found to be bradycardic with heart rate in the thirties and systolic blood pressure in the mid sixties. He was taken to the emergency department at [facility] for further evaluation and treatment. In the emergency department he was found on CT scan to have a common bile duct stone with elevated lipase  and CT scan findings consistent with pancreatitis. He was transferred to [facility] for further evaluation and treatment. He was also noted in the emergency department that he had an elevated troponin of 2.44.  Discharged Condition: good  Hospital Course: The patient was admitted under the gastroenterology service for further evaluation and management. Additionally, cardiology consultation was obtained. He did rule in for non-STEMI myocardial infarction. He developed some acute renal insufficiency and thrombocytopenia. It was felt he would require cardiac catheterization as well as eventual ERCP  when more medically stable. He had findings also of acute systolic congestive heart failure. He did have a MRCP  on 01/15/2015 which confirmed findings of acute pancreatitis with no organized fluid collections. On 01/17/2015 he was felt to be medically stabilized enough to proceed with cardiac catheterization. This showed significant three-vessel coronary artery disease and cardiothoracic surgical consultation was obtained with Kerin Perna MD who did feel he would require eventual surgical revascularization however was not deemed to be medically stable due to the multiple acute issues and comorbidities. He was continued on aggressive gastroenterology management. He developed a worsening ileus. He also developed acute upper GI bleeding with hypotension requiring emergent EGD as well as intubation for associated respiratory failure. He also developed presumed hospital aquired pneumonia.  He was found to have grade 4 esophagitis with deep ulcers, eschar, and friability throughout. No significant active bleeding or focal vascular lesion. There is blood clots that obscured a small portion of the stomach but otherwise it was normal. The duodenum revealed moderate edema in the second portion consistent with pancreatitis. He did also require transfusion for symptomatic anemia. He was also found during hospitalization to have bilateral DVT in the lower extremities and an IVC filter was placed. He did have a repeat EGD done on 02/05/2015 2 reevaluate the esophagus for consideration of planning long-term anticoagulation. He was also placed on a significant anti-reflux and and tested regimen.  Ultimately he did stabilize enough for proceeding with cardiac surgery.  Consults: cardiology, pulmonary/intensive care, GI and general surgery  Significant Diagnostic Studies: radiology: CT scan: abdomen, endoscopy: upper and angiography: cardiac cath   Treatments: surgery:  DATE OF PROCEDURE: 02/14/2015 DATE OF  DISCHARGE:   OPERATIVE REPORT   OPERATIONS: 1. Coronary artery bypass grafting x3 (left internal mammary artery to  left anterior descending coronary artery, saphenous vein graft to  diagonal, saphenous vein graft to obtuse marginal). 2. Bilateral lower extremity endoscopic saphenous vein harvest.  SURGEON: Kerin Perna, M.D.  ASSISTANT: Denny Peon Barrett, PA-C  PREOPERATIVE DIAGNOSES: Severe 3-vessel coronary artery disease, moderate left ventricular dysfunction, recent non-ST elevation myocardial infarction.  POSTOPERATIVE DIAGNOSES: Severe 3-vessel coronary artery disease, moderate left ventricular dysfunction, recent non-ST elevation myocardial infarction.  ANESTHESIA: General by Dr. Corky Sox.  Post operative hospital course: The patient has overall progressed nicely. He initially did require some low-dose inotropic support which was weaned without difficulty. He was weaned from the ventilator without significant difficulty. His renal function has been stable. His diabetes has been under control. He has had some volume overload which has responded well to direct. He has had no significant postoperative cardiac dysrhythmias. Incisions are noted be healing well without evidence of infection. He is tolerating gradually increasing activities using standard protocols. He is continued on Coumadin due to his preoperative bilateral DVT. Currently he is felt to be tentatively stable for discharge in the next 24-48 hours pending ongoing reevaluation of his recovery.   Discharge Exam: Blood pressure 124/54, pulse 78,  temperature 98.3 F (36.8 C), temperature source Oral, resp. rate 18, height 6' (1.829 m), weight 177 lb 14.6 oz (80.7 kg), SpO2 95 %.   General appearance: alert, cooperative and no distress Heart: regular rate and rhythm and occ extrasystole Lungs: clear to auscultation bilaterally Abdomen: benign Extremities: minor edema   Wound: incis healing well  Disposition: Final discharge disposition not confirmed  Follow-up Information    Follow up with Mikey Bussing, MD.   Specialty:  Cardiothoracic Surgery   Why:  July 13th a 3::30 pm to see the heart surgeon. Please obtain a chest xray at [facility] at 3:00 pm. It is located in the same complex as [employer], [employer]/product.    Contact information:   [address] [employer] [address] [place] [address] [phone]       Follow up with [facility].   Contact information:   [address] [place] [address] [phone]       Follow up with [facility].   Why:  they will arrange for cardiology follow up in [place] office. also have blood test tomorrow at [facility] office at 8:30 am to adjust coumadin dose   Contact information:   [address] [place] [address]        Medication List    STOP taking these medications        lisinopril-hydrochlorothiazide 20-12.5 MG per tablet  Commonly known as:  PRINZIDE,ZESTORETIC     sildenafil 100 MG tablet  Commonly known as:  VIAGRA     vardenafil 20 MG tablet  Commonly known as:  LEVITRA      TAKE these medications        aspirin 81 MG EC tablet  Take 1 tablet (81 mg total) by mouth daily.     atorvastatin 40 MG tablet  Commonly known as:  LIPITOR  Take 1 tablet (40 mg total) by mouth daily at 6 PM.     ferrous fumarate-b12-vitamic C-folic acid capsule  Commonly known as:  TRINSICON / FOLTRIN  Take 1 capsule by mouth 3 (three) times daily after meals.     furosemide 40 MG tablet  Commonly known as:  LASIX  Take 1 tablet (40 mg total) by mouth daily.     glipiZIDE 10 MG 24 hr tablet  Commonly known as:  GLUCOTROL XL  Take 10 mg by mouth 2 (two) times daily.     latanoprost 0.005 % ophthalmic solution  Commonly known as:  XALATAN  Place 1 drop into both eyes at bedtime.     lisinopril 10 MG tablet  Commonly known as:  PRINIVIL,ZESTRIL   Take 1 tablet (10 mg total) by mouth daily.     metoprolol tartrate 25 MG tablet  Commonly known as:  LOPRESSOR  Take 0.5 tablets (12.5 mg total) by mouth 2 (two) times daily.     oxyCODONE 5 MG immediate release tablet  Commonly known as:  Oxy IR/ROXICODONE  Take 1 tablet (5 mg total) by mouth every 4 (four) hours as needed for severe pain.     pantoprazole 40 MG tablet  Commonly known as:  PROTONIX  Take 1 tablet (40 mg total) by mouth 2 (two) times daily.     potassium chloride SA 20 MEQ tablet  Commonly known as:  K-DUR,KLOR-CON  Take 1 tablet (20 mEq total) by mouth daily.     saccharomyces boulardii 250 MG capsule  Commonly known as:  FLORASTOR  Take 1 capsule (250 mg total) by mouth 2 (two) times daily.     sitaGLIPtin 100 MG tablet  Commonly known as:  JANUVIA  Take 100 mg by mouth daily.     warfarin 2.5 MG tablet  Commonly known as:  COUMADIN  Take 1 tablet (2.5 mg total) by mouth daily at 6 PM.       The patient has been discharged on:   1.Beta Blocker:  Yes [ y  ]                              No   [   ]                              If No, reason:  2.Ace Inhibitor/ARB: Yes [  y ]                                     No  [    ]                                     If No, reason:  3.Statin:   Yes [ y  ]                  No  [   ]                  If No, reason:  4.Ecasa:  Yes  [  y ]                  No   [   ]                  If No, reason:  Signed: Carnie Bruemmer E 02/19/2015, 9:21 AM  Will DC home on lisinopril 10 mg because of MI and mod LV dysfunction patient examined and medical record reviewed,agree with above note. Kathlee Nations Trigt III 02/21/2015

## 2015-02-20 LAB — PROTIME-INR
INR: 1.18 (ref 0.00–1.49)
Prothrombin Time: 15.2 seconds (ref 11.6–15.2)

## 2015-02-20 LAB — GLUCOSE, CAPILLARY
GLUCOSE-CAPILLARY: 156 mg/dL — AB (ref 65–99)
GLUCOSE-CAPILLARY: 176 mg/dL — AB (ref 65–99)
Glucose-Capillary: 226 mg/dL — ABNORMAL HIGH (ref 65–99)
Glucose-Capillary: 128 mg/dL — ABNORMAL HIGH (ref 65–99)

## 2015-02-20 MED ORDER — PANTOPRAZOLE SODIUM 40 MG PO TBEC
40.0000 mg | DELAYED_RELEASE_TABLET | Freq: Two times a day (BID) | ORAL | Status: DC
  Administered 2015-02-20 – 2015-02-21 (×2): 40 mg via ORAL
  Filled 2015-02-20 (×2): qty 1

## 2015-02-20 NOTE — Progress Notes (Signed)
Nutrition Follow-up  DOCUMENTATION CODES:  Not applicable  INTERVENTION:   Ensure Enlive po TID, each supplement provides 350 kcal and 20 grams of protein   Prostat liquid protein po 30 ml BID with meals, each supplement provides 100 kcal, 15 grams protein  NUTRITION DIAGNOSIS:  Inadequate oral intake related to  (limited appetite) as evidenced by meal completion < 50%, ongoing  GOAL:  Patient will meet greater than or equal to 90% of their needs, met  MONITOR:  PO intake, Supplement acceptance, Labs, Weight trends, Skin, I & O's  ASSESSMENT: Admitted 5/8 after a syncopal episode and found to have acute pancreatitis. Also positive troponin/NSTEMI  Transferred to ICU with acute respiratory distress and intubated. EDG shows grade 4 esophagitis with deep ulcers, eschar, friability. OGT left out. No plans for feeding tube.   Patient s/p CABG 6/10.  Patient continues on a Carbohydrate Modified diet. PO intake variable at 10-25% per flowsheet records. Receiving Ensure Enlive oral nutrition supplements.  Plan is for likely discharge tomorrow AM.  Height:  Ht Readings from Last 1 Encounters:  02/14/15 6' (1.829 m)    Weight:  Wt Readings from Last 1 Encounters:  02/20/15 172 lb (78.019 kg)    Ideal Body Weight:  80.9 kg  Wt Readings from Last 10 Encounters:  02/20/15 172 lb (78.019 kg)    BMI:  Body mass index is 23.32 kg/(m^2).  Estimated Nutritional Needs:  Kcal:  2050-2250  Protein:  100-110 grams  Fluid:  2.0-2.2 L  Skin:  Reviewed, no issues  Diet Order:  Diet heart healthy/carb modified Room service appropriate?: Yes; Fluid consistency:: Thin  EDUCATION NEEDS:  No education needs identified at this time   Intake/Output Summary (Last 24 hours) at 02/20/15 1616 Last data filed at 02/20/15 1607  Gross per 24 hour  Intake    600 ml  Output    400 ml  Net    200 ml    Last BM:  6/15  Arthur Holms, RD, LDN Pager #:  [phone] After-Hours Pager #: [phone]

## 2015-02-20 NOTE — Progress Notes (Signed)
[address]       [place] [address]             [phone]      6 Days Post-Op Procedure(s) (LRB): CORONARY ARTERY BYPASS GRAFTING (CABG) x three, using left internal mammary artery and left leg greater saphenous vein harvested endoscopically (N/A) TRANSESOPHAGEAL ECHOCARDIOGRAM (TEE) (N/A) Subjective: conts to feel well, no new complaints  Objective: Vital signs in last 24 hours: Temp:  [98.7 F (37.1 C)-99.2 F (37.3 C)] 98.7 F (37.1 C) (06/15 0457) Pulse Rate:  [80-89] 83 (06/15 0457) Cardiac Rhythm:  [-] Normal sinus rhythm (06/15 0805) Resp:  [18] 18 (06/15 0457) BP: (114-145)/(52-67) 114/60 mmHg (06/15 0457) SpO2:  [93 %-96 %] 96 % (06/15 0457) Weight:  [172 lb (78.019 kg)] 172 lb (78.019 kg) (06/15 0400)  Hemodynamic parameters for last 24 hours:    Intake/Output from previous day: 06/14 0701 - 06/15 0700 In: 360 [P.O.:360] Out: 1 [Urine:1] Intake/Output this shift:    General appearance: alert, cooperative and no distress Heart: regular rate and rhythm and occas extrasystole Lungs: mildly dim in bases Abdomen: mild distension, soft, non-tender Extremities: + LE edema Wound: incis healing well  Lab Results:  Recent Labs  02/18/15 0430 02/19/15 0445  WBC 7.0 8.4  HGB 8.5* 9.7*  HCT 26.3* 30.8*  PLT 159 243   BMET:  Recent Labs  02/18/15 0430 02/19/15 0445  NA 136 138  K 3.8 3.9  CL 102 106  CO2 26 23  GLUCOSE 153* 167*  BUN 23* 25*  CREATININE 1.46* 1.35*  CALCIUM 7.4* 8.0*    PT/INR:  Recent Labs  02/20/15 0405  LABPROT 15.2  INR 1.18   ABG    Component Value Date/Time   PHART 7.383 02/14/2015 2021   HCO3 24.0 02/14/2015 2021   TCO2 22 02/15/2015 1539   ACIDBASEDEF 1.0 02/14/2015 2021   O2SAT 98.0 02/14/2015 2021   CBG (last 3)   Recent Labs  02/19/15 1634 02/19/15 2044 02/20/15 0553  GLUCAP 164* 129* 156*    Meds Scheduled Meds: . aspirin EC  81 mg Oral Daily  . atorvastatin  40 mg  Oral q1800  . bisacodyl  10 mg Oral Daily   Or  . bisacodyl  10 mg Rectal Daily  . docusate sodium  200 mg Oral Daily  . enoxaparin (LOVENOX) injection  30 mg Subcutaneous QHS  . feeding supplement (ENSURE ENLIVE)  237 mL Oral TID WC  . ferrous fumarate-b12-vitamic C-folic acid  1 capsule Oral TID PC  . furosemide  40 mg Oral Daily  . insulin aspart  0-24 Units Subcutaneous TID AC & HS  . latanoprost  1 drop Both Eyes QHS  . metoprolol tartrate  12.5 mg Oral BID   Or  . metoprolol tartrate  12.5 mg Per Tube BID  . pantoprazole  40 mg Oral Daily  . potassium chloride  20 mEq Oral Daily  . saccharomyces boulardii  250 mg Oral BID  . sodium chloride  3 mL Intravenous Q12H  . sodium chloride  3 mL Intravenous Q12H  . sucralfate  1 g Oral 4 times per day  . warfarin  2 mg Oral q1800  . Warfarin - Physician Dosing Inpatient   Does not apply q1800   Continuous Infusions: . sodium chloride     PRN Meds:.sodium chloride, magnesium hydroxide, metoprolol, ondansetron (ZOFRAN) IV, oxyCODONE, sodium chloride, sodium chloride, traMADol  Xrays Dg Chest 2 View  02/19/2015   CLINICAL DATA:  CABG.  EXAM: CHEST  2 VIEW  COMPARISON:  02/18/2015.  FINDINGS: Interval removal of right IJ sheath. Prior CABG. Heart size normal. Interim improvement of bibasilar atelectasis and/or infiltrates. Small bilateral pleural effusions. No pneumothorax. Aspirated barium again noted both lung bases. No acute bony abnormality.  IMPRESSION: 1. Interim removal of right IJ sheath. 2. Interim improvement of bibasilar atelectasis and/or infiltrates. Persistent small pleural effusions. 3. Prior CABG .  Cardiomegaly.  Normal pulmonary vascularity.   Electronically Signed   By: Maisie Fus  Register   On: 02/19/2015 07:59    Assessment/Plan: S/P Procedure(s) (LRB): CORONARY ARTERY BYPASS GRAFTING (CABG) x three, using left internal mammary artery and left leg greater saphenous vein harvested endoscopically (N/A) TRANSESOPHAGEAL  ECHOCARDIOGRAM (TEE) (N/A)  1 He is doing well overall, 2 cont diuresis 3 cont coumadin 4 no new labs 5 sugars controlled 6 plan for home in am if no new issues   LOS: 38 days    Chevelle Durr E 02/20/2015

## 2015-02-20 NOTE — Telephone Encounter (Signed)
Left a message for patient's daughter to call nurses at hospital to discuss patient's medications at this time.

## 2015-02-20 NOTE — Progress Notes (Signed)
Physical Therapy Treatment Patient Details Name: [name] [name] MRN: [id number] DOB: October 13, 1940 Today's Date: 02/20/2015    History of Present Illness Admitted with epigastric pain significant - pancreatitis; also with NSTEMI; Cardiac cath revealed total RCA, total prox cfx, severe prox LAD, 50% dz of dominant diagonal.  Increased WOB and GIB with pt transferred to ICU and intubated 5/16 to 5/18. 5/19 bil LE DVTs; s/p IVC; underwent CABG x 3 02/14/15    PT Comments    Completed stair training at a min guard assist level for safety. Increased weakness today while rising from chair requiring min assist. Ambulates safely with RW for support, HR 98-105, no chest pain or dyspnea. Will continue and progress until d/c.  Follow Up Recommendations  Home health PT;Supervision/Assistance - 24 hour (benefit from short term HHPT due to weakness and home safety)     Equipment Recommendations  Rolling walker with 5" wheels    Recommendations for Other Services OT consult     Precautions / Restrictions Precautions Precautions: Fall;Sternal Precaution Comments: Fall risk greatly reduced with use of RW Restrictions Weight Bearing Restrictions: Yes Other Position/Activity Restrictions: sternal precautions    Mobility  Bed Mobility               General bed mobility comments: up in chair upon arrival  Transfers Overall transfer level: Needs assistance Equipment used: Rolling walker (2 wheeled) Transfers: Sit to/from Stand Sit to Stand: Min assist         General transfer comment: Min assist for boost from chair today. Reports feeling a bit weaker. Cues for hand placement gently in lap to maintain sternal precautions.  Ambulation/Gait Ambulation/Gait assistance: Supervision Ambulation Distance (Feet): 200 Feet Assistive device: Rolling walker (2 wheeled) Gait Pattern/deviations: Step-through pattern;Decreased stride length;Trunk flexed Gait velocity: slow   General Gait Details: VC  for upright posture and forward gaze. Good control of RW today. HR to 105. No loss of balance.    Stairs Stairs: Yes Stairs assistance: Min guard Stair Management: One rail Left;Step to pattern;Sideways;Backwards Number of Stairs: 3 (1 step backwards) General stair comments: Trialed backwards approach with 1 step, however did not feel pt could safely maintain sternal precautions if he was to attempt additional steps. Able to navigate stairs with sideways approach rail on Lt with close min guard assist. RLE notably weak when stepping up but did not buckle.  Wheelchair Mobility    Modified Rankin (Stroke Patients Only)       Balance                                    Cognition Arousal/Alertness: Awake/alert Behavior During Therapy: WFL for tasks assessed/performed Overall Cognitive Status: Within Functional Limits for tasks assessed       Memory: Decreased recall of precautions              Exercises General Exercises - Lower Extremity Ankle Circles/Pumps: AROM;Both;10 reps;Seated Quad Sets: AROM;Both;10 reps;Seated Gluteal Sets: Strengthening;Both;Seated;5 reps Long Arc Quad: Strengthening;Both;10 reps;Seated Hip Flexion/Marching: Strengthening;Both;10 reps;Seated    General Comments General comments (skin integrity, edema, etc.): HR 98-105. Could not obtain accurate reading of SpO2 from pulse ox. No SOB noted. pt reported he feels a bit weak as he recently ambualted with RN in hallway.      Pertinent Vitals/Pain Pain Assessment: No/denies pain Pain Intervention(s): Monitored during session    Home Living  Prior Function            PT Goals (current goals can now be found in the care plan section) Acute Rehab PT Goals Patient Stated Goal: to get better PT Goal Formulation: With patient/family Time For Goal Achievement: 03/03/15 Potential to Achieve Goals: Good Progress towards PT goals: Progressing toward  goals    Frequency  Min 3X/week    PT Plan Current plan remains appropriate    Co-evaluation             End of Session Equipment Utilized During Treatment: Gait belt Activity Tolerance: Patient tolerated treatment well Patient left: in chair;with call bell/phone within reach;with family/visitor present     Time: 1610-9604 PT Time Calculation (min) (ACUTE ONLY): 15 min  Charges:  $Gait Training: 8-22 mins                    G Codes:      Berton Mount 23-Feb-2015, 5:58 PM [place], [place] [phone]

## 2015-02-20 NOTE — Progress Notes (Signed)
CARDIAC REHAB PHASE I   PRE:  Rate/Rhythm: 86 SR    BP: sitting 124/64    SaO2: 95 RA  MODE:  Ambulation: 500 ft   POST:  Rate/Rhythm: 113 ST    BP: sitting 130/70     SaO2: 100 RA  Tolerated well, able to increase distance with encouragement. Steady with RW. HR elevated somewhat. Pt interested in cRPII and will send referral to [place]. [phone]   Elissa Lovett [place] CES, ACSM 02/20/2015 11:24 AM

## 2015-02-20 NOTE — Progress Notes (Signed)
Daily Rounding Note  02/20/2015, 10:24 AM  LOS: 38 days   SUBJECTIVE:       Dr  Donata Clay request that GI scrutinize pt's med list as pt's family concerned by the number of meds and wondering if he still needs all the GI meds started during this admission Initially admitted with biliary pancreatitis, subsequent MRCP and CT showed non-obstructing CBD stone, pancreatitis.  Ileus early on has resolved. S/p EGDs x 2 for hemorrhagic esophagitis with CG emesis and transfusion requiring ABL anemia.   02/06/15 esophagram: terminated due to aspiration.  S/p IVC filter for DVT Non STEMI, severe 3 vsl CAD and now s/p 02/14/15 3 vsl CABG.  Intraop Echo with EF 35 to 40%.  Restarted on Warfarin 6/13. Coags not yet therapeutic.  Hgb stable but remains depressed in range of 8.5 to 9.7.  Started on Trinsicon 6/13.   Has not had FOBT testing since it was positive on 5/15.   Current GI meds are once daily Protonix, daily oral or PR Dulcolax, daily Colace, Trinsicon iron/b12/vitamin C supplement, BID Florastor, QID Carafate suspension. Has Oxycodone available but not using this for several days.   Had diarrhea, C diff negative x 2, it resolved after Ensure supplement was discontinued.  Continues with intermittent dysphagia, coughs with some of his po's, mostly when taking meds; but has been taking meds with applesauce and that helps. No abdominal pain.  Appetite still depressed.  No nausea.    OBJECTIVE:         Vital signs in last 24 hours:    Temp:  [98.7 F (37.1 C)-99.2 F (37.3 C)] 98.7 F (37.1 C) (06/15 0457) Pulse Rate:  [80-89] 83 (06/15 0457) Resp:  [18] 18 (06/15 0457) BP: (114-145)/(52-67) 114/60 mmHg (06/15 0457) SpO2:  [93 %-96 %] 96 % (06/15 0457) Weight:  [172 lb (78.019 kg)] 172 lb (78.019 kg) (06/15 0400) Last BM Date: 02/20/15 Filed Weights   02/18/15 0500 02/19/15 0357 02/20/15 0400  Weight: 177 lb 7.5 oz (80.5 kg) 177 lb  14.6 oz (80.7 kg) 172 lb (78.019 kg)   General: pale, weak, comfortable.  Looks unwell.    Heart:  Sinus rhythm with PVCs on monitor. Chest: diminished at both bases.  No cough.  No dyspnea at rest but some with prolonged speech. Abdomen: soft, non-tender, golf to lacrosse ball sized mass palpable in RUQ. Rectal:  FOBT negative, greenish/black stool c/w the po iron.  No mass or rectal narrowing.      Extremities: anasarca extending to upper thighs.  Overall muscle wasting.  Neuro/Psych:  Oriented x 3.  Delayed response time.  Requiring assist to stand and transfer to chair.  Moves all 4s.   Lab Results:  Recent Labs  02/18/15 0430 02/19/15 0445  WBC 7.0 8.4  HGB 8.5* 9.7*  HCT 26.3* 30.8*  PLT 159 243   BMET  Recent Labs  02/18/15 0430 02/19/15 0445  NA 136 138  K 3.8 3.9  CL 102 106  CO2 26 23  GLUCOSE 153* 167*  BUN 23* 25*  CREATININE 1.46* 1.35*  CALCIUM 7.4* 8.0*   LFT No results for input(s): PROT, ALBUMIN, AST, ALT, ALKPHOS, BILITOT, BILIDIR, IBILI in the last 72 hours. PT/INR  Recent Labs  02/19/15 0445 02/20/15 0405  LABPROT 15.5* 15.2  INR 1.21 1.18   Hepatitis Panel No results for input(s): HEPBSAG, HCVAB, HEPAIGM, HEPBIGM in the last 72 hours.  Studies/Results: Dg Chest 2  View  02/19/2015   CLINICAL DATA:  CABG.  EXAM: CHEST  2 VIEW  COMPARISON:  02/18/2015.  FINDINGS: Interval removal of right IJ sheath. Prior CABG. Heart size normal. Interim improvement of bibasilar atelectasis and/or infiltrates. Small bilateral pleural effusions. No pneumothorax. Aspirated barium again noted both lung bases. No acute bony abnormality.  IMPRESSION: 1. Interim removal of right IJ sheath. 2. Interim improvement of bibasilar atelectasis and/or infiltrates. Persistent small pleural effusions. 3. Prior CABG .  Cardiomegaly.  Normal pulmonary vascularity.   Electronically Signed   By: Maisie Fus  Register   On: 02/19/2015 07:59   Scheduled Meds: . aspirin EC  81 mg Oral  Daily  . atorvastatin  40 mg Oral q1800  . bisacodyl  10 mg Oral Daily   Or  . bisacodyl  10 mg Rectal Daily  . docusate sodium  200 mg Oral Daily  . enoxaparin (LOVENOX) injection  30 mg Subcutaneous QHS  . feeding supplement (ENSURE ENLIVE)  237 mL Oral TID WC  . ferrous fumarate-b12-vitamic C-folic acid  1 capsule Oral TID PC  . furosemide  40 mg Oral Daily  . insulin aspart  0-24 Units Subcutaneous TID AC & HS  . latanoprost  1 drop Both Eyes QHS  . metoprolol tartrate  12.5 mg Oral BID   Or  . metoprolol tartrate  12.5 mg Per Tube BID  . pantoprazole  40 mg Oral Daily  . potassium chloride  20 mEq Oral Daily  . saccharomyces boulardii  250 mg Oral BID  . sodium chloride  3 mL Intravenous Q12H  . sodium chloride  3 mL Intravenous Q12H  . sucralfate  1 g Oral 4 times per day  . warfarin  2 mg Oral q1800  . Warfarin - Physician Dosing Inpatient   Does not apply q1800   Continuous Infusions: . sodium chloride     PRN Meds:.sodium chloride, magnesium hydroxide, metoprolol, ondansetron (ZOFRAN) IV, oxyCODONE, sodium chloride, sodium chloride, traMADol   ASSESMENT:    * Severe reflux esophagitis causing UGIB.   Cg emesis, EGD 5/16 #1: grade 4 esophagitis. tereated with high dose PPI.  EGD # 2 5/31: grade 3 esophagitis, still with fribility, bleeding, nodularity. Still having melenic stool Carafate added to PPI 5/31.  Warfarin restarted.   * Acute biliary pancreatitis on admission 5/8. Resolved.  Choledocholithiasis.  MRCP 01/15/15 not convincing for CBD stone ("potential 4 mm filling defect") CT 01/18/15 with CBD stone but not obstructing/ no ductal dilatation. Acute pancreatitis with fluid in pericolic gutters and pelvis.  Last LFTs 02/13/15: all normal except low albumin.  On 5/26 general surgery said they would consider lap chole 3 to 6 months post recovery from CABG.  By exam has non-tender mass in RUQ/? pseudocyst.   * Aspiration per esophagram. Repeatedly  normal BSS, latest 6/1: "Pt presents with functional swallow - he reports aspiration event this morning during barium swallow. This was likely due to instructions requiring that he consume large, consecutive boluses of thin liquid, a condition that is known to elicit aspiration in this pt. When pt consumes small, controlled bolus sizes, he is able to protect his airway. Clinical reassessment during lunch today revealed functional toleration of regular diet with thin liquids."  Still has some dysphagia.    * ABL anemia. Transfusion PRBC x 6 total for this admit, last on 5/30.   * bil LE DVTs. S/p IVC filter. Restarted Coumadin 3 days ago.   *  Protein malnutrition.  Low albumin and  prealbumin. Eating 25 to 100% of meals, average about 50%.    *  SB ileus during early part of admission, resolved.   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 CABG 6/9.    * AKI/CKD. 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  Per PT "make progress towards physical therapy goals. Ambulates slowly up to 200 feet today with a rolling walker for support."  By exam today has significant weakness.      PLAN   *  D/w Dr Christella Hartigan: up Protonix to BID and discontinue Carafate.   Jennye Moccasin  02/20/2015, 10:24 AM Pager: [phone]  ________________________________________________________________________  [facility] MD note:  I personally examined the patient, reviewed the data and agree with the assessment and plan described above.  Had nice discussion with him and his daughter in room.  Will change meds as above.  We will also set up return office visit with Dr. Leone Payor in 6-8 weeks so that we can began to again address his GB, gallstone pancreatitis.  He will likely need cholecystectomy and may also need ERCP.  He just had CABG and so that should wait 2-3 month ideally.   Rob Bunting, MD [facility] Pager [phone]

## 2015-02-20 NOTE — Progress Notes (Signed)
Pt continued to use urinal at this time; pt continues to void in bathroom; pt sitting up in chair; family in room; will cont. To monitor.

## 2015-02-20 NOTE — Progress Notes (Signed)
Pt ambulated 350 feet with rolling walker; pt back to room to chair at this time; call bell w/i reach; will cont. To monitor.

## 2015-02-21 LAB — GLUCOSE, CAPILLARY
GLUCOSE-CAPILLARY: 158 mg/dL — AB (ref 65–99)
Glucose-Capillary: 202 mg/dL — ABNORMAL HIGH (ref 65–99)

## 2015-02-21 LAB — PROTIME-INR
INR: 1.26 (ref 0.00–1.49)
Prothrombin Time: 15.9 seconds — ABNORMAL HIGH (ref 11.6–15.2)

## 2015-02-21 MED ORDER — LISINOPRIL 10 MG PO TABS: 10.0000 mg | ORAL_TABLET | Freq: Every day | ORAL | Status: DC

## 2015-02-21 MED ORDER — FE FUMARATE-B12-VIT C-FA-IFC PO CAPS: 1.0000 | ORAL_CAPSULE | Freq: Three times a day (TID) | ORAL | Status: DC

## 2015-02-21 MED ORDER — OXYCODONE HCL 5 MG PO TABS: 5.0000 mg | ORAL_TABLET | ORAL | Status: DC | PRN

## 2015-02-21 MED ORDER — WARFARIN SODIUM 2.5 MG PO TABS: 2.5000 mg | ORAL_TABLET | Freq: Every day | ORAL | Status: DC

## 2015-02-21 MED ORDER — SACCHAROMYCES BOULARDII 250 MG PO CAPS: 250.0000 mg | ORAL_CAPSULE | Freq: Two times a day (BID) | ORAL | Status: DC

## 2015-02-21 MED ORDER — POTASSIUM CHLORIDE ER 10 MEQ PO TBCR: 20.0000 meq | EXTENDED_RELEASE_TABLET | Freq: Every day | ORAL | Status: DC

## 2015-02-21 MED ORDER — LISINOPRIL 10 MG PO TABS
10.0000 mg | ORAL_TABLET | Freq: Every day | ORAL | Status: DC
  Filled 2015-02-21: qty 1

## 2015-02-21 MED ORDER — ATORVASTATIN CALCIUM 40 MG PO TABS: 40.0000 mg | ORAL_TABLET | Freq: Every day | ORAL | Status: DC

## 2015-02-21 MED ORDER — ASPIRIN 81 MG PO TBEC: 81.0000 mg | DELAYED_RELEASE_TABLET | Freq: Every day | ORAL | Status: AC

## 2015-02-21 MED ORDER — METOPROLOL TARTRATE 25 MG PO TABS: 12.5000 mg | ORAL_TABLET | Freq: Two times a day (BID) | ORAL | Status: DC

## 2015-02-21 MED ORDER — FUROSEMIDE 40 MG PO TABS: 40.0000 mg | ORAL_TABLET | Freq: Every day | ORAL | Status: DC

## 2015-02-21 MED ORDER — POTASSIUM CHLORIDE CRYS ER 20 MEQ PO TBCR: 20.0000 meq | EXTENDED_RELEASE_TABLET | Freq: Every day | ORAL | Status: DC

## 2015-02-21 MED ORDER — PANTOPRAZOLE SODIUM 40 MG PO TBEC: 40.0000 mg | DELAYED_RELEASE_TABLET | Freq: Two times a day (BID) | ORAL | Status: DC

## 2015-02-21 NOTE — Progress Notes (Signed)
02/21/2015 0845 Chest tube sutures d/c'd.  Steri strips applied.  Pt tolerated well. Kathryne Hitch

## 2015-02-21 NOTE — Progress Notes (Signed)
Utilization review completed.  

## 2015-02-21 NOTE — Progress Notes (Addendum)
[address]       [place] [address]             [phone]      7 Days Post-Op Procedure(s) (LRB): CORONARY ARTERY BYPASS GRAFTING (CABG) x three, using left internal mammary artery and left leg greater saphenous vein harvested endoscopically (N/A) TRANSESOPHAGEAL ECHOCARDIOGRAM (TEE) (N/A) Subjective: conts to feels well  Objective: Vital signs in last 24 hours: Temp:  [97.8 F (36.6 C)-98.7 F (37.1 C)] 97.8 F (36.6 C) (06/16 0539) Pulse Rate:  [80-91] 88 (06/16 0539) Cardiac Rhythm:  [-] Normal sinus rhythm (06/15 2100) Resp:  [18] 18 (06/16 0539) BP: (119-144)/(52-65) 144/61 mmHg (06/16 0539) SpO2:  [93 %-97 %] 97 % (06/16 0539) Weight:  [170 lb 10.2 oz (77.4 kg)] 170 lb 10.2 oz (77.4 kg) (06/16 0539)  Hemodynamic parameters for last 24 hours:    Intake/Output from previous day: 06/15 0701 - 06/16 0700 In: 360 [P.O.:360] Out: 400 [Urine:400] Intake/Output this shift:    General appearance: alert, cooperative and no distress Heart: regular rate and rhythm and occ extrasystole Lungs: clear to auscultation bilaterally Abdomen: benign Extremities: minor edema  Wound: incis healing well  Lab Results:  Recent Labs  02/19/15 0445  WBC 8.4  HGB 9.7*  HCT 30.8*  PLT 243   BMET:  Recent Labs  02/19/15 0445  NA 138  K 3.9  CL 106  CO2 23  GLUCOSE 167*  BUN 25*  CREATININE 1.35*  CALCIUM 8.0*    PT/INR:  Recent Labs  02/21/15 0455  LABPROT 15.9*  INR 1.26   ABG    Component Value Date/Time   PHART 7.383 02/14/2015 2021   HCO3 24.0 02/14/2015 2021   TCO2 22 02/15/2015 1539   ACIDBASEDEF 1.0 02/14/2015 2021   O2SAT 98.0 02/14/2015 2021   CBG (last 3)   Recent Labs  02/20/15 1648 02/20/15 2117 02/21/15 0618  GLUCAP 128* 176* 158*    Meds Scheduled Meds: . aspirin EC  81 mg Oral Daily  . atorvastatin  40 mg Oral q1800  . bisacodyl  10 mg Oral Daily   Or  . bisacodyl  10 mg Rectal Daily  . docusate  sodium  200 mg Oral Daily  . enoxaparin (LOVENOX) injection  30 mg Subcutaneous QHS  . feeding supplement (ENSURE ENLIVE)  237 mL Oral TID WC  . ferrous fumarate-b12-vitamic C-folic acid  1 capsule Oral TID PC  . furosemide  40 mg Oral Daily  . insulin aspart  0-24 Units Subcutaneous TID AC & HS  . latanoprost  1 drop Both Eyes QHS  . metoprolol tartrate  12.5 mg Oral BID   Or  . metoprolol tartrate  12.5 mg Per Tube BID  . pantoprazole  40 mg Oral BID  . potassium chloride  20 mEq Oral Daily  . saccharomyces boulardii  250 mg Oral BID  . sodium chloride  3 mL Intravenous Q12H  . sodium chloride  3 mL Intravenous Q12H  . warfarin  2 mg Oral q1800  . Warfarin - Physician Dosing Inpatient   Does not apply q1800   Continuous Infusions: . sodium chloride     PRN Meds:.sodium chloride, magnesium hydroxide, metoprolol, ondansetron (ZOFRAN) IV, oxyCODONE, sodium chloride, sodium chloride, traMADol  Xrays No results found.  Assessment/Plan: S/P Procedure(s) (LRB): CORONARY ARTERY BYPASS GRAFTING (CABG) x three, using left internal mammary artery and left leg greater saphenous vein harvested endoscopically (N/A) TRANSESOPHAGEAL ECHOCARDIOGRAM (TEE) (N/A) 1  conts to do well, appears stable for d/c to home     LOS: 39 days    GOLD,WAYNE E 02/21/2015  Ready for DC HHN to draw INR mon 6-20 >>>> preop bilat DVT, INR goal 2.0-2.5  patient examined and medical record reviewed,agree with above note. Kathlee Nations Trigt III 02/21/2015

## 2015-02-21 NOTE — Progress Notes (Signed)
02/21/2015 12:10 PM Discharge AVS meds taken today and those due this evening reviewed.  Follow-up appointments and when to call md reviewed.  D/C IV and TELE.  Questions and concerns addressed.   D/C home per orders. Kathryne Hitch

## 2015-02-21 NOTE — Progress Notes (Signed)
Discharge Med Rec Note: Ziquan, Musch  The following medications were initiated, discontinued, or adjusted during Mr. Arutyunyan' stay for coronary artery bypass grafting (CABG x 3) on 02/14/15.  Initiated: Aspirin 81 mg PO daily. In the setting of CABG, aspirin therapy was initiated to reduce the risk of graft blockage.   Metoprolol 12.5 mg PO BID. After CABG, metoprolol was initiated to reduce the risk of postoperative atrial fibrillation, heart attack and death.  Furosemide 40 mg PO daily. In the setting of newly diagnosed heart failure, this medication was initiated to maintain stable fluid status.   Potassium chloride 20 mEq PO daily. To prevent against low potassium levels which may occur with furosemide use, Mr. Fryberger was sent home with potassium supplementation.  Warfarin 2.5 mg PO daily. Warfarin 2 mg PO daily was initiated postoperatively for Mr. Encarnacion with new bilateral DVTs in his lower extremities. His INR on the day of discharge was below goal at 1.26 with a lower goal of 1.8-2.2 for a history of upper GI bleed from esophageal erosions.   Lisinopril 10 mg PO daily. This blood pressure medication was initiated in place of Mr. Jon' previous lisinopril/hctz since his blood pressures were slightly low during his stay.   Ferrous fumarate-b12-vitamin C-folic acid. In the setting of postoperative blood loss anemia, Mr. Dec was initiated on this medication in an attempt to increase blood counts.  Oxycodone 5 mg PO q4h PRN for severe pain. Mr. Ormiston was discharged with a prescription for this medication for as needed use of severe postoperative pain.  Adjusted: Atorvastatin 10 mg PO daily increased to 40 mg PO daily. In the setting of CABG, Mr. Girard' atorvastatin was increased to high intensity dosing to decrease the risk of graft blockage, adverse coronary events and death.  Discontinued: Lisinopril/hctz 20-12.5 mg PO daily. Since Mr. Creese' blood pressures were low during his  postoperative stay, he was sent home on a lower dose of lisinopril only.    Meds were reconciled in Epic and patient was counseled on date of discharge.  Tyler Deis. Bonnye Fava, PharmD, BCPS Clinical Pharmacist - Resident Pager: [phone] Pharmacy: [phone] 02/21/2015 8:55 AM

## 2015-02-21 NOTE — Progress Notes (Signed)
CARDIAC REHAB PHASE I   [facility] discharge education completed. Reviewed IS, sternal precautions, activity progression, exercise, risk factors, diet including heart healthy, carb counting, heart failure education, fluid and sodium restrictions, daily weights, phase 2 cardiac rehab. Pt verbalized understanding. Pt previously watched cardiac surgery discharge video. Gave pt heart failure book and zone tool. Pt still agreeable to phase 2 cardiac rehab. Referral sent to [facility]. Pt states he is ready for discharge.  [phone]   Joylene Grapes, RN, BSN 02/21/2015 11:14 AM

## 2015-02-22 ENCOUNTER — Ambulatory Visit (INDEPENDENT_AMBULATORY_CARE_PROVIDER_SITE_OTHER): Payer: Medicare Other | Admitting: Cardiology

## 2015-02-22 ENCOUNTER — Telehealth: Payer: Self-pay | Admitting: Cardiovascular Disease

## 2015-02-22 ENCOUNTER — Encounter (INDEPENDENT_AMBULATORY_CARE_PROVIDER_SITE_OTHER): Payer: Medicare Other

## 2015-02-22 DIAGNOSIS — I82403 Acute embolism and thrombosis of unspecified deep veins of lower extremity, bilateral: Secondary | ICD-10-CM

## 2015-02-22 LAB — POCT INR: INR: 1.2

## 2015-02-22 NOTE — Telephone Encounter (Signed)
Received call from [place] with [facility].  She went to the home and attempted venopuncture x 3 with no success.  She did a POCT INR and got 1.2.  This is consistent with his result from the hospital yesterday so will use it rather than continue to try to get a venopuncture.  See anti-coag note for dosing details.

## 2015-02-22 NOTE — Telephone Encounter (Signed)
New message      [facility] has a new pt in their office.  His INR is 5 and their pharmacist is out of the office.  Please call

## 2015-02-22 NOTE — Telephone Encounter (Signed)
Spoke with pt's daughter who is a PA.  Pt's INR was 1.2 when he left the hospital yesterday.  Was reading 5.0 on the coag machine.  I am not convinced that the POCT INR is correct.  Given recent DVT and GI bleed, would like for pt to go and get a venopuncture done.  A Rx was sent to [facility].  Will await results.

## 2015-02-25 ENCOUNTER — Encounter: Payer: Self-pay | Admitting: *Deleted

## 2015-02-25 ENCOUNTER — Ambulatory Visit (INDEPENDENT_AMBULATORY_CARE_PROVIDER_SITE_OTHER): Payer: Medicare Other | Admitting: *Deleted

## 2015-02-25 DIAGNOSIS — I82409 Acute embolism and thrombosis of unspecified deep veins of unspecified lower extremity: Secondary | ICD-10-CM

## 2015-02-25 LAB — POCT INR: INR: 1.4

## 2015-02-26 NOTE — Progress Notes (Signed)
This encounter was created in error - please disregard.

## 2015-02-27 ENCOUNTER — Other Ambulatory Visit: Payer: Self-pay | Admitting: *Deleted

## 2015-02-27 DIAGNOSIS — R609 Edema, unspecified: Secondary | ICD-10-CM

## 2015-02-27 MED ORDER — FUROSEMIDE 40 MG PO TABS: 40.0000 mg | ORAL_TABLET | Freq: Every day | ORAL | Status: DC

## 2015-02-27 MED ORDER — POTASSIUM CHLORIDE ER 10 MEQ PO TBCR: 20.0000 meq | EXTENDED_RELEASE_TABLET | Freq: Every day | ORAL | Status: DC

## 2015-02-28 ENCOUNTER — Ambulatory Visit (INDEPENDENT_AMBULATORY_CARE_PROVIDER_SITE_OTHER): Payer: Medicare Other | Admitting: Cardiology

## 2015-02-28 LAB — POCT INR: INR: 1.5

## 2015-03-01 ENCOUNTER — Other Ambulatory Visit: Payer: Self-pay | Admitting: Cardiothoracic Surgery

## 2015-03-01 DIAGNOSIS — Z951 Presence of aortocoronary bypass graft: Secondary | ICD-10-CM

## 2015-03-04 ENCOUNTER — Encounter: Payer: Self-pay | Admitting: Cardiothoracic Surgery

## 2015-03-04 ENCOUNTER — Ambulatory Visit
Admission: RE | Admit: 2015-03-04 | Discharge: 2015-03-04 | Disposition: A | Discharge status: Home / Self Care | Payer: Medicare Other | Source: Ambulatory Visit | Attending: Cardiothoracic Surgery | Admitting: Cardiothoracic Surgery

## 2015-03-04 ENCOUNTER — Ambulatory Visit (INDEPENDENT_AMBULATORY_CARE_PROVIDER_SITE_OTHER): Payer: Self-pay | Admitting: Cardiothoracic Surgery

## 2015-03-04 VITALS — BP 117/66 | HR 100 | Resp 20 | Ht 72.0 in | Wt 162.0 lb

## 2015-03-04 DIAGNOSIS — Z951 Presence of aortocoronary bypass graft: Secondary | ICD-10-CM

## 2015-03-04 DIAGNOSIS — R609 Edema, unspecified: Secondary | ICD-10-CM

## 2015-03-04 MED ORDER — POTASSIUM CHLORIDE ER 10 MEQ PO TBCR: 20.0000 meq | EXTENDED_RELEASE_TABLET | Freq: Every day | ORAL | Status: DC

## 2015-03-04 MED ORDER — FUROSEMIDE 40 MG PO TABS: 40.0000 mg | ORAL_TABLET | Freq: Every day | ORAL | Status: DC

## 2015-03-04 NOTE — Progress Notes (Signed)
PCP is Kirstie Peri, MD Referring Provider is Kirstie Peri, MD  Chief Complaint  Patient presents with  . Routine Post Op    f/u from surgery with CXR s/p CABG x 3 on 02/14/15    [id number] returns for her first office visit after CABG during a prolonged hospitalization with multiple serious medical problems occurring over several weeks. He presented with acute gallstone pancreatitis, septic shock, positive cardiac enzymes, and LV dysfunction on echocardiogram. He developed GI bleeding, DVT,pulmonary emboli, caval filter, and aspiration pneumonitis. He was intubated at one point. Cardiac catheterization demonstrated severe multivessel CAD and he will underwent CABG after his nutritional, pulmonary and renal status were optimized . He did fairly well after surgery and is now home with home health nursing and physical therapy. His severe pedal edema has improved but not resolved. He has cellulitis around a right leg incision from saphenous vein harvest. Sternal incision is well-healed. Chest x-ray today shows no significant pleural effusions, slight blunting of left costophrenic angle. The patient is in sinus rhythm and uses a walker.  After the patient heals from cardiac surgery he will ultimately need cholecystectomy and then later removal of vena cava filter.   Past Medical History  Diagnosis Date  . Hypertension   . Diabetes mellitus type 2 in obese   . Cholelithiasis     Past Surgical History  Procedure Laterality Date  . Esophagogastroduodenoscopy N/A 01/21/2015    Procedure: ESOPHAGOGASTRODUODENOSCOPY (EGD);  Surgeon: Hilarie Fredrickson, MD;  Location: [facility] ENDOSCOPY;  Service: Endoscopy;  Laterality: N/A;  to be done at BEDSIDE  . Cardiac catheterization N/A 01/17/2015    Procedure: Left Heart Cath and Coronary Angiography;  Surgeon: Lyn Records, MD;  Location: [facility] INVASIVE CV LAB;  Service: Cardiovascular;  Laterality: N/A;  . Esophagogastroduodenoscopy N/A 02/05/2015    Procedure:  ESOPHAGOGASTRODUODENOSCOPY (EGD);  Surgeon: Hart Carwin, MD;  Location: [facility] ENDOSCOPY;  Service: Endoscopy;  Laterality: N/A;  . Coronary artery bypass graft N/A 02/14/2015    Procedure: CORONARY ARTERY BYPASS GRAFTING (CABG) x three, using left internal mammary artery and left leg greater saphenous vein harvested endoscopically;  Surgeon: Kerin Perna, MD;  Location: [facility] OR;  Service: Open Heart Surgery;  Laterality: N/A;  . Tee without cardioversion N/A 02/14/2015    Procedure: TRANSESOPHAGEAL ECHOCARDIOGRAM (TEE);  Surgeon: Kerin Perna, MD;  Location: [facility] OR;  Service: Open Heart Surgery;  Laterality: N/A;    Family History  Problem Relation Age of Onset  . Heart attack Brother 55  . Pancreatic cancer Sister 37    died in her 50s.     Social History History  Substance Use Topics  . Smoking status: Never Smoker   . Smokeless tobacco: Not on file  . Alcohol Use: No    Current Outpatient Prescriptions  Medication Sig Dispense Refill  . aspirin EC 81 MG EC tablet Take 1 tablet (81 mg total) by mouth daily.    Marland Kitchen atorvastatin (LIPITOR) 40 MG tablet Take 1 tablet (40 mg total) by mouth daily at 6 PM. 30 tablet 1  . ferrous fumarate-b12-vitamic C-folic acid (TRINSICON / FOLTRIN) capsule Take 1 capsule by mouth 3 (three) times daily after meals. 90 capsule 1  . furosemide (LASIX) 40 MG tablet Take 1 tablet (40 mg total) by mouth daily. 14 tablet 0  . latanoprost (XALATAN) 0.005 % ophthalmic solution Place 1 drop into both eyes at bedtime.    Marland Kitchen lisinopril (PRINIVIL,ZESTRIL) 10 MG tablet Take 1 tablet (10 mg total)  by mouth daily. 30 tablet 1  . metoprolol tartrate (LOPRESSOR) 25 MG tablet Take 0.5 tablets (12.5 mg total) by mouth 2 (two) times daily. 30 tablet 1  . oxyCODONE (OXY IR/ROXICODONE) 5 MG immediate release tablet Take 1 tablet (5 mg total) by mouth every 4 (four) hours as needed for severe pain. 50 tablet 0  . pantoprazole (PROTONIX) 40 MG tablet Take 1 tablet (40 mg total) by  mouth 2 (two) times daily. 60 tablet 1  . potassium chloride (K-DUR) 10 MEQ tablet Take 2 tablets (20 mEq total) by mouth daily. 14 tablet 0  . saccharomyces boulardii (FLORASTOR) 250 MG capsule Take 1 capsule (250 mg total) by mouth 2 (two) times daily. 60 capsule 1  . warfarin (COUMADIN) 2.5 MG tablet Take 1 tablet (2.5 mg total) by mouth daily at 6 PM. 100 tablet 2   No current facility-administered medications for this visit.    No Known Allergies  Review of Systems   The patient's weight has been stable. He denies fever. The patient developed a slight maculopapular rash over his left trunk, possible yeast and will be given nystatin cream He wears TEDs hose for his bilateral edema. He is walking 8 minutes 3 times a day at home. He denies any abdominal pain or vomiting-he does have problems with constipation and will reduce his iron dose. He denies any bleeding complications or epistaxis  BP 117/66 mmHg  Pulse 100  Resp 20  Ht 6' (1.829 m)  Wt 162 lb (73.483 kg)  BMI 21.97 kg/m2  SpO2 93% Physical Exam Alert and comfortable Sternal incision well-healed Heart rhythm regular without murmur Lungs sounds clear Abdomen with some ascites, nontender Moderate bilateral pedal edema Erythema around right mid leg incision for vein harvest  Diagnostic Tests: Chest x-ray image independently reviewed showing the costophrenic angle blunting of the left otherwise clear  Impression: Doing well after extended hospitalization for multiple significant problems. He'll continue his Lasix, lisinopril, metoprolol, Lipitor, Coumadin, and aspirin.  Plan:Continue home therapirs  Return for review of his progress in approximately 2 weeks He is scheduled to start outpatient cardiac rehabilitation at [facility] in mid July.   Mikey Bussing, MD [facility] [phone]

## 2015-03-05 ENCOUNTER — Ambulatory Visit (INDEPENDENT_AMBULATORY_CARE_PROVIDER_SITE_OTHER): Payer: Medicare Other | Admitting: Interventional Cardiology

## 2015-03-05 LAB — POCT INR: INR: 1.6

## 2015-03-06 ENCOUNTER — Encounter: Payer: Self-pay | Admitting: Cardiovascular Disease

## 2015-03-06 ENCOUNTER — Ambulatory Visit (INDEPENDENT_AMBULATORY_CARE_PROVIDER_SITE_OTHER): Payer: Medicare Other | Admitting: Cardiovascular Disease

## 2015-03-06 VITALS — BP 110/64 | HR 96 | Ht 72.0 in | Wt 158.0 lb

## 2015-03-06 DIAGNOSIS — I252 Old myocardial infarction: Secondary | ICD-10-CM

## 2015-03-06 DIAGNOSIS — I82409 Acute embolism and thrombosis of unspecified deep veins of unspecified lower extremity: Secondary | ICD-10-CM

## 2015-03-06 DIAGNOSIS — K254 Chronic or unspecified gastric ulcer with hemorrhage: Secondary | ICD-10-CM

## 2015-03-06 DIAGNOSIS — I10 Essential (primary) hypertension: Secondary | ICD-10-CM

## 2015-03-06 DIAGNOSIS — K851 Biliary acute pancreatitis without necrosis or infection: Secondary | ICD-10-CM

## 2015-03-06 DIAGNOSIS — Z7901 Long term (current) use of anticoagulants: Secondary | ICD-10-CM

## 2015-03-06 DIAGNOSIS — I25810 Atherosclerosis of coronary artery bypass graft(s) without angina pectoris: Secondary | ICD-10-CM

## 2015-03-06 DIAGNOSIS — Z951 Presence of aortocoronary bypass graft: Secondary | ICD-10-CM | POA: Diagnosis not present

## 2015-03-06 DIAGNOSIS — Z9289 Personal history of other medical treatment: Secondary | ICD-10-CM

## 2015-03-06 DIAGNOSIS — Z87898 Personal history of other specified conditions: Secondary | ICD-10-CM

## 2015-03-06 MED ORDER — METOPROLOL SUCCINATE ER 25 MG PO TB24: 12.5000 mg | ORAL_TABLET | Freq: Every day | ORAL | Status: DC

## 2015-03-06 MED ORDER — METOPROLOL SUCCINATE ER 25 MG PO TB24: 12.5000 mg | ORAL_TABLET | Freq: Two times a day (BID) | ORAL | Status: DC

## 2015-03-06 NOTE — Progress Notes (Signed)
Patient ID: [name] [name], male   DOB: [birth year], 74 y.o.   MRN: [id number]      SUBJECTIVE: The patient is a 74 year old male who I am reading for the first time today. He was discharged earlier this month after a prolonged hospitalization. He suffered from gallstone pancreatitis and ultimately sustained a non-STEMI with acute systolic congestive heart failure. He developed acute upper GI bleeding as well as hospital-acquired pneumonia. He had an EGD which showed grade 4 esophagitis with deep ulcers and friability. 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 He is also on warfarin.  He underwent three-vessel CABG with a LIMA to the LAD, SVG to the diagonal, and SVG to the obtuse marginal branch. Transesophageal echocardiogram on 6/9 demonstrated moderately reduced left ventricular systolic function, EF 35-40%, global hypokinesis with marked inferior wall hypokinesis. There was mild mitral regurgitation.  He saw Dr. Donata Clay on 6/27 in the office. He is planned to undergo cholecystectomy and then later removal of IVC filter after he heals sufficiently from his cardiac surgery.  He is here with his wife and daughter who lives in [place]. He is feeling well and denies chest pain and shortness of breath. He said his leg swelling has also been gradually improving. He denies any bleeding problems.   Review of Systems: As per "subjective", otherwise negative.  No Known Allergies  Current Outpatient Prescriptions  Medication Sig Dispense Refill  . aspirin EC 81 MG EC tablet Take 1 tablet (81 mg total) by mouth daily.    Marland Kitchen atorvastatin (LIPITOR) 40 MG tablet Take 1 tablet (40 mg total) by mouth daily at 6 PM. 30 tablet 1  . cephALEXin (KEFLEX) 500 MG capsule Take 500 mg by mouth 3 (three) times daily.    Tery Sanfilippo Sodium (COLACE PO) Take by mouth.    . ferrous fumarate-b12-vitamic C-folic acid (TRINSICON / FOLTRIN) capsule Take 1 capsule by mouth 3 (three) times  daily after meals. 90 capsule 1  . furosemide (LASIX) 40 MG tablet Take 1 tablet (40 mg total) by mouth daily. 14 tablet 0  . latanoprost (XALATAN) 0.005 % ophthalmic solution Place 1 drop into both eyes at bedtime.    Marland Kitchen lisinopril (PRINIVIL,ZESTRIL) 10 MG tablet Take 1 tablet (10 mg total) by mouth daily. 30 tablet 1  . metoprolol tartrate (LOPRESSOR) 25 MG tablet Take 0.5 tablets (12.5 mg total) by mouth 2 (two) times daily. 30 tablet 1  . nystatin cream (MYCOSTATIN) Apply 1 application topically daily.    Marland Kitchen oxyCODONE (OXY IR/ROXICODONE) 5 MG immediate release tablet Take 1 tablet (5 mg total) by mouth every 4 (four) hours as needed for severe pain. 50 tablet 0  . pantoprazole (PROTONIX) 40 MG tablet Take 1 tablet (40 mg total) by mouth 2 (two) times daily. 60 tablet 1  . potassium chloride (K-DUR) 10 MEQ tablet Take 2 tablets (20 mEq total) by mouth daily. 14 tablet 0  . saccharomyces boulardii (FLORASTOR) 250 MG capsule Take 1 capsule (250 mg total) by mouth 2 (two) times daily. 60 capsule 1  . warfarin (COUMADIN) 2.5 MG tablet Take 1 tablet (2.5 mg total) by mouth daily at 6 PM. 100 tablet 2   No current facility-administered medications for this visit.    Past Medical History  Diagnosis Date  . Hypertension   . Diabetes mellitus type 2 in obese   . Cholelithiasis     Past Surgical History  Procedure Laterality Date  . Esophagogastroduodenoscopy N/A  01/21/2015    Procedure: ESOPHAGOGASTRODUODENOSCOPY (EGD);  Surgeon: Hilarie Fredrickson, MD;  Location: [facility] ENDOSCOPY;  Service: Endoscopy;  Laterality: N/A;  to be done at [facility]IDE  . Cardiac catheterization N/A 01/17/2015    Procedure: Left Heart Cath and Coronary Angiography;  Surgeon: Lyn Records, MD;  Location: [facility] INVASIVE CV LAB;  Service: Cardiovascular;  Laterality: N/A;  . Esophagogastroduodenoscopy N/A 02/05/2015    Procedure: ESOPHAGOGASTRODUODENOSCOPY (EGD);  Surgeon: Hart Carwin, MD;  Location: [facility] ENDOSCOPY;  Service: Endoscopy;   Laterality: N/A;  . Coronary artery bypass graft N/A 02/14/2015    Procedure: CORONARY ARTERY BYPASS GRAFTING (CABG) x three, using left internal mammary artery and left leg greater saphenous vein harvested endoscopically;  Surgeon: Kerin Perna, MD;  Location: [facility] OR;  Service: Open Heart Surgery;  Laterality: N/A;  . Tee without cardioversion N/A 02/14/2015    Procedure: TRANSESOPHAGEAL ECHOCARDIOGRAM (TEE);  Surgeon: Kerin Perna, MD;  Location: [facility] OR;  Service: Open Heart Surgery;  Laterality: N/A;    History   Social History  . Marital Status: Married    Spouse Name: N/A  . Number of Children: N/A  . Years of Education: N/A   Occupational History  . Not on file.   Social History Main Topics  . Smoking status: Never Smoker   . Smokeless tobacco: Never Used  . Alcohol Use: No  . Drug Use: No  . Sexual Activity: Yes   Other Topics Concern  . Not on file   Social History Narrative     Filed Vitals:   03/06/15 0919  BP: 110/64  Pulse: 96  Height: 6' (1.829 m)  Weight: 158 lb (71.668 kg)  SpO2: 99%    PHYSICAL EXAM General: NAD HEENT: Normal. Neck: No JVD, no thyromegaly. Lungs: Clear to auscultation bilaterally with normal respiratory effort. CV: Nondisplaced PMI.  Regular rate and rhythm, normal S1/S2, no S3/S4, no murmur. No pretibial and trivial periankle edema b/l. Wearing compression stockings.  No carotid bruit.    Abdomen: Soft, nontender, no distention.  Neurologic: Alert and oriented x 3.  Psych: Normal affect. Musculoskeletal: No gross deformities. Extremities: No clubbing or cyanosis.   ECG: Most recent ECG reviewed.      ASSESSMENT AND PLAN: 1. CAD with 3-vessel CABG and non-STEMI: Symptomatically stable. Will continue aspirin, Lipitor, and lisinopril, and switch metoprolol tartrate to metoprolol succinate 12.5 mg twice daily, as the latter can potentially improve cardiac function in patients with ischemic cardiomyopathies.  2. Chronic  systolic heart failure/ischemic cardiomyopathy, EF 35-40%: Euvolemic on Lasix 40 mg daily. Continue lisinopril and switch metoprolol tartrate to succinate, as the latter can potentially improve cardiac function in patients with ischemic cardiomyopathies. Will check BMET.  3. 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 Will enroll in anticoagulation clinic. Will eventually undergo IVC filter removal.  4. Hyperlipidemia: On Lipitor 40 mg.  5. Essential HTN: Controlled. No changes.  6. GI bleed: Will check CBC.   Time spent: 40 minutes, of which greater than 50% was spent reviewing symptoms, relevant blood tests and studies, and discussing management plan with the patient.   Prentice Docker, M.D., F.A.C.C.

## 2015-03-06 NOTE — Patient Instructions (Signed)
   Stop Metoprolol tartrate  Begin Metoprolol Succinate (Toprol XL) 12.5mg  twice a day  - will have to take 1/2 tab of a 25mg  tablet - new sent to pharmacy today. Continue all other medications.   Labs for CBC, BMET Office will contact with results via phone or letter.   Enroll in our Coumadin clinic here in [place].   Follow up in  3 months

## 2015-03-07 ENCOUNTER — Other Ambulatory Visit: Payer: Self-pay | Admitting: *Deleted

## 2015-03-07 MED ORDER — METOPROLOL SUCCINATE ER 25 MG PO TB24: 12.5000 mg | ORAL_TABLET | Freq: Two times a day (BID) | ORAL | Status: DC

## 2015-03-08 ENCOUNTER — Telehealth: Payer: Self-pay | Admitting: Cardiovascular Disease

## 2015-03-08 DIAGNOSIS — R609 Edema, unspecified: Secondary | ICD-10-CM

## 2015-03-08 MED ORDER — POTASSIUM CHLORIDE ER 10 MEQ PO TBCR: 20.0000 meq | EXTENDED_RELEASE_TABLET | Freq: Every day | ORAL | Status: DC

## 2015-03-08 NOTE — Telephone Encounter (Signed)
Discussed Potassium with daughter.  Advised to continue for now.  Will notify of labs as soon as we have available.  Daughter verbalized understanding.

## 2015-03-12 ENCOUNTER — Ambulatory Visit (INDEPENDENT_AMBULATORY_CARE_PROVIDER_SITE_OTHER): Payer: Medicare Other | Admitting: *Deleted

## 2015-03-12 ENCOUNTER — Telehealth: Payer: Self-pay | Admitting: *Deleted

## 2015-03-12 DIAGNOSIS — I82409 Acute embolism and thrombosis of unspecified deep veins of unspecified lower extremity: Secondary | ICD-10-CM

## 2015-03-12 LAB — POCT INR: INR: 2.2

## 2015-03-12 MED ORDER — LISINOPRIL 5 MG PO TABS: 5.0000 mg | ORAL_TABLET | Freq: Every day | ORAL | Status: DC

## 2015-03-12 NOTE — Telephone Encounter (Signed)
Daughter informed and verbalized understanding of plan. 

## 2015-03-12 NOTE — Addendum Note (Signed)
Addended by: Eustace Moore on: 03/12/2015 04:45 PM   Modules accepted: Orders, Medications

## 2015-03-12 NOTE — Telephone Encounter (Signed)
FYI- Per daughter called the on call surgeon and patient's lasix and K+ were stopped due to patient's BP dropping.

## 2015-03-12 NOTE — Telephone Encounter (Signed)
Would resume Lasix and K and try reducing lisinopril to 5 mg daily. Otherwise, he may reaccumulate fluid. If he continues to become hypotensive in spite of this, would then d/c lisinopril.

## 2015-03-13 ENCOUNTER — Telehealth: Payer: Self-pay | Admitting: *Deleted

## 2015-03-13 NOTE — Telephone Encounter (Signed)
-----   Message from Laqueta Linden, MD sent at 03/12/2015 10:27 AM EDT ----- Renal function stable. Hgb 9.5. Will need f/u with PCP.

## 2015-03-13 NOTE — Telephone Encounter (Signed)
Cheri Rous (daughter) called with concerns of low BP.  BP this morning 69/41 & 78/48  69.  No c/o SOB, chest pain.  Does note some fatigue.  No dizziness, but is just sitting up in chair trying to do some leg lifts for therapy.  Patient declined to resume the Potassium and Lasix as previously recommended as Dr. Alla German told him to stop.  Did not feel comfortable going back on this.  Will be seeing Morton Peters for follow up on 03/20/15.  Also, stated that he did not decrease the Lisinopril to 5mg  as suggested by Dr. Purvis Sheffield.  Reviewed Dr. Junius Argyle recommendations from yesterday & strongly encouraged him to follow these instructions.  Daughter stated that she will make sure he decreases his Lisinopril this evening.  If this does not help, per Dr. Purvis Sheffield - he should discontinue all together.  Also, reminded her to be mindful of the possibility of fluid reaccumulation without the fluid medication.  Stated that he has not had any weight gain or swelling.  Encouraged her to give him some Gatorade that may help a little.  Advised to take to ED for evaluation if symptoms worsen.    Daughter verbalized understanding.

## 2015-03-13 NOTE — Telephone Encounter (Signed)
Notes Recorded by Lesle Chris, LPN on 02/12/7372 at 2:30 PM Wife notified. Will forward copy to PMD Sherryll Burger). Informed her to call their office to follow up on this.

## 2015-03-18 DIAGNOSIS — I82409 Acute embolism and thrombosis of unspecified deep veins of unspecified lower extremity: Secondary | ICD-10-CM | POA: Insufficient documentation

## 2015-03-19 ENCOUNTER — Ambulatory Visit (INDEPENDENT_AMBULATORY_CARE_PROVIDER_SITE_OTHER): Payer: Medicare Other | Admitting: *Deleted

## 2015-03-19 DIAGNOSIS — I82409 Acute embolism and thrombosis of unspecified deep veins of unspecified lower extremity: Secondary | ICD-10-CM

## 2015-03-19 LAB — POCT INR: INR: 1.7

## 2015-03-20 ENCOUNTER — Encounter: Payer: Self-pay | Admitting: Cardiothoracic Surgery

## 2015-03-20 ENCOUNTER — Ambulatory Visit: Payer: Medicare Other | Admitting: Cardiothoracic Surgery

## 2015-03-20 ENCOUNTER — Ambulatory Visit (INDEPENDENT_AMBULATORY_CARE_PROVIDER_SITE_OTHER): Payer: Self-pay | Admitting: Cardiothoracic Surgery

## 2015-03-20 VITALS — BP 149/73 | HR 71 | Resp 16 | Ht 72.0 in | Wt 143.0 lb

## 2015-03-20 DIAGNOSIS — Z951 Presence of aortocoronary bypass graft: Secondary | ICD-10-CM

## 2015-03-20 NOTE — Progress Notes (Signed)
PCP is Kirstie Peri, MD Referring Provider is Kirstie Peri, MD  Chief Complaint  Patient presents with  . Routine Post Op    2 week f/u s/p CABG on 02/14/15    [id number] returns for surgical follow 1 month after urgent CABG x3 after non-ST elevation MI in the setting of acute gallstone pancreatitis, DVT, upper GI bleeding from erosive  Esophagitis, acute pulmonary edema requiring intubation, and protein loss malnutrition.  The patient is on chronic Coumadin for DVT. He also has a vena cava filter. The patient has residual gallstones and will need cholecystectomy after allowing 3 months of recovery after his heart surgery. The patient is scheduled to start outpatient cardiac rehabilitation at [facility].   Since last visit the patient's peripheral edema has resolved and he is off Lasix The patient's blood pressure is improved and he is tolerating lisinopril and Toprol-XL He denies any bleeding complications from his Coumadin-last INR 1.7 and Coumadin dose was adjusted He is maintained sinus rhythm and the surgical incisions are healing.   The patient is ready to increase his activity level to include driving short distances of up to 5-10 minutes. He can lift up to 20 pounds. He is ready to start outpatient cardiac rehabilitation.  Past Medical History  Diagnosis Date  . Hypertension   . Diabetes mellitus type 2 in obese   . Cholelithiasis     Past Surgical History  Procedure Laterality Date  . Esophagogastroduodenoscopy N/A 01/21/2015    Procedure: ESOPHAGOGASTRODUODENOSCOPY (EGD);  Surgeon: Hilarie Fredrickson, MD;  Location: [facility] ENDOSCOPY;  Service: Endoscopy;  Laterality: N/A;  to be done at [facility]IDE  . Cardiac catheterization N/A 01/17/2015    Procedure: Left Heart Cath and Coronary Angiography;  Surgeon: Lyn Records, MD;  Location: [facility] INVASIVE CV LAB;  Service: Cardiovascular;  Laterality: N/A;  . Esophagogastroduodenoscopy N/A 02/05/2015    Procedure: ESOPHAGOGASTRODUODENOSCOPY  (EGD);  Surgeon: Hart Carwin, MD;  Location: [facility] ENDOSCOPY;  Service: Endoscopy;  Laterality: N/A;  . Coronary artery bypass graft N/A 02/14/2015    Procedure: CORONARY ARTERY BYPASS GRAFTING (CABG) x three, using left internal mammary artery and left leg greater saphenous vein harvested endoscopically;  Surgeon: Kerin Perna, MD;  Location: [facility] OR;  Service: Open Heart Surgery;  Laterality: N/A;  . Tee without cardioversion N/A 02/14/2015    Procedure: TRANSESOPHAGEAL ECHOCARDIOGRAM (TEE);  Surgeon: Kerin Perna, MD;  Location: [facility] OR;  Service: Open Heart Surgery;  Laterality: N/A;    Family History  Problem Relation Age of Onset  . Heart attack Brother 55  . Pancreatic cancer Sister 19    died in her 80s.     Social History History  Substance Use Topics  . Smoking status: Never Smoker   . Smokeless tobacco: Never Used  . Alcohol Use: No    Current Outpatient Prescriptions  Medication Sig Dispense Refill  . aspirin EC 81 MG EC tablet Take 1 tablet (81 mg total) by mouth daily.    Marland Kitchen atorvastatin (LIPITOR) 40 MG tablet Take 1 tablet (40 mg total) by mouth daily at 6 PM. 30 tablet 1  . Docusate Sodium (COLACE PO) Take by mouth.    . ferrous fumarate-b12-vitamic C-folic acid (TRINSICON / FOLTRIN) capsule Take 1 capsule by mouth 3 (three) times daily after meals. 90 capsule 1  . latanoprost (XALATAN) 0.005 % ophthalmic solution Place 1 drop into both eyes at bedtime.    Marland Kitchen lisinopril (PRINIVIL,ZESTRIL) 5 MG tablet Take 1 tablet (5 mg total)  by mouth daily. 90 tablet 3  . metoprolol succinate (TOPROL XL) 25 MG 24 hr tablet Take 0.5 tablets (12.5 mg total) by mouth 2 (two) times daily. 30 tablet 6  . pantoprazole (PROTONIX) 40 MG tablet Take 1 tablet (40 mg total) by mouth 2 (two) times daily. 60 tablet 1  . warfarin (COUMADIN) 2.5 MG tablet Take 1 tablet (2.5 mg total) by mouth daily at 6 PM. 100 tablet 2   No current facility-administered medications for this visit.    No Known  Allergies  Review of Systems  Improved strength and appetite No fevers No falls or dizzy spells Stools remain somewhat loose  BP 149/73 mmHg  Pulse 71  Resp 16  Ht 6' (1.829 m)  Wt 143 lb (64.864 kg)  BMI 19.39 kg/m2  SpO2 97% Physical Exam Alert and comfortable Lungs clear with slight diminished breath sounds at left base Heart rhythm regular murmur or gallop Sternum well-healed No pedal edema Abdomen nontender  Diagnostic Tests: No chest x-ray performed today. Last chest x-ray showed a mild left pleural effusion. We will obtain chest x-ray and his next visit.  Impression: Improvement in overall status after multivessel CABG The patient is still quite deconditioned and under weight. He would benefit from outpatient cardiac rehabilitation.  Plan:continue current medications as directed by his cardiologist. Return for review in 6 weeks. The patient will need referral to general surgery for cholecystectomy at [place] later in the fall. He'll need to remain on Coumadin through that operation.   Mikey Bussing, MD [facility] [phone]

## 2015-03-21 ENCOUNTER — Encounter (HOSPITAL_COMMUNITY)
Admission: RE | Admit: 2015-03-21 | Discharge: 2015-03-21 | Disposition: A | Discharge status: Home / Self Care | Payer: Medicare Other | Source: Ambulatory Visit | Attending: Cardiovascular Disease | Admitting: Cardiovascular Disease

## 2015-03-21 VITALS — BP 108/62 | HR 98 | Ht 72.0 in | Wt 146.3 lb

## 2015-03-21 DIAGNOSIS — Z951 Presence of aortocoronary bypass graft: Secondary | ICD-10-CM

## 2015-03-21 DIAGNOSIS — I214 Non-ST elevation (NSTEMI) myocardial infarction: Secondary | ICD-10-CM

## 2015-03-21 DIAGNOSIS — I252 Old myocardial infarction: Secondary | ICD-10-CM | POA: Diagnosis not present

## 2015-03-21 NOTE — Progress Notes (Signed)
Patient arrived for 1st visit/orientation/education at 8:00am. Patient was referred to CR by Dr. Purvis Sheffield post NSTEMI I21.4 and CABGx3 Z95.1. During orientation advised patient on arrival and appointment times what to wear, what to do before, during and after exercise. Reviewed attendance and class policy. Talked about inclement weather and class consultation policy. Pt is scheduled to return Cardiac Rehab on 03/25/15 at 9:30am. Pt was advised to come to class 5 minutes before class starts. He was also given instructions on meeting with the dietician and attending the Family Structure classes. Pt is eager to get started. Patient was able to complete 6 minute walk test. Patient was measured for the equipment. Discussed equipment safety with patient. Took patient pre-anthropometric measurements. Patient finished visit at 10:45am.

## 2015-03-21 NOTE — Progress Notes (Signed)
Cardiac/Pulmonary Rehab Medication Review by a Pharmacist  Does the patient  feel that his/her medications are working for him/her?  no  Has the patient been experiencing any side effects to the medications prescribed?  no  Does the patient measure his/her own blood pressure or blood glucose at home?  yes - Checks blood sugar 3 times daily & Blood pressure 1-2 times daily, and weight/temperature daily  Does the patient have any problems obtaining medications due to transportation or finances?   no  Understanding of regimen: fair Understanding of indications: fair Potential of compliance: good  Questions asked to Determine Patient Understanding of Medication Regimen:  1. What is the name of the medication?  2. What is the medication used for?  3. When should it be taken?  4. How much should be taken?  5. How will you take it?  6. What side effects should you report?  Understanding Defined as: Excellent: All questions above are correct Good: Questions 1-4 are correct Fair: Questions 1-2 are correct  Poor: 1 or none of the above questions are correct   Pharmacist comments: Patient unsure of medication names, can recall some indications.  Daughter fills pill box for him & he takes from there.  He has no complaints.   Elson Clan 03/21/2015 8:52 AM

## 2015-03-25 ENCOUNTER — Encounter (HOSPITAL_COMMUNITY)
Admission: RE | Admit: 2015-03-25 | Discharge: 2015-03-25 | Disposition: A | Discharge status: Home / Self Care | Payer: Medicare Other | Source: Ambulatory Visit | Attending: Cardiovascular Disease | Admitting: Cardiovascular Disease

## 2015-03-25 DIAGNOSIS — I252 Old myocardial infarction: Secondary | ICD-10-CM | POA: Diagnosis not present

## 2015-03-27 ENCOUNTER — Encounter (HOSPITAL_COMMUNITY)
Admission: RE | Admit: 2015-03-27 | Discharge: 2015-03-27 | Disposition: A | Discharge status: Home / Self Care | Payer: Medicare Other | Source: Ambulatory Visit | Attending: Cardiovascular Disease | Admitting: Cardiovascular Disease

## 2015-03-27 ENCOUNTER — Ambulatory Visit (INDEPENDENT_AMBULATORY_CARE_PROVIDER_SITE_OTHER): Payer: Medicare Other | Admitting: *Deleted

## 2015-03-27 DIAGNOSIS — I252 Old myocardial infarction: Secondary | ICD-10-CM | POA: Diagnosis not present

## 2015-03-27 DIAGNOSIS — I82409 Acute embolism and thrombosis of unspecified deep veins of unspecified lower extremity: Secondary | ICD-10-CM

## 2015-03-27 LAB — POCT INR: INR: 2.4

## 2015-03-27 NOTE — Progress Notes (Signed)
Cardiac Rehabilitation Program Outcomes Report   Orientation:  03/21/15 Graduate Date:  tbd Discharge Date:  tbd # of sessions completed: 3  Cardiologist: Purvis Sheffield Family MD:  Leona Singleton Time:  0930  A.  Exercise Program:  Tolerates exercise @ 3.42 METS for 15 minutes and Walk Test Results:  Pre: 2.30 mets  B.  Mental Health:  Good mental attitude  C.  Education/Instruction/Skills  Accurately checks own pulse.  Rest:  97  Exercise:  114  Uses Perceived Exertion Scale and/or Dyspnea Scale  D.  Nutrition/Weight Control/Body Composition:  Adherence to prescribed nutrition program: fair    E.  Blood Lipids    Lab Results  Component Value Date   CHOL 104 01/15/2015   HDL 26* 01/15/2015   LDLCALC 64 01/15/2015   TRIG 195* 01/19/2015   CHOLHDL 4.0 01/15/2015    F.  Lifestyle Changes:  Making positive lifestyle changes  G.  Symptoms noted with exercise:  Asymptomatic  Report Completed By:  Doretha Sou RN   Comments:  This is patients first week progress note for [facility]iac Re[facility]

## 2015-03-28 ENCOUNTER — Telehealth: Payer: Self-pay | Admitting: Internal Medicine

## 2015-03-28 ENCOUNTER — Other Ambulatory Visit: Payer: Self-pay | Admitting: Surgical

## 2015-03-28 NOTE — Telephone Encounter (Signed)
Patient will come in and see Amy Esterwood PA next week on Thursday at 2:00

## 2015-03-29 ENCOUNTER — Encounter (HOSPITAL_COMMUNITY)
Admission: RE | Admit: 2015-03-29 | Discharge: 2015-03-29 | Disposition: A | Discharge status: Home / Self Care | Payer: Medicare Other | Source: Ambulatory Visit | Attending: Cardiovascular Disease | Admitting: Cardiovascular Disease

## 2015-03-29 DIAGNOSIS — I252 Old myocardial infarction: Secondary | ICD-10-CM | POA: Diagnosis not present

## 2015-04-01 ENCOUNTER — Encounter (HOSPITAL_COMMUNITY)
Admission: RE | Admit: 2015-04-01 | Discharge: 2015-04-01 | Disposition: A | Discharge status: Home / Self Care | Payer: Medicare Other | Source: Ambulatory Visit | Attending: Cardiovascular Disease | Admitting: Cardiovascular Disease

## 2015-04-01 DIAGNOSIS — I252 Old myocardial infarction: Secondary | ICD-10-CM | POA: Diagnosis not present

## 2015-04-03 ENCOUNTER — Encounter (HOSPITAL_COMMUNITY)
Admission: RE | Admit: 2015-04-03 | Discharge: 2015-04-03 | Disposition: A | Discharge status: Home / Self Care | Payer: Medicare Other | Source: Ambulatory Visit | Attending: Cardiovascular Disease | Admitting: Cardiovascular Disease

## 2015-04-03 ENCOUNTER — Ambulatory Visit (INDEPENDENT_AMBULATORY_CARE_PROVIDER_SITE_OTHER): Payer: Medicare Other | Admitting: *Deleted

## 2015-04-03 DIAGNOSIS — I252 Old myocardial infarction: Secondary | ICD-10-CM | POA: Diagnosis not present

## 2015-04-03 DIAGNOSIS — I82409 Acute embolism and thrombosis of unspecified deep veins of unspecified lower extremity: Secondary | ICD-10-CM | POA: Diagnosis not present

## 2015-04-03 LAB — POCT INR: INR: 3.1

## 2015-04-04 ENCOUNTER — Other Ambulatory Visit (INDEPENDENT_AMBULATORY_CARE_PROVIDER_SITE_OTHER): Payer: Medicare Other

## 2015-04-04 ENCOUNTER — Ambulatory Visit (INDEPENDENT_AMBULATORY_CARE_PROVIDER_SITE_OTHER): Payer: Medicare Other | Admitting: Physician Assistant

## 2015-04-04 ENCOUNTER — Encounter: Payer: Self-pay | Admitting: Physician Assistant

## 2015-04-04 ENCOUNTER — Telehealth: Payer: Self-pay | Admitting: *Deleted

## 2015-04-04 VITALS — BP 118/62 | HR 72 | Ht 70.47 in | Wt 143.4 lb

## 2015-04-04 DIAGNOSIS — R634 Abnormal weight loss: Secondary | ICD-10-CM | POA: Diagnosis not present

## 2015-04-04 DIAGNOSIS — K8050 Calculus of bile duct without cholangitis or cholecystitis without obstruction: Secondary | ICD-10-CM

## 2015-04-04 DIAGNOSIS — K859 Acute pancreatitis, unspecified: Secondary | ICD-10-CM

## 2015-04-04 DIAGNOSIS — K8020 Calculus of gallbladder without cholecystitis without obstruction: Secondary | ICD-10-CM

## 2015-04-04 DIAGNOSIS — K851 Biliary acute pancreatitis without necrosis or infection: Secondary | ICD-10-CM

## 2015-04-04 LAB — CBC WITH DIFFERENTIAL/PLATELET
BASOS PCT: 0.5 % (ref 0.0–3.0)
Basophils Absolute: 0 10*3/uL (ref 0.0–0.1)
EOS PCT: 5.1 % — AB (ref 0.0–5.0)
Eosinophils Absolute: 0.3 10*3/uL (ref 0.0–0.7)
HCT: 32.8 % — ABNORMAL LOW (ref 39.0–52.0)
Hemoglobin: 10.5 g/dL — ABNORMAL LOW (ref 13.0–17.0)
Lymphocytes Relative: 30.7 % (ref 12.0–46.0)
Lymphs Abs: 2 10*3/uL (ref 0.7–4.0)
MCHC: 32 g/dL (ref 30.0–36.0)
MCV: 80.8 fl (ref 78.0–100.0)
MONO ABS: 0.4 10*3/uL (ref 0.1–1.0)
Monocytes Relative: 5.6 % (ref 3.0–12.0)
NEUTROS PCT: 58.1 % (ref 43.0–77.0)
Neutro Abs: 3.7 10*3/uL (ref 1.4–7.7)
PLATELETS: 230 10*3/uL (ref 150.0–400.0)
RBC: 4.06 Mil/uL — AB (ref 4.22–5.81)
RDW: 16.3 % — AB (ref 11.5–15.5)
WBC: 6.4 10*3/uL (ref 4.0–10.5)

## 2015-04-04 LAB — COMPREHENSIVE METABOLIC PANEL
ALBUMIN: 3.3 g/dL — AB (ref 3.5–5.2)
ALK PHOS: 98 U/L (ref 39–117)
ALT: 36 U/L (ref 0–53)
AST: 34 U/L (ref 0–37)
BILIRUBIN TOTAL: 1.2 mg/dL (ref 0.2–1.2)
BUN: 22 mg/dL (ref 6–23)
CHLORIDE: 102 meq/L (ref 96–112)
CO2: 23 mEq/L (ref 19–32)
CREATININE: 1.41 mg/dL (ref 0.40–1.50)
Calcium: 8.7 mg/dL (ref 8.4–10.5)
GFR: 52.39 mL/min — ABNORMAL LOW (ref >60.00)
Glucose, Bld: 192 mg/dL — ABNORMAL HIGH (ref 70–99)
Potassium: 4.5 mEq/L (ref 3.5–5.1)
SODIUM: 136 meq/L (ref 135–145)
Total Protein: 7.6 g/dL (ref 6.0–8.3)

## 2015-04-04 LAB — LIPASE: LIPASE: 53 U/L (ref 11.0–59.0)

## 2015-04-04 NOTE — Progress Notes (Signed)
Patient ID: [name] [name], male   DOB: 1941-03-26, 74 y.o.   MRN: [id number]   Subjective:    Patient ID: [name] [name], male    DOB: 02-25-1941, 74 y.o.   MRN: [id number]  HPI Notnamed is a pleasant 74 year old white male known to Dr. Leone Payor from hospitalization in May 2016 at which time he had an acute gallstone pancreatitis complicated by an MI. He subsequently underwent CABG on 02/14/2015 He comes in today for his first post hospital follow-up. He was seen by surgery and GI during his initial hospitalization, but because of cardiac complications it was felt that cholecystectomy should be delayed at least 3 months. Most recent imaging of the abdomen was done in May 2016 which did show multiple gallstones and evidence of common bile duct stones as well as acute pancreatitis. Patient also has history of adult-onset diabetes mellitus, DVT with previous vena cava filter, and hypertension. He has just started cardiac rehabilitation and says he is starting to gain some of his strength back to his still quite fatigued. He denies any abdominal pain but says he really hasn't had any appetite over the past few months. He denies any nausea or vomiting. He is not having any postprandial abdominal discomfort early satiety etc. just says he is not hungry. He has lost 25-30 pounds since the onset of his acute illness but his family feels that his weight has leveled off over the past couple weeks in the 140-range. Patient is chronically anticoagulated with Coumadin. He had been evaluated by Dr. Lindie Spruce for surgery but does not have follow-up appointment scheduled.  Review of Systems Pertinent positive and negative review of systems were noted in the above HPI section.  All other review of systems was otherwise negative.  Outpatient Encounter Prescriptions as of 04/04/2015  Medication Sig  . aspirin EC 81 MG EC tablet Take 1 tablet (81 mg total) by mouth daily.  Marland Kitchen atorvastatin (LIPITOR) 40 MG tablet Take 1 tablet  (40 mg total) by mouth daily at 6 PM.  . insulin lispro (HUMALOG KWIKPEN) 100 UNIT/ML KiwkPen Inject 2-10 Units into the skin 3 (three) times daily. Per sliding scale for blood sugar > 200 units  . latanoprost (XALATAN) 0.005 % ophthalmic solution Place 1 drop into both eyes at bedtime.  Marland Kitchen lisinopril (PRINIVIL,ZESTRIL) 5 MG tablet Take 1 tablet (5 mg total) by mouth daily.  . metFORMIN (GLUCOPHAGE) 500 MG tablet Take 500 mg by mouth 2 (two) times daily with a meal.  . metoprolol succinate (TOPROL XL) 25 MG 24 hr tablet Take 0.5 tablets (12.5 mg total) by mouth 2 (two) times daily.  . pantoprazole (PROTONIX) 40 MG tablet TAKE ONE TABLET BY MOUTH TWICE DAILY  . warfarin (COUMADIN) 2.5 MG tablet Take 1 tablet (2.5 mg total) by mouth daily at 6 PM. (Patient taking differently: Take 2.5-3.75 mg by mouth daily at 6 PM. Takes 1 tablet on Thurs & Sun and 1.5 tablets on other 5 days)  . [DISCONTINUED] ferrous fumarate-b12-vitamic C-folic acid (TRINSICON / FOLTRIN) capsule Take 1 capsule by mouth 3 (three) times daily after meals. (Patient taking differently: Take 1 capsule by mouth 2 (two) times daily after a meal. )   No facility-administered encounter medications on file as of 04/04/2015.   No Known Allergies Patient Active Problem List   Diagnosis Date Noted  . Uncontrolled diabetes mellitus with complications   . Acute DVT (deep venous thrombosis)   . 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 Aspiration pneumonia   .  Hypokalemia 02/04/2015  . Ischemic cardiomyopathy   . Cardiomyopathy, ischemic   . DVT (deep venous thrombosis)   . Demand ischemia of myocardium 01/22/2015  . Erosive esophagitis   . HCAP (healthcare-associated pneumonia)   . Shock   . Acute blood loss anemia 01/21/2015  . Hemorrhagic shock 01/21/2015  . Acute respiratory failure with hypoxemia   . Septic shock   . Ulcerative esophagitis   . Hematemesis with nausea   . Melena   . CAD, multiple vessel: CTO of RCA, Cx with severe prox LAD  disease     Class: Diagnosis of  . Cholecystitis, acute   . Metabolic acidosis   . Acute systolic CHF (congestive heart failure)   . Acute renal failure syndrome   . NSTEMI (non-ST elevated myocardial infarction) 01/15/2015  . Cardiomyopathy 01/15/2015  . Choledocholithiasis 01/15/2015  . Hypocalcemia 01/15/2015  . Ileus 01/15/2015  . Syncope and collapse   . Acute systolic congestive heart failure   . Essential hypertension   . Other specified hypotension   . Acute kidney injury   . Diabetes type 2, uncontrolled   . Acute gallstone pancreatitis 01/13/2015  . Elevated LFTs 01/13/2015  . Gallstones 01/13/2015  . Syncope 01/13/2015  . Hypotension 01/13/2015  . Bradycardia 01/13/2015  . AKI (acute kidney injury) 01/13/2015  . Diabetes 01/13/2015   History   Social History  . Marital Status: Married    Spouse Name: N/A  . Number of Children: N/A  . Years of Education: N/A   Occupational History  . Not on file.   Social History Main Topics  . Smoking status: Never Smoker   . Smokeless tobacco: Never Used  . Alcohol Use: No  . Drug Use: No  . Sexual Activity: Yes   Other Topics Concern  . Not on file   Social History Narrative    Mr. Bouknight family history includes Heart attack (age of onset: 28) in his brother; Pancreatic cancer (age of onset: 27) in his sister.      Objective:    Filed Vitals:   04/04/15 1351  BP: 118/62  Pulse: 72    Physical Exam  well-developed older white male in no acute distress, thin and fatigued-appearing accompanied by 2 family members blood pressure 118/62 pulse 72 height 5 foot 10 weight 143. HEENT; nontraumatic normocephalic EOMI PERRLA sclera anicteric, Supple no JVD, Cardiovascular; regular rate and rhythm with S1-S2 no murmur rub or gallop he has a sternal incisional scar which is healing, Pulmonary; clear bilaterally, Abdomen; soft, flat ,basically nontender there is some fullness in the right upper quadrant no guarding or  rebound no palpable mass bowel sounds are present, Rectal; exam not done, Extremities; no clubbing cyanosis or edema skin warm and dry, Neuropsych; mood and affect appropriate       Assessment & Plan:   #1 74 yo male with recent acute biliary  pancreatitis 01/2015 with choledocholithiasis noted on Ct scan. #2 acute MI 01/2015 same admission - now s/p CABG 02/14/2015 #3 hx of DVT, and vena cava filter #4 chronic anticoagulation-Coumadin  #5 cardiomyopathy  #6 AODM #7 weight loss secondary to acute illnesses  Plan; check  labs today Follow up Ct abdomen pelvis with contrast-r/o pseudocyst etc with persistent weight loss , fullness Schedule appt with Dr Lindie Spruce to discuss timing of lap chole  Add boost or carnation instant breakfast between meals twice daily Discussed possible need for ERCP - will see what labs and CT show-hopefully can go to lap chole  with IOC first.      Sammuel Cooper PA-C 04/04/2015   Cc: Kirstie Peri, MD

## 2015-04-04 NOTE — Patient Instructions (Addendum)
Please go to the basement level to have your labs drawn.  Add Boost or Carnation Instant Breakfast, twice daily between meals.  We will call you with an appointment with Dr. Wyatt regarding the Lap cholesystectomy.    You have been scheduled for a CT scan of the abdomen and pelvis at [facility] ([address]---this is in the same building as [facility]).   You are scheduled on  Tuesday 04-09-2015 at 10:00 am . You should arrive at 9:45 am to your appointment time for registration. Please follow the written instructions below on the day of your exam:  WARNING: IF YOU ARE ALLERGIC TO IODINE/X-RAY DYE, PLEASE NOTIFY RADIOLOGY IMMEDIATELY AT [phone]! YOU WILL BE GIVEN A 13 HOUR PREMEDICATION PREP.  1) Do not eat  anything after 6:00 am  (4 hours prior to your test) 2) You have been given 2 bottles of oral contrast to drink. The solution may taste better if refrigerated, but do NOT add ice or any other liquid to this solution. Shake well before drinking.    Drink 1 bottle of contrast @  8:00 am  (2 hours prior to your exam)  Drink 1 bottle of contrast @ 9:00 am  (1 hour prior to your exam)  You may take any medications as prescribed with a small amount of water except for the following: Metformin, Glucophage, Glucovance, Avandamet, Riomet, Fortamet, Actoplus Met, Janumet, Glumetza or Metaglip. The above medications must be held the day of the exam AND 48 hours after the exam.  The purpose of you drinking the oral contrast is to aid in the visualization of your intestinal tract. The contrast solution may cause some diarrhea. Before your exam is started, you will be given a small amount of fluid to drink. Depending on your individual set of symptoms, you may also receive an intravenous injection of x-ray contrast/dye. Plan on being at [facility] for 30 minutes or long, depending on the type of exam you are having performed.  If you have any questions regarding your  exam or if you need to reschedule, you may call the CT department at [phone] between the hours of 8:00 am and 5:00 pm, Monday-Friday.  ________________________________________________________________________ You have an appointment with Dr. Ramirez, 04-15-2015 at 9:30 am .  Arrive at 9:10 am.  

## 2015-04-04 NOTE — Telephone Encounter (Signed)
[name] [name] for the patient. Advised him of appointment at [facility] surgery on 04-15-2015 at 9:30 am.  He is to arrive at 9:10 am.  He will see Dr. Derrell Lolling.

## 2015-04-05 ENCOUNTER — Encounter (HOSPITAL_COMMUNITY)
Admission: RE | Admit: 2015-04-05 | Discharge: 2015-04-05 | Disposition: A | Discharge status: Home / Self Care | Payer: Medicare Other | Source: Ambulatory Visit | Attending: Cardiovascular Disease | Admitting: Cardiovascular Disease

## 2015-04-05 DIAGNOSIS — I252 Old myocardial infarction: Secondary | ICD-10-CM | POA: Diagnosis not present

## 2015-04-08 ENCOUNTER — Encounter (HOSPITAL_COMMUNITY)
Admission: RE | Admit: 2015-04-08 | Discharge: 2015-04-08 | Disposition: A | Discharge status: Home / Self Care | Payer: Medicare Other | Source: Ambulatory Visit | Attending: Cardiovascular Disease | Admitting: Cardiovascular Disease

## 2015-04-08 DIAGNOSIS — Z951 Presence of aortocoronary bypass graft: Secondary | ICD-10-CM | POA: Diagnosis not present

## 2015-04-08 DIAGNOSIS — I252 Old myocardial infarction: Secondary | ICD-10-CM | POA: Insufficient documentation

## 2015-04-09 ENCOUNTER — Ambulatory Visit (INDEPENDENT_AMBULATORY_CARE_PROVIDER_SITE_OTHER)
Admission: RE | Admit: 2015-04-09 | Discharge: 2015-04-09 | Disposition: A | Discharge status: Home / Self Care | Payer: Medicare Other | Source: Ambulatory Visit | Attending: Physician Assistant | Admitting: Physician Assistant

## 2015-04-09 DIAGNOSIS — K859 Acute pancreatitis, unspecified: Secondary | ICD-10-CM

## 2015-04-09 DIAGNOSIS — I252 Old myocardial infarction: Secondary | ICD-10-CM | POA: Diagnosis not present

## 2015-04-09 DIAGNOSIS — K8020 Calculus of gallbladder without cholecystitis without obstruction: Secondary | ICD-10-CM

## 2015-04-09 DIAGNOSIS — R634 Abnormal weight loss: Secondary | ICD-10-CM

## 2015-04-09 DIAGNOSIS — K851 Biliary acute pancreatitis without necrosis or infection: Secondary | ICD-10-CM

## 2015-04-09 MED ORDER — IOHEXOL 300 MG/ML  SOLN: 100.0000 mL | Freq: Once | INTRAMUSCULAR | Status: AC | PRN

## 2015-04-09 MED ORDER — IOHEXOL 300 MG/ML  SOLN
100.0000 mL | Freq: Once | INTRAMUSCULAR | Status: AC | PRN
  Administered 2015-04-09: 100 mL via INTRAVENOUS

## 2015-04-09 NOTE — Progress Notes (Signed)
Quick Note:  I highly doubt necrosis now - he needs supportive care and time to heal and eventual ERCP w/ stone removal and chole ______

## 2015-04-10 ENCOUNTER — Encounter (HOSPITAL_COMMUNITY)
Admission: RE | Admit: 2015-04-10 | Discharge: 2015-04-10 | Disposition: A | Discharge status: Home / Self Care | Payer: Medicare Other | Source: Ambulatory Visit | Attending: Cardiovascular Disease | Admitting: Cardiovascular Disease

## 2015-04-10 ENCOUNTER — Ambulatory Visit (INDEPENDENT_AMBULATORY_CARE_PROVIDER_SITE_OTHER): Payer: Medicare Other | Admitting: *Deleted

## 2015-04-10 DIAGNOSIS — I252 Old myocardial infarction: Secondary | ICD-10-CM | POA: Diagnosis not present

## 2015-04-10 DIAGNOSIS — I82409 Acute embolism and thrombosis of unspecified deep veins of unspecified lower extremity: Secondary | ICD-10-CM

## 2015-04-10 LAB — POCT INR: INR: 3.1

## 2015-04-10 NOTE — Progress Notes (Signed)
Patient was given individual home exercise plan today, 04/10/2015. Handout was reviewed and discussed. Patient verbalized an understanding.

## 2015-04-11 ENCOUNTER — Other Ambulatory Visit: Payer: Self-pay | Admitting: Surgical

## 2015-04-11 NOTE — Progress Notes (Signed)
Agree with Ms. Oswald Hillock assessment and plan. 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 Awaiting surgical f/u. As long as not sig ill acutely would try to allow him to recover from pancreatitis before ERCP and stone extraction.  Stenting instead of stone extraction is possible temporizing option.  Await surgical f/u He will need f/u me in Aug/Sept also

## 2015-04-12 ENCOUNTER — Encounter (HOSPITAL_COMMUNITY)
Admission: RE | Admit: 2015-04-12 | Discharge: 2015-04-12 | Disposition: A | Discharge status: Home / Self Care | Payer: Medicare Other | Source: Ambulatory Visit | Attending: Cardiovascular Disease | Admitting: Cardiovascular Disease

## 2015-04-12 DIAGNOSIS — I252 Old myocardial infarction: Secondary | ICD-10-CM | POA: Diagnosis not present

## 2015-04-15 ENCOUNTER — Encounter (HOSPITAL_COMMUNITY): Payer: Medicare Other

## 2015-04-16 ENCOUNTER — Other Ambulatory Visit: Payer: Self-pay | Admitting: Surgical

## 2015-04-17 ENCOUNTER — Encounter (HOSPITAL_COMMUNITY)
Admission: RE | Admit: 2015-04-17 | Discharge: 2015-04-17 | Disposition: A | Discharge status: Home / Self Care | Payer: Medicare Other | Source: Ambulatory Visit | Attending: Cardiovascular Disease | Admitting: Cardiovascular Disease

## 2015-04-17 ENCOUNTER — Ambulatory Visit (INDEPENDENT_AMBULATORY_CARE_PROVIDER_SITE_OTHER): Payer: Medicare Other | Admitting: *Deleted

## 2015-04-17 DIAGNOSIS — I252 Old myocardial infarction: Secondary | ICD-10-CM | POA: Diagnosis not present

## 2015-04-17 DIAGNOSIS — I82409 Acute embolism and thrombosis of unspecified deep veins of unspecified lower extremity: Secondary | ICD-10-CM | POA: Diagnosis not present

## 2015-04-17 LAB — POCT INR: INR: 2.9

## 2015-04-18 ENCOUNTER — Other Ambulatory Visit: Payer: Self-pay | Admitting: Surgical

## 2015-04-18 ENCOUNTER — Other Ambulatory Visit: Payer: Self-pay | Admitting: Cardiovascular Disease

## 2015-04-18 MED ORDER — ATORVASTATIN CALCIUM 40 MG PO TABS: 40.0000 mg | ORAL_TABLET | Freq: Every day | ORAL | Status: DC

## 2015-04-18 NOTE — Telephone Encounter (Signed)
atorvastatin (LIPITOR) 40 MG tablet  Has enough to last until this Sunday.  [facility] told patient they have faxed [place] numerous times [facility]

## 2015-04-18 NOTE — Telephone Encounter (Signed)
Medication sent to pharmacy. Refill was sent June 2016 by Dr. Gershon Crane and confirmed receipt from pharmacy

## 2015-04-19 ENCOUNTER — Encounter (HOSPITAL_COMMUNITY)
Admission: RE | Admit: 2015-04-19 | Discharge: 2015-04-19 | Disposition: A | Discharge status: Home / Self Care | Payer: Medicare Other | Source: Ambulatory Visit | Attending: Cardiovascular Disease | Admitting: Cardiovascular Disease

## 2015-04-19 ENCOUNTER — Other Ambulatory Visit: Payer: Self-pay | Admitting: *Deleted

## 2015-04-19 DIAGNOSIS — I252 Old myocardial infarction: Secondary | ICD-10-CM | POA: Diagnosis not present

## 2015-04-19 MED ORDER — WARFARIN SODIUM 2.5 MG PO TABS: 2.5000 mg | ORAL_TABLET | Freq: Every day | ORAL | Status: DC

## 2015-04-22 ENCOUNTER — Encounter (HOSPITAL_COMMUNITY)
Admission: RE | Admit: 2015-04-22 | Discharge: 2015-04-22 | Disposition: A | Discharge status: Home / Self Care | Payer: Medicare Other | Source: Ambulatory Visit | Attending: Cardiovascular Disease | Admitting: Cardiovascular Disease

## 2015-04-22 DIAGNOSIS — I252 Old myocardial infarction: Secondary | ICD-10-CM | POA: Diagnosis not present

## 2015-04-24 ENCOUNTER — Encounter (HOSPITAL_COMMUNITY): Payer: Medicare Other

## 2015-04-24 ENCOUNTER — Ambulatory Visit (INDEPENDENT_AMBULATORY_CARE_PROVIDER_SITE_OTHER): Payer: Medicare Other | Admitting: Internal Medicine

## 2015-04-24 ENCOUNTER — Encounter: Payer: Self-pay | Admitting: Internal Medicine

## 2015-04-24 VITALS — BP 110/60 | HR 65 | Ht 70.75 in | Wt 138.5 lb

## 2015-04-24 DIAGNOSIS — G4700 Insomnia, unspecified: Secondary | ICD-10-CM | POA: Diagnosis not present

## 2015-04-24 DIAGNOSIS — R630 Anorexia: Secondary | ICD-10-CM

## 2015-04-24 DIAGNOSIS — K868 Other specified diseases of pancreas: Secondary | ICD-10-CM | POA: Diagnosis not present

## 2015-04-24 DIAGNOSIS — IMO0002 Reserved for concepts with insufficient information to code with codable children: Secondary | ICD-10-CM

## 2015-04-24 DIAGNOSIS — E1165 Type 2 diabetes mellitus with hyperglycemia: Secondary | ICD-10-CM

## 2015-04-24 DIAGNOSIS — K8689 Other specified diseases of pancreas: Secondary | ICD-10-CM

## 2015-04-24 DIAGNOSIS — K851 Biliary acute pancreatitis without necrosis or infection: Secondary | ICD-10-CM

## 2015-04-24 MED ORDER — PANCRELIPASE (LIP-PROT-AMYL) 36000-114000 UNITS PO CPEP: 36000.0000 [IU] | ORAL_CAPSULE | Freq: Three times a day (TID) | ORAL | Status: DC

## 2015-04-24 MED ORDER — PANTOPRAZOLE SODIUM 40 MG PO TBEC: 40.0000 mg | DELAYED_RELEASE_TABLET | Freq: Every day | ORAL | Status: DC

## 2015-04-24 MED ORDER — MIRTAZAPINE 7.5 MG PO TABS: 7.5000 mg | ORAL_TABLET | Freq: Every day | ORAL | Status: DC

## 2015-04-24 NOTE — Patient Instructions (Addendum)
We will contact you with an appointment to see the Endocrinology Dr. Carlus Pavlov.  We have sent the following medications to your pharmacy for you to pick up at your convenience: Protonix, go to one daily before breakfast. And we rx'ing generic remeron and creon   Follow up with Dr. Leone Payor in 2 months.    I appreciate the opportunity to care for you. Stan Head, MD, [facility]

## 2015-04-24 NOTE — Assessment & Plan Note (Signed)
Endocrine referral

## 2015-04-24 NOTE — Progress Notes (Signed)
Subjective:    Patient ID: [name] [name], male    DOB: May 08, 1941, 74 y.o.   MRN: [id number] Chief complaint: Follow-up pancreatitis HPI The patient is here with his wife and daughter. He had severe acute pancreatitis from gallstones when hospitalized, he also had a three-vessel bypass surgery. It was subsequent to the pancreatitis. He had upper GI bleeding from esophagitis as well. He has been slowly improving. His appetite is off. He has some early satiety. He saw Mike Gip in late July and a CT scan showed an organizing fluid collection up to 4.1 cm, it pushes on the stomach and the duodenum and there is peripancreatic edema still. They raise question of necrosis though he has not had any fevers. He saw Dr. Daphine Deutscher in follow-up on August 12 and the plan was to see him in a month and reassess prior to any cholecystectomy. He denies overt depression but he is frustrated because he is not able to do any activities even prior to this hospitalization. He was active and woodworking etc. and couldn't take trips and he can't do that. He mostly sits around during the day. He goes to bed around 10:30 at night and wakes up at 3:30 or 4 he doesn't think that's a whole lot different. He does sleep some during the day. He has some intermittent unpredictable urgent defecation with loose stools. Probiotic might be helping that. He does have difficulty swallowing capsules his daughter tells me. Though he can do it he doesn't like to. No fevers or chills. No rectal bleeding no vomiting.  Wt Readings from Last 3 Encounters:  04/24/15 138 lb 8 oz (62.823 kg)  04/04/15 143 lb 6 oz (65.034 kg)  03/21/15 146 lb 4.8 oz (66.361 kg)   Medications, allergies, past medical history, past surgical history, family history and social history are reviewed and updated in the EMR.    Review of Systems As above    Objective:   Physical Exam BP 110/60 mmHg  Pulse 65  Ht 5' 10.75" (1.797 m)  Wt 138 lb 8 oz (62.823 kg)   BMI 19.45 kg/m2 Thin, somewhat chronically ill elderly white man in no acute distress Eyes anicteric Abdomen is soft, scaphoid. I can see bypass surgery scars in the upper abdomen that are healing. Doubt sounds are present. There is a small epigastric ventral hernia. There is mild fullness in the left upper quadrant but no tenderness. His affect is somewhat flat. I reviewed the CT scan images and findings with the patient is wife and his daughter. I told him that I did not think he had significant pancreatic necrosis ongoing though was possible it was probably more radiographically and a clinical issue if anything.    Assessment & Plan:   1. Acute biliary pancreatitis   2. Leakage of pancreatic fluid   3. Insomnia   4. Anorexia   5. Pancreatic insufficiency   6. Diabetes type 2, uncontrolled    The pancreatitis problem is probably slowly improving. I think the loose stools may be some pancreatic insufficiency. The anorexia is probably multifactorial I think there is a component of depression, or mood disorder if you will. This is understandable given the sudden change in his lifestyle etc. and the long recuperation from pancreatitis and bypass surgery.  I spent a considerable amount of time, over 25 minutes with the patient is wife and daughter explaining his issues. I explained over time things will heal although we don't know where will settle out is  going to take months at least.  I plan to see him back in 2 months. Sooner if needed. I have started him on mirtazapine 7.5 mg at bedtime. This is to treat anorexia and insomnia and what I think is situational depression. I have started him on Creon 36,000 units with meals and snacks. Changed pantoprazole to 40 mg daily.  They have requested an endocrinologist referral will help with diabetes treatment since things are changed for him and I will make a referral.  I appreciate the opportunity to care for this patient. CC: Kirstie Peri,  MD Theodoro Doing.D.

## 2015-04-25 ENCOUNTER — Telehealth: Payer: Self-pay | Admitting: Cardiovascular Disease

## 2015-04-25 ENCOUNTER — Encounter: Payer: Self-pay | Admitting: Internal Medicine

## 2015-04-25 MED ORDER — WARFARIN SODIUM 2.5 MG PO TABS: ORAL_TABLET | ORAL | Status: DC

## 2015-04-25 NOTE — Telephone Encounter (Signed)
Would like to know if his coumadin RX could be changed. Having trouble with getting filled due to being told to take differently sometime when levels are not what they should be

## 2015-04-26 ENCOUNTER — Telehealth: Payer: Self-pay

## 2015-04-26 ENCOUNTER — Encounter (HOSPITAL_COMMUNITY)
Admission: RE | Admit: 2015-04-26 | Discharge: 2015-04-26 | Disposition: A | Discharge status: Home / Self Care | Payer: Medicare Other | Source: Ambulatory Visit | Attending: Cardiovascular Disease | Admitting: Cardiovascular Disease

## 2015-04-26 DIAGNOSIS — E1165 Type 2 diabetes mellitus with hyperglycemia: Secondary | ICD-10-CM

## 2015-04-26 DIAGNOSIS — I252 Old myocardial infarction: Secondary | ICD-10-CM | POA: Diagnosis not present

## 2015-04-26 DIAGNOSIS — IMO0002 Reserved for concepts with insufficient information to code with codable children: Secondary | ICD-10-CM

## 2015-04-26 DIAGNOSIS — K851 Biliary acute pancreatitis without necrosis or infection: Secondary | ICD-10-CM

## 2015-04-26 NOTE — Telephone Encounter (Signed)
-----   Message from Iva Boop, MD sent at 04/26/2015  4:12 PM EDT ----- Regarding: FW: see someone please? Please send a referral  ----- Message -----    From: Carlus Pavlov, MD    Sent: 04/26/2015   8:53 AM      To: Iva Boop, MD Subject: RE: see someone please?                        Dr Leone Payor, Yes. Thank you for the referral! Silvestre Mesi  ----- Message -----    From: Iva Boop, MD    Sent: 04/24/2015  12:53 PM      To: Carlus Pavlov, MD Subject: see someone please?                            Are you able to see this man in the next month?  He has DM-II and issues/med changes after severe biliary pancreatitis.  Apparently Mondays and Thursdays are best?   I can place a referral if you are able to do this  THANKS!  Baldo Ash

## 2015-04-26 NOTE — Telephone Encounter (Signed)
Spoke with Hilbert Corrigan the daughter and she had actually called Dr. Charlean Sanfilippo office prior to me calling her and they saw the exchange between Dr. Leone Payor and Dr. Elvera Lennox and so the appointment has been set up for her dad for 05/30/15.

## 2015-04-29 ENCOUNTER — Encounter (HOSPITAL_COMMUNITY)
Admission: RE | Admit: 2015-04-29 | Discharge: 2015-04-29 | Disposition: A | Discharge status: Home / Self Care | Payer: Medicare Other | Source: Ambulatory Visit | Attending: Cardiovascular Disease | Admitting: Cardiovascular Disease

## 2015-04-29 ENCOUNTER — Ambulatory Visit (INDEPENDENT_AMBULATORY_CARE_PROVIDER_SITE_OTHER): Payer: Medicare Other | Admitting: *Deleted

## 2015-04-29 DIAGNOSIS — I82409 Acute embolism and thrombosis of unspecified deep veins of unspecified lower extremity: Secondary | ICD-10-CM

## 2015-04-29 DIAGNOSIS — I252 Old myocardial infarction: Secondary | ICD-10-CM | POA: Diagnosis not present

## 2015-04-29 LAB — POCT INR: INR: 1.9

## 2015-04-30 ENCOUNTER — Other Ambulatory Visit: Payer: Self-pay | Admitting: Cardiothoracic Surgery

## 2015-04-30 DIAGNOSIS — Z951 Presence of aortocoronary bypass graft: Secondary | ICD-10-CM

## 2015-05-01 ENCOUNTER — Encounter: Payer: Self-pay | Admitting: Cardiothoracic Surgery

## 2015-05-01 ENCOUNTER — Other Ambulatory Visit (INDEPENDENT_AMBULATORY_CARE_PROVIDER_SITE_OTHER): Payer: Medicare Other | Admitting: *Deleted

## 2015-05-01 ENCOUNTER — Ambulatory Visit (INDEPENDENT_AMBULATORY_CARE_PROVIDER_SITE_OTHER): Payer: Self-pay | Admitting: Cardiothoracic Surgery

## 2015-05-01 ENCOUNTER — Encounter: Payer: Self-pay | Admitting: Internal Medicine

## 2015-05-01 ENCOUNTER — Ambulatory Visit
Admission: RE | Admit: 2015-05-01 | Discharge: 2015-05-01 | Disposition: A | Discharge status: Home / Self Care | Payer: Medicare Other | Source: Ambulatory Visit | Attending: Cardiothoracic Surgery | Admitting: Cardiothoracic Surgery

## 2015-05-01 ENCOUNTER — Ambulatory Visit (INDEPENDENT_AMBULATORY_CARE_PROVIDER_SITE_OTHER): Payer: Medicare Other | Admitting: Internal Medicine

## 2015-05-01 ENCOUNTER — Encounter (HOSPITAL_COMMUNITY): Payer: Medicare Other

## 2015-05-01 VITALS — BP 104/62 | HR 98 | Temp 98.3°F | Resp 12 | Ht 71.0 in | Wt 137.8 lb

## 2015-05-01 VITALS — BP 111/69 | HR 96 | Resp 20 | Ht 71.0 in | Wt 138.0 lb

## 2015-05-01 DIAGNOSIS — E1165 Type 2 diabetes mellitus with hyperglycemia: Secondary | ICD-10-CM | POA: Diagnosis not present

## 2015-05-01 DIAGNOSIS — IMO0002 Reserved for concepts with insufficient information to code with codable children: Secondary | ICD-10-CM

## 2015-05-01 DIAGNOSIS — E1151 Type 2 diabetes mellitus with diabetic peripheral angiopathy without gangrene: Secondary | ICD-10-CM

## 2015-05-01 DIAGNOSIS — Z951 Presence of aortocoronary bypass graft: Secondary | ICD-10-CM

## 2015-05-01 DIAGNOSIS — E118 Type 2 diabetes mellitus with unspecified complications: Secondary | ICD-10-CM

## 2015-05-01 DIAGNOSIS — E1159 Type 2 diabetes mellitus with other circulatory complications: Secondary | ICD-10-CM

## 2015-05-01 LAB — POCT GLYCOSYLATED HEMOGLOBIN (HGB A1C): HEMOGLOBIN A1C: 6.9

## 2015-05-01 MED ORDER — INSULIN GLARGINE 100 UNIT/ML SOLOSTAR PEN: 6.0000 [IU] | PEN_INJECTOR | Freq: Every day | SUBCUTANEOUS | Status: DC

## 2015-05-01 NOTE — Progress Notes (Signed)
Patient ID: [name] [name], male   DOB: 1941-04-27, 74 y.o.   MRN: [id number]  HPI: [name] [name] is a 74 y.o.-year-old male, referred by his gastroenterologist, Dr Leone Payor, for management of DM2, dx in ~2006, insulin-dependent, uncontrolled, with complications (CAD, s/p AMI, s/p CABG; CKD).  Patient was admitted in 01/2015 with acute biliary pancreatitis secondary to gallstones and he had a very complicated hospitalization. He had an MI and had to have 3 vessel CABG during that hospitalization. He also developed esophagitis with GI bleeding. He is now slowly improving and continues to see Dr. Leone Payor for his pancreatitis. On the latest CT scan, there was a peripancreatic fluid collection, ? Necrosis. He has diarrhea >> started Creon >> a little better.   Last hemoglobin A1c was: Lab Results  Component Value Date   HGBA1C 7.5* 01/13/2015   Pt is on a regimen of: - Metformin 500 mg 2x a day, with meals (losing weight on a higher dose) - Humalog 2-10 units 3x a day, before meals - if sugars >200 He was on Glipizide before the pancreatitis. He was on Januvia 6 mo ago.  Pt checks his sugars 3x a day and they are: - am: 108, 122-170, 180, 220 - 2h after b'fast: n/c - before lunch: 187-271 - 2h after lunch: n/c - before dinner: 113-236 - 2h after dinner: n/c - bedtime: n/c - nighttime: n/c No lows. Lowest sugar was 70 (on Glipizide); he has hypoglycemia awareness at 70.  Highest sugar was 270s.  Glucometer: ReliOn  Pt's meals are: - Breakfast: cereal + strawberries (Special K) + oatmeal; toast + eggs - Lunch: potatoes, casseroles, chicken - Dinner: sandwich or soup, used to eat out - Snacks: yoghurt, cheese; no snacks at night  - + CKD, last BUN/creatinine:  Lab Results  Component Value Date   BUN 22 04/04/2015   CREATININE 1.41 04/04/2015  He is on lisinopril. - last set of lipids: Lab Results  Component Value Date   CHOL 104 01/15/2015   HDL 26* 01/15/2015   LDLCALC 64  01/15/2015   TRIG 195* 01/19/2015   CHOLHDL 4.0 01/15/2015  He is on Lipitor. - last eye exam was in 04/2015. No DR.  - no numbness and tingling in his feet.  Pt has FH of DM in daughter, mother.  ROS: Constitutional: + weight loss, + fatigue, no subjective hyperthermia/hypothermia Eyes: no blurry vision, no xerophthalmia ENT: no sore throat, no nodules palpated in throat, no dysphagia/odynophagia, no hoarseness Cardiovascular: no CP/SOB/palpitations/leg swelling Respiratory: no cough/SOB Gastrointestinal: no N/V/+ D/no C Musculoskeletal: no muscle/joint aches Skin: no rashes Neurological: no tremors/numbness/tingling/dizziness Psychiatric: no depression/anxiety  Past Medical History  Diagnosis Date  . Hypertension   . Diabetes mellitus type 2 in obese   . Cholelithiasis   . DVT (deep venous thrombosis)   . Cataract   . GERD (gastroesophageal reflux disease)   . MI (myocardial infarction) 01/2015  . Pancreatitis 01/2015   Past Surgical History  Procedure Laterality Date  . Esophagogastroduodenoscopy N/A 01/21/2015    Procedure: ESOPHAGOGASTRODUODENOSCOPY (EGD);  Surgeon: Hilarie Fredrickson, MD;  Location[facility] ENDOSCOPY;  Service: Endoscopy;  Laterality: N/A;  to be done at BEDSIDE  . Cardiac catheterization N/A 01/17/2015    Procedure: Left Heart Cath and Coronary Angiography;  Surgeon: Lyn Records, MD;  Location: [facility] INVASIVE CV LAB;  Service: Cardiovascular;  Laterality: N/A;  . Esophagogastroduodenoscopy N/A 02/05/2015    Procedure: ESOPHAGOGASTRODUODENOSCOPY (EGD);  Surgeon: Hart Carwin, MD;  Location: [facility]  ENDOSCOPY;  Service: Endoscopy;  Laterality: N/A;  . Coronary artery bypass graft N/A 02/14/2015    Procedure: CORONARY ARTERY BYPASS GRAFTING (CABG) x three, using left internal mammary artery and left leg greater saphenous vein harvested endoscopically;  Surgeon: Kerin Perna, MD;  Location: [facility] OR;  Service: Open Heart Surgery;  Laterality: N/A;  . Tee without cardioversion  N/A 02/14/2015    Procedure: TRANSESOPHAGEAL ECHOCARDIOGRAM (TEE);  Surgeon: Kerin Perna, MD;  Location: [facility] OR;  Service: Open Heart Surgery;  Laterality: N/A;   Social History   Social History  . Marital Status: Married    Spouse Name: N/A  . Number of Children: N/A  . Years of Education: N/A   Social History Main Topics  . Smoking status: Never Smoker   . Smokeless tobacco: Never Used  . Alcohol Use: No  . Drug Use: No   Current Outpatient Prescriptions on File Prior to Visit  Medication Sig Dispense Refill  . aspirin EC 81 MG EC tablet Take 1 tablet (81 mg total) by mouth daily.    Marland Kitchen atorvastatin (LIPITOR) 40 MG tablet Take 1 tablet (40 mg total) by mouth daily at 6 PM. 30 tablet 3  . insulin lispro (HUMALOG KWIKPEN) 100 UNIT/ML KiwkPen Inject 2-10 Units into the skin 3 (three) times daily. Per sliding scale for blood sugar > 200 units    . latanoprost (XALATAN) 0.005 % ophthalmic solution Place 1 drop into both eyes at bedtime.    . lipase/protease/amylase (CREON) 36000 UNITS CPEP capsule Take 1 capsule (36,000 Units total) by mouth 3 (three) times daily before meals. And with snacks 150 capsule 3  . lisinopril (PRINIVIL,ZESTRIL) 5 MG tablet Take 1 tablet (5 mg total) by mouth daily. 90 tablet 3  . metFORMIN (GLUCOPHAGE) 500 MG tablet Take 500 mg by mouth 2 (two) times daily with a meal.    . metoprolol succinate (TOPROL XL) 25 MG 24 hr tablet Take 0.5 tablets (12.5 mg total) by mouth 2 (two) times daily. 30 tablet 6  . mirtazapine (REMERON) 7.5 MG tablet Take 1 tablet (7.5 mg total) by mouth at bedtime. 30 tablet 3  . pantoprazole (PROTONIX) 40 MG tablet Take 1 tablet (40 mg total) by mouth daily before breakfast. 30 tablet 11  . warfarin (COUMADIN) 2.5 MG tablet Take 1 tablet daily except 1 1/2 tablets on Mondays or as directed by coumadin clinic 45 tablet 3   No current facility-administered medications on file prior to visit.   No Known Allergies Family History  Problem  Relation Age of Onset  . Heart attack Brother 55  . Pancreatic cancer Sister 8    died in her 4s.    PE: BP 104/62 mmHg  Pulse 98  Temp(Src) 98.3 F (36.8 C) (Oral)  Resp 12  Ht 5\' 11"  (1.803 m)  Wt 137 lb 12.8 oz (62.506 kg)  BMI 19.23 kg/m2  SpO2 97% Wt Readings from Last 3 Encounters:  05/01/15 138 lb (62.596 kg)  05/01/15 137 lb 12.8 oz (62.506 kg)  04/24/15 138 lb 8 oz (62.823 kg)   Constitutional: overweight, in NAD Eyes: PERRLA, EOMI, no exophthalmos ENT: moist mucous membranes, no thyromegaly, no cervical lymphadenopathy Cardiovascular: RRR, No MRG Respiratory: CTA B Gastrointestinal: abdomen soft, NT, ND, BS+ Musculoskeletal: no deformities, strength intact in all 4 Skin: moist, warm, no rashes Neurological: no tremor with outstretched hands, DTR normal in all 4  ASSESSMENT: 1. DM2, insulin-dependent, uncontrolled, with complications - CAD, s/p AMI, s/p 3v CABG -  CKD  PLAN:  1. Patient with insulin-dependent diabetes, on oral antidiabetic regimen + SSI rapid acting insulin analog. Sugars are worse mid-day, before lunch. He is trying to gain weight and eating a high carb, semisolid, meal in am (cereals, instant oatmeal). We discussed at length about ways to improve his det (given specific examples, also written references - see pt instruction section). We also discussed about the fact that using the sliding scale is usually not a great way to control sugars, but only a backup. We discussed about other possible oral medicines, but I do not feel that these would be safe for him right now in the setting of pancreatitis. We need to give his pancreas a rest at least for now, and this is best done with insulin. Also, he may benefit from the weight gaining effect confered by insulin. I suggested to start long-acting insulin, Lantus, low dose, taken in a.m., to avoid overnight hypoglycemia. He will keep his Humalog at hand, but ideally he would not need to use it often. If he  does, then we will need to think about starting mealtime insulin. I am not going to increase his metformin as he is complaining of diarrhea, but I think he should continue with a half maximal dose, 500 mg twice a day. - I suggested to:  Patient Instructions  Please continue: - Metformin 500 mg 2x a day  Please start Lantus 6 units in am.  Continue Humalog, but change the doses as follows: 201-250: + 1 units 251-300: + 2 units 301-350: + 3 units >350: + 4 units  Please return in 1-1.5 month with your sugar log.   Please call me in the next 1-2 weeks with your sugars.  - continue checking sugars at different times of the day - check 2-3 times a day, rotating checks - given sugar log and advised how to fill it and to bring it at next appt  - given foot care handout and explained the principles  - given instructions for hypoglycemia management "15-15 rule"  - advised for yearly eye exams >> he is UTD - checked HbA1c today >> 6.9% - Return to clinic in 1.5 mo with sugar log

## 2015-05-01 NOTE — Progress Notes (Signed)
PCP is Kirstie Peri, MD Referring Provider is Kirstie Peri, MD  Chief Complaint  Patient presents with  . Routine Post Op    6 week f/u with CXR    HPI: 10 week followup after urgent multivessel CABG following non-ST elevation MI in the setting of gallstone pancreatitis in shock. Patient's EF is approximate 40%. He is currently in the process of completing outpatient cardiac rehabilitation. He is getting stronger. He denies angina or symptoms of CHF. The surgical incisions are well-healed. He has maintained a sinus rhythm. He has been followed by his cardiologist Dr. Darl Householder [place].patient is on Coumadin for DVT-pulmonary emboli at the time of his acute illness and CABG  The patient still has gallstones in the gallbladder and common duct. The patient is being followed by Dr. Leone Payor of GI and Dr. Daphine Deutscher of Gen. Surgery. CT scan of the abdomen last month showed the pancreas to still be edematous. He has trouble gaining weight. He has trouble with loose stools. He denies abdominal pain or biliary colic. He denies fever.  The patients cardiac status should be adequate for general anesthesia and gallbladder-biliary procedures after October 1 1 he will be finish with outpatient cardiac rehabilitation.  The patient's vena cava filter should remain in place until after his cholecystectomy-common duct exploration. He'll need to stop his Coumadin prior to surgical procedures.  Past Medical History  Diagnosis Date  . Hypertension   . Diabetes mellitus type 2 in obese   . Cholelithiasis   . DVT (deep venous thrombosis)   . Cataract   . GERD (gastroesophageal reflux disease)   . MI (myocardial infarction) 01/2015  . Pancreatitis 01/2015    Past Surgical History  Procedure Laterality Date  . Esophagogastroduodenoscopy N/A 01/21/2015    Procedure: ESOPHAGOGASTRODUODENOSCOPY (EGD);  Surgeon: Hilarie Fredrickson, MD;  Location: [facility] ENDOSCOPY;  Service: Endoscopy;  Laterality: N/A;  to be done at BEDSIDE   . Cardiac catheterization N/A 01/17/2015    Procedure: Left Heart Cath and Coronary Angiography;  Surgeon: Lyn Records, MD;  Location: [facility] INVASIVE CV LAB;  Service: Cardiovascular;  Laterality: N/A;  . Esophagogastroduodenoscopy N/A 02/05/2015    Procedure: ESOPHAGOGASTRODUODENOSCOPY (EGD);  Surgeon: Hart Carwin, MD;  Location: [facility] ENDOSCOPY;  Service: Endoscopy;  Laterality: N/A;  . Coronary artery bypass graft N/A 02/14/2015    Procedure: CORONARY ARTERY BYPASS GRAFTING (CABG) x three, using left internal mammary artery and left leg greater saphenous vein harvested endoscopically;  Surgeon: Kerin Perna, MD;  Location: [facility] OR;  Service: Open Heart Surgery;  Laterality: N/A;  . Tee without cardioversion N/A 02/14/2015    Procedure: TRANSESOPHAGEAL ECHOCARDIOGRAM (TEE);  Surgeon: Kerin Perna, MD;  Location: [facility] OR;  Service: Open Heart Surgery;  Laterality: N/A;    Family History  Problem Relation Age of Onset  . Heart attack Brother 55  . Pancreatic cancer Sister 69    died in her 58s.     Social History Social History  Substance Use Topics  . Smoking status: Never Smoker   . Smokeless tobacco: Never Used  . Alcohol Use: No    Current Outpatient Prescriptions  Medication Sig Dispense Refill  . aspirin EC 81 MG EC tablet Take 1 tablet (81 mg total) by mouth daily.    Marland Kitchen atorvastatin (LIPITOR) 40 MG tablet Take 1 tablet (40 mg total) by mouth daily at 6 PM. 30 tablet 3  . Insulin Glargine (LANTUS SOLOSTAR) 100 UNIT/ML Solostar Pen Inject 6 Units into the skin daily  before breakfast. 5 pen 1  . insulin lispro (HUMALOG KWIKPEN) 100 UNIT/ML KiwkPen Inject 2-10 Units into the skin 3 (three) times daily. Per sliding scale for blood sugar > 200 units    . latanoprost (XALATAN) 0.005 % ophthalmic solution Place 1 drop into both eyes at bedtime.    . lipase/protease/amylase (CREON) 36000 UNITS CPEP capsule Take 1 capsule (36,000 Units total) by mouth 3 (three) times daily before meals. And  with snacks 150 capsule 3  . lisinopril (PRINIVIL,ZESTRIL) 5 MG tablet Take 1 tablet (5 mg total) by mouth daily. 90 tablet 3  . metFORMIN (GLUCOPHAGE) 500 MG tablet Take 500 mg by mouth 2 (two) times daily with a meal.    . metoprolol succinate (TOPROL XL) 25 MG 24 hr tablet Take 0.5 tablets (12.5 mg total) by mouth 2 (two) times daily. 30 tablet 6  . mirtazapine (REMERON) 7.5 MG tablet Take 1 tablet (7.5 mg total) by mouth at bedtime. 30 tablet 3  . pantoprazole (PROTONIX) 40 MG tablet Take 1 tablet (40 mg total) by mouth daily before breakfast. 30 tablet 11  . warfarin (COUMADIN) 2.5 MG tablet Take 1 tablet daily except 1 1/2 tablets on Mondays or as directed by coumadin clinic 45 tablet 3   No current facility-administered medications for this visit.    No Known Allergies  Review of Systems   Patient anxious to start doing some activities and yard. He can use the riding mowe  and can lift up to 20 pounds but no more.  BP 111/69 mmHg  Pulse 96  Resp 20  Ht  (1.803 m)  Wt 138 lb (62.596 kg)  BMI 19.26 kg/m2  SpO2 94% Physical Exam Thin somewhat hyper active Caucasian male, No distress Lungs clear with tubular breath sounds at left base Heart rhythm regular no murmur or gallop Surgical sternotomy incision well-healed No abdominal tenderness No pedal edema   Diagnostic Tests: Chest x-ray clear with some scarring at the left costophrenic angle  Impression: Excellent recovery after urgent CABG-continue to complete outpatient cardiac rehabilitation Patient's cardiac status should be adequate for general anesthesia and surgical procedures on his biliary tract after October 1.  Plan:we'll see the patient back in followup in approximately 3 months. He will need to be referred back to interventional radiology for removal of the vena cava filter when appropriate.   Mikey Bussing, MD [facility] [phone]

## 2015-05-01 NOTE — Patient Instructions (Addendum)
Please continue: - Metformin 500 mg 2x a day  Please start Lantus 6 units in am.  Continue Humalog, but change the doses as follows: 201-250: + 1 units 251-300: + 2 units 301-350: + 3 units >350: + 4 units  Please return in 1-1.5 month with your sugar log.   Please call me in the next 1-2 weeks with your sugars.  PATIENT INSTRUCTIONS FOR TYPE 2 DIABETES:  **Please join MyChart!** - see attached instructions about how to join if you have not done so already.  DIET AND EXERCISE Diet and exercise is an important part of diabetic treatment.  We recommended aerobic exercise in the form of brisk walking (working between 40-60% of maximal aerobic capacity, similar to brisk walking) for 150 minutes per week (such as 30 minutes five days per week) along with 3 times per week performing 'resistance' training (using various gauge rubber tubes with handles) 5-10 exercises involving the major muscle groups (upper body, lower body and core) performing 10-15 repetitions (or near fatigue) each exercise. Start at half the above goal but build slowly to reach the above goals. If limited by weight, joint pain, or disability, we recommend daily walking in a swimming pool with water up to waist to reduce pressure from joints while allow for adequate exercise.    BLOOD GLUCOSES Monitoring your blood glucoses is important for continued management of your diabetes. Please check your blood glucoses 2-4 times a day: fasting, before meals and at bedtime (you can rotate these measurements - e.g. one day check before the 3 meals, the next day check before 2 of the meals and before bedtime, etc.).   HYPOGLYCEMIA (low blood sugar) Hypoglycemia is usually a reaction to not eating, exercising, or taking too much insulin/ other diabetes drugs.  Symptoms include tremors, sweating, hunger, confusion, headache, etc. Treat IMMEDIATELY with 15 grams of Carbs: . 4 glucose tablets .  cup regular juice/soda . 2 tablespoons  raisins . 4 teaspoons sugar . 1 tablespoon honey Recheck blood glucose in 15 mins and repeat above if still symptomatic/blood glucose <100.  RECOMMENDATIONS TO REDUCE YOUR RISK OF DIABETIC COMPLICATIONS: * Take your prescribed MEDICATION(S) * Follow a DIABETIC diet: Complex carbs, fiber rich foods, (monounsaturated and polyunsaturated) fats * AVOID saturated/trans fats, high fat foods, >2,300 mg salt per day. * EXERCISE at least 5 times a week for 30 minutes or preferably daily.  * DO NOT SMOKE OR DRINK more than 1 drink a day. * Check your FEET every day. Do not wear tightfitting shoes. Contact us if you develop an ulcer * See your EYE doctor once a year or more if needed * Get a FLU shot once a year * Get a PNEUMONIA vaccine once before and once after age 36 years  GOALS:  * Your Hemoglobin A1c of <7%  * fasting sugars need to be <130 * after meals sugars need to be <180 (2h after you start eating) * Your Systolic BP should be 140 or lower  * Your Diastolic BP should be 80 or lower  * Your HDL (Good Cholesterol) should be 40 or higher  * Your LDL (Bad Cholesterol) should be 100 or lower. * Your Triglycerides should be 150 or lower  * Your Urine microalbumin (kidney function) should be <30 * Your Body Mass Index should be 25 or lower    Please consider the following ways to cut down carbs and fat and increase fiber and micronutrients in your diet: - substitute whole grain for  white bread or pasta - substitute brown rice for white rice - substitute 90-calorie flat bread pieces for slices of bread when possible - substitute sweet potatoes or yams for white potatoes - substitute humus for margarine - substitute tofu for cheese when possible - substitute almond or rice milk for regular milk (would not drink soy milk daily due to concern for soy estrogen influence on breast cancer risk) - substitute dark chocolate for other sweets when possible - substitute water - can add lemon or  orange slices for taste - for diet sodas (artificial sweeteners will trick your body that you can eat sweets without getting calories and will lead you to overeating and weight gain in the long run) - do not skip breakfast or other meals (this will slow down the metabolism and will result in more weight gain over time)  - can try smoothies made from fruit and almond/rice milk in am instead of regular breakfast - can also try old-fashioned (not instant) oatmeal made with almond/rice milk in am - order the dressing on the side when eating salad at a restaurant (pour less than half of the dressing on the salad) - eat as little meat as possible - can try juicing, but should not forget that juicing will get rid of the fiber, so would alternate with eating raw veg./fruits or drinking smoothies - use as little oil as possible, even when using olive oil - can dress a salad with a mix of balsamic vinegar and lemon juice, for e.g. - use agave nectar, stevia sugar, or regular sugar rather than artificial sweateners - steam or broil/roast veggies  - snack on veggies/fruit/nuts (unsalted, preferably) when possible, rather than processed foods - reduce or eliminate aspartame in diet (it is in diet sodas, chewing gum, etc) Read the labels!  Try to read Dr. Janene Harvey book: "Program for Reversing Diabetes" for other ideas for healthy eating.

## 2015-05-03 ENCOUNTER — Encounter (HOSPITAL_COMMUNITY)
Admission: RE | Admit: 2015-05-03 | Discharge: 2015-05-03 | Disposition: A | Discharge status: Home / Self Care | Payer: Medicare Other | Source: Ambulatory Visit | Attending: Cardiovascular Disease | Admitting: Cardiovascular Disease

## 2015-05-03 ENCOUNTER — Telehealth: Payer: Self-pay | Admitting: Internal Medicine

## 2015-05-03 DIAGNOSIS — I252 Old myocardial infarction: Secondary | ICD-10-CM | POA: Diagnosis not present

## 2015-05-03 NOTE — Telephone Encounter (Signed)
Daughter calling back with an additional question

## 2015-05-03 NOTE — Telephone Encounter (Signed)
Daughter needs insulin dosage clarification please, what is he supposed to do at lunch time is it sliding scale? For the novolog?

## 2015-05-03 NOTE — Telephone Encounter (Signed)
Answered the daughters questions. Pt is to continue on the insulin with the sliding scale as the guide. She voiced understanding.

## 2015-05-06 ENCOUNTER — Encounter (HOSPITAL_COMMUNITY)
Admission: RE | Admit: 2015-05-06 | Discharge: 2015-05-06 | Disposition: A | Discharge status: Home / Self Care | Payer: Medicare Other | Source: Ambulatory Visit | Attending: Cardiovascular Disease | Admitting: Cardiovascular Disease

## 2015-05-06 DIAGNOSIS — I252 Old myocardial infarction: Secondary | ICD-10-CM | POA: Diagnosis not present

## 2015-05-08 ENCOUNTER — Encounter (HOSPITAL_COMMUNITY)
Admission: RE | Admit: 2015-05-08 | Discharge: 2015-05-08 | Disposition: A | Discharge status: Home / Self Care | Payer: Medicare Other | Source: Ambulatory Visit | Attending: Cardiovascular Disease | Admitting: Cardiovascular Disease

## 2015-05-08 ENCOUNTER — Telehealth: Payer: Self-pay | Admitting: Internal Medicine

## 2015-05-08 DIAGNOSIS — I252 Old myocardial infarction: Secondary | ICD-10-CM | POA: Diagnosis not present

## 2015-05-08 NOTE — Telephone Encounter (Signed)
Please read message below and advise. Thank you.  

## 2015-05-08 NOTE — Telephone Encounter (Signed)
Pt has blood sugars to call in: 8/25 before breakfast 113, lunch 182, dinner 169 8/26 AM 133, lunch 164, PM 196 8/27 AM 87, lunch 100, PM 117 8/28 AM 99, lunch 172, PM 142 8/29 AM 85, lunch 174, PM 172 8/30 AM 82, lunch 174,PM 179 8/31 AM 84, lunch 102

## 2015-05-08 NOTE — Telephone Encounter (Signed)
These are much improved! Continue current dose of Lantus + metformin (+ SSI as needed) and let me know about the sugars again after the weekend.

## 2015-05-08 NOTE — Telephone Encounter (Signed)
Called pt and advised him per Dr Gherghe's message below. Pt voiced understanding.  

## 2015-05-10 ENCOUNTER — Encounter (HOSPITAL_COMMUNITY)
Admission: RE | Admit: 2015-05-10 | Discharge: 2015-05-10 | Disposition: A | Discharge status: Home / Self Care | Payer: Medicare Other | Source: Ambulatory Visit | Attending: Cardiovascular Disease | Admitting: Cardiovascular Disease

## 2015-05-10 DIAGNOSIS — I252 Old myocardial infarction: Secondary | ICD-10-CM | POA: Diagnosis present

## 2015-05-10 DIAGNOSIS — Z951 Presence of aortocoronary bypass graft: Secondary | ICD-10-CM | POA: Insufficient documentation

## 2015-05-13 ENCOUNTER — Encounter (HOSPITAL_COMMUNITY): Payer: Medicare Other

## 2015-05-15 ENCOUNTER — Encounter (HOSPITAL_COMMUNITY)
Admission: RE | Admit: 2015-05-15 | Discharge: 2015-05-15 | Disposition: A | Discharge status: Home / Self Care | Payer: Medicare Other | Source: Ambulatory Visit | Attending: Cardiovascular Disease | Admitting: Cardiovascular Disease

## 2015-05-15 DIAGNOSIS — I252 Old myocardial infarction: Secondary | ICD-10-CM | POA: Diagnosis not present

## 2015-05-15 NOTE — Progress Notes (Signed)
Cardiac Rehabilitation Program Outcomes Report   Orientation:  03/21/15 Graduate Date:  tbd Discharge Date:  tbd # of sessions completed: 18  Cardiologist: Margaretmary Lombard MD:  Leona Singleton Time:  0930  A.  Exercise Program:  Tolerates exercise @ 4.00 METS for 15 minutes  B.  Mental Health:  Good mental attitude  C.  Education/Instruction/Skills  Accurately checks own pulse.  Rest:  90  Exercise:  97 and Knows THR for exercise  Uses Perceived Exertion Scale and/or Dyspnea Scale  D.  Nutrition/Weight Control/Body Composition:  Adherence to prescribed nutrition program: good    E.  Blood Lipids    Lab Results  Component Value Date   CHOL 104 01/15/2015   HDL 26* 01/15/2015   LDLCALC 64 01/15/2015   TRIG 195* 01/19/2015   CHOLHDL 4.0 01/15/2015    F.  Lifestyle Changes:  Making positive lifestyle changes  G.  Symptoms noted with exercise:  Asymptomatic  Report Completed By: Doretha Sou RN   Comments:  This is patients halfway progress note for [facility].  Patient is doing very well in program.

## 2015-05-16 ENCOUNTER — Telehealth: Payer: Self-pay | Admitting: *Deleted

## 2015-05-16 NOTE — Telephone Encounter (Signed)
Pt's daughter called with sugar readings: 8/31    9/1    9/2    9/3 173  Before dinner  131 Before breakfast 121 Before breakfast 107 Before breakfast      155 Before lunch  186 Before lunch  189 Before lunch     185 Before dinner  169 Before dinner  129 Before dinner  9/4    9/5     9/6    9/7 111 Before breakfast 144  Before breakfast  126 Before breakfast 101  Before breakfast 198 Before lunch  181 Before lunch   156 Before lunch  166 Before lunch 195 Before dinner  195 7:45 (2 hrs after dinner) 184 Before dinner  She wants to know if he needs to continue to use the sliding scale. Pt has not had to use it. Please advise.

## 2015-05-16 NOTE — Telephone Encounter (Signed)
Sugars before lunch and before dinner are higher than target. Let's try to add Humalog 2 units before breakfast and lunch. He should add the SSI of Humalog to this dose.  Please check some sugars at bedtime to let me know about them in several days.

## 2015-05-16 NOTE — Telephone Encounter (Signed)
Called pt and lvm advising him per Dr Gherghe's message below.  

## 2015-05-17 ENCOUNTER — Ambulatory Visit (INDEPENDENT_AMBULATORY_CARE_PROVIDER_SITE_OTHER): Payer: Medicare Other | Admitting: *Deleted

## 2015-05-17 ENCOUNTER — Encounter (HOSPITAL_COMMUNITY)
Admission: RE | Admit: 2015-05-17 | Discharge: 2015-05-17 | Disposition: A | Discharge status: Home / Self Care | Payer: Medicare Other | Source: Ambulatory Visit | Attending: Cardiovascular Disease | Admitting: Cardiovascular Disease

## 2015-05-17 DIAGNOSIS — I82409 Acute embolism and thrombosis of unspecified deep veins of unspecified lower extremity: Secondary | ICD-10-CM

## 2015-05-17 DIAGNOSIS — I252 Old myocardial infarction: Secondary | ICD-10-CM | POA: Diagnosis not present

## 2015-05-17 LAB — POCT INR: INR: 1.7

## 2015-05-20 ENCOUNTER — Encounter (HOSPITAL_COMMUNITY)
Admission: RE | Admit: 2015-05-20 | Discharge: 2015-05-20 | Disposition: A | Discharge status: Home / Self Care | Payer: Medicare Other | Source: Ambulatory Visit | Attending: Cardiovascular Disease | Admitting: Cardiovascular Disease

## 2015-05-20 DIAGNOSIS — I252 Old myocardial infarction: Secondary | ICD-10-CM | POA: Diagnosis not present

## 2015-05-22 ENCOUNTER — Encounter (HOSPITAL_COMMUNITY): Payer: Medicare Other

## 2015-05-24 ENCOUNTER — Encounter (HOSPITAL_COMMUNITY)
Admission: RE | Admit: 2015-05-24 | Discharge: 2015-05-24 | Disposition: A | Discharge status: Home / Self Care | Payer: Medicare Other | Source: Ambulatory Visit | Attending: Cardiovascular Disease | Admitting: Cardiovascular Disease

## 2015-05-24 DIAGNOSIS — I252 Old myocardial infarction: Secondary | ICD-10-CM | POA: Diagnosis not present

## 2015-05-27 ENCOUNTER — Encounter (HOSPITAL_COMMUNITY)
Admission: RE | Admit: 2015-05-27 | Discharge: 2015-05-27 | Disposition: A | Discharge status: Home / Self Care | Payer: Medicare Other | Source: Ambulatory Visit | Attending: Cardiovascular Disease | Admitting: Cardiovascular Disease

## 2015-05-27 DIAGNOSIS — I252 Old myocardial infarction: Secondary | ICD-10-CM | POA: Diagnosis not present

## 2015-05-29 ENCOUNTER — Encounter (HOSPITAL_COMMUNITY)
Admission: RE | Admit: 2015-05-29 | Discharge: 2015-05-29 | Disposition: A | Discharge status: Home / Self Care | Payer: Medicare Other | Source: Ambulatory Visit | Attending: Cardiovascular Disease | Admitting: Cardiovascular Disease

## 2015-05-29 DIAGNOSIS — I252 Old myocardial infarction: Secondary | ICD-10-CM | POA: Diagnosis not present

## 2015-05-30 ENCOUNTER — Other Ambulatory Visit: Payer: Self-pay | Admitting: Surgery

## 2015-05-30 ENCOUNTER — Ambulatory Visit: Payer: Self-pay | Admitting: Internal Medicine

## 2015-05-30 DIAGNOSIS — K859 Acute pancreatitis without necrosis or infection, unspecified: Secondary | ICD-10-CM

## 2015-05-30 DIAGNOSIS — K8020 Calculus of gallbladder without cholecystitis without obstruction: Secondary | ICD-10-CM

## 2015-05-31 ENCOUNTER — Encounter (HOSPITAL_COMMUNITY)
Admission: RE | Admit: 2015-05-31 | Discharge: 2015-05-31 | Disposition: A | Discharge status: Home / Self Care | Payer: Medicare Other | Source: Ambulatory Visit | Attending: Cardiovascular Disease | Admitting: Cardiovascular Disease

## 2015-05-31 DIAGNOSIS — I252 Old myocardial infarction: Secondary | ICD-10-CM | POA: Diagnosis not present

## 2015-06-03 ENCOUNTER — Encounter: Payer: Self-pay | Admitting: Cardiovascular Disease

## 2015-06-03 ENCOUNTER — Encounter (HOSPITAL_COMMUNITY): Payer: Medicare Other

## 2015-06-03 ENCOUNTER — Ambulatory Visit (INDEPENDENT_AMBULATORY_CARE_PROVIDER_SITE_OTHER): Payer: Medicare Other | Admitting: Cardiovascular Disease

## 2015-06-03 VITALS — BP 146/79 | HR 91 | Ht 72.0 in | Wt 133.0 lb

## 2015-06-03 DIAGNOSIS — I82409 Acute embolism and thrombosis of unspecified deep veins of unspecified lower extremity: Secondary | ICD-10-CM

## 2015-06-03 DIAGNOSIS — I25810 Atherosclerosis of coronary artery bypass graft(s) without angina pectoris: Secondary | ICD-10-CM

## 2015-06-03 DIAGNOSIS — E785 Hyperlipidemia, unspecified: Secondary | ICD-10-CM

## 2015-06-03 DIAGNOSIS — Z7182 Exercise counseling: Secondary | ICD-10-CM

## 2015-06-03 DIAGNOSIS — K819 Cholecystitis, unspecified: Secondary | ICD-10-CM

## 2015-06-03 DIAGNOSIS — Z951 Presence of aortocoronary bypass graft: Secondary | ICD-10-CM

## 2015-06-03 DIAGNOSIS — Z719 Counseling, unspecified: Secondary | ICD-10-CM

## 2015-06-03 DIAGNOSIS — I10 Essential (primary) hypertension: Secondary | ICD-10-CM

## 2015-06-03 DIAGNOSIS — R0989 Other specified symptoms and signs involving the circulatory and respiratory systems: Secondary | ICD-10-CM

## 2015-06-03 DIAGNOSIS — Z7901 Long term (current) use of anticoagulants: Secondary | ICD-10-CM

## 2015-06-03 NOTE — Patient Instructions (Addendum)
Your physician recommends that you continue on your current medications as directed. Please refer to the Current Medication list given to you today. Your physician has requested that you have a carotid duplex. This test is an ultrasound of the carotid arteries in your neck. It looks at blood flow through these arteries that supply the brain with blood. Allow one hour for this exam. There are no restrictions or special instructions. Your physician recommends that you schedule a follow-up appointment in: 4-5 months. You will receive a reminder letter in the mail in about 2 months reminding you to call and schedule your appointment. If you don't receive this letter, please contact our office.

## 2015-06-03 NOTE — Addendum Note (Signed)
Addended by: Eustace Moore on: 06/03/2015 11:46 AM   Modules accepted: Orders

## 2015-06-03 NOTE — Progress Notes (Signed)
Patient ID: [name] [name], male   DOB: [birth year], 74 y.o.   MRN: [id number]      SUBJECTIVE: The patient presents for routine cardiovascular follow-up. He has a history of CABG, non-STEMI, and chronic systolic heart failure, as well as DVT. He also has a history of gallstone pancreatitis and will likely soon be undergoing a procedure for this. He will eventually undergo IVC filter removal by interventional radiology after his gallbladder/biliary procedure.  He has done well with cardiac rehabilitation with normal blood pressure readings during these sessions. He denies exertional chest pain, shortness of breath, palpitations, and leg swelling. He is interested in exercising at the [facility] once cardiac rehabilitation finishes. He would also like to garden. He has been instructed not to lift anything over 5 pounds by his surgeon.  He is here with his wife and his daughter from [place].  Review of Systems: As per "subjective", otherwise negative.  No Known Allergies  Current Outpatient Prescriptions  Medication Sig Dispense Refill  . aspirin EC 81 MG EC tablet Take 1 tablet (81 mg total) by mouth daily.    Marland Kitchen atorvastatin (LIPITOR) 40 MG tablet Take 1 tablet (40 mg total) by mouth daily at 6 PM. 30 tablet 3  . insulin lispro (HUMALOG KWIKPEN) 100 UNIT/ML KiwkPen Inject 2-10 Units into the skin 3 (three) times daily. Per sliding scale for blood sugar > 200 units    . LANTUS SOLOSTAR 100 UNIT/ML Solostar Pen Inject 8 Units into the skin daily.    Marland Kitchen latanoprost (XALATAN) 0.005 % ophthalmic solution Place 1 drop into both eyes at bedtime.    . lipase/protease/amylase (CREON) 36000 UNITS CPEP capsule Take 1 capsule (36,000 Units total) by mouth 3 (three) times daily before meals. And with snacks 150 capsule 3  . lisinopril (PRINIVIL,ZESTRIL) 5 MG tablet Take 1 tablet (5 mg total) by mouth daily. 90 tablet 3  . metFORMIN (GLUCOPHAGE) 500 MG tablet Take 500 mg by mouth 2 (two) times daily with a meal.     . metoprolol succinate (TOPROL XL) 25 MG 24 hr tablet Take 0.5 tablets (12.5 mg total) by mouth 2 (two) times daily. 30 tablet 6  . pantoprazole (PROTONIX) 40 MG tablet Take 1 tablet (40 mg total) by mouth daily before breakfast. 30 tablet 11  . warfarin (COUMADIN) 2.5 MG tablet Take 1 tablet daily except 1 1/2 tablets on Mondays or as directed by coumadin clinic 45 tablet 3   No current facility-administered medications for this visit.    Past Medical History  Diagnosis Date  . Hypertension   . Diabetes mellitus type 2 in obese   . Cholelithiasis   . DVT (deep venous thrombosis)   . Cataract   . GERD (gastroesophageal reflux disease)   . MI (myocardial infarction) 01/2015  . Pancreatitis 01/2015    Past Surgical History  Procedure Laterality Date  . Esophagogastroduodenoscopy N/A 01/21/2015    Procedure: ESOPHAGOGASTRODUODENOSCOPY (EGD);  Surgeon: Hilarie Fredrickson, MD;  Location: [facility] ENDOSCOPY;  Service: Endoscopy;  Laterality: N/A;  to be done at BEDSIDE  . Cardiac catheterization N/A 01/17/2015    Procedure: Left Heart Cath and Coronary Angiography;  Surgeon: Lyn Records, MD;  Location: [facility] INVASIVE CV LAB;  Service: Cardiovascular;  Laterality: N/A;  . Esophagogastroduodenoscopy N/A 02/05/2015    Procedure: ESOPHAGOGASTRODUODENOSCOPY (EGD);  Surgeon: Hart Carwin, MD;  Location: [facility] ENDOSCOPY;  Service: Endoscopy;  Laterality: N/A;  . Coronary artery bypass graft N/A 02/14/2015    Procedure: CORONARY ARTERY  BYPASS GRAFTING (CABG) x three, using left internal mammary artery and left leg greater saphenous vein harvested endoscopically;  Surgeon: Kerin Perna, MD;  Location: [facility] OR;  Service: Open Heart Surgery;  Laterality: N/A;  . Tee without cardioversion N/A 02/14/2015    Procedure: TRANSESOPHAGEAL ECHOCARDIOGRAM (TEE);  Surgeon: Kerin Perna, MD;  Location: [facility] OR;  Service: Open Heart Surgery;  Laterality: N/A;    Social History   Social History  . Marital Status: Married     Spouse Name: N/A  . Number of Children: N/A  . Years of Education: N/A   Occupational History  . Not on file.   Social History Main Topics  . Smoking status: Never Smoker   . Smokeless tobacco: Never Used  . Alcohol Use: No  . Drug Use: No  . Sexual Activity: Yes   Other Topics Concern  . Not on file   Social History Narrative     Filed Vitals:   06/03/15 1100  BP: 146/79  Pulse: 91  Height: 6' (1.829 m)  Weight: 133 lb (60.328 kg)    PHYSICAL EXAM General: NAD HEENT: Normal. Neck: No JVD, no thyromegaly. Lungs: Clear to auscultation bilaterally with normal respiratory effort. CV: Nondisplaced PMI.  Regular rate and rhythm, normal S1/S2, no S3/S4, no murmur. No pretibial or periankle edema.  Left carotid bruit.  Normal pedal pulses.  Abdomen: Soft, nontender, no distention.  Neurologic: Alert and oriented x 3.  Psych: Normal affect. Skin: Normal. Musculoskeletal: No gross deformities. Extremities: No clubbing or cyanosis.   ECG: Most recent ECG reviewed.      ASSESSMENT AND PLAN: 1. CAD with 3-vessel CABG and non-STEMI: Symptomatically stable. Will continue aspirin, Lipitor, lisinopril, and metoprolol succinate. Counseling given no exercise therapy.  2. Chronic systolic heart failure/ischemic cardiomyopathy, EF 35-40%: Euvolemic on Lasix 40 mg daily. Continue lisinopril and metoprolol succinate.  3. 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 Will eventually undergo IVC filter removal by interventional radiology.  4. Hyperlipidemia: On Lipitor 40 mg.  5. Essential HTN: Mildly elevated today but usually controlled. No changes.  6. Left carotid bruit: Will check Dopplers before next visit.  Dispo: f/u 4-5 months.  Prentice Docker, M.D., F.A.C.C.

## 2015-06-05 ENCOUNTER — Encounter (HOSPITAL_COMMUNITY)
Admission: RE | Admit: 2015-06-05 | Discharge: 2015-06-05 | Disposition: A | Discharge status: Home / Self Care | Payer: Medicare Other | Source: Ambulatory Visit | Attending: Cardiovascular Disease | Admitting: Cardiovascular Disease

## 2015-06-05 ENCOUNTER — Ambulatory Visit (INDEPENDENT_AMBULATORY_CARE_PROVIDER_SITE_OTHER): Payer: Medicare Other | Admitting: *Deleted

## 2015-06-05 DIAGNOSIS — I82409 Acute embolism and thrombosis of unspecified deep veins of unspecified lower extremity: Secondary | ICD-10-CM

## 2015-06-05 DIAGNOSIS — I252 Old myocardial infarction: Secondary | ICD-10-CM | POA: Diagnosis not present

## 2015-06-05 LAB — POCT INR: INR: 1.8

## 2015-06-06 ENCOUNTER — Ambulatory Visit: Payer: Self-pay | Admitting: Internal Medicine

## 2015-06-06 ENCOUNTER — Other Ambulatory Visit: Payer: Self-pay | Admitting: Surgery

## 2015-06-06 ENCOUNTER — Ambulatory Visit
Admission: RE | Admit: 2015-06-06 | Discharge: 2015-06-06 | Disposition: A | Discharge status: Home / Self Care | Payer: Medicare Other | Source: Ambulatory Visit | Attending: Surgery | Admitting: Surgery

## 2015-06-06 DIAGNOSIS — K668 Other specified disorders of peritoneum: Secondary | ICD-10-CM

## 2015-06-06 MED ORDER — IOPAMIDOL (ISOVUE-300) INJECTION 61%: 100.0000 mL | Freq: Once | INTRAVENOUS | Status: DC | PRN

## 2015-06-07 ENCOUNTER — Encounter (HOSPITAL_COMMUNITY)
Admission: RE | Admit: 2015-06-07 | Discharge: 2015-06-07 | Disposition: A | Discharge status: Home / Self Care | Payer: Medicare Other | Source: Ambulatory Visit | Attending: Cardiovascular Disease | Admitting: Cardiovascular Disease

## 2015-06-07 ENCOUNTER — Telehealth: Payer: Self-pay | Admitting: Internal Medicine

## 2015-06-07 ENCOUNTER — Telehealth: Payer: Self-pay | Admitting: Surgery

## 2015-06-07 DIAGNOSIS — I252 Old myocardial infarction: Secondary | ICD-10-CM | POA: Diagnosis not present

## 2015-06-07 NOTE — Telephone Encounter (Signed)
I called Ms. [name] [name] back regarding questions about percutaneous biopsy.  I spoke with Dr. Layne Benton and reviewed the CT.  I explained the reason for the biopsy and that IR would be contacting them.  Will followup after the biopsy.  [name]

## 2015-06-07 NOTE — Telephone Encounter (Signed)
Dr. Leone Payor notifed.

## 2015-06-10 ENCOUNTER — Encounter (HOSPITAL_COMMUNITY)
Admission: RE | Admit: 2015-06-10 | Discharge: 2015-06-10 | Disposition: A | Discharge status: Home / Self Care | Payer: Medicare Other | Source: Ambulatory Visit | Attending: Cardiovascular Disease | Admitting: Cardiovascular Disease

## 2015-06-10 DIAGNOSIS — Z951 Presence of aortocoronary bypass graft: Secondary | ICD-10-CM | POA: Diagnosis not present

## 2015-06-10 DIAGNOSIS — I252 Old myocardial infarction: Secondary | ICD-10-CM | POA: Insufficient documentation

## 2015-06-12 ENCOUNTER — Encounter (HOSPITAL_COMMUNITY): Payer: Medicare Other

## 2015-06-13 ENCOUNTER — Encounter: Payer: Self-pay | Admitting: Internal Medicine

## 2015-06-13 ENCOUNTER — Ambulatory Visit (INDEPENDENT_AMBULATORY_CARE_PROVIDER_SITE_OTHER): Payer: Medicare Other | Admitting: Internal Medicine

## 2015-06-13 VITALS — BP 132/80 | HR 89 | Temp 97.4°F | Resp 12 | Wt 130.4 lb

## 2015-06-13 DIAGNOSIS — E1165 Type 2 diabetes mellitus with hyperglycemia: Secondary | ICD-10-CM

## 2015-06-13 DIAGNOSIS — E1159 Type 2 diabetes mellitus with other circulatory complications: Secondary | ICD-10-CM | POA: Diagnosis not present

## 2015-06-13 NOTE — Patient Instructions (Addendum)
Patient Instructions  Please increase Metformin to 500 mg 3x a day, with meals. Continue Lantus 8 units in am. Continue NovoLog Sliding scale, but change to: 150-200: + 1 unit 201-250: + 2 units 251-300: + 3 units > 300: + 4 units  Please call me in 2 weeks with your sugars.  Please come back for a follow-up appointment in 3 months.

## 2015-06-13 NOTE — Progress Notes (Signed)
Patient ID: [name] [name], male   DOB: [birth year], 74 y.o.   MRN: [id number]  HPI: [name] [name] is a 74 y.o.-year-old male, initially referred by his gastroenterologist, Dr Leone Payor, returning for follow-up for DM2, dx in ~2006, insulin-dependent, uncontrolled, with complications (CAD, s/p AMI, s/p CABG; CKD). Last visit 1.5 mo ago. He is here with his wife and daughter, who offer part of the history.  Reviewed hx: Patient was admitted in 01/2015 with acute biliary pancreatitis secondary to gallstones and he had a very complicated hospitalization. He had an MI and had to have 3 vessel CABG during that hospitalization. He also developed esophagitis with GI bleeding. He is now slowly improving and continues to see Dr. Leone Payor for his pancreatitis. On the latest CT scan, there was a peripancreatic fluid collection, ? Necrosis. He has diarrhea >> started Creon >> a little better.   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 We reviewed this together with patient and his family. I explained that I do not believe at this point that he produces enough insulin to maintain his blood sugars and we will need to continue exogenous insulin.  Last hemoglobin A1c was: Lab Results  Component Value Date   HGBA1C 6.9 05/01/2015   HGBA1C 7.5* 01/13/2015   Pt is on a regimen of: - Metformin 500 mg 2x a day, with meals (losing weight on a higher dose) - Lantus 8 units in am - Humalog SSI units 3x a day, before meals - if sugars >200 He was on Glipizide before the pancreatitis. He was on Januvia 6 mo ago.  Pt checks his sugars 3x a day and they are: - am: 108, 122-170, 180, 220 - 2h after b'fast: n/c - before lunch: 187-271 - 2h after lunch: n/c - before dinner: 113-236 - 2h after dinner: n/c - bedtime: n/c - nighttime: n/c No lows. Lowest sugar was 70 (on  Glipizide) >> now 80s; he has hypoglycemia awareness at 70.  Highest sugar was 270s >> 271.  Glucometer: ReliOn  Pt's meals are: - Breakfast: cereal + strawberries (Special K) + oatmeal; toast + eggs - Lunch: potatoes, casseroles, chicken - Dinner: sandwich or soup, used to eat out - Snacks: yoghurt, cheese; no snacks at night  - + CKD, last BUN/creatinine:  Lab Results  Component Value Date   BUN 22 04/04/2015   CREATININE 1.41 04/04/2015  He is on lisinopril. - last set of lipids: Lab Results  Component Value Date   CHOL 104 01/15/2015   HDL 26* 01/15/2015   LDLCALC 64 01/15/2015   TRIG 195* 01/19/2015   CHOLHDL 4.0 01/15/2015  He is on Lipitor. - last eye exam was in 04/2015. No DR.  - no numbness and tingling in his feet.  ROS: Constitutional: + weight loss, no fatigue, no subjective hyperthermia/hypothermia Eyes: no blurry vision, no xerophthalmia ENT: no sore throat, no nodules palpated in throat, no dysphagia/odynophagia, no hoarseness Cardiovascular: no CP/SOB/palpitations/leg swelling Respiratory: no cough/SOB Gastrointestinal: no N/V/D/C Musculoskeletal: no muscle/joint aches Skin: no rashes Neurological: no tremors/numbness/tingling/dizziness Psychiatric: no depression/anxiety  Past Medical History  Diagnosis Date  . Hypertension   . Diabetes mellitus type 2 in obese (HCC)   . Cholelithiasis   . DVT (deep venous thrombosis) (HCC)   . Cataract   . GERD (gastroesophageal reflux disease)   . MI (myocardial infarction) (HCC) 01/2015  .  Pancreatitis 01/2015   Past Surgical History  Procedure Laterality Date  . Esophagogastroduodenoscopy N/A 01/21/2015    Procedure: ESOPHAGOGASTRODUODENOSCOPY (EGD);  Surgeon: Hilarie Fredrickson, MD;  Location: [facility] ENDOSCOPY;  Service: Endoscopy;  Laterality: N/A;  to be done at BEDSIDE  . Cardiac catheterization N/A 01/17/2015    Procedure: Left Heart Cath and Coronary Angiography;  Surgeon: Lyn Records, MD;  Location: [facility] INVASIVE  CV LAB;  Service: Cardiovascular;  Laterality: N/A;  . Esophagogastroduodenoscopy N/A 02/05/2015    Procedure: ESOPHAGOGASTRODUODENOSCOPY (EGD);  Surgeon: Hart Carwin, MD;  Location: [facility] ENDOSCOPY;  Service: Endoscopy;  Laterality: N/A;  . Coronary artery bypass graft N/A 02/14/2015    Procedure: CORONARY ARTERY BYPASS GRAFTING (CABG) x three, using left internal mammary artery and left leg greater saphenous vein harvested endoscopically;  Surgeon: Kerin Perna, MD;  Location: [facility] OR;  Service: Open Heart Surgery;  Laterality: N/A;  . Tee without cardioversion N/A 02/14/2015    Procedure: TRANSESOPHAGEAL ECHOCARDIOGRAM (TEE);  Surgeon: Kerin Perna, MD;  Location: [facility] OR;  Service: Open Heart Surgery;  Laterality: N/A;   Social History   Social History  . Marital Status: Married    Spouse Name: N/A  . Number of Children: N/A  . Years of Education: N/A   Social History Main Topics  . Smoking status: Never Smoker   . Smokeless tobacco: Never Used  . Alcohol Use: No  . Drug Use: No   Current Outpatient Prescriptions on File Prior to Visit  Medication Sig Dispense Refill  . aspirin EC 81 MG EC tablet Take 1 tablet (81 mg total) by mouth daily.    Marland Kitchen atorvastatin (LIPITOR) 40 MG tablet Take 1 tablet (40 mg total) by mouth daily at 6 PM. 30 tablet 3  . insulin lispro (HUMALOG KWIKPEN) 100 UNIT/ML KiwkPen Inject 2-10 Units into the skin 3 (three) times daily. Per sliding scale for blood sugar > 200 units    . LANTUS SOLOSTAR 100 UNIT/ML Solostar Pen Inject 8 Units into the skin daily.    Marland Kitchen latanoprost (XALATAN) 0.005 % ophthalmic solution Place 1 drop into both eyes at bedtime.    . lipase/protease/amylase (CREON) 36000 UNITS CPEP capsule Take 1 capsule (36,000 Units total) by mouth 3 (three) times daily before meals. And with snacks 150 capsule 3  . lisinopril (PRINIVIL,ZESTRIL) 5 MG tablet Take 1 tablet (5 mg total) by mouth daily. 90 tablet 3  . metFORMIN (GLUCOPHAGE) 500 MG tablet Take 500  mg by mouth 2 (two) times daily with a meal.    . metoprolol succinate (TOPROL XL) 25 MG 24 hr tablet Take 0.5 tablets (12.5 mg total) by mouth 2 (two) times daily. 30 tablet 6  . pantoprazole (PROTONIX) 40 MG tablet Take 1 tablet (40 mg total) by mouth daily before breakfast. 30 tablet 11  . warfarin (COUMADIN) 2.5 MG tablet Take 1 tablet daily except 1 1/2 tablets on Mondays or as directed by coumadin clinic 45 tablet 3   No current facility-administered medications on file prior to visit.   No Known Allergies Family History  Problem Relation Age of Onset  . Heart attack Brother 55  . Pancreatic cancer Sister 29    died in her 10s.    PE: BP 132/80 mmHg  Pulse 89  Temp(Src) 97.4 F (36.3 C) (Oral)  Resp 12  Wt 130 lb 6.4 oz (59.149 kg)  SpO2 97% Body mass index is 17.68 kg/(m^2).  Wt Readings from Last 3 Encounters:  06/13/15  130 lb 6.4 oz (59.149 kg)  06/03/15 133 lb (60.328 kg)  05/01/15 138 lb (62.596 kg)   Constitutional: overweight, in NAD Eyes: PERRLA, EOMI, no exophthalmos ENT: moist mucous membranes, no thyromegaly, no cervical lymphadenopathy Cardiovascular: RRR, No MRG Respiratory: CTA B Gastrointestinal: abdomen soft, NT, ND, BS+ Musculoskeletal: no deformities, strength intact in all 4 Skin: moist, warm, no rashes Neurological: no tremor with outstretched hands, DTR normal in all 4  ASSESSMENT: 1. DM2, insulin-dependent, uncontrolled, with complications - CAD, s/p AMI, s/p 3v CABG - CKD  PLAN:  1. Patient with insulin-dependent diabetes, on oral antidiabetic regimen + SSI rapid acting insulin analog. Sugars are worse before lunch and before dinner. He did not start Humalog 2 units before breakfast and lunch as advised. He would like to try to manage his diabetes without mealtime insulin, if possible. He had 3 days off metformin before his CT scan, and the sugars were worse at that time. He asked me whether he would be helpful to increase the dose of metformin  to 3 times a day. I believe this is okay, but we have to be careful that he does not lose anymore weight. I again suggested to start Humalog mealtime insulin, 2-3 units, depending on the size of the meal, but he would like to try increasing the metformin dose first. 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 We reviewed this together with patient and his family. I explained that I do not believe at this point that he produces enough insulin to maintain his blood sugars and we will need to continue exogenous insulin. He will let me know about his sugars in 2 weeks, and at that time will decide about adding Humalog boluses before meals or not. In the meantime, I will decrease the threshold for his sliding scale of NovoLog. - I suggested to:  Patient Instructions  Please increase Metformin to 500 mg 3x a day, with meals. Continue Lantus 8 units in am. Continue NovoLog Sliding scale, but change to: 150-200: + 1 unit 201-250: + 2 units 251-300: + 3 units > 300: + 4 units  Please call me in 2 weeks with your sugars.  Please come back for a follow-up appointment in 3 months.  - continue checking sugars at different times of the day - check 2-3 times a day, rotating checks - given more sugar logs  - Reviewed most recent hemoglobin A1c: 6.9% - Return to clinic in 3 mo with sugar log

## 2015-06-14 ENCOUNTER — Encounter (HOSPITAL_COMMUNITY): Payer: Medicare Other

## 2015-06-17 ENCOUNTER — Encounter (HOSPITAL_COMMUNITY): Payer: Medicare Other

## 2015-06-19 ENCOUNTER — Encounter (HOSPITAL_COMMUNITY): Payer: Medicare Other

## 2015-06-19 ENCOUNTER — Ambulatory Visit (HOSPITAL_COMMUNITY): Admission: RE | Admit: 2015-06-19 | Payer: Medicare Other | Source: Ambulatory Visit

## 2015-06-19 NOTE — Progress Notes (Signed)
Patient is discharged from [facility] program today, June 12, 2015 with 29 sessions.  Patient quit the program.  Patient came by today to let us know that he already feels better and stronger and can just exercise at home.  He achieved LTG of 30 minutes of aerobic exercise at max met level of 4.00.  All patient vitals are WNL.  Patient has met with dietician.  Patient plans to exercise at home.  Cardiac Rehab will make 1 month, 6 month and 1 year call backs.

## 2015-06-20 ENCOUNTER — Ambulatory Visit: Payer: Self-pay | Admitting: Internal Medicine

## 2015-06-21 ENCOUNTER — Telehealth: Payer: Self-pay | Admitting: Cardiovascular Disease

## 2015-06-21 ENCOUNTER — Encounter (HOSPITAL_COMMUNITY): Payer: Medicare Other

## 2015-06-21 NOTE — Telephone Encounter (Signed)
Returned a call to the daughter-Diane to gather further information about the reason for her call.  He has been off Coumadin since 06/13/15 due to a scheduled biopsy but it was canceled and then he saw a GI Physician who immediately told them that the patient needed an ERCP within the next couple of days and since he had been off Coumadin they wanted him to remain off to undergo the ERCP.  Also, she instructed me that the patient had an ERCP on 06/18/15 that was unsuccessful and had another ERCP on 06/21/15 with success, also, she stated that the [facility] Physician/Surgeon instructed him to remain off Coumadin until 06/25/15 and then resume at normal dose.  The patient had an scheduled  appointment on 06/25/15 but will resume Coumadin on 06/25/15, therefore, I rescheduled the appointment and the daughter is aware.

## 2015-06-21 NOTE — Telephone Encounter (Signed)
[name] [name] came off coumdin on Monday 06-17-15 due to having procedure ERCP at [facility] twice this week.  Daughter called stating that He was told to come off coumdin.  He has appointment on 06-25-15 with [facility].

## 2015-06-24 ENCOUNTER — Encounter (HOSPITAL_COMMUNITY): Payer: Medicare Other

## 2015-06-26 ENCOUNTER — Encounter (HOSPITAL_COMMUNITY): Payer: Medicare Other

## 2015-06-28 ENCOUNTER — Encounter (HOSPITAL_COMMUNITY): Payer: Medicare Other

## 2015-07-01 ENCOUNTER — Encounter (HOSPITAL_COMMUNITY): Payer: Medicare Other

## 2015-07-02 ENCOUNTER — Ambulatory Visit (INDEPENDENT_AMBULATORY_CARE_PROVIDER_SITE_OTHER): Payer: Medicare Other | Admitting: *Deleted

## 2015-07-02 DIAGNOSIS — I82409 Acute embolism and thrombosis of unspecified deep veins of unspecified lower extremity: Secondary | ICD-10-CM | POA: Diagnosis not present

## 2015-07-02 LAB — POCT INR: INR: 1.4

## 2015-07-03 ENCOUNTER — Encounter (HOSPITAL_COMMUNITY): Payer: Medicare Other

## 2015-07-05 ENCOUNTER — Encounter (HOSPITAL_COMMUNITY): Payer: Medicare Other

## 2015-07-16 ENCOUNTER — Ambulatory Visit (INDEPENDENT_AMBULATORY_CARE_PROVIDER_SITE_OTHER): Payer: Medicare Other | Admitting: *Deleted

## 2015-07-16 DIAGNOSIS — I82409 Acute embolism and thrombosis of unspecified deep veins of unspecified lower extremity: Secondary | ICD-10-CM | POA: Diagnosis not present

## 2015-07-16 DIAGNOSIS — I82A29 Chronic embolism and thrombosis of unspecified axillary vein: Secondary | ICD-10-CM

## 2015-07-16 LAB — POCT INR: INR: 1.7

## 2015-08-07 ENCOUNTER — Encounter: Payer: Self-pay | Admitting: Cardiothoracic Surgery

## 2015-08-07 ENCOUNTER — Ambulatory Visit (INDEPENDENT_AMBULATORY_CARE_PROVIDER_SITE_OTHER): Payer: Medicare Other | Admitting: Cardiothoracic Surgery

## 2015-08-07 VITALS — BP 128/74 | HR 100 | Resp 16 | Ht 71.0 in | Wt 133.0 lb

## 2015-08-07 DIAGNOSIS — Z95828 Presence of other vascular implants and grafts: Secondary | ICD-10-CM

## 2015-08-07 DIAGNOSIS — Z8249 Family history of ischemic heart disease and other diseases of the circulatory system: Secondary | ICD-10-CM

## 2015-08-07 DIAGNOSIS — Z951 Presence of aortocoronary bypass graft: Secondary | ICD-10-CM | POA: Diagnosis not present

## 2015-08-07 DIAGNOSIS — Z8679 Personal history of other diseases of the circulatory system: Secondary | ICD-10-CM

## 2015-08-08 ENCOUNTER — Other Ambulatory Visit: Payer: Self-pay | Admitting: *Deleted

## 2015-08-08 ENCOUNTER — Ambulatory Visit (INDEPENDENT_AMBULATORY_CARE_PROVIDER_SITE_OTHER): Payer: Medicare Other | Admitting: *Deleted

## 2015-08-08 ENCOUNTER — Telehealth: Payer: Self-pay | Admitting: Internal Medicine

## 2015-08-08 DIAGNOSIS — Z951 Presence of aortocoronary bypass graft: Secondary | ICD-10-CM

## 2015-08-08 DIAGNOSIS — I82409 Acute embolism and thrombosis of unspecified deep veins of unspecified lower extremity: Secondary | ICD-10-CM | POA: Diagnosis not present

## 2015-08-08 LAB — POCT INR: INR: 1.5

## 2015-08-08 MED ORDER — INSULIN LISPRO 100 UNIT/ML (KWIKPEN): 2.0000 [IU] | PEN_INJECTOR | Freq: Three times a day (TID) | SUBCUTANEOUS | Status: DC | PRN

## 2015-08-08 NOTE — Telephone Encounter (Signed)
Lorie daughter calling about pt he needs a rx for the rapid insulin call into [facility] in [place]  Since he was here last he had a CT and 2 ERCPs as well as had his gallbladder removed. Please call Lorie

## 2015-08-08 NOTE — Telephone Encounter (Signed)
Noted, thank you

## 2015-08-08 NOTE — Telephone Encounter (Signed)
Sent in a refill of pt's insulin. Spoke with pt's daughter. She wanted Dr Elvera Lennox to be aware that he is not taking the full dose of Metformin due to all that he has been going through. Be advised.

## 2015-08-09 NOTE — Progress Notes (Signed)
PCP is Kirstie Peri, MD Referring Provider is Kirstie Peri, MD  Chief Complaint  Patient presents with  . Routine Post Op    3 month f/u s/p CABG X 3 02/14/15    HPI: The patient returns for further follow-up after undergoing urgent CABG 5 months ago. The patient presented with gallstone pancreatitis and had a MI. He had critical coronary anatomy and underwent CABG at that hospitalization. Prior to surgery the patient was found to have pulmonary emboli and developed GI bleeding. A IVC filter was placed by interventional radiology. The patient did well after CABG. He has remained on Coumadin. Recently the patient had a laparoscopic cholecystectomy at [facility] which went without complication. There is a gallstone remaining in the common duct and this will be retrieved by GI at [facility].  The patient has no recurrent symptoms of angina or CHF. He'll remain on Coumadin for his pre-CABG poor emboli for one year or until next spring.will be safe to remove the IVC    Filter after final ERCP. The patient was referred to interventional radiology to set up filter removal--was placed by Dr. Deanne Coffer  Patient's preoperative ejection fraction was 30%. The patient's family is interested in repeating an echocardiogram in will be referred to his cardiologist Dr. Morton Peters.   Past Medical History  Diagnosis Date  . Hypertension   . Diabetes mellitus type 2 in obese (HCC)   . Cholelithiasis   . DVT (deep venous thrombosis) (HCC)   . Cataract   . GERD (gastroesophageal reflux disease)   . MI (myocardial infarction) (HCC) 01/2015  . Pancreatitis 01/2015    Past Surgical History  Procedure Laterality Date  . Esophagogastroduodenoscopy N/A 01/21/2015    Procedure: ESOPHAGOGASTRODUODENOSCOPY (EGD);  Surgeon: Hilarie Fredrickson, MD;  Location: [facility] ENDOSCOPY;  Service: Endoscopy;  Laterality: N/A;  to be done at BEDSIDE  . Cardiac catheterization N/A 01/17/2015    Procedure: Left Heart Cath and Coronary  Angiography;  Surgeon: Lyn Records, MD;  Location: [facility] INVASIVE CV LAB;  Service: Cardiovascular;  Laterality: N/A;  . Esophagogastroduodenoscopy N/A 02/05/2015    Procedure: ESOPHAGOGASTRODUODENOSCOPY (EGD);  Surgeon: Hart Carwin, MD;  Location: [facility] ENDOSCOPY;  Service: Endoscopy;  Laterality: N/A;  . Coronary artery bypass graft N/A 02/14/2015    Procedure: CORONARY ARTERY BYPASS GRAFTING (CABG) x three, using left internal mammary artery and left leg greater saphenous vein harvested endoscopically;  Surgeon: Kerin Perna, MD;  Location: [facility] OR;  Service: Open Heart Surgery;  Laterality: N/A;  . Tee without cardioversion N/A 02/14/2015    Procedure: TRANSESOPHAGEAL ECHOCARDIOGRAM (TEE);  Surgeon: Kerin Perna, MD;  Location: [facility] OR;  Service: Open Heart Surgery;  Laterality: N/A;    Family History  Problem Relation Age of Onset  . Heart attack Brother 55  . Pancreatic cancer Sister 43    died in her 80s.     Social History Social History  Substance Use Topics  . Smoking status: Never Smoker   . Smokeless tobacco: Never Used  . Alcohol Use: No    Current Outpatient Prescriptions  Medication Sig Dispense Refill  . aspirin EC 81 MG EC tablet Take 1 tablet (81 mg total) by mouth daily.    Marland Kitchen atorvastatin (LIPITOR) 40 MG tablet Take 1 tablet (40 mg total) by mouth daily at 6 PM. 30 tablet 3  . LANTUS SOLOSTAR 100 UNIT/ML Solostar Pen Inject 8 Units into the skin daily.    Marland Kitchen latanoprost (XALATAN) 0.005 % ophthalmic solution Place  1 drop into both eyes at bedtime.    . lipase/protease/amylase (CREON) 36000 UNITS CPEP capsule Take 1 capsule (36,000 Units total) by mouth 3 (three) times daily before meals. And with snacks 150 capsule 3  . lisinopril (PRINIVIL,ZESTRIL) 5 MG tablet Take 1 tablet (5 mg total) by mouth daily. 90 tablet 3  . metFORMIN (GLUCOPHAGE) 500 MG tablet Take 500 mg by mouth 2 (two) times daily. Takes an additional tablet if BS is high    . metoprolol succinate (TOPROL  XL) 25 MG 24 hr tablet Take 0.5 tablets (12.5 mg total) by mouth 2 (two) times daily. 30 tablet 6  . pantoprazole (PROTONIX) 40 MG tablet Take 1 tablet (40 mg total) by mouth daily before breakfast. 30 tablet 11  . warfarin (COUMADIN) 2.5 MG tablet Take 1 tablet daily except 1 1/2 tablets on Mondays or as directed by coumadin clinic (Patient taking differently: Take 2.5-5 mg by mouth daily. 5 mg on Mon and Thus, all other days take 2.5 mg) 45 tablet 3  . insulin lispro (HUMALOG KWIKPEN) 100 UNIT/ML KiwkPen Inject 0.02-0.1 mLs (2-10 Units total) into the skin 3 (three) times daily as needed (increased BS). Per sliding scale for blood sugar > 200 units 15 mL 2   No current facility-administered medications for this visit.    No Known Allergies  Review of Systems  No abdominal pain or nausea and vomiting Diabetes fairly well controlled Chest incisions all well-healed No shortness of breath or chest pain No fever Appetite adequate The patient had a residual left pleural effusion after CABG-chest x-ray done today  BP 128/74 mmHg  Pulse 100  Resp 16  Ht  (1.803 m)  Wt 133 lb (60.328 kg)  BMI 18.56 kg/m2  SpO2 96% Physical Exam       Physical Exam  General: Chronically ill thin middle-aged Caucasian male accompanied by family HEENT: Normocephalic pupils equal , dentition adequate Neck: Supple without JVD, adenopathy, or bruit Chest: Clear to auscultation, symmetrical breath sounds, no rhonchi, no tenderness             or deformity Cardiovascular: Regular rate and rhythm, no murmur, no gallop, peripheral pulses             palpable in all extremities Abdomen:  Soft, nontender, no palpable mass or organomegaly Extremities: Warm, well-perfused, no clubbing cyanosis edema or tenderness,              no venous stasis changes of the legs Rectal/GU: Deferred Neuro: Grossly non--focal and symmetrical throughout Skin: Clean and dry without rash or ulceration   Diagnostic  Tests: Most recent chest x-ray personally reviewed showing no pleural effusion.  Impression: Doing well following CABG. We'll arrange for the patient to be seen by interventional radiology to schedule removal of IVC filter. He'll continue Coumadin until April 1  Plan: Return for follow-up in a month  Mikey Bussing, MD [facility] [phone]

## 2015-08-13 ENCOUNTER — Other Ambulatory Visit: Payer: Self-pay

## 2015-08-13 DIAGNOSIS — I824Y9 Acute embolism and thrombosis of unspecified deep veins of unspecified proximal lower extremity: Secondary | ICD-10-CM

## 2015-08-13 DIAGNOSIS — O2230 Deep phlebothrombosis in pregnancy, unspecified trimester: Principal | ICD-10-CM

## 2015-08-13 DIAGNOSIS — I82409 Acute embolism and thrombosis of unspecified deep veins of unspecified lower extremity: Secondary | ICD-10-CM

## 2015-08-15 ENCOUNTER — Telehealth: Payer: Self-pay | Admitting: Cardiovascular Disease

## 2015-08-15 ENCOUNTER — Other Ambulatory Visit: Payer: Self-pay | Admitting: Cardiovascular Disease

## 2015-08-15 NOTE — Telephone Encounter (Signed)
Patient notified

## 2015-08-15 NOTE — Telephone Encounter (Signed)
[name] [name] called the office today stating that he has a knot on the inside of his right leg. Wanting to know if he needs to have this checked.

## 2015-08-15 NOTE — Telephone Encounter (Signed)
States this is not new & has been there x 4 months.  This is in the leg relating to his CABG 02-14-15.  No c/o swelling, pain, redness, & has not changed any during the time since he has noticed it.  Patient is due to see [facility] in 10/2015.  Informed patient that message will be sent to provider for further advice.

## 2015-08-15 NOTE — Telephone Encounter (Signed)
Likely benign. Can await f/u with CT surgery unless this were to become painful and/or more swollen. Would then consider ultrasonography.

## 2015-08-23 ENCOUNTER — Other Ambulatory Visit: Payer: Self-pay | Admitting: Radiology

## 2015-08-26 ENCOUNTER — Ambulatory Visit (HOSPITAL_COMMUNITY)
Admission: RE | Admit: 2015-08-26 | Discharge: 2015-08-26 | Disposition: A | Discharge status: Home / Self Care | Payer: Medicare Other | Source: Ambulatory Visit | Attending: Cardiothoracic Surgery | Admitting: Cardiothoracic Surgery

## 2015-08-26 ENCOUNTER — Ambulatory Visit (HOSPITAL_BASED_OUTPATIENT_CLINIC_OR_DEPARTMENT_OTHER)
Admission: RE | Admit: 2015-08-26 | Discharge: 2015-08-26 | Disposition: A | Discharge status: Home / Self Care | Payer: Medicare Other | Source: Ambulatory Visit | Attending: Cardiothoracic Surgery | Admitting: Cardiothoracic Surgery

## 2015-08-26 DIAGNOSIS — Z86718 Personal history of other venous thrombosis and embolism: Secondary | ICD-10-CM | POA: Insufficient documentation

## 2015-08-26 DIAGNOSIS — Z7984 Long term (current) use of oral hypoglycemic drugs: Secondary | ICD-10-CM | POA: Diagnosis not present

## 2015-08-26 DIAGNOSIS — Z7982 Long term (current) use of aspirin: Secondary | ICD-10-CM | POA: Insufficient documentation

## 2015-08-26 DIAGNOSIS — R2241 Localized swelling, mass and lump, right lower limb: Secondary | ICD-10-CM

## 2015-08-26 DIAGNOSIS — K219 Gastro-esophageal reflux disease without esophagitis: Secondary | ICD-10-CM | POA: Diagnosis not present

## 2015-08-26 DIAGNOSIS — Z951 Presence of aortocoronary bypass graft: Secondary | ICD-10-CM | POA: Diagnosis not present

## 2015-08-26 DIAGNOSIS — I252 Old myocardial infarction: Secondary | ICD-10-CM | POA: Insufficient documentation

## 2015-08-26 DIAGNOSIS — E119 Type 2 diabetes mellitus without complications: Secondary | ICD-10-CM | POA: Insufficient documentation

## 2015-08-26 DIAGNOSIS — Z95828 Presence of other vascular implants and grafts: Secondary | ICD-10-CM | POA: Insufficient documentation

## 2015-08-26 DIAGNOSIS — K859 Acute pancreatitis without necrosis or infection, unspecified: Secondary | ICD-10-CM | POA: Diagnosis not present

## 2015-08-26 DIAGNOSIS — K922 Gastrointestinal hemorrhage, unspecified: Secondary | ICD-10-CM | POA: Insufficient documentation

## 2015-08-26 DIAGNOSIS — I10 Essential (primary) hypertension: Secondary | ICD-10-CM | POA: Diagnosis not present

## 2015-08-26 DIAGNOSIS — Z7901 Long term (current) use of anticoagulants: Secondary | ICD-10-CM | POA: Diagnosis not present

## 2015-08-26 DIAGNOSIS — I824Y9 Acute embolism and thrombosis of unspecified deep veins of unspecified proximal lower extremity: Secondary | ICD-10-CM | POA: Insufficient documentation

## 2015-08-26 DIAGNOSIS — Z4689 Encounter for fitting and adjustment of other specified devices: Secondary | ICD-10-CM | POA: Insufficient documentation

## 2015-08-26 DIAGNOSIS — Z794 Long term (current) use of insulin: Secondary | ICD-10-CM | POA: Diagnosis not present

## 2015-08-26 LAB — CBC WITH DIFFERENTIAL/PLATELET
Basophils Absolute: 0 10*3/uL (ref 0.0–0.1)
Basophils Relative: 1 %
EOS ABS: 0.5 10*3/uL (ref 0.0–0.7)
EOS PCT: 6 %
HCT: 30.2 % — ABNORMAL LOW (ref 39.0–52.0)
Hemoglobin: 9.9 g/dL — ABNORMAL LOW (ref 13.0–17.0)
LYMPHS ABS: 1.5 10*3/uL (ref 0.7–4.0)
Lymphocytes Relative: 18 %
MCH: 29.9 pg (ref 26.0–34.0)
MCHC: 32.8 g/dL (ref 30.0–36.0)
MCV: 91.2 fL (ref 78.0–100.0)
Monocytes Absolute: 0.4 10*3/uL (ref 0.1–1.0)
Monocytes Relative: 4 %
Neutro Abs: 6.1 10*3/uL (ref 1.7–7.7)
Neutrophils Relative %: 71 %
PLATELETS: 173 10*3/uL (ref 150–400)
RBC: 3.31 MIL/uL — AB (ref 4.22–5.81)
RDW: 16.1 % — ABNORMAL HIGH (ref 11.5–15.5)
WBC: 8.6 10*3/uL (ref 4.0–10.5)

## 2015-08-26 LAB — PROTIME-INR
INR: 2.4 — ABNORMAL HIGH (ref 0.00–1.49)
PROTHROMBIN TIME: 25.9 s — AB (ref 11.6–15.2)

## 2015-08-26 LAB — BASIC METABOLIC PANEL
Anion gap: 7 (ref 5–15)
BUN: 15 mg/dL (ref 6–20)
CALCIUM: 8 mg/dL — AB (ref 8.9–10.3)
CHLORIDE: 111 mmol/L (ref 101–111)
CO2: 23 mmol/L (ref 22–32)
CREATININE: 0.93 mg/dL (ref 0.61–1.24)
GFR calc Af Amer: >60 mL/min (ref >60)
Glucose, Bld: 134 mg/dL — ABNORMAL HIGH (ref 65–99)
Potassium: 5.1 mmol/L (ref 3.5–5.1)
SODIUM: 141 mmol/L (ref 135–145)

## 2015-08-26 LAB — APTT: aPTT: 34 seconds (ref 24–37)

## 2015-08-26 MED ORDER — MIDAZOLAM HCL 2 MG/2ML IJ SOLN
INTRAMUSCULAR | Status: AC
  Filled 2015-08-26: qty 2

## 2015-08-26 MED ORDER — LIDOCAINE HCL 1 % IJ SOLN
INTRAMUSCULAR | Status: AC
  Filled 2015-08-26: qty 20

## 2015-08-26 MED ORDER — FENTANYL CITRATE (PF) 100 MCG/2ML IJ SOLN
INTRAMUSCULAR | Status: AC | PRN
  Administered 2015-08-26: 25 ug via INTRAVENOUS

## 2015-08-26 MED ORDER — IOHEXOL 300 MG/ML  SOLN
100.0000 mL | Freq: Once | INTRAMUSCULAR | Status: AC | PRN
  Administered 2015-08-26: 40 mL via INTRAVENOUS

## 2015-08-26 MED ORDER — FENTANYL CITRATE (PF) 100 MCG/2ML IJ SOLN
INTRAMUSCULAR | Status: AC
  Filled 2015-08-26: qty 2

## 2015-08-26 MED ORDER — SODIUM CHLORIDE 0.9 % IV SOLN
INTRAVENOUS | Status: AC | PRN
  Administered 2015-08-26: 10 mL/h via INTRAVENOUS

## 2015-08-26 MED ORDER — HYDROCODONE-ACETAMINOPHEN 5-325 MG PO TABS: 1.0000 | ORAL_TABLET | ORAL | Status: DC | PRN

## 2015-08-26 MED ORDER — SODIUM CHLORIDE 0.9 % IV SOLN
INTRAVENOUS | Status: DC
  Administered 2015-08-26: 09:00:00 via INTRAVENOUS

## 2015-08-26 MED ORDER — MIDAZOLAM HCL 2 MG/2ML IJ SOLN
INTRAMUSCULAR | Status: AC | PRN
  Administered 2015-08-26: 1 mg via INTRAVENOUS

## 2015-08-26 NOTE — Discharge Instructions (Signed)
Wound Care °Taking care of your wound properly can help to prevent pain and infection. It can also help your wound to heal more quickly.  °HOW TO CARE FOR YOUR WOUND  °· Take or apply over-the-counter and prescription medicines only as told by your health care provider. °· If you were prescribed antibiotic medicine, take or apply it as told by your health care provider. Do not stop using the antibiotic even if your condition improves. °· Clean the wound each day or as told by your health care provider. °¨ Wash the wound with mild soap and water. °¨ Rinse the wound with water to remove all soap. °¨ Pat the wound dry with a clean towel. Do not rub it. °· There are many different ways to close and cover a wound. For example, a wound can be covered with stitches (sutures), skin glue, or adhesive strips. Follow instructions from your health care provider about: °¨ How to take care of your wound. °¨ When and how you should change your bandage (dressing). °¨ When you should remove your dressing. °¨ Removing whatever was used to close your wound. °· Check your wound every day for signs of infection. Watch for: °¨ Redness, swelling, or pain. °¨ Fluid, blood, or pus. °· Keep the dressing dry until your health care provider says it can be removed. Do not take baths, swim, use a hot tub, or do anything that would put your wound underwater until your health care provider approves. °· Raise (elevate) the injured area above the level of your heart while you are sitting or lying down. °· Do not scratch or pick at the wound. °· Keep all follow-up visits as told by your health care provider. This is important. °SEEK MEDICAL CARE IF: °· You received a tetanus shot and you have swelling, severe pain, redness, or bleeding at the injection site. °· You have a fever. °· Your pain is not controlled with medicine. °· You have increased redness, swelling, or pain at the site of your wound. °· You have fluid, blood, or pus coming from your  wound. °· You notice a bad smell coming from your wound or your dressing. °SEEK IMMEDIATE MEDICAL CARE IF: °· You have a red streak going away from your wound. °  °This information is not intended to replace advice given to you by your health care provider. Make sure you discuss any questions you have with your health care provider. °  °Document Released: 06/02/2008 Document Revised: 01/08/2015 Document Reviewed: 08/20/2014 °Elsevier Interactive Patient Education ©2016 [employer] ° °

## 2015-08-26 NOTE — Progress Notes (Signed)
VASCULAR LAB PRELIMINARY  PRELIMINARY  PRELIMINARY  PRELIMINARY  Right lower extremity venous duplex completed.    Preliminary report:  Right:  No evidence of DVT, superficial thrombosis, or Baker's cyst. Incidental finding   ; There is an area of fluid noted in the right medial distal thigh etiology unknown. Measures 3.67 cm longitudinal and 2.56 cm x 1.96 cm transverse with no obvious evidence of vascularization.  Hebert Dooling, RVS 08/26/2015, 12:34 PM

## 2015-08-26 NOTE — Progress Notes (Signed)
Patient ID: [name] [name], male   DOB: [birth year], 74 y.o.   MRN: [id number]    Referring Physician(s): Zenaida Niece Trigt,Peter  Chief Complaint: "I'm here to get my filter out"   Subjective: Patient familiar to IR service from prior placement of IVC filter in May 2016 secondary to acute bilateral DVT and GI bleed. Patient is currently on Coumadin and has had no further bleeding and presents today for IVC filter removal. Patient has also undergone CABG and cholecystectomy since filter placement. He currently denies fever, chest pain, dyspnea, cough, headache, abdominal/back pain, nausea or vomiting. He has had significant weight loss.   Allergies: Review of patient's allergies indicates no known allergies.  Medications: Prior to Admission medications   Medication Sig Start Date End Date Taking? Authorizing Provider  aspirin EC 81 MG EC tablet Take 1 tablet (81 mg total) by mouth daily. 02/21/15  Yes Wayne E Gold, PA-C  atorvastatin (LIPITOR) 40 MG tablet TAKE ONE TABLET BY MOUTH ONCE DAILY IN THE EVENING AT  6PM 08/15/15  Yes Laqueta Linden, MD  insulin lispro (HUMALOG KWIKPEN) 100 UNIT/ML KiwkPen Inject 0.02-0.1 mLs (2-10 Units total) into the skin 3 (three) times daily as needed (increased BS). Per sliding scale for blood sugar > 200 units 08/08/15  Yes Carlus Pavlov, MD  LANTUS SOLOSTAR 100 UNIT/ML Solostar Pen Inject 8 Units into the skin every morning.  05/01/15  Yes Historical Provider, MD  latanoprost (XALATAN) 0.005 % ophthalmic solution Place 1 drop into both eyes at bedtime. 01/02/15  Yes Historical Provider, MD  lipase/protease/amylase (CREON) 36000 UNITS CPEP capsule Take 1 capsule (36,000 Units total) by mouth 3 (three) times daily before meals. And with snacks 04/24/15  Yes Iva Boop, MD  lisinopril (PRINIVIL,ZESTRIL) 5 MG tablet Take 1 tablet (5 mg total) by mouth daily. 03/12/15  Yes Laqueta Linden, MD  metFORMIN (GLUCOPHAGE) 500 MG tablet Take 500 mg by mouth 2 (two)  times daily. Takes an additional tablet if BS is high   Yes Historical Provider, MD  metoprolol succinate (TOPROL XL) 25 MG 24 hr tablet Take 0.5 tablets (12.5 mg total) by mouth 2 (two) times daily. 03/07/15  Yes Laqueta Linden, MD  pantoprazole (PROTONIX) 40 MG tablet Take 1 tablet (40 mg total) by mouth daily before breakfast. 04/24/15  Yes Iva Boop, MD  warfarin (COUMADIN) 2.5 MG tablet Take 1 tablet daily except 1 1/2 tablets on Mondays or as directed by coumadin clinic Patient taking differently: Take 2.5-5 mg by mouth daily. 5 mg on Mon and Thus, all other days take 2.5 mg 04/25/15  Yes Laqueta Linden, MD     Vital Signs: BP 134/62 mmHg  Pulse 85  Temp(Src) 97.5 F (36.4 C)  Resp 16  Ht 5\' 11"  (1.803 m)  Wt 130 lb (58.968 kg)  BMI 18.14 kg/m2  SpO2 100%  Physical Exam cachectic appearing male in no acute distress. Chest with clear breath sounds bilaterally. Heart with regular rate and rhythm. Abdomen soft, positive bowel sounds, nontender, clean surgical sites. Extremities with left greater than right pretibial edema; patient also noted to have soft tissue mass medial aspect right knee region, nontender to palpation.  Imaging: No results found.  Labs:  CBC:  Recent Labs  02/17/15 0400 02/18/15 0430 02/19/15 0445 04/04/15 1502  WBC 9.4 7.0 8.4 6.4  HGB 8.4* 8.5* 9.7* 10.5*  HCT 26.1* 26.3* 30.8* 32.8*  PLT 137* 159 243 230.0    COAGS:  Recent Labs  01/21/15 0930 01/23/15 0447 02/11/15 1604  02/14/15 1342  06/05/15 0841 07/02/15 0937 07/16/15 0922 08/08/15 0932  INR 1.52* 1.43  --   < > 1.49  < > 1.8 1.4 1.7 1.5  APTT 28 32 29  --  36  --   --   --   --   --   < > = values in this interval not displayed.  BMP:  Recent Labs  02/16/15 0415 02/17/15 0400 02/18/15 0430 02/19/15 0445 04/04/15 1502  NA 135 136 136 138 136  K 4.0 3.9 3.8 3.9 4.5  CL 102 101 102 106 102  CO2 GLUCOSE 117* 82 153* 167* 192*  BUN 19 20 23*  25* 22  CALCIUM 7.5* 7.4* 7.4* 8.0* 8.7  CREATININE 1.54* 1.49* 1.46* 1.35* 1.41  GFRNONAA 43* 45* 46* 51*  --   GFRAA 50* 52* 54* 59*  --     LIVER FUNCTION TESTS:  Recent Labs  02/08/15 0540 02/12/15 0507 02/13/15 0426 04/04/15 1502  BILITOT 0.7 0.8 0.9 1.2  AST 45* 28 30 34  ALT 38 28 26 36  ALKPHOS 71 67 69 98  PROT 5.4* 5.7* 5.7* 7.6  ALBUMIN 1.7* 1.8* 1.9* 3.3*    Assessment and Plan:  Pt s/p IVC filter in May 2016 secondary to acute bilateral DVT and GI bleed. Patient is currently on Coumadin , has had no further bleeding and presents today for IVC filter removal. Patient has also undergone CABG and cholecystectomy since filter placement. Details/risks of filter removal, including but not limited to, internal bleeding, infection, inability to remove filter discussed with patient and wife with their understanding and consent. Labs pending.   Signed: D. Jeananne Rama 08/26/2015, 9:13 AM   I spent a total of 15 minutes at the the patient's bedside AND on the patient's hospital floor or unit, greater than 50% of which was counseling/coordinating care for IVC filter removal

## 2015-08-26 NOTE — Procedures (Signed)
IVC gram nl IVC filter retrieval No complication No blood loss. See complete dictation in [facility].

## 2015-08-26 NOTE — Sedation Documentation (Signed)
Dr Vincent Gros office notified of lump on R leg, Dr Rica Records assessed site and recommends [place] of area

## 2015-08-29 ENCOUNTER — Ambulatory Visit (INDEPENDENT_AMBULATORY_CARE_PROVIDER_SITE_OTHER): Payer: Medicare Other | Admitting: *Deleted

## 2015-08-29 DIAGNOSIS — I82409 Acute embolism and thrombosis of unspecified deep veins of unspecified lower extremity: Secondary | ICD-10-CM | POA: Diagnosis not present

## 2015-08-29 LAB — POCT INR: INR: 2

## 2015-09-04 ENCOUNTER — Ambulatory Visit (INDEPENDENT_AMBULATORY_CARE_PROVIDER_SITE_OTHER): Payer: Medicare Other

## 2015-09-04 ENCOUNTER — Other Ambulatory Visit: Payer: Self-pay

## 2015-09-04 ENCOUNTER — Ambulatory Visit: Payer: Medicare Other

## 2015-09-04 DIAGNOSIS — R0989 Other specified symptoms and signs involving the circulatory and respiratory systems: Secondary | ICD-10-CM

## 2015-09-04 DIAGNOSIS — Z951 Presence of aortocoronary bypass graft: Secondary | ICD-10-CM | POA: Diagnosis not present

## 2015-09-04 DIAGNOSIS — I10 Essential (primary) hypertension: Secondary | ICD-10-CM | POA: Diagnosis not present

## 2015-09-10 ENCOUNTER — Telehealth: Payer: Self-pay | Admitting: *Deleted

## 2015-09-10 NOTE — Telephone Encounter (Signed)
Notes Recorded by Lesle Chris, LPN on 9/0/2409 at 9:59 AM Patient notified via voice mail. Copy to pmd.  Notes Recorded by Laqueta Linden, MD on 09/05/2015 at 9:50 AM More significant blockage on left than right.

## 2015-09-10 NOTE — Telephone Encounter (Signed)
-----   Message from Laqueta Linden, MD sent at 09/05/2015  9:48 AM EST ----- Needs to be repeated in 6 months.

## 2015-09-12 ENCOUNTER — Encounter: Payer: Self-pay | Admitting: Internal Medicine

## 2015-09-12 ENCOUNTER — Other Ambulatory Visit (INDEPENDENT_AMBULATORY_CARE_PROVIDER_SITE_OTHER): Payer: Medicare Other | Admitting: *Deleted

## 2015-09-12 ENCOUNTER — Other Ambulatory Visit: Payer: Self-pay | Admitting: *Deleted

## 2015-09-12 ENCOUNTER — Ambulatory Visit (INDEPENDENT_AMBULATORY_CARE_PROVIDER_SITE_OTHER): Payer: Medicare Other | Admitting: Internal Medicine

## 2015-09-12 VITALS — BP 112/68 | HR 85 | Temp 97.5°F | Resp 12 | Wt 133.0 lb

## 2015-09-12 DIAGNOSIS — E1159 Type 2 diabetes mellitus with other circulatory complications: Secondary | ICD-10-CM

## 2015-09-12 DIAGNOSIS — Z794 Long term (current) use of insulin: Secondary | ICD-10-CM

## 2015-09-12 LAB — POCT GLYCOSYLATED HEMOGLOBIN (HGB A1C): Hemoglobin A1C: 6.3

## 2015-09-12 MED ORDER — GLUCOSE BLOOD VI STRP: ORAL_STRIP | Status: DC

## 2015-09-12 MED ORDER — ONETOUCH DELICA LANCETS 33G MISC: Status: AC

## 2015-09-12 NOTE — Progress Notes (Signed)
Patient ID: [name] [name], male   DOB: 1941-08-19, 75 y.o.   MRN: [id number]  HPI: [name] is a 75 y.o.-year-old male, initially referred by his gastroenterologist, Dr Leone Payor, returning for follow-up for DM2, dx in ~2006, insulin-dependent, uncontrolled, with complications (CAD, s/p AMI, s/p CABG; CKD). Last visit 1.5 mo ago. He is here with his wife and daughter, who offer part of the history.  He had cholecystectomy in 07/2015 and had 3 ERCPs and had the IVC filter extracted. He will have the stent taken out in 4 months.  Reviewed hx: Patient was admitted in 01/2015 with acute biliary pancreatitis secondary to gallstones and he had a very complicated hospitalization. He had an MI and had to have 3 vessel CABG during that hospitalization. He also developed esophagitis with GI bleeding. He is now slowly improving and continues to see Dr. Leone Payor for his pancreatitis. On the latest CT scan, there was a peripancreatic fluid collection, ? Necrosis. He has diarrhea >> started Creon >> a little better.   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  Last hemoglobin A1c was: Lab Results  Component Value Date   HGBA1C 6.9 05/01/2015   HGBA1C 7.5* 01/13/2015   Pt is on a regimen of: - Metformin 500 mg 2x a day, with meals (losing weight on a higher dose) - Lantus 8 units in am - Humalog SSI units 3x a day, before meals - if sugars >200 He was on Glipizide before the pancreatitis. He was on Januvia 6 mo ago.  Pt checks his sugars 3x a day and they are: - am: 108, 122-170, 180, 220 >> 90-140, 160, 190 - 2h after b'fast: n/c - before lunch: 187-271 >> 186-210 - 2h after lunch: n/c - before dinner: 113-236 >> 147-201 - 2h after dinner: n/c - bedtime:  n/c - nighttime: n/c No lows. Lowest sugar was 70 (on Glipizide) >> 80s >> 90; he has hypoglycemia awareness at 70.  Highest sugar was 270s >> 271 >> 290.  Glucometer: ReliOn  Pt's meals are: - Breakfast: cereal + strawberries (Special K) + oatmeal; toast + eggs - Lunch: potatoes, casseroles, chicken - Dinner: sandwich or soup, used to eat out - Snacks: yoghurt, cheese; no snacks at night  - + CKD, last BUN/creatinine:  Lab Results  Component Value Date   BUN 15 08/26/2015   CREATININE 0.93 08/26/2015  He is on lisinopril. - last set of lipids: Lab Results  Component Value Date   CHOL 104 01/15/2015   HDL 26* 01/15/2015   LDLCALC 64 01/15/2015   TRIG 195* 01/19/2015   CHOLHDL 4.0 01/15/2015  He is on Lipitor. - last eye exam was in 04/2015. No DR.  - no numbness and tingling in his feet.  ROS: Constitutional: no weight loss, no fatigue, no subjective hyperthermia/hypothermia Eyes: no blurry vision, no xerophthalmia ENT: no sore throat, no nodules palpated in throat, no dysphagia/odynophagia, no hoarseness Cardiovascular: no CP/SOB/palpitations/leg swelling Respiratory: no cough/SOB Gastrointestinal: no N/V/D/C Musculoskeletal: no muscle/joint aches Skin: no rashes Neurological: no tremors/numbness/tingling/dizziness  I reviewed pt's medications, allergies, PMH, social hx, family hx, and changes were documented in the history of present illness. Otherwise, unchanged from my initial visit note.  Past Medical History  Diagnosis Date  . Hypertension   .  Diabetes mellitus type 2 in obese (HCC)   . Cholelithiasis   . DVT (deep venous thrombosis) (HCC)   . Cataract   . GERD (gastroesophageal reflux disease)   . MI (myocardial infarction) (HCC) 01/2015  . Pancreatitis 01/2015   Past Surgical History  Procedure Laterality Date  . Esophagogastroduodenoscopy N/A 01/21/2015    Procedure: ESOPHAGOGASTRODUODENOSCOPY (EGD);  Surgeon: Hilarie Fredrickson, MD;  Location: [facility] ENDOSCOPY;   Service: Endoscopy;  Laterality: N/A;  to be done at BEDSIDE  . Cardiac catheterization N/A 01/17/2015    Procedure: Left Heart Cath and Coronary Angiography;  Surgeon: Lyn Records, MD;  Location: [facility] INVASIVE CV LAB;  Service: Cardiovascular;  Laterality: N/A;  . Esophagogastroduodenoscopy N/A 02/05/2015    Procedure: ESOPHAGOGASTRODUODENOSCOPY (EGD);  Surgeon: Hart Carwin, MD;  Location: [facility] ENDOSCOPY;  Service: Endoscopy;  Laterality: N/A;  . Coronary artery bypass graft N/A 02/14/2015    Procedure: CORONARY ARTERY BYPASS GRAFTING (CABG) x three, using left internal mammary artery and left leg greater saphenous vein harvested endoscopically;  Surgeon: Kerin Perna, MD;  Location: [facility] OR;  Service: Open Heart Surgery;  Laterality: N/A;  . Tee without cardioversion N/A 02/14/2015    Procedure: TRANSESOPHAGEAL ECHOCARDIOGRAM (TEE);  Surgeon: Kerin Perna, MD;  Location: [facility] OR;  Service: Open Heart Surgery;  Laterality: N/A;   Social History   Social History  . Marital Status: Married    Spouse Name: N/A  . Number of Children: N/A  . Years of Education: N/A   Social History Main Topics  . Smoking status: Never Smoker   . Smokeless tobacco: Never Used  . Alcohol Use: No  . Drug Use: No   Current Outpatient Prescriptions on File Prior to Visit  Medication Sig Dispense Refill  . aspirin EC 81 MG EC tablet Take 1 tablet (81 mg total) by mouth daily.    Marland Kitchen atorvastatin (LIPITOR) 40 MG tablet TAKE ONE TABLET BY MOUTH ONCE DAILY IN THE EVENING AT  6PM 30 tablet 6  . insulin lispro (HUMALOG KWIKPEN) 100 UNIT/ML KiwkPen Inject 0.02-0.1 mLs (2-10 Units total) into the skin 3 (three) times daily as needed (increased BS). Per sliding scale for blood sugar > 200 units 15 mL 2  . LANTUS SOLOSTAR 100 UNIT/ML Solostar Pen Inject 8 Units into the skin every morning.     . latanoprost (XALATAN) 0.005 % ophthalmic solution Place 1 drop into both eyes at bedtime.    . lipase/protease/amylase (CREON) 36000  UNITS CPEP capsule Take 1 capsule (36,000 Units total) by mouth 3 (three) times daily before meals. And with snacks (Patient taking differently: Take 24,000 Units by mouth 3 (three) times daily before meals. And with snacks) 150 capsule 3  . lisinopril (PRINIVIL,ZESTRIL) 5 MG tablet Take 1 tablet (5 mg total) by mouth daily. 90 tablet 3  . metFORMIN (GLUCOPHAGE) 500 MG tablet Take 500 mg by mouth 2 (two) times daily. Takes an additional tablet if BS is high    . metoprolol succinate (TOPROL XL) 25 MG 24 hr tablet Take 0.5 tablets (12.5 mg total) by mouth 2 (two) times daily. 30 tablet 6  . pantoprazole (PROTONIX) 40 MG tablet Take 1 tablet (40 mg total) by mouth daily before breakfast. 30 tablet 11  . warfarin (COUMADIN) 2.5 MG tablet Take 1 tablet daily except 1 1/2 tablets on Mondays or as directed by coumadin clinic (Patient taking differently: Take 2.5-5 mg by mouth daily. 5 mg on Mon and Thus, all other days take  2.5 mg (Tues, Thurs, Sat)) 45 tablet 3   No current facility-administered medications on file prior to visit.   No Known Allergies Family History  Problem Relation Age of Onset  . Heart attack Brother 55  . Pancreatic cancer Sister 45    died in her 73s.    PE: BP 112/68 mmHg  Pulse 85  Temp(Src) 97.5 F (36.4 C) (Oral)  Resp 12  Wt 133 lb (60.328 kg)  SpO2 98% Body mass index is 18.56 kg/(m^2).  Wt Readings from Last 3 Encounters:  09/12/15 133 lb (60.328 kg)  08/26/15 130 lb (58.968 kg)  08/07/15 133 lb (60.328 kg)   Constitutional: thin, in NAD Eyes: PERRLA, EOMI, no exophthalmos ENT: moist mucous membranes, no thyromegaly, no cervical lymphadenopathy Cardiovascular: RRR, No MRG, + LE pitting edema B Respiratory: CTA B Gastrointestinal: abdomen soft, NT, ND, BS+ Musculoskeletal: no deformities, strength intact in all 4 Skin: moist, warm, no rashes Neurological: no tremor with outstretched hands, DTR normal in all 4  ASSESSMENT: 1. DM2, insulin-dependent,  uncontrolled, with complications - CAD, s/p AMI, s/p 3v CABG - CKD  PLAN:  1. Patient with insulin-dependent diabetes, on oral antidiabetic regimen + SSI rapid acting insulin analog. Sugars are worse before lunch and before dinner, but improved. He did not start Humalog 2 units before breakfast and lunch as advised and would not want to start. He wonders whether he can increase Lantus a little instead >> we can do that >> will increase to 10 units.  - I suggested to:  Patient Instructions  Please continue Metformin 500 mg 2x a day, with meals. Increase Lantus to 10 units in am. Continue NovoLog Sliding scale, but change to: 201-250: + 2 units 251-300: + 3 units > 300: + 4 units  Please come back for a follow-up appointment in 3 months.  - continue checking sugars at different times of the day - check 2 times a day, rotating checks - will check hemoglobin A1c >> 6.3% (much better). - will give him a new meter - Return to clinic in 3 mo with sugar log

## 2015-09-12 NOTE — Patient Instructions (Signed)
Patient Instructions  Please continue Metformin 500 mg 2x a day, with meals. Increase Lantus to 10 units in am. Continue NovoLog Sliding scale, but change to: 201-250: + 2 units 251-300: + 3 units > 300: + 4 units  Please come back for a follow-up appointment in 3 months.

## 2015-09-17 ENCOUNTER — Telehealth: Payer: Self-pay | Admitting: Cardiovascular Disease

## 2015-09-17 DIAGNOSIS — I10 Essential (primary) hypertension: Secondary | ICD-10-CM

## 2015-09-17 MED ORDER — FUROSEMIDE 40 MG PO TABS: 40.0000 mg | ORAL_TABLET | Freq: Every day | ORAL | Status: DC

## 2015-09-17 MED ORDER — POTASSIUM CHLORIDE CRYS ER 20 MEQ PO TBCR: 20.0000 meq | EXTENDED_RELEASE_TABLET | Freq: Every day | ORAL | Status: DC

## 2015-09-17 NOTE — Telephone Encounter (Signed)
If he is still taking Lasix 40 mg daily, have him take bid x next 4 days then reduce back to 40 mg daily. Have BMET checked in 3 days.

## 2015-09-17 NOTE — Telephone Encounter (Signed)
Pt says he hasn't taking lasix since July. Do you want him on 40 mg daily or bid X 4 days?

## 2015-09-17 NOTE — Telephone Encounter (Signed)
Pt will pick up KCl and lasix and have lab work done 3 days after. Lab orders placed.

## 2015-09-17 NOTE — Telephone Encounter (Signed)
Pt c/o swelling in the feet and ankles (more in the L leg than right) starting 2 weeks ago. Pt denies chest pain/SOB/dizzniess or any other symptoms, is scheduled for f/u next week but would like to know if he should be seen prior to this appt. Will forward to Dr. Purvis Sheffield

## 2015-09-17 NOTE — Telephone Encounter (Signed)
Med list at Sept 2016 ov noted Lasix 40 mg daily. Have him take 40 mg daily with 20 meq KCl daily and have BMET checked 3 days after starting.

## 2015-09-17 NOTE — Telephone Encounter (Signed)
[name] [name] called stating that he has been having swelling in his feet,ankles, and legs. States that more swelling in the right leg, ankle and foot.Called requesting to see If he needs to be seen this week by Dr. Purvis Sheffield.

## 2015-09-19 ENCOUNTER — Ambulatory Visit (INDEPENDENT_AMBULATORY_CARE_PROVIDER_SITE_OTHER): Payer: Medicare Other | Admitting: *Deleted

## 2015-09-19 DIAGNOSIS — I82409 Acute embolism and thrombosis of unspecified deep veins of unspecified lower extremity: Secondary | ICD-10-CM | POA: Diagnosis not present

## 2015-09-19 LAB — POCT INR: INR: 2.5

## 2015-09-25 ENCOUNTER — Ambulatory Visit: Payer: Self-pay | Admitting: Cardiovascular Disease

## 2015-10-07 ENCOUNTER — Ambulatory Visit: Payer: Self-pay | Admitting: Cardiovascular Disease

## 2015-10-09 ENCOUNTER — Ambulatory Visit: Payer: Self-pay | Admitting: Cardiothoracic Surgery

## 2015-10-31 IMAGING — CR DG CHEST 2V
2 series · 2 of 2 positions shown · non-contrast
Comparison: PA and lateral chest of February 11, 2015

CLINICAL DATA: Shortness of breath, gallstone pancreatitis CHF and
acute MI

EXAM:
CHEST  2 VIEW

[chest pa]
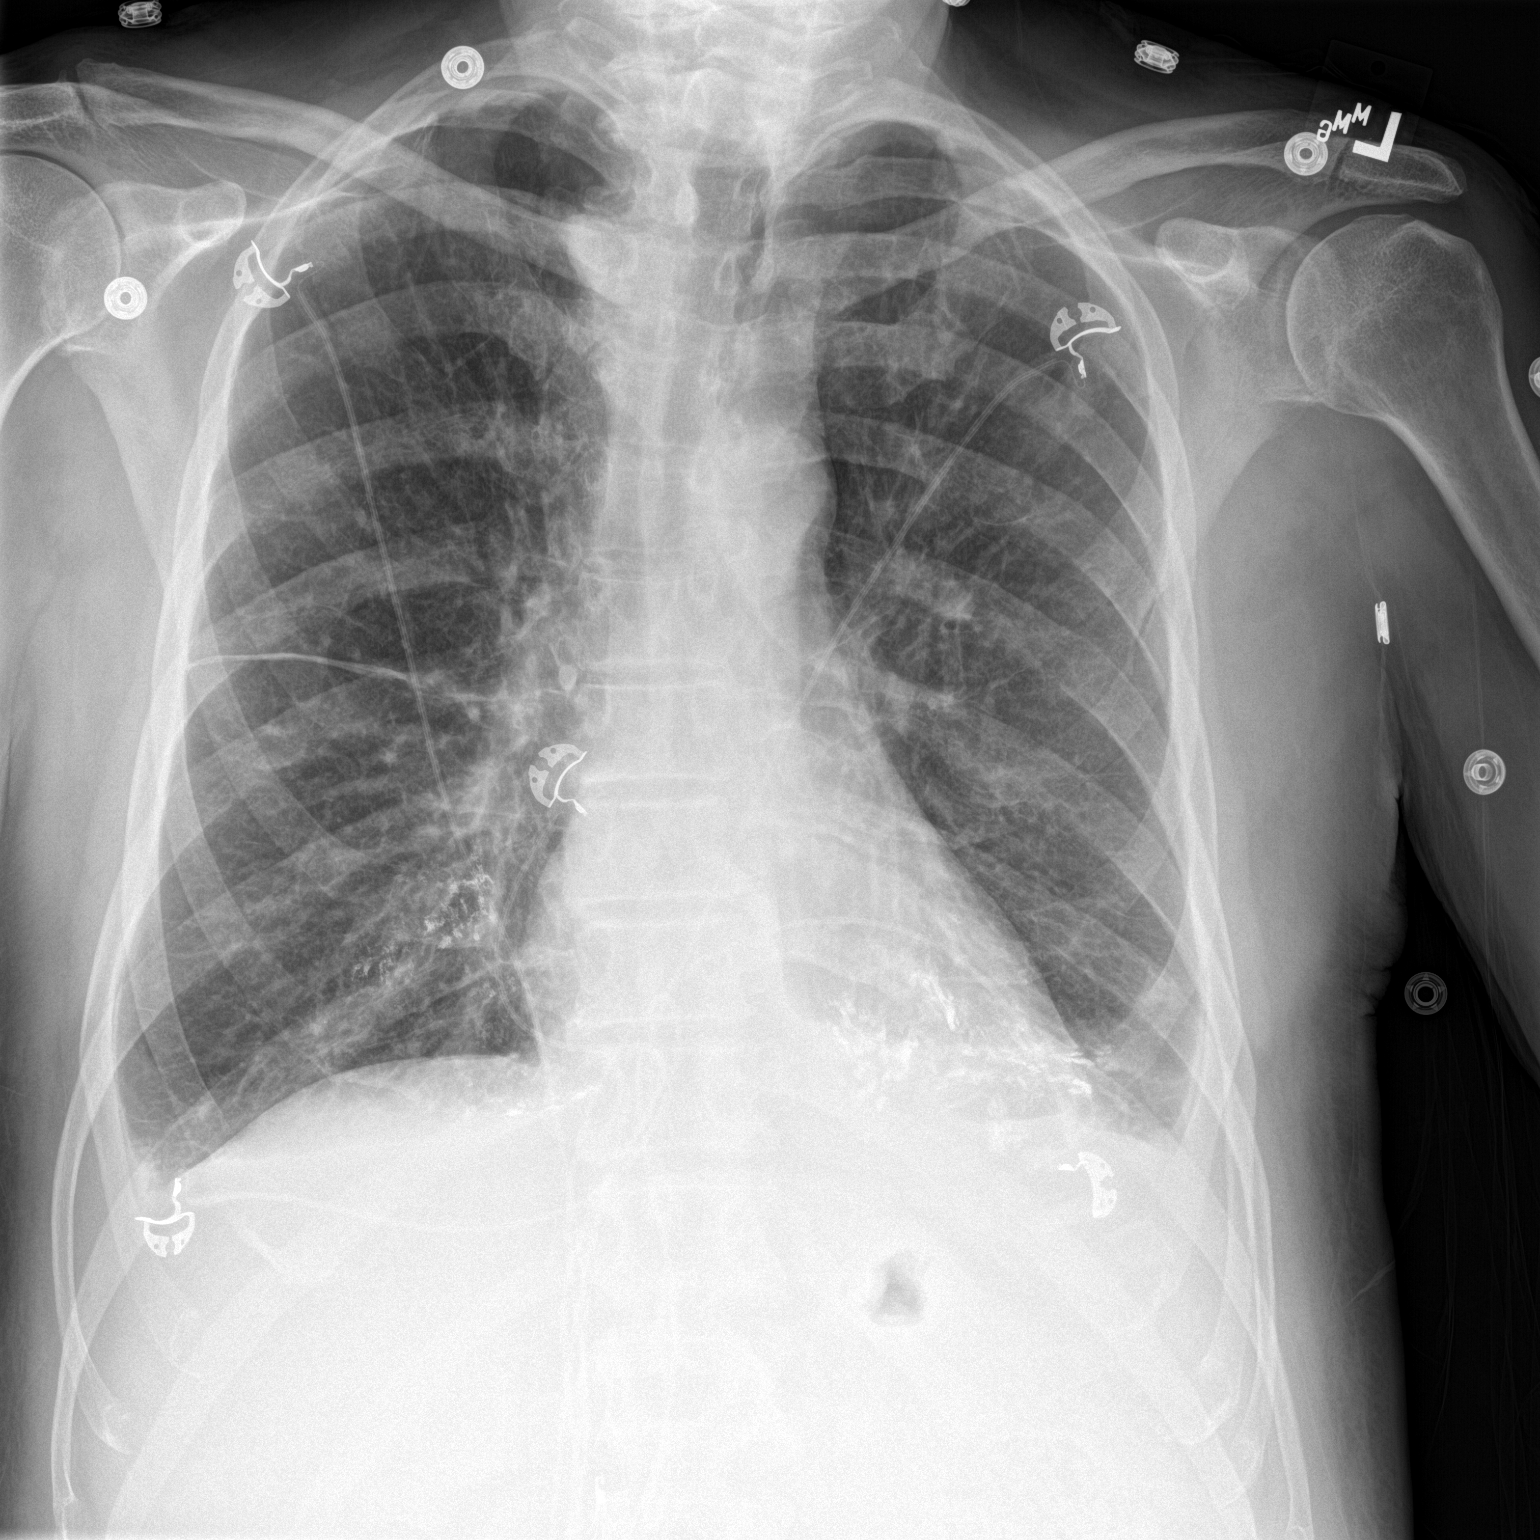

[chest lat]
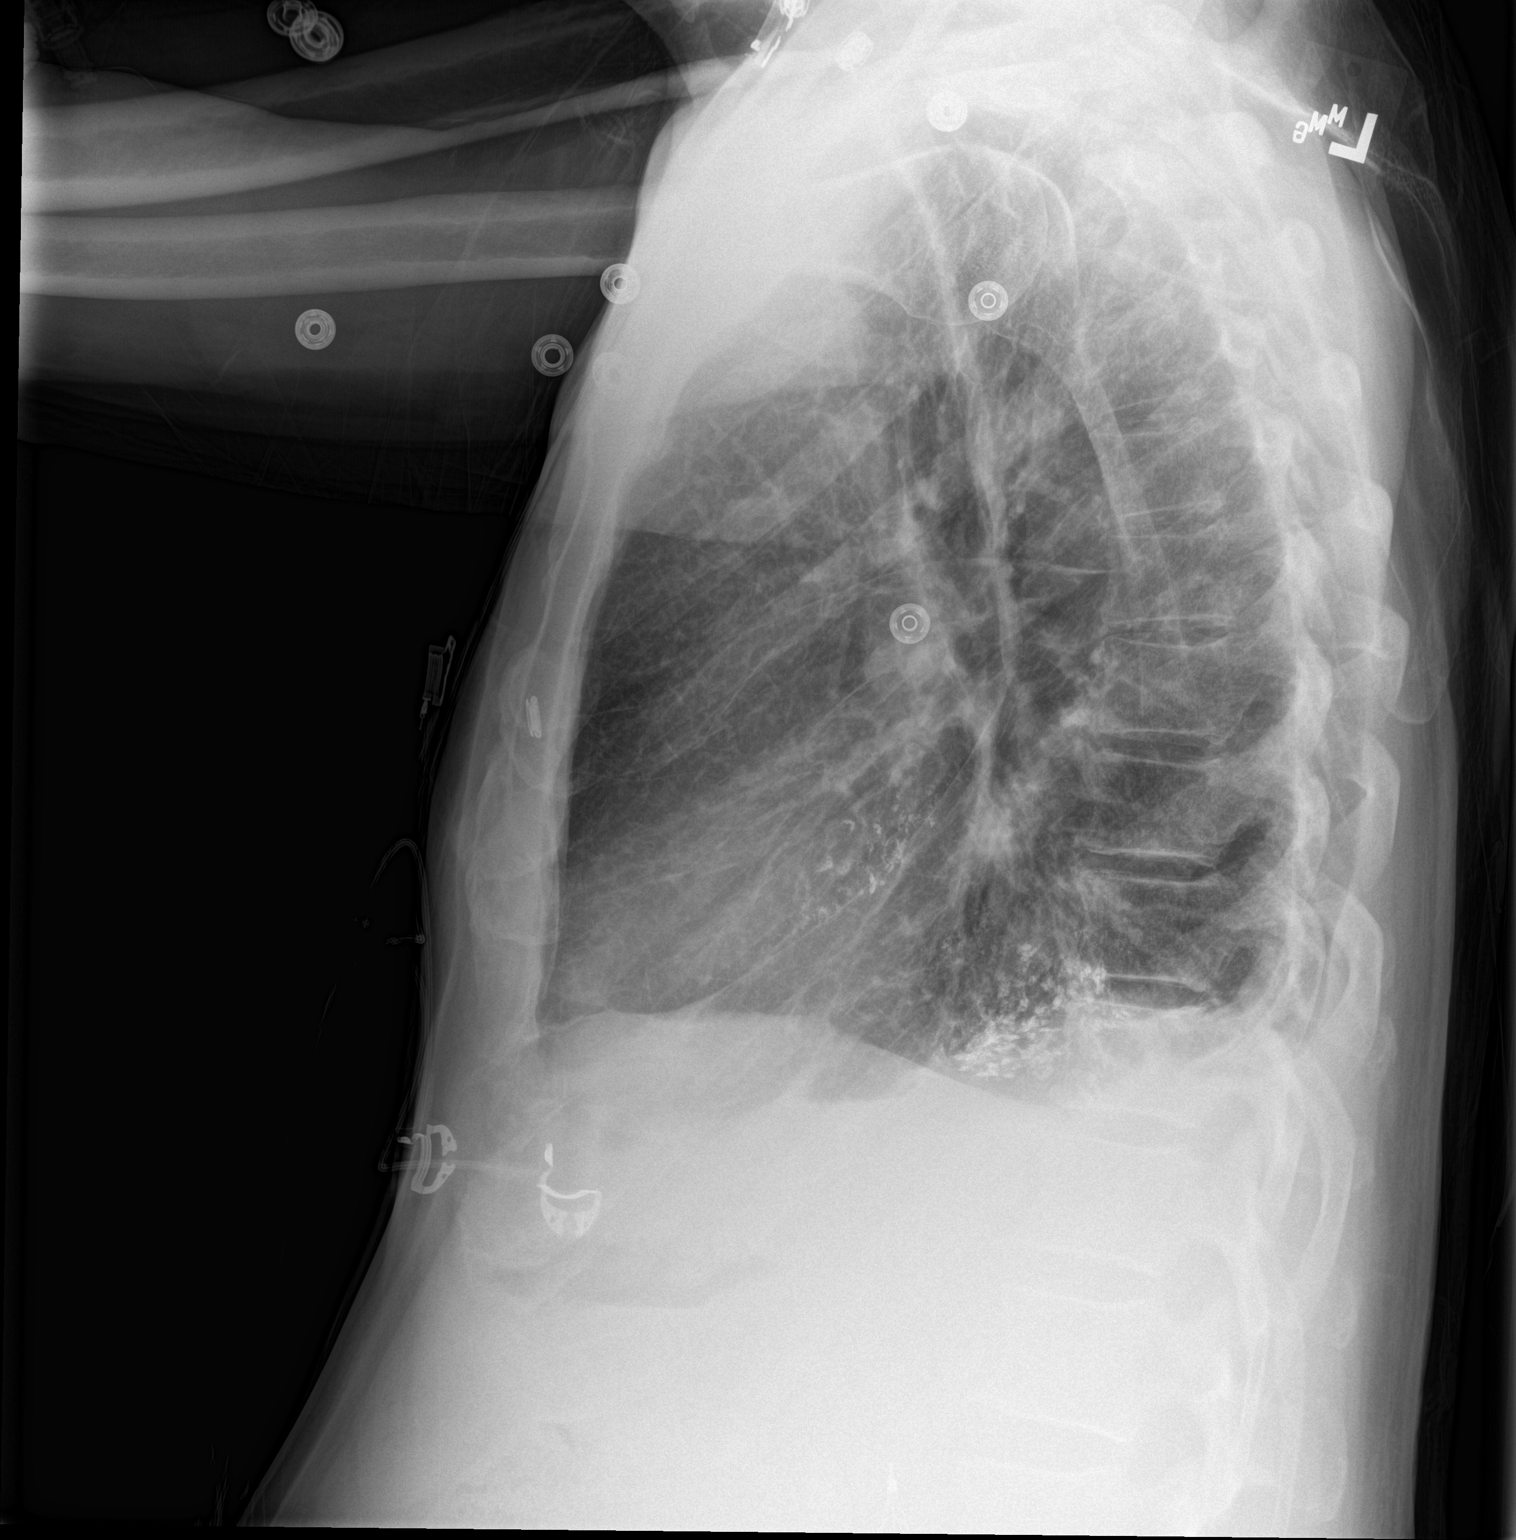

[2 of 2 positions shown; findings below may reference images not displayed]

FINDINGS: The lungs are well-expanded. There is persistent barium within right
middle and bilateral lower lobe bronchi. There is no alveolar
pneumonia. There are small bilateral pleural effusion. The heart and
pulmonary vascularity are normal. The bony thorax is unremarkable.
IMPRESSION: Stable appearance of the chest since yesterday's study. There is
evidence of previously aspirated barium into the lower airways
bilaterally. There remain small pleural effusions.

## 2015-11-02 IMAGING — CR DG CHEST 1V PORT
1 series · 1 of 1 positions shown · non-contrast
Comparison: 02/12/2015

CLINICAL DATA: Coronary artery disease.  Status post CABG.

EXAM:
PORTABLE CHEST - 1 VIEW

[AP]
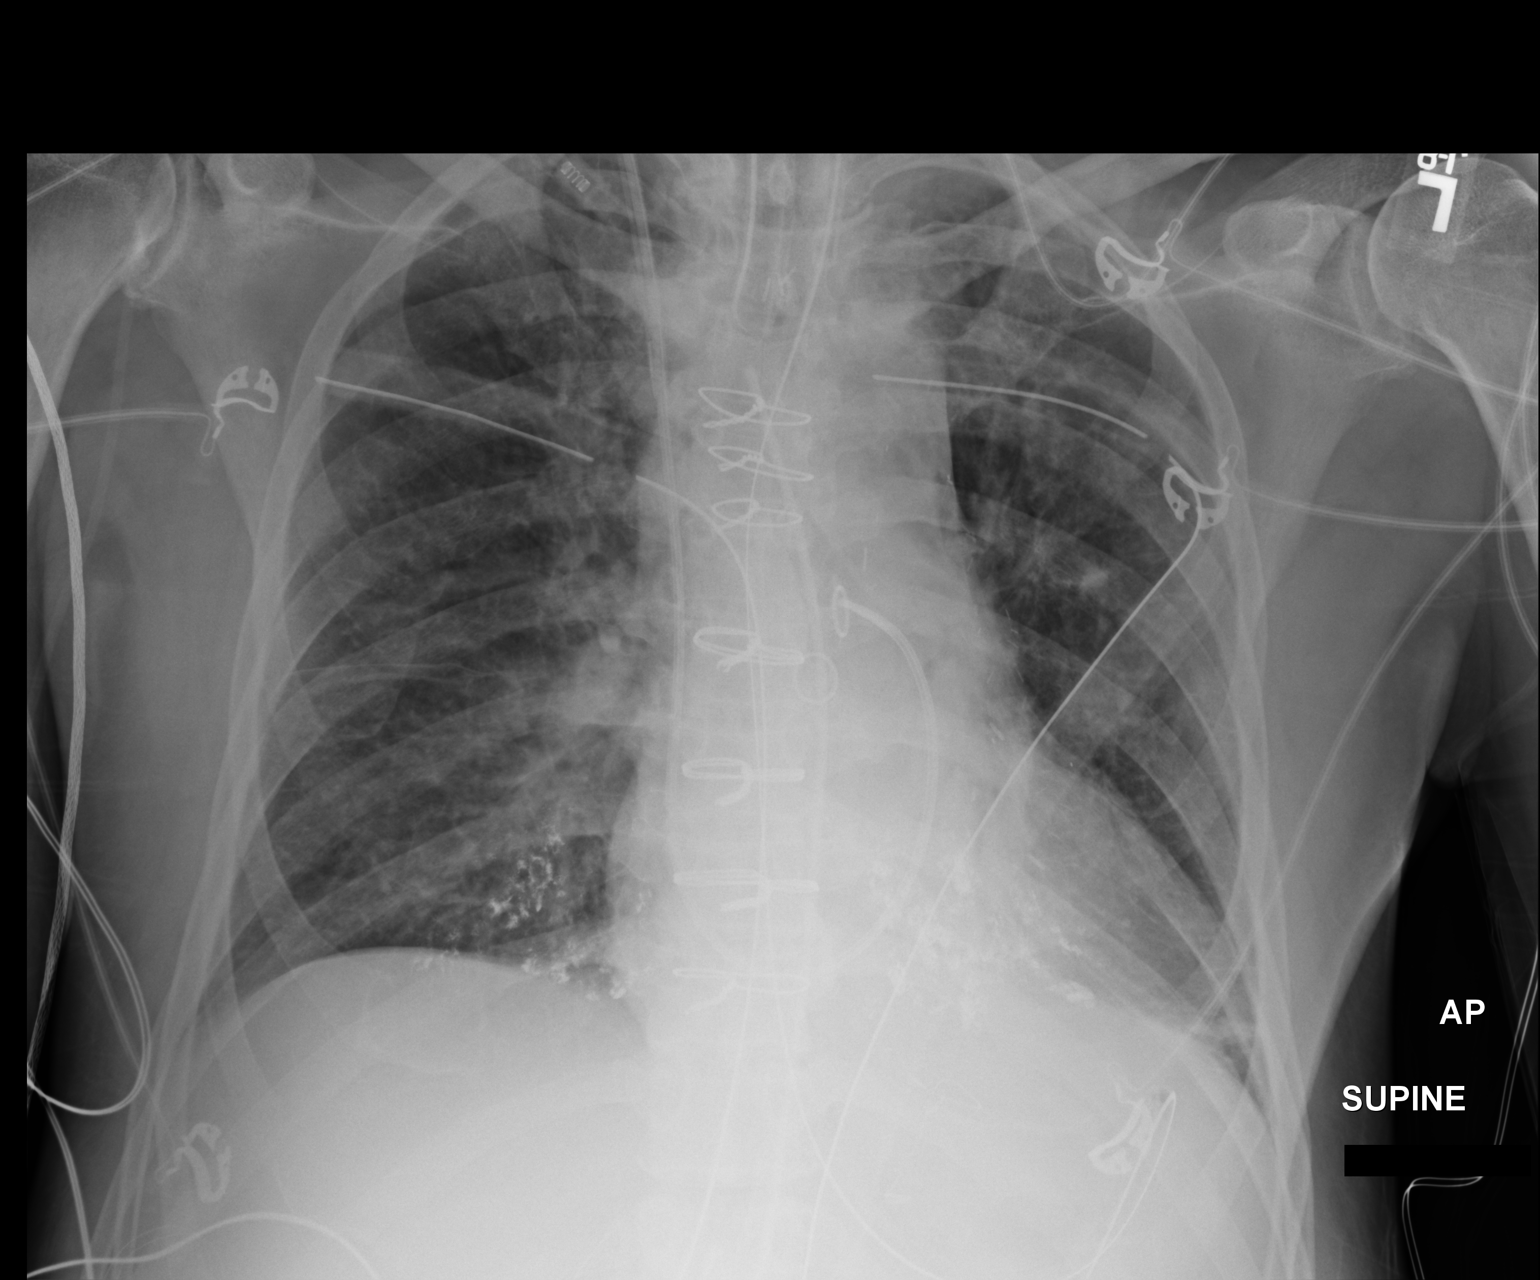

[1 of 1 positions shown; findings below may reference images not displayed]

FINDINGS: Chest tubes, Swan-Ganz catheter and endotracheal tube appear in good
position. No pneumothorax. Heart size and vascularity are normal.
Aspirated barium at the lung bases. Minimal atelectasis at the left
lung base. No visible effusions.
IMPRESSION: Satisfactory postoperative appearance of the chest. Minimal linear
atelectasis at the left base.

## 2015-11-03 IMAGING — CR DG CHEST 1V PORT
1 series · 1 of 1 positions shown · non-contrast
Comparison: 02/14/2015.

CLINICAL DATA: CABG.

EXAM:
PORTABLE CHEST - 1 VIEW

[AP]
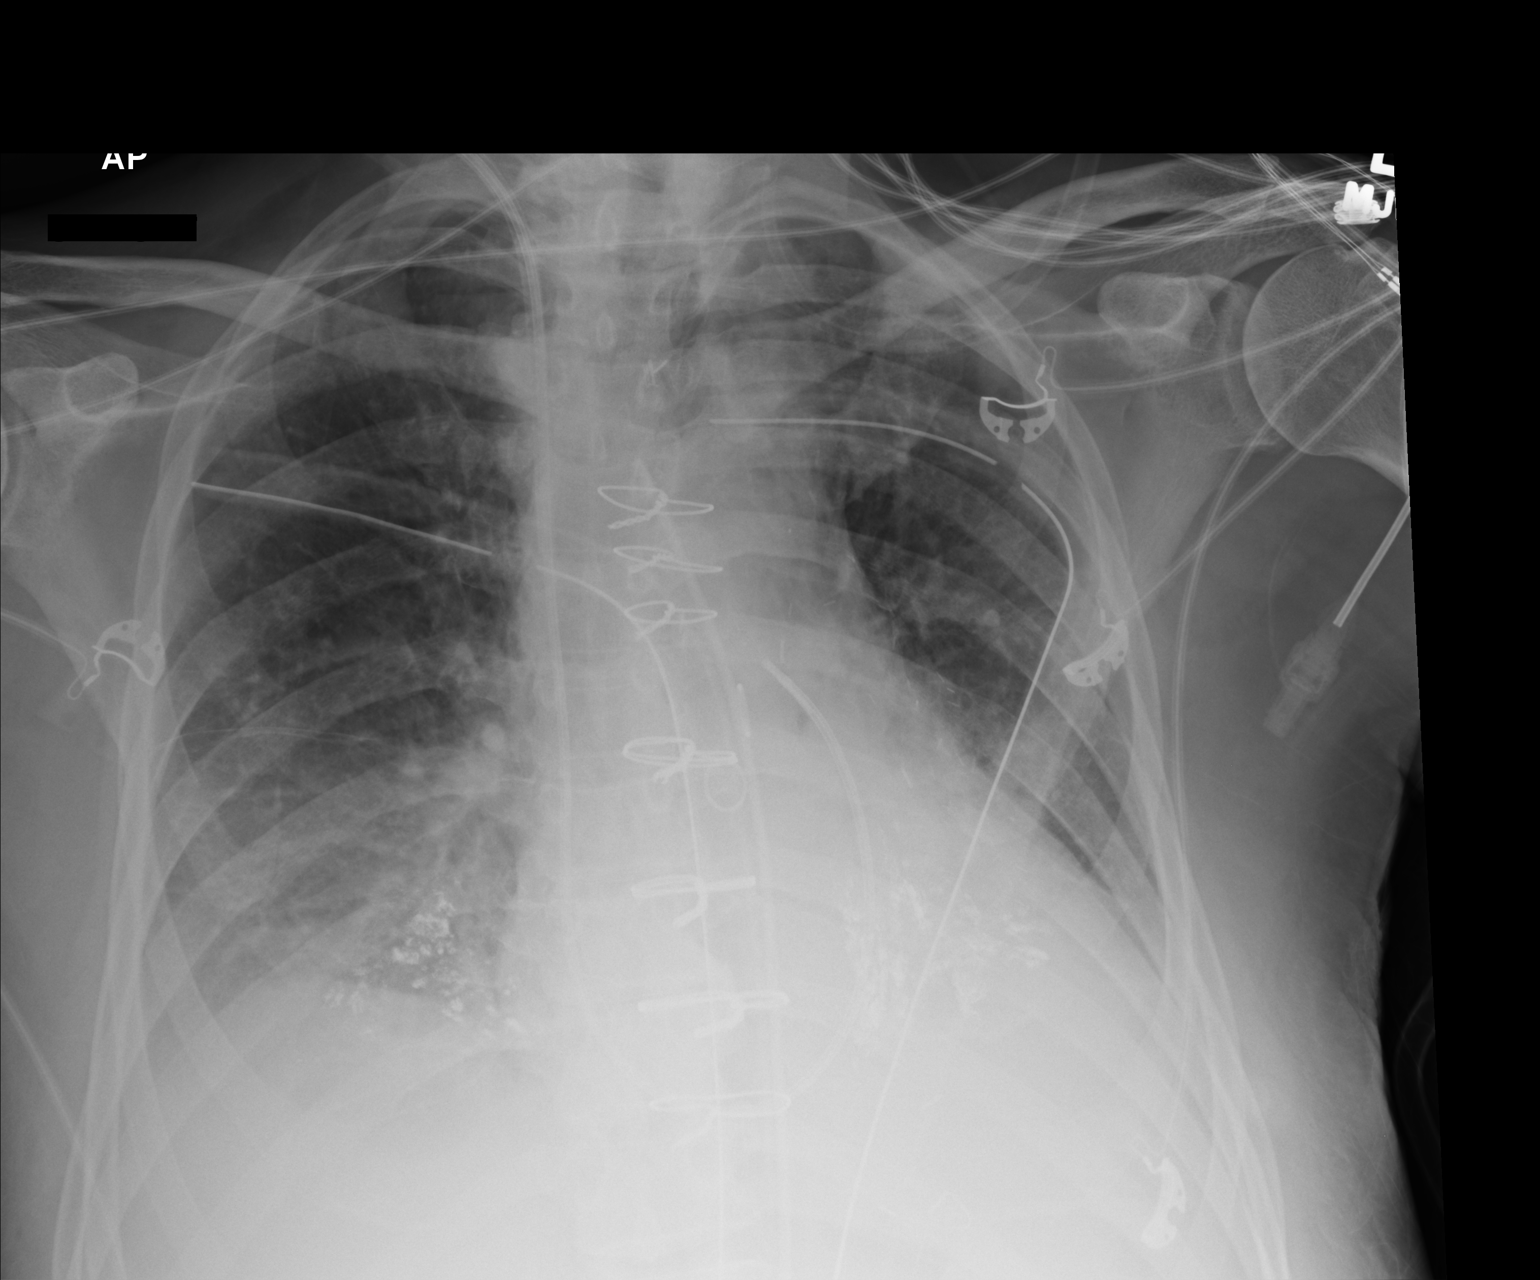

[1 of 1 positions shown; findings below may reference images not displayed]

FINDINGS: Interim extubation removal of NG tube. Swan-Ganz catheter,
mediastinal drainage catheter, bilateral chest tubes are in stable
position. Mediastinum hilar structures stable. CABG. Cardiomegaly.
Bilateral basilar pulmonary alveolar infiltrates and small pleural
effusions are noted. These findings are consistent with congestive
heart failure. Bibasilar pneumonia cannot be excluded. Aspirated
barium the lung bases again noted. No pneumothorax.
IMPRESSION: 1. Interim extubation removal of NG tube. Remaining lines and tubes
including bilateral chest tubes in stable position. No pneumothorax.
2. CABG. Cardiomegaly with bilateral basilar pulmonary alveolar
infiltrates and small pleural effusions consistent with congestive
heart failure. Bibasilar pneumonia cannot be excluded.

## 2015-11-20 IMAGING — CR DG CHEST 2V
2 series · 2 of 2 positions shown · non-contrast
Comparison: February 19, 2015

CLINICAL DATA: Status post coronary artery bypass grafting.
Coronary artery disease.

EXAM:
CHEST  2 VIEW

[w chest pa]
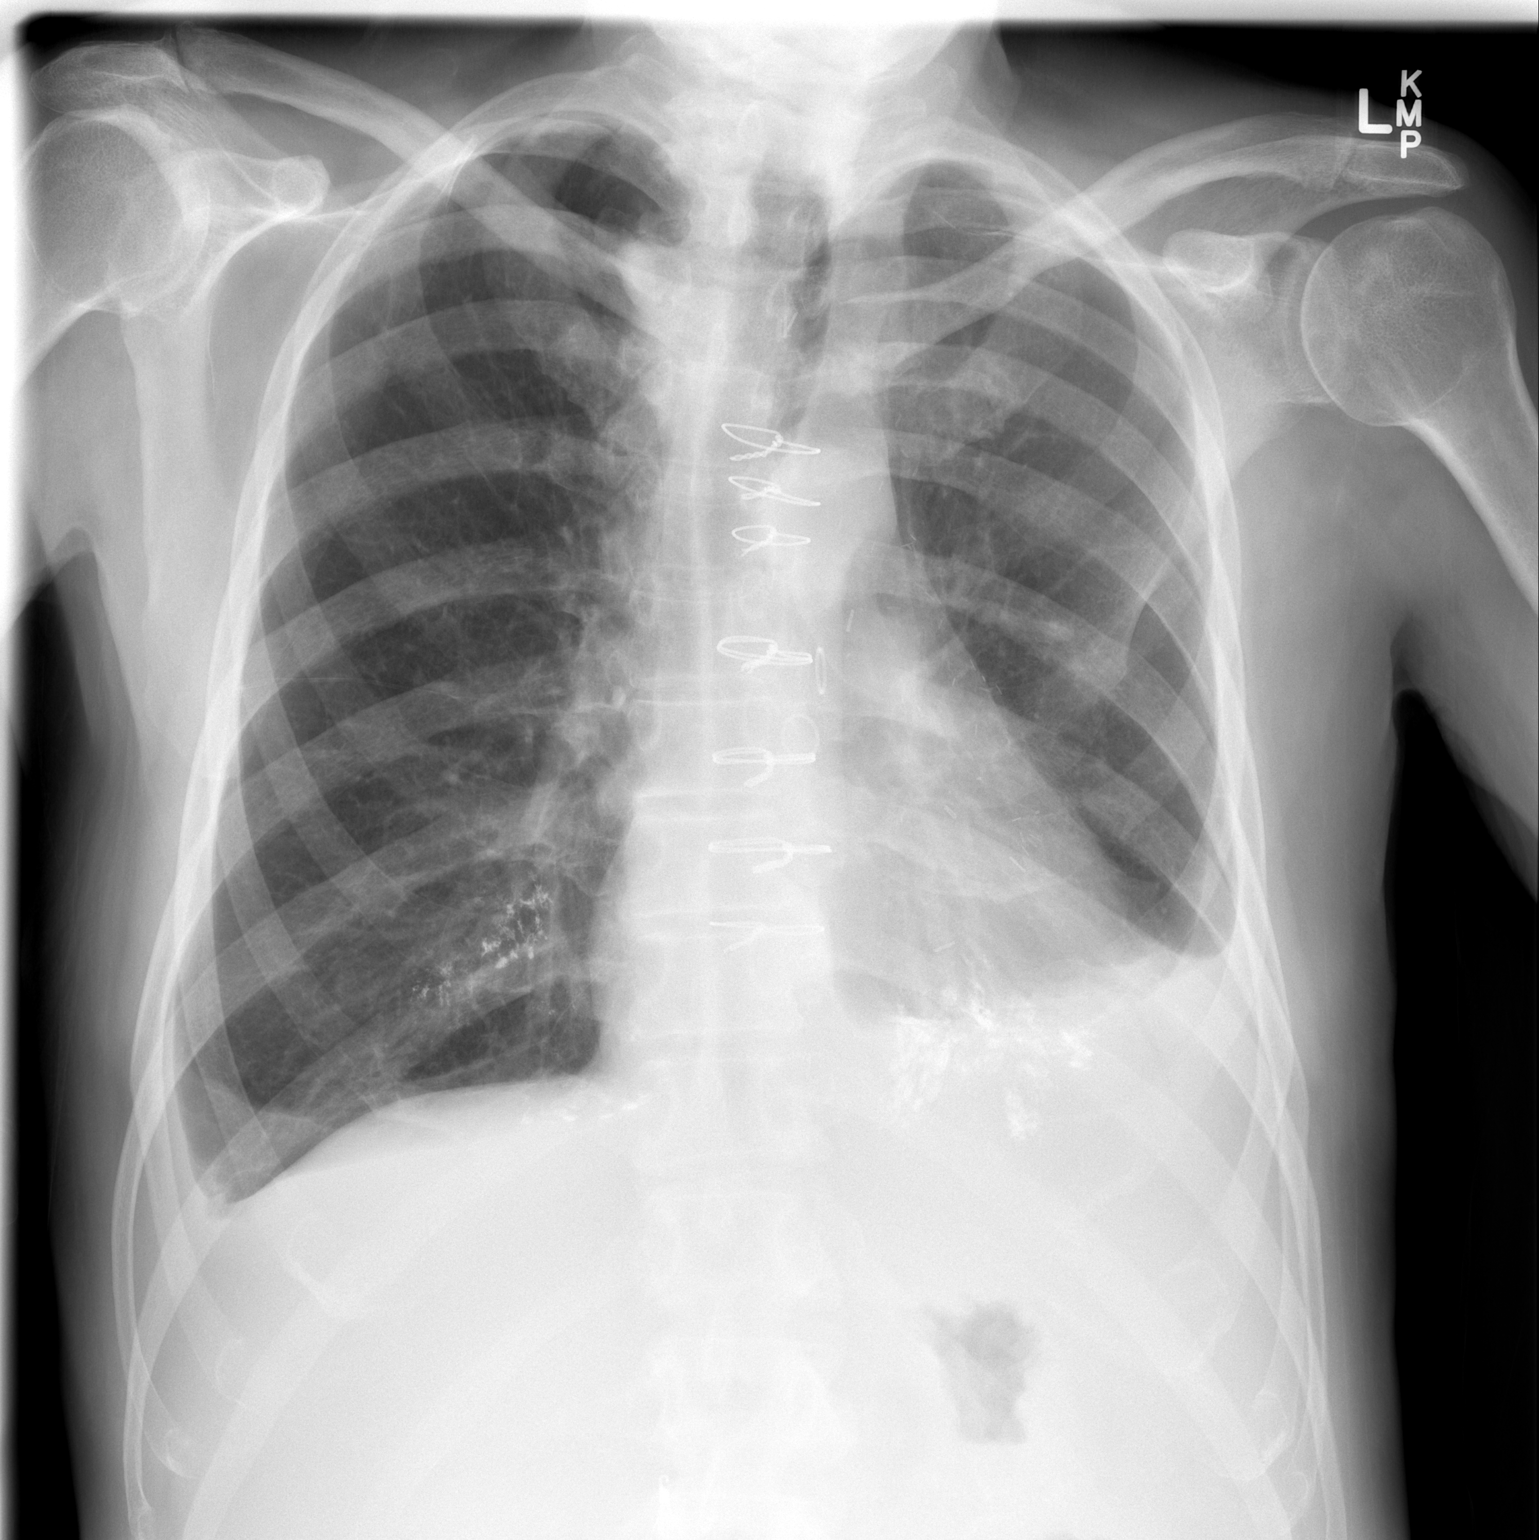

[w chest lat]
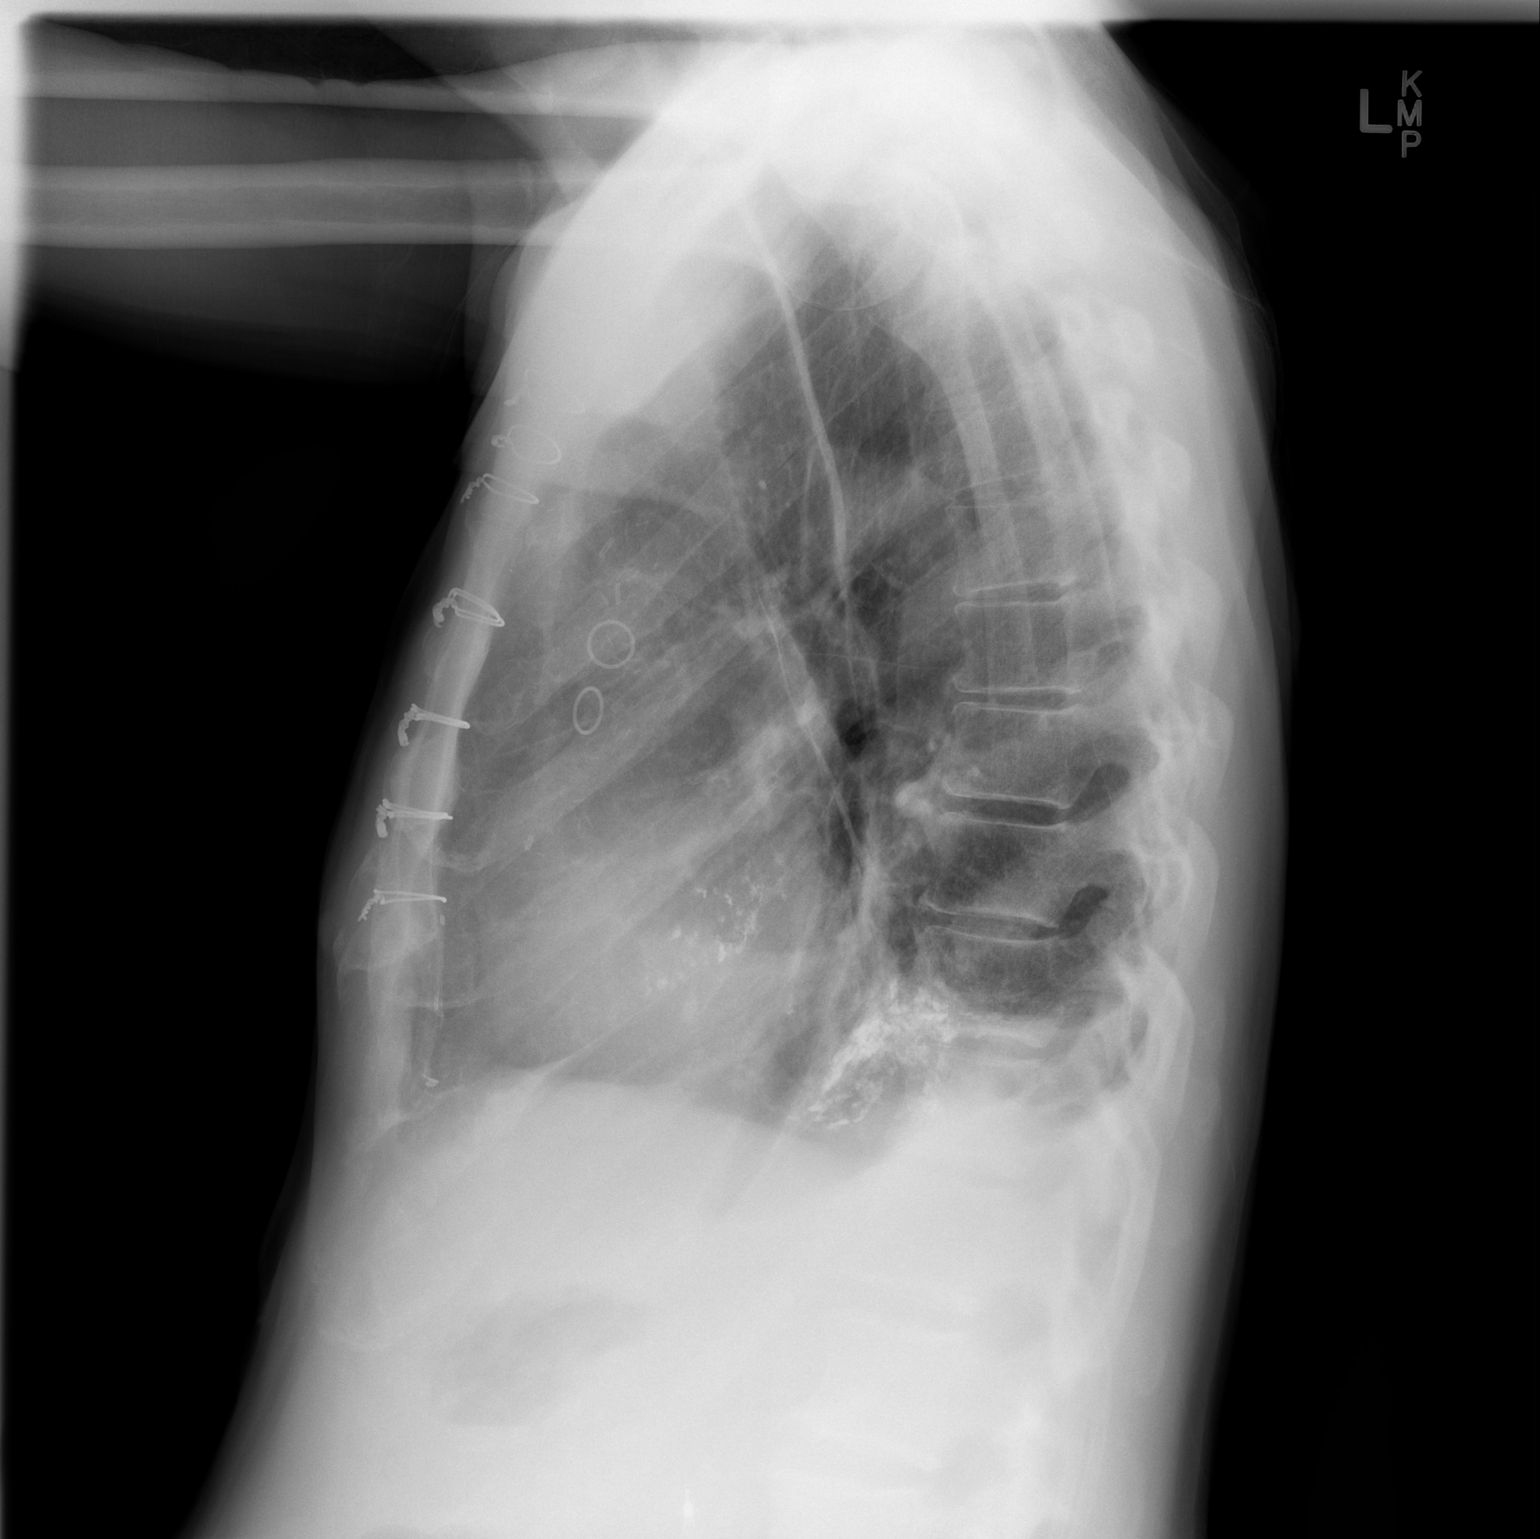

[2 of 2 positions shown; findings below may reference images not displayed]

FINDINGS: Right pleural effusion is smaller compared to 2 weeks prior. There
is a left effusion that does not appear significantly changed.
Aspirated barium in the lung bases remains. There is no frank edema
or consolidation. There is mild bibasilar atelectasis. Heart size
and pulmonary vascularity are normal. No adenopathy. There is
degenerative change in the mid thoracic spine.
IMPRESSION: Right pleural effusion smaller. Left pleural effusion essentially
stable. Stable bibasilar atelectasis. Evidence of contrast
aspiration in the lung bases, stable. No new opacity. No change in
cardiac silhouette.

## 2015-12-11 ENCOUNTER — Ambulatory Visit (INDEPENDENT_AMBULATORY_CARE_PROVIDER_SITE_OTHER): Payer: Medicare Other | Admitting: Cardiovascular Disease

## 2015-12-11 ENCOUNTER — Encounter: Payer: Self-pay | Admitting: Cardiovascular Disease

## 2015-12-11 VITALS — BP 112/68 | HR 98 | Ht 71.0 in | Wt 141.0 lb

## 2015-12-11 DIAGNOSIS — I10 Essential (primary) hypertension: Secondary | ICD-10-CM | POA: Diagnosis not present

## 2015-12-11 DIAGNOSIS — Z7901 Long term (current) use of anticoagulants: Secondary | ICD-10-CM

## 2015-12-11 DIAGNOSIS — I6523 Occlusion and stenosis of bilateral carotid arteries: Secondary | ICD-10-CM

## 2015-12-11 DIAGNOSIS — I252 Old myocardial infarction: Secondary | ICD-10-CM

## 2015-12-11 DIAGNOSIS — E785 Hyperlipidemia, unspecified: Secondary | ICD-10-CM

## 2015-12-11 DIAGNOSIS — I5022 Chronic systolic (congestive) heart failure: Secondary | ICD-10-CM | POA: Diagnosis not present

## 2015-12-11 DIAGNOSIS — I82409 Acute embolism and thrombosis of unspecified deep veins of unspecified lower extremity: Secondary | ICD-10-CM | POA: Diagnosis not present

## 2015-12-11 DIAGNOSIS — Z951 Presence of aortocoronary bypass graft: Secondary | ICD-10-CM

## 2015-12-11 DIAGNOSIS — I25810 Atherosclerosis of coronary artery bypass graft(s) without angina pectoris: Secondary | ICD-10-CM

## 2015-12-11 NOTE — Progress Notes (Signed)
Patient ID: [name] [name], male   DOB: 06-30-1941, 75 y.o.   MRN: [id number]      SUBJECTIVE: The patient presents for routine cardiovascular follow-up. He has a history of CABG, non-STEMI, and chronic systolic heart failure, as well as DVT. Carotid Dopplers on 09/04/15 showed 40-59% right internal carotid artery and 60-79% left internal carotid artery stenosis. He underwent IVC filter retrieval in December 2016. No DVT by lower extremity venous ultrasound on 08/26/15. Echocardiogram in 08/2015 demonstrated mildly reduced left ventricular systolic function, EF 45%, which was an improvement from his prior echo.  He denies chest pain and shortness of breath. He was hospitalized for pancreatitis and has undergone paracentesis.  Review of Systems: As per "subjective", otherwise negative.  No Known Allergies  Current Outpatient Prescriptions  Medication Sig Dispense Refill  . aspirin EC 81 MG EC tablet Take 1 tablet (81 mg total) by mouth daily.    Marland Kitchen atorvastatin (LIPITOR) 40 MG tablet TAKE ONE TABLET BY MOUTH ONCE DAILY IN THE EVENING AT  6PM 30 tablet 6  . Cholecalciferol (VITAMIN D-3) 1000 units CAPS Take by mouth daily.    Marland Kitchen enoxaparin (LOVENOX) 40 MG/0.4ML injection Inject 40 mg into the skin daily.    . Folic Acid-Vit B6-Vit B12 (FOLBEE) 2.5-25-1 MG TABS tablet Take 1 tablet by mouth daily.    Marland Kitchen glucose blood (ONETOUCH VERIO) test strip Use to test blood sugar 2 times daily as instructed. Dx: E11.59 200 each 3  . insulin lispro (HUMALOG KWIKPEN) 100 UNIT/ML KiwkPen Inject 0.02-0.1 mLs (2-10 Units total) into the skin 3 (three) times daily as needed (increased BS). Per sliding scale for blood sugar > 200 units 15 mL 2  . LANTUS SOLOSTAR 100 UNIT/ML Solostar Pen Inject 10 Units into the skin every morning.    . latanoprost (XALATAN) 0.005 % ophthalmic solution Place 1 drop into both eyes at bedtime.    . lipase/protease/amylase (CREON) 36000 UNITS CPEP capsule Take 1 capsule (36,000 Units  total) by mouth 3 (three) times daily before meals. And with snacks (Patient taking differently: Take 24,000 Units by mouth 3 (three) times daily before meals. And with snacks) 150 capsule 3  . metoprolol succinate (TOPROL XL) 25 MG 24 hr tablet Take 0.5 tablets (12.5 mg total) by mouth 2 (two) times daily. 30 tablet 6  . ondansetron (ZOFRAN) 4 MG tablet Take 4 mg by mouth every 8 (eight) hours as needed for nausea or vomiting.    Letta Pate DELICA LANCETS 33G MISC Use to test blood sugar 2 times daily. Dx: E11.59 200 each 3  . Oxandrolone (OXANDRIN PO) Take 5 mLs by mouth. 30 days stop 01/02/16    . pantoprazole (PROTONIX) 40 MG tablet Take 1 tablet (40 mg total) by mouth daily before breakfast. 30 tablet 11  . tamsulosin (FLOMAX) 0.4 MG CAPS capsule Take 0.4 mg by mouth daily.    . vitamin C (ASCORBIC ACID) 500 MG tablet Take 500 mg by mouth daily.    . Zinc 50 MG CAPS Take by mouth.     No current facility-administered medications for this visit.    Past Medical History  Diagnosis Date  . Hypertension   . Diabetes mellitus type 2 in obese (HCC)   . Cholelithiasis   . DVT (deep venous thrombosis) (HCC)   . Cataract   . GERD (gastroesophageal reflux disease)   . MI (myocardial infarction) (HCC) 01/2015  . Pancreatitis 01/2015    Past Surgical History  Procedure Laterality Date  .  Esophagogastroduodenoscopy N/A 01/21/2015    Procedure: ESOPHAGOGASTRODUODENOSCOPY (EGD);  Surgeon: Hilarie Fredrickson, MD;  Location: [facility] ENDOSCOPY;  Service: Endoscopy;  Laterality: N/A;  to be done at BEDSIDE  . Cardiac catheterization N/A 01/17/2015    Procedure: Left Heart Cath and Coronary Angiography;  Surgeon: Lyn Records, MD;  Location: [facility] INVASIVE CV LAB;  Service: Cardiovascular;  Laterality: N/A;  . Esophagogastroduodenoscopy N/A 02/05/2015    Procedure: ESOPHAGOGASTRODUODENOSCOPY (EGD);  Surgeon: Hart Carwin, MD;  Location: [facility] ENDOSCOPY;  Service: Endoscopy;  Laterality: N/A;  . Coronary artery bypass  graft N/A 02/14/2015    Procedure: CORONARY ARTERY BYPASS GRAFTING (CABG) x three, using left internal mammary artery and left leg greater saphenous vein harvested endoscopically;  Surgeon: Kerin Perna, MD;  Location: [facility] OR;  Service: Open Heart Surgery;  Laterality: N/A;  . Tee without cardioversion N/A 02/14/2015    Procedure: TRANSESOPHAGEAL ECHOCARDIOGRAM (TEE);  Surgeon: Kerin Perna, MD;  Location: [facility] OR;  Service: Open Heart Surgery;  Laterality: N/A;    Social History   Social History  . Marital Status: Married    Spouse Name: N/A  . Number of Children: N/A  . Years of Education: N/A   Occupational History  . Not on file.   Social History Main Topics  . Smoking status: Never Smoker   . Smokeless tobacco: Never Used  . Alcohol Use: No  . Drug Use: No  . Sexual Activity: Yes   Other Topics Concern  . Not on file   Social History Narrative     Filed Vitals:   12/11/15 1442  BP: 112/68  Pulse: 98  Height: 5\' 11"  (1.803 m)  Weight: 141 lb (63.957 kg)  SpO2: 99%    PHYSICAL EXAM General: NAD HEENT: Normal. Neck: No JVD, no thyromegaly. Lungs: Clear to auscultation bilaterally with normal respiratory effort. CV: Nondisplaced PMI.  Regular rate and rhythm, normal S1/S2, no S3/S4, no murmur. No pretibial or periankle edema.     Abdomen: Soft, no distention.  Neurologic: Alert and oriented.  Psych: Normal affect.  ECG: Most recent ECG reviewed.      ASSESSMENT AND PLAN: 1. CAD with 3-vessel CABG and non-STEMI: Symptomatically stable. Will continue aspirin, Lipitor, and metoprolol succinate. No longer on ACEI.  2. Chronic systolic heart failure/ischemic cardiomyopathy, EF 45%: No longer on Lasix 40 mg daily. Continue metoprolol succinate. No longer on lisinopril.  3. DVT: On warfarin. IVC filter removed in 08/2015. I feel both Lovenox and warfarin can safely be stopped.  4. Hyperlipidemia: On Lipitor 40 mg.  5. Essential HTN: Controlled. No  changes.  6. Bilateral carotid artery stenosis: Repeat in July 2017. Continue ASA and statin.  Dispo: f/u 6 months.   Prentice Docker, M.D., F.A.C.C.

## 2015-12-11 NOTE — Patient Instructions (Signed)
Your physician has recommended you make the following change in your medication:  Stop lovenox. Stop warfarin. Continue all other medications the same. Your physician has requested that you have a carotid duplex in July 2017. This test is an ultrasound of the carotid arteries in your neck. It looks at blood flow through these arteries that supply the brain with blood. Allow one hour for this exam. There are no restrictions or special instructions. Your physician recommends that you schedule a follow-up appointment in: 6 months. You will receive a reminder letter in the mail in about 4 months reminding you to call and schedule your appointment. If you don't receive this letter, please contact our office.

## 2015-12-12 ENCOUNTER — Encounter: Payer: Self-pay | Admitting: Internal Medicine

## 2015-12-12 ENCOUNTER — Ambulatory Visit (INDEPENDENT_AMBULATORY_CARE_PROVIDER_SITE_OTHER): Payer: Medicare Other | Admitting: Internal Medicine

## 2015-12-12 ENCOUNTER — Other Ambulatory Visit (INDEPENDENT_AMBULATORY_CARE_PROVIDER_SITE_OTHER): Payer: Medicare Other | Admitting: *Deleted

## 2015-12-12 VITALS — BP 110/60 | HR 72 | Temp 97.5°F | Resp 14 | Wt 141.0 lb

## 2015-12-12 DIAGNOSIS — E1159 Type 2 diabetes mellitus with other circulatory complications: Secondary | ICD-10-CM

## 2015-12-12 DIAGNOSIS — Z794 Long term (current) use of insulin: Secondary | ICD-10-CM

## 2015-12-12 LAB — POCT GLYCOSYLATED HEMOGLOBIN (HGB A1C): HEMOGLOBIN A1C: 5.6

## 2015-12-12 NOTE — Patient Instructions (Signed)
Please start Lantus 6 units in am. Change Humalog sliding scale as follows: 150-200: + 1 unit 201-250: + 2 units >251: + 3 units  Please return in 1.5 months with your sugar log.

## 2015-12-12 NOTE — Progress Notes (Signed)
Patient ID: [name] [name], male   DOB: [birth year], 75 y.o.   MRN: [id number]  HPI: [name] [name] is a 75 y.o.-year-old male, initially referred by his gastroenterologist, Dr Leone Payor, returning for follow-up for DM2, dx in ~2006, insulin-dependent, uncontrolled, with complications (CAD, s/p AMI, s/p CABG; CKD). Last visit 3 mo ago.   He had complications from his pancreatitis >> admitted >> then in rehab for 2 weeks >> G tube and NPO >> will go home soon.   Sugars were very fluctuating in the hospital: lows - 30s, and few highs: 500.  Reviewed hx: Patient was admitted in 01/2015 with acute biliary pancreatitis secondary to gallstones and he had a very complicated hospitalization. He had an MI and had to have 3 vessel CABG during that hospitalization. He also developed esophagitis with GI bleeding. He is now slowly improving and continues to see Dr. Leone Payor for his pancreatitis. On the latest CT scan, there was a peripancreatic fluid collection, ? Necrosis. He has diarrhea >> started Creon >> a little better.   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  He had cholecystectomy in 07/2015 and had 3 ERCPs and had the IVC filter extracted.  Last hemoglobin A1c was: Lab Results  Component Value Date   HGBA1C 6.3 09/12/2015   HGBA1C 6.9 05/01/2015   HGBA1C 7.5* 01/13/2015   Pt was on a regimen of: - Metformin 500 mg 2x a day, with meals (losing weight on a higher dose) - Lantus 8 >> 10 units in am - Humalog SSI units 3x a day, before meals: 201-250: + 2 units 251-300: + 3 units > 300: + 4 units  Now, since starting tube feeds (continuous - Vital 1.5) - Humalog target 100, ISF 50  He was on Glipizide before the pancreatitis. He was on Januvia 6 mo  ago.  Pt checks his sugars 3x a day and they are: - 12 am: 159-205 - 6 am: 200-210 - 12 pm: 95 (off TFs)  Previously: - am: 108, 122-170, 180, 220 >> 90-140, 160, 190  - 2h after b'fast: n/c - before lunch: 187-271 >> 186-210 - 2h after lunch: n/c - before dinner: 113-236 >> 147-201 - 2h after dinner: n/c - bedtime: n/c - nighttime: n/c No lows. Lowest sugar was 70 (on Glipizide) >> 80s >> 90; he has hypoglycemia awareness at 70.  Highest sugar was 270s >> 271 >> 290.  Before tube feeds, Pt's meals were: - Breakfast: cereal + strawberries (Special K) + oatmeal; toast + eggs - Lunch: potatoes, casseroles, chicken - Dinner: sandwich or soup, used to eat out - Snacks: yoghurt, cheese; no snacks at night  - + CKD, last BUN/creatinine:  Lab Results  Component Value Date   BUN 15 08/26/2015   CREATININE 0.93 08/26/2015  He is on lisinopril. - last set of lipids: Lab Results  Component Value Date   CHOL 104 01/15/2015   HDL 26* 01/15/2015   LDLCALC 64 01/15/2015   TRIG 195* 01/19/2015   CHOLHDL 4.0 01/15/2015  He is on Lipitor. - last eye exam was in 04/2015. No DR.  - no numbness and tingling in his feet.  ROS: Constitutional: no weight loss, no fatigue, no subjective hyperthermia/hypothermia Eyes: no blurry vision, no xerophthalmia ENT: no sore  throat, no nodules palpated in throat, no dysphagia/odynophagia, no hoarseness Cardiovascular: no CP/SOB/palpitations/leg swelling Respiratory: no cough/SOB Gastrointestinal: no N/V/D/C Musculoskeletal: no muscle/joint aches Skin: no rashes Neurological: no tremors/numbness/tingling/dizziness  I reviewed pt's medications, allergies, PMH, social hx, family hx, and changes were documented in the history of present illness. Otherwise, unchanged from my initial visit note.  Past Medical History  Diagnosis Date  . Hypertension   . Diabetes mellitus type 2 in obese (HCC)   . Cholelithiasis   . DVT (deep venous thrombosis)  (HCC)   . Cataract   . GERD (gastroesophageal reflux disease)   . MI (myocardial infarction) (HCC) 01/2015  . Pancreatitis 01/2015   Past Surgical History  Procedure Laterality Date  . Esophagogastroduodenoscopy N/A 01/21/2015    Procedure: ESOPHAGOGASTRODUODENOSCOPY (EGD);  Surgeon: Hilarie Fredrickson, MD;  Location: [facility] ENDOSCOPY;  Service: Endoscopy;  Laterality: N/A;  to be done at BEDSIDE  . Cardiac catheterization N/A 01/17/2015    Procedure: Left Heart Cath and Coronary Angiography;  Surgeon: Lyn Records, MD;  Location: [facility] INVASIVE CV LAB;  Service: Cardiovascular;  Laterality: N/A;  . Esophagogastroduodenoscopy N/A 02/05/2015    Procedure: ESOPHAGOGASTRODUODENOSCOPY (EGD);  Surgeon: Hart Carwin, MD;  Location[facility] ENDOSCOPY;  Service: Endoscopy;  Laterality: N/A;  . Coronary artery bypass graft N/A 02/14/2015    Procedure: CORONARY ARTERY BYPASS GRAFTING (CABG) x three, using left internal mammary artery and left leg greater saphenous vein harvested endoscopically;  Surgeon: Kerin Perna, MD;  Location: [facility] OR;  Service: Open Heart Surgery;  Laterality: N/A;  . Tee without cardioversion N/A 02/14/2015    Procedure: TRANSESOPHAGEAL ECHOCARDIOGRAM (TEE);  Surgeon: Kerin Perna, MD;  Location: [facility] OR;  Service: Open Heart Surgery;  Laterality: N/A;   Social History   Social History  . Marital Status: Married    Spouse Name: N/A  . Number of Children: N/A   Social History Main Topics  . Smoking status: Never Smoker   . Smokeless tobacco: Never Used  . Alcohol Use: No  . Drug Use: No   Current Outpatient Prescriptions on File Prior to Visit  Medication Sig Dispense Refill  . aspirin EC 81 MG EC tablet Take 1 tablet (81 mg total) by mouth daily.    Marland Kitchen atorvastatin (LIPITOR) 40 MG tablet TAKE ONE TABLET BY MOUTH ONCE DAILY IN THE EVENING AT  6PM 30 tablet 6  . Cholecalciferol (VITAMIN D-3) 1000 units CAPS Take by mouth daily.    . Folic Acid-Vit B6-Vit B12 (FOLBEE) 2.5-25-1 MG TABS tablet  Take 1 tablet by mouth daily.    Marland Kitchen glucose blood (ONETOUCH VERIO) test strip Use to test blood sugar 2 times daily as instructed. Dx: E11.59 200 each 3  . insulin lispro (HUMALOG KWIKPEN) 100 UNIT/ML KiwkPen Inject 0.02-0.1 mLs (2-10 Units total) into the skin 3 (three) times daily as needed (increased BS). Per sliding scale for blood sugar > 200 units 15 mL 2  . LANTUS SOLOSTAR 100 UNIT/ML Solostar Pen Inject 10 Units into the skin every morning.    . latanoprost (XALATAN) 0.005 % ophthalmic solution Place 1 drop into both eyes at bedtime.    . lipase/protease/amylase (CREON) 36000 UNITS CPEP capsule Take 1 capsule (36,000 Units total) by mouth 3 (three) times daily before meals. And with snacks (Patient taking differently: Take 24,000 Units by mouth 3 (three) times daily before meals. And with snacks) 150 capsule 3  . metoprolol succinate (TOPROL XL) 25 MG 24 hr tablet Take 0.5 tablets (  12.5 mg total) by mouth 2 (two) times daily. 30 tablet 6  . ondansetron (ZOFRAN) 4 MG tablet Take 4 mg by mouth every 8 (eight) hours as needed for nausea or vomiting.    Letta Pate DELICA LANCETS 33G MISC Use to test blood sugar 2 times daily. Dx: E11.59 200 each 3  . Oxandrolone (OXANDRIN PO) Take 5 mLs by mouth. 30 days stop 01/02/16    . pantoprazole (PROTONIX) 40 MG tablet Take 1 tablet (40 mg total) by mouth daily before breakfast. 30 tablet 11  . tamsulosin (FLOMAX) 0.4 MG CAPS capsule Take 0.4 mg by mouth daily.    . vitamin C (ASCORBIC ACID) 500 MG tablet Take 500 mg by mouth daily.    . Zinc 50 MG CAPS Take by mouth.     No current facility-administered medications on file prior to visit.   No Known Allergies Family History  Problem Relation Age of Onset  . Heart attack Brother 55  . Pancreatic cancer Sister 71    died in her 73s.    PE: BP 110/60 mmHg  Pulse 72  Temp(Src) 97.5 F (36.4 C) (Oral)  Resp 14  Wt 141 lb (63.957 kg)  SpO2 98% Body mass index is 19.67 kg/(m^2).  Wt Readings from  Last 3 Encounters:  12/12/15 141 lb (63.957 kg)  12/11/15 141 lb (63.957 kg)  09/12/15 133 lb (60.328 kg)   Constitutional: thin, in NAD Eyes: PERRLA, EOMI, no exophthalmos ENT: moist mucous membranes, no thyromegaly, no cervical lymphadenopathy Cardiovascular: RRR, No MRG, + LE pitting edema B Respiratory: CTA B Gastrointestinal: abdomen soft, NT, ND, BS+ Musculoskeletal: no deformities, strength intact in all 4 Skin: moist, warm, no rashes Neurological: no tremor with outstretched hands, DTR normal in all 4  ASSESSMENT: 1. DM2, insulin-dependent, uncontrolled, with complications - CAD, s/p AMI, s/p 3v CABG - CKD  PLAN:  1. Patient with insulin-dependent diabetes, previously on oral antidiabetic regimen (Metformin) + basal insulin + rarely SSI rapid acting insulin analog. Since last visit, he was again admitted with pancreatitis complications and is now on continuous tube feeds. He is still in the nursing facility but will go home in a week. His sugars appear to be higher, between 150s and 200s. He is not on Lantus or metformin anymore, only on sliding scale. I discussed with him, his wife, and his daughter, to restart the Lantus at the lower dose and only start a sliding scale if sugars higher than 150s. After he gets home, we may even be able to stop the Lantus and just continue on metformin. - I suggested to:  Patient Instructions  Please start Lantus 6 units in am. Change Humalog sliding scale as follows: 150-200: + 1 unit 201-250: + 2 units >251: + 3 units  Please return in 1.5 months with your sugar log.   - continue checking sugars at different times of the day  - will check hemoglobin A1c >> 5.6% (improved) - Return to clinic in 1.5 mo with sugar log

## 2015-12-16 ENCOUNTER — Telehealth: Payer: Self-pay | Admitting: *Deleted

## 2015-12-16 NOTE — Telephone Encounter (Signed)
Pt's daughter, Brita Romp, called with pt's blood sugar readings, (since starting the Lantus):  4/7   4/8   4/9   4/10  203 5:45 AM 211 5:36 AM 240 5:57 AM 160 5:33 AM 161 4:56 PM 206 11:43 AM 198 12 PM  163 12 PM 179 11 PM  151 4:39 PM 182 4:35 PM    196 10:21 PM 103 9:47 PM    193 11:24 PM  Please advise of any medication dose changes. Thank you.

## 2015-12-17 ENCOUNTER — Encounter: Payer: Self-pay | Admitting: *Deleted

## 2015-12-17 NOTE — Telephone Encounter (Signed)
Can you please send that? Thank you, c

## 2015-12-17 NOTE — Telephone Encounter (Signed)
Please increase Lantus to 8 units daily and let me know about the sugars in few days.

## 2015-12-17 NOTE — Telephone Encounter (Signed)
Called pt's daughter and advised her. She stated that pt is in rehab at [facility] in [place]. She asked for the medication change to be sent as an order (letter) to them. Fax# [phone].

## 2015-12-17 NOTE — Telephone Encounter (Signed)
Faxed order to [facility] at # [phone].

## 2016-01-01 ENCOUNTER — Telehealth: Payer: Self-pay | Admitting: Internal Medicine

## 2016-01-01 NOTE — Telephone Encounter (Signed)
Requested a call back from the pt's daughter. 

## 2016-01-01 NOTE — Telephone Encounter (Signed)
Pt daughter needs call back please to give BS readings

## 2016-01-09 ENCOUNTER — Telehealth: Payer: Self-pay | Admitting: Internal Medicine

## 2016-01-09 NOTE — Telephone Encounter (Signed)
Patient daughter [name] [name] called stated patient is taking liquid food by mouth, 8 hr during the day

## 2016-01-09 NOTE — Telephone Encounter (Signed)
Left message to return call 

## 2016-01-09 NOTE — Telephone Encounter (Signed)
Call back needs to be to [place] at [phone]

## 2016-01-10 NOTE — Telephone Encounter (Signed)
See note below and please advise, Thanks! 

## 2016-01-10 NOTE — Telephone Encounter (Signed)
Ok, noted! Please let us know about the sugars in a few days.

## 2016-01-10 NOTE — Telephone Encounter (Signed)
I contacted the pt's daughter. Per Dr. Elvera Lennox she would like the pt to continue taking the blood sugar readings and call on Monday or Tuesday to report.

## 2016-01-10 NOTE — Telephone Encounter (Signed)
I contacted the pt's daughter and advised of note below. She voiced understanding.  

## 2016-01-14 ENCOUNTER — Telehealth: Payer: Self-pay | Admitting: Internal Medicine

## 2016-01-14 ENCOUNTER — Ambulatory Visit: Payer: Self-pay | Admitting: *Deleted

## 2016-01-14 NOTE — Telephone Encounter (Signed)
Please read message below and advise.  

## 2016-01-14 NOTE — Telephone Encounter (Signed)
01/12/16: 274 AM, 147 Lunch, 178 PM 01/13/16: 229 AM, 138 Afternoon, 184 PM 01/14/16: AM 277, Lunch 175   PT Tube Feeds from 4PM-8AM, he 'grazes' during the day, he just was allowed to start eating again not too long ago. PT daughter said if you need to call back you can reach her on her cell

## 2016-01-14 NOTE — Telephone Encounter (Signed)
If he is still on Lantus 8 units, he will need to increase >> 10 and even 12 units. Please let me know how it goes. Is he still on the Metformin?

## 2016-01-15 NOTE — Telephone Encounter (Signed)
Called pt's daughter and lvm advising her per Dr Charlean Sanfilippo message. Advised her to return call to advise if pt is still on the Metformin.

## 2016-01-16 ENCOUNTER — Telehealth: Payer: Self-pay | Admitting: Internal Medicine

## 2016-01-16 ENCOUNTER — Other Ambulatory Visit: Payer: Self-pay | Admitting: Internal Medicine

## 2016-01-16 MED ORDER — INSULIN NPH (HUMAN) (ISOPHANE) 100 UNIT/ML ~~LOC~~ SUSP: SUBCUTANEOUS | Status: DC

## 2016-01-16 NOTE — Telephone Encounter (Signed)
Patient daughter Burnell Blanks is returning your call

## 2016-01-16 NOTE — Telephone Encounter (Signed)
Called and discussed with the patient. We'll stop Lantus and start NPH 6 units 30 minutes before the start of the tube feeds (3:30 PM) and 6 more units at bedtime. I advised him that he can go up on the doses by 2 units during the weekend if the sugars stay high. They were advised to call with blood sugars on Monday or Tuesday.

## 2016-01-16 NOTE — Telephone Encounter (Signed)
Pt's daughter returned call. Pt is not on Metformin. She advised that pt is off his feedings from 8 am to 4 pm. His b/s highs are in the AM (mostly) 229-279 (feeding times during the evening and night). Before lunch b/s readings have been 147, 175, 159 >2 hrs after lunch 138-140 and at bedtime 146-205. Pt has already increased insulin to 10 units. Please advise.

## 2016-01-16 NOTE — Telephone Encounter (Signed)
Returned daughter's call. Lvm advising her (again) of the changes to the pt's lantus dose. Also asked her to call back and let us know if he is still on metformin.

## 2016-01-24 ENCOUNTER — Telehealth: Payer: Self-pay | Admitting: Internal Medicine

## 2016-01-24 ENCOUNTER — Other Ambulatory Visit: Payer: Self-pay | Admitting: *Deleted

## 2016-01-24 MED ORDER — INSULIN NPH (HUMAN) (ISOPHANE) 100 UNIT/ML ~~LOC~~ SUSP: SUBCUTANEOUS | Status: DC

## 2016-01-24 NOTE — Telephone Encounter (Signed)
Returned daughter's [name]) call and lvm advising her to return my call.

## 2016-01-24 NOTE — Telephone Encounter (Signed)
Patient wife returning your call call [phone]

## 2016-01-24 NOTE — Telephone Encounter (Signed)
Returned daughter's call. She asked for the rx NPH to be sent to [facility]. [facility] could not get it in. Done.

## 2016-01-24 NOTE — Telephone Encounter (Signed)
Patient wife has one more question to ask could you please before you leave.

## 2016-01-24 NOTE — Telephone Encounter (Signed)
PT daughter returning your call, [phone]

## 2016-01-24 NOTE — Telephone Encounter (Signed)
Patient daughter have question about dad medications, please call

## 2016-01-24 NOTE — Telephone Encounter (Signed)
Rx sent to [facility] per daughters request. [facility] couldn't get it in.

## 2016-01-27 NOTE — Telephone Encounter (Signed)
Returned pt's daughters call and answered question on Friday.

## 2016-01-28 ENCOUNTER — Telehealth: Payer: Self-pay | Admitting: Internal Medicine

## 2016-01-28 NOTE — Telephone Encounter (Signed)
Called pt's daughter and advised her per Dr Charlean Sanfilippo message. She voiced understanding.

## 2016-01-28 NOTE — Telephone Encounter (Signed)
Please read message below and advise.  

## 2016-01-28 NOTE — Telephone Encounter (Signed)
Humulin n just started on Saturday the BS are still running in low 200s when he wakes up please advise

## 2016-01-28 NOTE — Telephone Encounter (Signed)
Please increase the NPH at bedtime to 8 units and may even need to do 10 units if this is not enough. We will discuss further on Thursday when he is coming for his appointment.

## 2016-01-30 ENCOUNTER — Telehealth: Payer: Self-pay | Admitting: Cardiovascular Disease

## 2016-01-30 ENCOUNTER — Encounter: Payer: Self-pay | Admitting: *Deleted

## 2016-01-30 ENCOUNTER — Encounter: Payer: Self-pay | Admitting: Internal Medicine

## 2016-01-30 ENCOUNTER — Ambulatory Visit (INDEPENDENT_AMBULATORY_CARE_PROVIDER_SITE_OTHER): Payer: Medicare Other | Admitting: Internal Medicine

## 2016-01-30 VITALS — BP 112/64 | HR 66 | Temp 97.5°F | Resp 14 | Wt 144.0 lb

## 2016-01-30 DIAGNOSIS — E1159 Type 2 diabetes mellitus with other circulatory complications: Secondary | ICD-10-CM | POA: Insufficient documentation

## 2016-01-30 DIAGNOSIS — E1165 Type 2 diabetes mellitus with hyperglycemia: Secondary | ICD-10-CM

## 2016-01-30 MED ORDER — INSULIN PEN NEEDLE 32G X 4 MM MISC: Status: DC

## 2016-01-30 MED ORDER — INSULIN NPH (HUMAN) (ISOPHANE) 100 UNIT/ML ~~LOC~~ SUSP: SUBCUTANEOUS | Status: DC

## 2016-01-30 NOTE — Progress Notes (Signed)
Patient ID: [name] [name], male   DOB: [birth year], 75 y.o.   MRN: [id number]  HPI: [name] [name] is a 75 y.o.-year-old male, initially referred by his gastroenterologist, Dr Leone Payor, returning for follow-up for DM2, dx in ~2006, insulin-dependent, uncontrolled, with complications (CAD, s/p AMI, s/p CABG; CKD). Last visit 1.5 mo ago.   He had complications from his pancreatitis >> admitted >> then in rehab for 2 weeks >> G tube. Now on TFs from 4 pm-8 am (16 hours). He also eats small meals.  He has a stage 4 buttock ulcer.   Reviewed hx: Patient was admitted in 01/2015 with acute biliary pancreatitis secondary to gallstones and he had a very complicated hospitalization. He had an MI and had to have 3 vessel CABG during that hospitalization. He also developed esophagitis with GI bleeding. He is now slowly improving and continues to see Dr. Leone Payor for his pancreatitis. On the latest CT scan, there was a peripancreatic fluid collection, ? Necrosis. He has diarrhea >> started Creon >> a little better.   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  He had cholecystectomy in 07/2015 and had 3 ERCPs and had the IVC filter extracted.  Last hemoglobin A1c was: Lab Results  Component Value Date   HGBA1C 5.6 12/12/2015   HGBA1C 6.3 09/12/2015   HGBA1C 6.9 05/01/2015   Pt was on a regimen of: - Metformin 500 mg 2x a day, with meals (losing weight on a higher dose) - Lantus 8 >> 10 units in am - Humalog SSI units 3x a day, before meals: 201-250: + 2 units 251-300: + 3 units > 300: + 4 units He was on Glipizide before the pancreatitis. He was on Januvia 6 mo ago.  Now, since after last visit >> on tube feeds (continuous - Vital 1.5) - from 6 pm to 8  am: - NPH 6 units at 3:30-4 pm and  6 >> 8 units at bedtime  Pt checks his sugars 3x a day and they are: - 6 am: 200-210 >> 154-266 - after lunch:  111-167 - 4 pm (before feeds): 79-177 - 9 pm (bedtime): 94, 192-216  Previously: - am: 108, 122-170, 180, 220 >> 90-140, 160, 190  - 2h after b'fast: n/c - before lunch: 187-271 >> 186-210 - 2h after lunch: n/c - before dinner: 113-236 >> 147-201 - 2h after dinner: n/c - bedtime: n/c - nighttime: n/c No lows. Lowest sugar was 70 (on Glipizide) >> 80s >> 90 >> 79; he has hypoglycemia awareness at 70.  Highest sugar was 270s >> 271 >> 290 >> 266.  Before tube feeds, Pt's meals were: - Breakfast: cereal + strawberries (Special K) + oatmeal; toast + eggs - Lunch: potatoes, casseroles, chicken - Dinner: sandwich or soup, used to eat out - Snacks: yoghurt, cheese; no snacks at night  - + CKD, last BUN/creatinine:  Lab Results  Component Value Date   BUN 15 08/26/2015   CREATININE 0.93 08/26/2015  He is on lisinopril. - last set of lipids: Lab Results  Component Value Date   CHOL 104 01/15/2015   HDL 26* 01/15/2015   LDLCALC 64 01/15/2015   TRIG 195* 01/19/2015   CHOLHDL 4.0 01/15/2015  He is on Lipitor. - last eye exam was in  04/2015. No DR.  - no numbness and tingling in his feet.  ROS: Constitutional: + weight gain, + fatigue, no subjective hyperthermia/hypothermia Eyes: no blurry vision, no xerophthalmia ENT: no sore throat, no nodules palpated in throat, no dysphagia/odynophagia, no hoarseness Cardiovascular: no CP/SOB/palpitations/+ leg swelling Respiratory: no cough/SOB Gastrointestinal: + N/no V/+ D/C Musculoskeletal: no muscle/joint aches Skin: no rashes, + buttocks ulcer Neurological: no tremors/numbness/tingling/dizziness  I reviewed pt's medications, allergies, PMH, social hx, family hx, and changes were documented in the history of present illness. Otherwise, unchanged from my initial visit note.  Past  Medical History  Diagnosis Date  . Hypertension   . Diabetes mellitus type 2 in obese (HCC)   . Cholelithiasis   . DVT (deep venous thrombosis) (HCC)   . Cataract   . GERD (gastroesophageal reflux disease)   . MI (myocardial infarction) (HCC) 01/2015  . Pancreatitis 01/2015   Past Surgical History  Procedure Laterality Date  . Esophagogastroduodenoscopy N/A 01/21/2015    Procedure: ESOPHAGOGASTRODUODENOSCOPY (EGD);  Surgeon: Hilarie Fredrickson, MD;  Location: [facility] ENDOSCOPY;  Service: Endoscopy;  Laterality: N/A;  to be done at [facility]EDSIDE  . Cardiac catheterization N/A 01/17/2015    Procedure: Left Heart Cath and Coronary Angiography;  Surgeon: Lyn Records, MD;  Location: [facility] INVASIVE CV LAB;  Service: Cardiovascular;  Laterality: N/A;  . Esophagogastroduodenoscopy N/A 02/05/2015    Procedure: ESOPHAGOGASTRODUODENOSCOPY (EGD);  Surgeon: Hart Carwin, MD;  Location: [facility] ENDOSCOPY;  Service: Endoscopy;  Laterality: N/A;  . Coronary artery bypass graft N/A 02/14/2015    Procedure: CORONARY ARTERY BYPASS GRAFTING (CABG) x three, using left internal mammary artery and left leg greater saphenous vein harvested endoscopically;  Surgeon: Kerin Perna, MD;  Location: [facility] OR;  Service: Open Heart Surgery;  Laterality: N/A;  . Tee without cardioversion N/A 02/14/2015    Procedure: TRANSESOPHAGEAL ECHOCARDIOGRAM (TEE);  Surgeon: Kerin Perna, MD;  Location: [facility] OR;  Service: Open Heart Surgery;  Laterality: N/A;   Social History   Social History  . Marital Status: Married    Spouse Name: N/A  . Number of Children: N/A   Social History Main Topics  . Smoking status: Never Smoker   . Smokeless tobacco: Never Used  . Alcohol Use: No  . Drug Use: No   Current Outpatient Prescriptions on File Prior to Visit  Medication Sig Dispense Refill  . aspirin EC 81 MG EC tablet Take 1 tablet (81 mg total) by mouth daily.    Marland Kitchen atorvastatin (LIPITOR) 40 MG tablet TAKE ONE TABLET BY MOUTH ONCE DAILY IN THE EVENING AT   6PM 30 tablet 6  . Cholecalciferol (VITAMIN D-3) 1000 units CAPS Take by mouth daily.    . Folic Acid-Vit B6-Vit B12 (FOLBEE) 2.5-25-1 MG TABS tablet Take 1 tablet by mouth daily.    Marland Kitchen glucose blood (ONETOUCH VERIO) test strip Use to test blood sugar 2 times daily as instructed. Dx: E11.59 200 each 3  . insulin lispro (HUMALOG KWIKPEN) 100 UNIT/ML KiwkPen Inject 0.02-0.1 mLs (2-10 Units total) into the skin 3 (three) times daily as needed (increased BS). Per sliding scale for blood sugar > 200 units 15 mL 2  . insulin NPH Human (HUMULIN N) 100 UNIT/ML injection Inject 6 units 30 minutes before you start the tube feeds and another 6 units at bedtime. PENS, PLEASE!! 15 mL 2  . latanoprost (XALATAN) 0.005 % ophthalmic solution Place 1 drop into both eyes at bedtime.    . lipase/protease/amylase (CREON) 36000 UNITS CPEP  capsule Take 1 capsule (36,000 Units total) by mouth 3 (three) times daily before meals. And with snacks (Patient taking differently: Take 24,000 Units by mouth 3 (three) times daily before meals. And with snacks) 150 capsule 3  . metoprolol succinate (TOPROL XL) 25 MG 24 hr tablet Take 0.5 tablets (12.5 mg total) by mouth 2 (two) times daily. 30 tablet 6  . ondansetron (ZOFRAN) 4 MG tablet Take 4 mg by mouth every 8 (eight) hours as needed for nausea or vomiting.    Letta Pate DELICA LANCETS 33G MISC Use to test blood sugar 2 times daily. Dx: E11.59 200 each 3  . pantoprazole (PROTONIX) 40 MG tablet Take 1 tablet (40 mg total) by mouth daily before breakfast. 30 tablet 11  . tamsulosin (FLOMAX) 0.4 MG CAPS capsule Take 0.4 mg by mouth daily.    . vitamin C (ASCORBIC ACID) 500 MG tablet Take 500 mg by mouth daily.    . Zinc 50 MG CAPS Take by mouth.     No current facility-administered medications on file prior to visit.   No Known Allergies Family History  Problem Relation Age of Onset  . Heart attack Brother 55  . Pancreatic cancer Sister 4    died in her 34s.    PE: BP  112/64 mmHg  Pulse 66  Temp(Src) 97.5 F (36.4 C) (Oral)  Resp 14  Wt 144 lb (65.318 kg)  SpO2 99% Body mass index is 20.09 kg/(m^2).  Wt Readings from Last 3 Encounters:  01/30/16 144 lb (65.318 kg)  12/12/15 141 lb (63.957 kg)  12/11/15 141 lb (63.957 kg)   Constitutional: thin, in NAD Eyes: PERRLA, EOMI, no exophthalmos ENT: moist mucous membranes, no thyromegaly, no cervical lymphadenopathy Cardiovascular: RRR, No MRG, + LE pitting edema B, R>L Respiratory: CTA B Gastrointestinal: abdomen soft, NT, ND, BS+ Musculoskeletal: no deformities, strength intact in all 4 Skin: moist, warm, no rashes Neurological: no tremor with outstretched hands, DTR normal in all 4  ASSESSMENT: 1. DM2, insulin-dependent, uncontrolled, with complications - CAD, s/p AMI, s/p 3v CABG - CKD  PLAN:  1. Patient with insulin-dependent diabetes, likely due to pancreatitis. Since last visit, his diabetes worsened in the sense of having more blood sugars in the 200s after starting to feeds. He and his family kept in touch since last visit, and we started NPH insulin 5 days ago. I advised him to use 6 units of NPH at the beginning of 2 feeds at 4 PM and 6 more units at bedtime. After he started this regimen, sugars are slowly coming down. They contacted me 2 days ago about sugars still being high and we increased the NPH dose at that time to 8. He only has 2x AM CBGs since then, with the one this morning being 154, which is definitely better. For now, I advised him to continue the same NPH dosing and only use Humalog as needed for sugars in the 200s, but I advised him how to increase the dose by 2 units depending on the time of the day when his sugars are higher. This was discussed with the patient, his daughter, and his aide. They all voiced understanding of how to change the regimen by themselves, but were strongly advised to contact me if they have any question. - In 1 month, he will be reevaluated to see if he  can stop the tube feeds. I advised him that we will need to change his diabetes regimen at that time. I believe that  he will need to be on mealtime insulin at that point. - We reviewed together his last HbA1c, which was 5.6% a month and a half ago and will recheck a new one at next visit  - He also has a sacral decubitus ulcer for which she is receiving antibiotics. I believe that this could also increase his sugars. - I suggested to:  Patient Instructions  Please continue: - NPH 6 units before TF start and 8 units at bedtime. Increase the dose by 2 units for each of the above if needed.  Use Humalog Sliding scale as needed for sugars >250.  Please return in 1.5 months with your sugar log.   - continue checking sugars at different times of the day: A.m., bedtime, and if possible, before starting the tube feeds - Return to clinic in 1.5 mo with sugar log

## 2016-01-30 NOTE — Patient Instructions (Addendum)
Please continue: - NPH 6 units before TF start and 8 units at bedtime. Increase the dose by 2 units for each of the above if needed.  Use Humalog Sliding scale as needed for sugars >250.  Please return in 1.5 months with your sugar log.

## 2016-01-30 NOTE — Telephone Encounter (Signed)
Patient's family called requesting appointment.

## 2016-01-31 ENCOUNTER — Encounter: Payer: Self-pay | Admitting: Cardiovascular Disease

## 2016-01-31 ENCOUNTER — Ambulatory Visit (INDEPENDENT_AMBULATORY_CARE_PROVIDER_SITE_OTHER): Payer: Medicare Other | Admitting: Cardiovascular Disease

## 2016-01-31 VITALS — BP 115/72 | HR 61 | Ht 71.0 in | Wt 145.0 lb

## 2016-01-31 DIAGNOSIS — R600 Localized edema: Secondary | ICD-10-CM | POA: Diagnosis not present

## 2016-01-31 DIAGNOSIS — I82409 Acute embolism and thrombosis of unspecified deep veins of unspecified lower extremity: Secondary | ICD-10-CM

## 2016-01-31 DIAGNOSIS — R609 Edema, unspecified: Secondary | ICD-10-CM | POA: Diagnosis not present

## 2016-01-31 DIAGNOSIS — I10 Essential (primary) hypertension: Secondary | ICD-10-CM | POA: Diagnosis not present

## 2016-01-31 DIAGNOSIS — I6523 Occlusion and stenosis of bilateral carotid arteries: Secondary | ICD-10-CM

## 2016-01-31 DIAGNOSIS — I5023 Acute on chronic systolic (congestive) heart failure: Secondary | ICD-10-CM

## 2016-01-31 DIAGNOSIS — E785 Hyperlipidemia, unspecified: Secondary | ICD-10-CM | POA: Diagnosis not present

## 2016-01-31 DIAGNOSIS — I252 Old myocardial infarction: Secondary | ICD-10-CM

## 2016-01-31 DIAGNOSIS — I25810 Atherosclerosis of coronary artery bypass graft(s) without angina pectoris: Secondary | ICD-10-CM

## 2016-01-31 DIAGNOSIS — Z7901 Long term (current) use of anticoagulants: Secondary | ICD-10-CM

## 2016-01-31 MED ORDER — FUROSEMIDE 40 MG PO TABS: 40.0000 mg | ORAL_TABLET | Freq: Every day | ORAL | Status: DC

## 2016-01-31 NOTE — Progress Notes (Signed)
Patient ID: [name] [name], male   DOB: [birth year], 75 y.o.   MRN: [id number]      SUBJECTIVE: The patient presents prematurely for cardiovascular follow-up. I evaluated him on 12/11/15 and he was stable.  He has a history of CABG, non-STEMI, and chronic systolic heart failure, as well as DVT. Carotid Dopplers on 09/04/15 showed 40-59% right internal carotid artery and 60-79% left internal carotid artery stenosis. He underwent IVC filter retrieval in December 2016. No DVT by lower extremity venous ultrasound on 08/26/15. Echocardiogram in 08/2015 demonstrated mildly reduced left ventricular systolic function, EF 45%, which was an improvement from his prior echo.  Previously hospitalized for pancreatitis.  Wt 145 lbs (141 lbs 12/11/15).  He was hospitalized at [facility] for a Mallory-Weiss tear with a hemoglobin of 4 as per his daughter. Since that time he has gradually developed leg swelling. He denies chest pain and shortness of breath.   Review of Systems: As per "subjective", otherwise negative.  No Known Allergies  Current Outpatient Prescriptions  Medication Sig Dispense Refill  . aspirin EC 81 MG EC tablet Take 1 tablet (81 mg total) by mouth daily.    Marland Kitchen atorvastatin (LIPITOR) 40 MG tablet TAKE ONE TABLET BY MOUTH ONCE DAILY IN THE EVENING AT  6PM 30 tablet 6  . Cholecalciferol (VITAMIN D-3) 1000 units CAPS Take by mouth daily.    . cholestyramine light (PREVALITE) 4 g packet Take 4 g by mouth 2 (two) times daily.    . ciprofloxacin (CIPRO) 500 MG/5ML (10%) suspension Take 500 mg by mouth 2 (two) times daily.     . clindamycin (CLEOCIN) 150 MG capsule Take 300 mg by mouth 4 (four) times daily.  1  . Folic Acid-Vit B6-Vit B12 (FOLBEE) 2.5-25-1 MG TABS tablet Take 1 tablet by mouth daily.    Marland Kitchen glucose blood (ONETOUCH VERIO) test strip Use to test blood sugar 2 times daily as instructed. Dx: E11.59 200 each 3  . insulin lispro (HUMALOG KWIKPEN) 100 UNIT/ML KiwkPen Inject 0.02-0.1 mLs  (2-10 Units total) into the skin 3 (three) times daily as needed (increased BS). Per sliding scale for blood sugar > 200 units 15 mL 2  . insulin NPH Human (HUMULIN N) 100 UNIT/ML injection Inject 6 units 30 minutes before you start the tube feeds and another 8 units at bedtime. PENS, PLEASE!! 15 mL 2  . Insulin Pen Needle (CAREFINE PEN NEEDLES) 32G X 4 MM MISC Use 2x a day 100 each 11  . latanoprost (XALATAN) 0.005 % ophthalmic solution Place 1 drop into both eyes at bedtime.    . lipase/protease/amylase (CREON) 36000 UNITS CPEP capsule Take 1 capsule (36,000 Units total) by mouth 3 (three) times daily before meals. And with snacks (Patient taking differently: Take 24,000 Units by mouth 3 (three) times daily before meals. And with snacks) 150 capsule 3  . metoprolol tartrate (LOPRESSOR) 25 MG tablet 12.5 mg by Per J Tube route 2 (two) times daily.    . Multiple Vitamins-Minerals (CENTRUM SILVER PO) 5 mLs by Per J Tube route daily.    Marland Kitchen omeprazole (PRILOSEC) 40 MG capsule 20 mg by Per J Tube route daily.     . ondansetron (ZOFRAN) 4 MG tablet Take 4 mg by mouth every 8 (eight) hours as needed for nausea or vomiting.    Letta Pate DELICA LANCETS 33G MISC Use to test blood sugar 2 times daily. Dx: E11.59 200 each 3  . thiamine (VITAMIN B-1) 50 MG tablet 25 mg by  Gastric Tube route.     . vitamin C (ASCORBIC ACID) 500 MG tablet 500 mg by Per J Tube route daily.     . Zinc 50 MG CAPS Take by mouth.     No current facility-administered medications for this visit.    Past Medical History  Diagnosis Date  . Hypertension   . Diabetes mellitus type 2 in obese (HCC)   . Cholelithiasis   . DVT (deep venous thrombosis) (HCC)   . Cataract   . GERD (gastroesophageal reflux disease)   . MI (myocardial infarction) (HCC) 01/2015  . Pancreatitis 01/2015    Past Surgical History  Procedure Laterality Date  . Esophagogastroduodenoscopy N/A 01/21/2015    Procedure: ESOPHAGOGASTRODUODENOSCOPY (EGD);  Surgeon:  Hilarie Fredrickson, MD;  Location: [facility] ENDOSCOPY;  Service: Endoscopy;  Laterality: N/A;  to be done at BEDSIDE  . Cardiac catheterization N/A 01/17/2015    Procedure: Left Heart Cath and Coronary Angiography;  Surgeon: Lyn Records, MD;  Location: [facility] INVASIVE CV LAB;  Service: Cardiovascular;  Laterality: N/A;  . Esophagogastroduodenoscopy N/A 02/05/2015    Procedure: ESOPHAGOGASTRODUODENOSCOPY (EGD);  Surgeon: Hart Carwin, MD;  Location: [facility] ENDOSCOPY;  Service: Endoscopy;  Laterality: N/A;  . Coronary artery bypass graft N/A 02/14/2015    Procedure: CORONARY ARTERY BYPASS GRAFTING (CABG) x three, using left internal mammary artery and left leg greater saphenous vein harvested endoscopically;  Surgeon: Kerin Perna, MD;  Location: [facility] OR;  Service: Open Heart Surgery;  Laterality: N/A;  . Tee without cardioversion N/A 02/14/2015    Procedure: TRANSESOPHAGEAL ECHOCARDIOGRAM (TEE);  Surgeon: Kerin Perna, MD;  Location: [facility] OR;  Service: Open Heart Surgery;  Laterality: N/A;    Social History   Social History  . Marital Status: Married    Spouse Name: N/A  . Number of Children: N/A  . Years of Education: N/A   Occupational History  . Not on file.   Social History Main Topics  . Smoking status: Never Smoker   . Smokeless tobacco: Never Used  . Alcohol Use: No  . Drug Use: No  . Sexual Activity: Yes   Other Topics Concern  . Not on file   Social History Narrative     Filed Vitals:   01/31/16 0853  BP: 115/72  Pulse: 61  Height: 5\' 11"  (1.803 m)  Weight: 145 lb (65.772 kg)    PHYSICAL EXAM General: NAD HEENT: Normal. Neck: No JVD, no thyromegaly. Lungs: Clear to auscultation bilaterally with normal respiratory effort. CV: Nondisplaced PMI.  Regular rate and rhythm, normal S1/S2, no S3/S4, no murmur.2+  pitting pretibial edema.     Abdomen: Soft, no distention.  Neurologic: Alert and oriented.  Psych: Normal affect.     ECG: Most recent ECG  reviewed.      ASSESSMENT AND PLAN: 1. CAD with 3-vessel CABG and non-STEMI: Symptomatically stable. Will continue aspirin, Lipitor, and metoprolol succinate. No longer on ACEI.  2. Acute on chronic systolic heart failure/ischemic cardiomyopathy, EF 45%/bilateral leg edema: Had been on Lasix 40 mg daily but daughter preferred at that time he not be. Will start Lasix 40 mg daily x 3 days then reduce to 20 mg daily as maintenance. If after 3 days leg swelling has not gone down, instructed to continue 40 mg daily until it does.  Will check BMET 5/30. Continue metoprolol succinate. No longer on lisinopril.  3. DVT: On warfarin. IVC filter removed in 08/2015. I feel both Lovenox and warfarin can safely be  stopped.  4. Hyperlipidemia: On Lipitor 40 mg. Will check lipids.  5. Essential HTN: Controlled. No changes.  6. Bilateral carotid artery stenosis: Repeat in July 2017. Continue ASA and statin.  Dispo: f/u 6 weeks.   Prentice Docker, M.D., F.A.C.C.

## 2016-01-31 NOTE — Patient Instructions (Addendum)
   Begin Lasix 40mg  daily x 3 days, may decrease to 1/2 tab (20mg ) as needed - new sent to [facility] today.  Continue all other medications.   Labs for BMET, FLP - orders given today - Reminder:  Nothing to eat or drink after 12 midnight prior to labs.  (Can do on Tuesday.) Office will contact with results via phone or letter.   Follow up in  6 weeks.

## 2016-02-05 ENCOUNTER — Telehealth: Payer: Self-pay | Admitting: Cardiovascular Disease

## 2016-02-05 NOTE — Telephone Encounter (Signed)
Yes, please increase to 40 mg bid x 3 days and check BMET on day 3.

## 2016-02-05 NOTE — Telephone Encounter (Signed)
Notified daughter Thera Flake).  Can do BMET on Monday as 3 days will fall on the weekend.  Will check with Bonita Quin with [facility] to see if she can go out to house & draw for patient.

## 2016-02-05 NOTE — Telephone Encounter (Signed)
[name] ( [facility]) is seeing Mr. [name] [name] weight continues to go up. Sunday 142.2 Monday 143.8 Tuesday 146.2 Wednesday 147.8  Home Health is wanting to know if his Lasix 40 mg can be increased.  States lungs are clear no shortness of breath.  Please call # [phone]

## 2016-02-06 NOTE — Telephone Encounter (Signed)
Left message on voice mail with [name] regarding information below.  Asked for a return call to see if they can go out to do lab.

## 2016-02-06 NOTE — Telephone Encounter (Signed)
Received call back from [place] with [facility].  They can go out to draw lab as they are already scheduled to go this Friday.    Joaquin Courts (daughter) notified.  She thinks they may have a nutrition appt at [facility] this day.  If unable to work out with Bonita Quin, they will call us back for lab order if need to go to outside facility to have done.

## 2016-02-11 ENCOUNTER — Telehealth: Payer: Self-pay | Admitting: *Deleted

## 2016-02-11 NOTE — Telephone Encounter (Signed)
Lab draw

## 2016-02-11 NOTE — Telephone Encounter (Signed)
[facility] Amy called and states that she went out on yesterday to draw labs she was unsuccessful after 3 sticks. She would like to know if you would like to wait til next week or have the patient's labs drawn in a lab? Please advise.

## 2016-02-11 NOTE — Telephone Encounter (Addendum)
Spoke with daughter [place]) - stated Bonita Quin came by & was successful at getting blood yesterday.  Will await test results.   Also states fluid is about the same.

## 2016-02-17 ENCOUNTER — Encounter: Payer: Self-pay | Admitting: *Deleted

## 2016-03-02 ENCOUNTER — Telehealth: Payer: Self-pay | Admitting: Internal Medicine

## 2016-03-02 NOTE — Telephone Encounter (Signed)
Patient's daughter would like to talk to CMA about the patient's tube feeding.  It has gone down from 16hrs a days to 8hrs a day.  Please call. [phone]

## 2016-03-02 NOTE — Telephone Encounter (Signed)
I contacted the pt's daughter, she stated the pt's tube feeding has been cut down from 16 hrs per day to 8 hrs per day. Daughter wanted to verify the dosage of the humulin n with the new tube feeding instructions. She stated the pt has not needed the humalog during the day because his sugar has been normal.   Please advise, Thanks!

## 2016-03-02 NOTE — Telephone Encounter (Signed)
Aundra Millet, can you please find out how his sugars are running now and which 8h they are doing - between what hours.

## 2016-03-03 NOTE — Telephone Encounter (Signed)
Let's continue NPH 8 units at bedtime but move the 6 units at 3-4 pm to morning.

## 2016-03-03 NOTE — Telephone Encounter (Signed)
I contacted the pt and advised pt's daughter of instructions below via voicemail. Requested a call back from the pt's daughter.

## 2016-03-03 NOTE — Telephone Encounter (Signed)
I contacted the pt's daughter. She stated she was on the way to the wound center and could not advise me on a lot of blood sugar readings. She did state last night at bed time (10 pm) blood sugar was 113 and fasting this morning was 126. She stated the pt started his tube feeding at 950 last night and ran for 8hrs.

## 2016-03-13 ENCOUNTER — Encounter: Payer: Self-pay | Admitting: Cardiovascular Disease

## 2016-03-19 ENCOUNTER — Ambulatory Visit: Payer: Medicare Other

## 2016-03-19 DIAGNOSIS — I6523 Occlusion and stenosis of bilateral carotid arteries: Secondary | ICD-10-CM

## 2016-03-19 LAB — VAS US CAROTID
LCCADDIAS: -30 cm/s
LCCADSYS: -111 cm/s
LEFT ECA DIAS: -29 cm/s
LEFT VERTEBRAL DIAS: -20 cm/s
LICADDIAS: -68 cm/s
LICADSYS: -252 cm/s
LICAPSYS: 232 cm/s
Left CCA prox dias: 22 cm/s
Left CCA prox sys: 97 cm/s
Left ICA prox dias: 77 cm/s
RCCAPSYS: 98 cm/s
RIGHT ECA DIAS: -15 cm/s
RIGHT VERTEBRAL DIAS: -19 cm/s
Right CCA prox dias: 30 cm/s
Right cca dist sys: -139 cm/s

## 2016-03-23 ENCOUNTER — Encounter: Payer: Self-pay | Admitting: Cardiovascular Disease

## 2016-03-23 ENCOUNTER — Ambulatory Visit (INDEPENDENT_AMBULATORY_CARE_PROVIDER_SITE_OTHER): Payer: Medicare Other | Admitting: Cardiovascular Disease

## 2016-03-23 VITALS — BP 132/78 | HR 70 | Ht 71.0 in | Wt 141.0 lb

## 2016-03-23 DIAGNOSIS — I6523 Occlusion and stenosis of bilateral carotid arteries: Secondary | ICD-10-CM

## 2016-03-23 DIAGNOSIS — E785 Hyperlipidemia, unspecified: Secondary | ICD-10-CM

## 2016-03-23 DIAGNOSIS — I5023 Acute on chronic systolic (congestive) heart failure: Secondary | ICD-10-CM | POA: Diagnosis not present

## 2016-03-23 DIAGNOSIS — I10 Essential (primary) hypertension: Secondary | ICD-10-CM

## 2016-03-23 DIAGNOSIS — I252 Old myocardial infarction: Secondary | ICD-10-CM

## 2016-03-23 DIAGNOSIS — R600 Localized edema: Secondary | ICD-10-CM

## 2016-03-23 DIAGNOSIS — I82409 Acute embolism and thrombosis of unspecified deep veins of unspecified lower extremity: Secondary | ICD-10-CM

## 2016-03-23 DIAGNOSIS — I25810 Atherosclerosis of coronary artery bypass graft(s) without angina pectoris: Secondary | ICD-10-CM

## 2016-03-23 MED ORDER — ATORVASTATIN CALCIUM 40 MG PO TABS: 40.0000 mg | ORAL_TABLET | Freq: Every day | ORAL | Status: DC

## 2016-03-23 MED ORDER — FUROSEMIDE 40 MG PO TABS: ORAL_TABLET | ORAL | Status: DC

## 2016-03-23 MED ORDER — METOPROLOL SUCCINATE ER 25 MG PO TB24: 25.0000 mg | ORAL_TABLET | Freq: Every day | ORAL | Status: DC

## 2016-03-23 NOTE — Progress Notes (Signed)
Patient ID: [name] [name], male   DOB: [birth year], 75 y.o.   MRN: [id number]      SUBJECTIVE: The patient presents for follow-up of acute on chronic systolic heart failure. Weight back down to 141 pounds from 145 pounds on 5/26.  He is feeling better but still has some residual leg swelling. He denies chest pain and shortness of breath. Labs 6/5 BUN 24, creatinine 0.79, sodium 135.  Carotid Dopplers 03/19/16 40-59% right internal carotid artery and 60-79% left internal carotid artery stenosis.  He is here with his wife and his daughter, [name]. She has several questions regarding medications.  He has a history of CABG, non-STEMI, and chronic systolic heart failure, as well as DVT.  Review of Systems: As per "subjective", otherwise negative.  No Known Allergies  Current Outpatient Prescriptions  Medication Sig Dispense Refill  . aspirin EC 81 MG EC tablet Take 1 tablet (81 mg total) by mouth daily.    Marland Kitchen atorvastatin (LIPITOR) 40 MG tablet TAKE ONE TABLET BY MOUTH ONCE DAILY IN THE EVENING AT  6PM 30 tablet 6  . Cholecalciferol (VITAMIN D-3) 1000 units CAPS Take by mouth daily.    . cholestyramine light (PREVALITE) 4 g packet Take 4 g by mouth 2 (two) times daily.    . Folic Acid-Vit B6-Vit B12 (FOLBEE) 2.5-25-1 MG TABS tablet Take 1 tablet by mouth daily.    . furosemide (LASIX) 40 MG tablet Take 1 tablet (40 mg total) by mouth daily. ( may decrease to 1/2 tab ( ) as needed ) 30 tablet 6  . glucose blood (ONETOUCH VERIO) test strip Use to test blood sugar 2 times daily as instructed. Dx: E11.59 200 each 3  . insulin lispro (HUMALOG KWIKPEN) 100 UNIT/ML KiwkPen Inject 0.02-0.1 mLs (2-10 Units total) into the skin 3 (three) times daily as needed (increased BS). Per sliding scale for blood sugar > 200 units 15 mL 2  . insulin NPH Human (HUMULIN N) 100 UNIT/ML injection Inject 6 units 30 minutes before you start the tube feeds and another 8 units at bedtime. PENS, PLEASE!! 15 mL 2  .  Insulin Pen Needle (CAREFINE PEN NEEDLES) 32G X 4 MM MISC Use 2x a day 100 each 11  . latanoprost (XALATAN) 0.005 % ophthalmic solution Place 1 drop into both eyes at bedtime.    . lipase/protease/amylase (CREON) 36000 UNITS CPEP capsule Take 1 capsule (36,000 Units total) by mouth 3 (three) times daily before meals. And with snacks (Patient taking differently: Take 24,000 Units by mouth 3 (three) times daily before meals. And with snacks) 150 capsule 3  . metoprolol tartrate (LOPRESSOR) 25 MG tablet 12.5 mg by Per J Tube route 2 (two) times daily.    . Multiple Vitamins-Minerals (CENTRUM SILVER PO) 5 mLs by Per J Tube route daily.    Marland Kitchen omeprazole (PRILOSEC) 40 MG capsule 20 mg by Per J Tube route daily.     . ondansetron (ZOFRAN) 4 MG tablet Take 4 mg by mouth every 8 (eight) hours as needed for nausea or vomiting.    Letta Pate DELICA LANCETS 33G MISC Use to test blood sugar 2 times daily. Dx: E11.59 200 each 3  . thiamine (VITAMIN B-1) 50 MG tablet 25 mg by Gastric Tube route.     . vitamin C (ASCORBIC ACID) 500 MG tablet 500 mg by Per J Tube route daily.     . Zinc 50 MG CAPS Take by mouth.     No current facility-administered medications for  this visit.    Past Medical History  Diagnosis Date  . Hypertension   . Diabetes mellitus type 2 in obese (HCC)   . Cholelithiasis   . DVT (deep venous thrombosis) (HCC)   . Cataract   . GERD (gastroesophageal reflux disease)   . MI (myocardial infarction) (HCC) 01/2015  . Pancreatitis 01/2015    Past Surgical History  Procedure Laterality Date  . Esophagogastroduodenoscopy N/A 01/21/2015    Procedure: ESOPHAGOGASTRODUODENOSCOPY (EGD);  Surgeon: Hilarie Fredrickson, MD;  Location: [facility] ENDOSCOPY;  Service: Endoscopy;  Laterality: N/A;  to be done at BEDSIDE  . Cardiac catheterization N/A 01/17/2015    Procedure: Left Heart Cath and Coronary Angiography;  Surgeon: Lyn Records, MD;  Location: [facility] INVASIVE CV LAB;  Service: Cardiovascular;  Laterality: N/A;   . Esophagogastroduodenoscopy N/A 02/05/2015    Procedure: ESOPHAGOGASTRODUODENOSCOPY (EGD);  Surgeon: Hart Carwin, MD;  Location: [facility] ENDOSCOPY;  Service: Endoscopy;  Laterality: N/A;  . Coronary artery bypass graft N/A 02/14/2015    Procedure: CORONARY ARTERY BYPASS GRAFTING (CABG) x three, using left internal mammary artery and left leg greater saphenous vein harvested endoscopically;  Surgeon: Kerin Perna, MD;  Location: [facility] OR;  Service: Open Heart Surgery;  Laterality: N/A;  . Tee without cardioversion N/A 02/14/2015    Procedure: TRANSESOPHAGEAL ECHOCARDIOGRAM (TEE);  Surgeon: Kerin Perna, MD;  Location[facility] OR;  Service: Open Heart Surgery;  Laterality: N/A;    Social History   Social History  . Marital Status: Married    Spouse Name: N/A  . Number of Children: N/A  . Years of Education: N/A   Occupational History  . Not on file.   Social History Main Topics  . Smoking status: Never Smoker   . Smokeless tobacco: Never Used  . Alcohol Use: No  . Drug Use: No  . Sexual Activity: Yes   Other Topics Concern  . Not on file   Social History Narrative     Filed Vitals:   03/23/16 1117  BP: 132/78  Pulse: 70  Height: 5\' 11"  (1.803 m)  Weight: 141 lb (63.957 kg)  SpO2: 98%    PHYSICAL EXAM General: NAD HEENT: Normal. Neck: No JVD, no thyromegaly. Lungs: Clear to auscultation bilaterally with normal respiratory effort. CV: Nondisplaced PMI. Regular rate and rhythm, normal S1/S2, no S3/S4, no murmur. Trace pretibial edema.  Abdomen: Soft, no distention.  Neurologic: Alert and oriented.  Psych: Normal affect.  ECG: Most recent ECG reviewed.      ASSESSMENT AND PLAN: 1. CAD with 3-vessel CABG and non-STEMI: Symptomatically stable. Will continue aspirin, Lipitor, and metoprolol (will switch to succinate 25 mg daily). No longer on ACEI.  2. Acute on chronic systolic heart failure/ischemic cardiomyopathy, EF 45%/bilateral leg edema: Will continue Lasix  40 mg q am and add 20 mg q day at 4 pm.  3. DVT: On warfarin. IVC filter removed in 08/2015. No longer on anticoagulation.  4. Hyperlipidemia: On Lipitor 40 mg. Lipids reviewed with family today. No changes.  5. Essential HTN: Controlled. No changes.  6. Bilateral carotid artery stenosis: Most recent results reviewed above. Repeat in July 2018. Continue ASA and statin.  Dispo: f/u 3 months.   Prentice Docker, M.D., F.A.C.C.

## 2016-03-23 NOTE — Patient Instructions (Signed)
Medication Instructions:   Continue the Lasix 40mg  every morning & add 1/2 tab (20mg ) every evening at 4:00 pm.  Stop Lopressor (Metoprolol Tartrate).  Begin Toprol XL (Metoprolol Succinate) 25mg  daily.  Lipitor refilled.   All medications above sent to [facility] today. Continue all other medications.    Labwork: NONE  Testing/Procedures: NONE  Follow-Up: 3 months   Any Other Special Instructions Will Be Listed Below (If Applicable).  If you need a refill on your cardiac medications before your next appointment, please call your pharmacy.

## 2016-04-06 ENCOUNTER — Encounter: Payer: Self-pay | Admitting: Internal Medicine

## 2016-04-06 ENCOUNTER — Ambulatory Visit (INDEPENDENT_AMBULATORY_CARE_PROVIDER_SITE_OTHER): Payer: Medicare Other | Admitting: Internal Medicine

## 2016-04-06 VITALS — BP 108/62 | HR 65 | Ht 71.0 in | Wt 137.0 lb

## 2016-04-06 DIAGNOSIS — E1165 Type 2 diabetes mellitus with hyperglycemia: Secondary | ICD-10-CM

## 2016-04-06 DIAGNOSIS — E1159 Type 2 diabetes mellitus with other circulatory complications: Secondary | ICD-10-CM

## 2016-04-06 LAB — POCT GLYCOSYLATED HEMOGLOBIN (HGB A1C): Hemoglobin A1C: 5.9

## 2016-04-06 MED ORDER — INSULIN NPH (HUMAN) (ISOPHANE) 100 UNIT/ML ~~LOC~~ SUSP: SUBCUTANEOUS | 2 refills | Status: DC

## 2016-04-06 NOTE — Patient Instructions (Signed)
Please continue: - NPH 6 units in am (before b'fast) and 8 units at bedtime.  Please check some sugars mid-day.  Use Humalog Sliding scale as needed for sugars >250.  Please return in 3-4 months with your sugar log.

## 2016-04-06 NOTE — Addendum Note (Signed)
Addended by: Darene Lamer T on: 04/06/2016 10:43 AM   Modules accepted: Orders

## 2016-04-06 NOTE — Progress Notes (Signed)
Patient ID: VERLIN UHER, male   DOB: 1941/08/12, 75 y.o.   MRN: [id number]  HPI: AVRAM DANIELSON is a 75 y.o.-year-old male, initially referred by his gastroenterologist, Dr Leone Payor, returning for follow-up for DM2, dx in ~2006, insulin-dependent, uncontrolled, with complications (CAD, s/p AMI, s/p CABG; CKD). Last visit 1.5 mo ago.   Pt is now on TFs for 8 hours/day). He also eats small meals.  He had a stage 4 buttock ulcer >> healing.  Reviewed hx: Patient was admitted in 01/2015 with acute biliary pancreatitis secondary to gallstones and he had a very complicated hospitalization. He had an MI and had to have 3 vessel CABG during that hospitalization. He also developed esophagitis with GI bleeding. He is now slowly improving and continues to see Dr. Leone Payor for his pancreatitis. On the latest CT scan, there was a peripancreatic fluid collection, ? Necrosis. He has diarrhea >> started Creon >> a little better.   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  He had cholecystectomy in 07/2015 and had 3 ERCPs and had the IVC filter extracted.  Last hemoglobin A1c was: Lab Results  Component Value Date   HGBA1C 5.6 12/12/2015   HGBA1C 6.3 09/12/2015   HGBA1C 6.9 05/01/2015   Pt was on a regimen of: - Metformin 500 mg 2x a day, with meals (losing weight on a higher dose) - Lantus 8 >> 10 units in am - Humalog SSI units 3x a day, before meals: 201-250: + 2 units 251-300: + 3 units > 300: + 4 units He was on Glipizide before the pancreatitis. He was on Januvia 6 mo ago.  Now on tube feeds (continuous - Vital 1.5 - reduced from 16h a day to 8h a day since last visit: 10 pm to 6 am): - NPH 6 units in am and 8 units at bedtime  Pt checks his sugars  2x a day and they are: - 6 am: 200-210 >> 154-266 >> 82-175 (most 120s-150s) - before lunch: 94, 105 - after lunch:  111-167 >> n.c - 4 pm (before feeds): 79-177 >> before dinner: 85-172, 190 - 9 pm (bedtime): 94, 192- >> 74-194  Previously: - am: 108, 122-170, 180, 220 >> 90-140, 160, 190  - 2h after b'fast: n/c - before lunch: 187-271 >> 186-210 - 2h after lunch: n/c - before dinner: 113-236 >> 147-201 - 2h after dinner: n/c - bedtime: n/c - nighttime: n/c No lows. Lowest sugar was 70 (on Glipizide) >> 80s >> 90 >> 79; he has hypoglycemia awareness at 70.  Highest sugar was 270s >> 271 >> 290 >> 266.  Before tube feeds, Pt's meals were: - Breakfast: cereal + strawberries (Special K) + oatmeal; toast + eggs; scrambled eggs; cheese toast - Lunch: potatoes, casseroles, chicken - Dinner: sandwich or soup, used to eat out - Snacks: yoghurt, cheese; no snacks at night  - + CKD, last BUN/creatinine:  Lab Results  Component Value Date   BUN 15 08/26/2015   CREATININE 0.93 08/26/2015  He is on lisinopril. - last set of lipids: Lab Results  Component Value Date   CHOL 104 01/15/2015   HDL 26 (L) 01/15/2015   LDLCALC 64 01/15/2015   TRIG 195 (H) 01/19/2015   CHOLHDL 4.0 01/15/2015  He is  on Lipitor. - last eye exam was in 04/2015. No DR.  - no numbness and tingling in his feet.  ROS: Constitutional: + weight loss - 8 lbs since last visit , + fatigue, no subjective hyperthermia/hypothermia Eyes: no blurry vision, no xerophthalmia ENT: no sore throat, no nodules palpated in throat, no dysphagia/odynophagia, no hoarseness Cardiovascular: no CP/SOB/palpitations/leg swelling Respiratory: + cough  In last week/ no SOB Gastrointestinal: no N/V/D/C Musculoskeletal: no muscle/joint aches Skin: no rashes Neurological: no tremors/numbness/tingling/dizziness  I reviewed pt's medications, allergies, PMH, social hx, family hx, and changes were documented in the history of present  illness. Otherwise, unchanged from my initial visit note.  Past Medical History:  Diagnosis Date  . Cataract   . Cholelithiasis   . Diabetes mellitus type 2 in obese (HCC)   . DVT (deep venous thrombosis) (HCC)   . GERD (gastroesophageal reflux disease)   . Hypertension   . MI (myocardial infarction) (HCC) 01/2015  . Pancreatitis 01/2015   Past Surgical History:  Procedure Laterality Date  . CARDIAC CATHETERIZATION N/A 01/17/2015   Procedure: Left Heart Cath and Coronary Angiography;  Surgeon: Lyn Records, MD;  Location: [facility] INVASIVE CV LAB;  Service: Cardiovascular;  Laterality: N/A;  . CORONARY ARTERY BYPASS GRAFT N/A 02/14/2015   Procedure: CORONARY ARTERY BYPASS GRAFTING (CABG) x three, using left internal mammary artery and left leg greater saphenous vein harvested endoscopically;  Surgeon: Kerin Perna, MD;  Location: [facility] OR;  Service: Open Heart Surgery;  Laterality: N/A;  . ESOPHAGOGASTRODUODENOSCOPY N/A 01/21/2015   Procedure: ESOPHAGOGASTRODUODENOSCOPY (EGD);  Surgeon: Hilarie Fredrickson, MD;  Location: [facility] ENDOSCOPY;  Service: Endoscopy;  Laterality: N/A;  to be done at BEDSIDE  . ESOPHAGOGASTRODUODENOSCOPY N/A 02/05/2015   Procedure: ESOPHAGOGASTRODUODENOSCOPY (EGD);  Surgeon: Hart Carwin, MD;  Location: [facility] ENDOSCOPY;  Service: Endoscopy;  Laterality: N/A;  . TEE WITHOUT CARDIOVERSION N/A 02/14/2015   Procedure: TRANSESOPHAGEAL ECHOCARDIOGRAM (TEE);  Surgeon: Kerin Perna, MD;  Location: [facility] OR;  Service: Open Heart Surgery;  Laterality: N/A;   Social History   Social History  . Marital Status: Married    Spouse Name: N/A  . Number of Children: N/A   Social History Main Topics  . Smoking status: Never Smoker   . Smokeless tobacco: Never Used  . Alcohol Use: No  . Drug Use: No   Current Outpatient Prescriptions on File Prior to Visit  Medication Sig Dispense Refill  . aspirin EC 81 MG EC tablet Take 1 tablet (81 mg total) by mouth daily.    Marland Kitchen atorvastatin (LIPITOR) 40 MG  tablet Take 1 tablet (40 mg total) by mouth daily. 30 tablet 6  . Cholecalciferol (VITAMIN D-3) 1000 units CAPS Take by mouth daily.    . Folic Acid-Vit B6-Vit B12 (FOLBEE) 2.5-25-1 MG TABS tablet Take 1 tablet by mouth daily.    . furosemide (LASIX) 40 MG tablet Take one tab (40mg ) by mouth every morning & 1/2 tab (20mg ) every evening at 4:00 pm (Patient taking differently: Take one tab (40mg ) by mouth every morning & 1/2 tab (20mg ) every evening at 4:00 pm) 45 tablet 6  . glucose blood (ONETOUCH VERIO) test strip Use to test blood sugar 2 times daily as instructed. Dx: E11.59 200 each 3  . insulin lispro (HUMALOG KWIKPEN) 100 UNIT/ML KiwkPen Inject 0.02-0.1 mLs (2-10 Units total) into the skin 3 (three) times daily as needed (increased BS). Per sliding scale for blood sugar > 200 units 15 mL 2  . insulin NPH  Human (HUMULIN N) 100 UNIT/ML injection Inject 6 units 30 minutes before you start the tube feeds and another 8 units at bedtime. PENS, PLEASE!! 15 mL 2  . Insulin Pen Needle (CAREFINE PEN NEEDLES) 32G X 4 MM MISC Use 2x a day 100 each 11  . latanoprost (XALATAN) 0.005 % ophthalmic solution Place 1 drop into both eyes at bedtime.    . lipase/protease/amylase (CREON) 36000 UNITS CPEP capsule Take 1 capsule (36,000 Units total) by mouth 3 (three) times daily before meals. And with snacks (Patient taking differently: Take 24,000 Units by mouth 3 (three) times daily before meals. And with snacks) 150 capsule 3  . metoprolol succinate (TOPROL-XL) 25 MG 24 hr tablet Take 1 tablet (25 mg total) by mouth daily. 30 tablet 6  . Multiple Vitamins-Minerals (CENTRUM SILVER PO) 5 mLs by Per J Tube route daily.    Letta Pate DELICA LANCETS 33G MISC Use to test blood sugar 2 times daily. Dx: E11.59 200 each 3  . thiamine (VITAMIN B-1) 50 MG tablet 25 mg by Gastric Tube route.     . vitamin C (ASCORBIC ACID) 500 MG tablet 500 mg by Per J Tube route daily.     . Zinc 50 MG CAPS Take by mouth.    . cholestyramine  light (PREVALITE) 4 g packet Take 4 g by mouth 2 (two) times daily.    Marland Kitchen omeprazole (PRILOSEC) 40 MG capsule 20 mg by Per J Tube route daily.     . ondansetron (ZOFRAN) 4 MG tablet Take 4 mg by mouth every 8 (eight) hours as needed for nausea or vomiting.     No current facility-administered medications on file prior to visit.    No Known Allergies Family History  Problem Relation Age of Onset  . Heart attack Brother 55  . Pancreatic cancer Sister 58    died in her 47s.    PE: BP 108/62 (BP Location: Left Arm, Patient Position: Sitting)   Pulse 65   Ht  (1.803 m)   Wt 137 lb (62.1 kg)   SpO2 98%   BMI 19.11 kg/m  Body mass index is 19.11 kg/m.  Wt Readings from Last 3 Encounters:  04/06/16 137 lb (62.1 kg)  03/23/16 141 lb (64 kg)  01/31/16 145 lb (65.8 kg)   Constitutional: thin, in NAD Eyes: PERRLA, EOMI, no exophthalmos ENT: moist mucous membranes, no thyromegaly, no cervical lymphadenopathy Cardiovascular: RRR, No MRG, no LE pitting edema B, R>L Respiratory: + crackles B lung bases Gastrointestinal: abdomen soft, NT, ND, BS+ Musculoskeletal: no deformities, strength intact in all 4 Skin: moist, warm, no rashes Neurological: no tremor with outstretched hands, DTR normal in all 4  ASSESSMENT: 1. DM2, insulin-dependent, uncontrolled, with complications - CAD, s/p AMI, s/p 3v CABG - CKD  PLAN:  1. Patient with insulin-dependent diabetes, likely due to pancreatitis. He is on TF's, now reduced to 8h a night and he also eats small meals throughout the day. Sugars are at or close to goal most times, with few spikes.  He is not using his Humalog, which he has as needed for sugars >250. Will continue current regimen, but they will let me know if CBGs change or if TF schedule changes. - checked his HbA1c >> 5.9% (great!) - I suggested to:  Patient Instructions  Please continue: - NPH 6 units in am (before b'fast) and 8 units at bedtime.  Please check some sugars  mid-day.  Use Humalog Sliding scale as needed for  sugars >250.  Please return in 3-4 months with your sugar log.   - continue checking sugars at different times of the day - Return to clinic in 3 mo with sugar log

## 2016-04-27 ENCOUNTER — Other Ambulatory Visit: Payer: Self-pay | Admitting: *Deleted

## 2016-04-27 MED ORDER — FUROSEMIDE 20 MG PO TABS: 40.0000 mg | ORAL_TABLET | ORAL | 6 refills | Status: DC

## 2016-05-08 ENCOUNTER — Telehealth: Payer: Self-pay

## 2016-05-08 NOTE — Telephone Encounter (Signed)
Let's keep the doses of NPH the same for now, but please let us know if the sugars continue to stay high: - NPH 6 units in am (before b'fast) and 8 units at bedtime. - Humalog Sliding scale as needed for sugars >250:  250-300: 2 units >300: 3 units

## 2016-05-08 NOTE — Telephone Encounter (Signed)
Called and spoke with patient daughter about insulin question from this morning. Patient daughter understood and had no other questions.

## 2016-05-08 NOTE — Telephone Encounter (Signed)
Patient daughter called about blood sugars. States last night they checked him before his tube feedings and he was 299, and then checked it again later and it was 259. Patient daughter states this is not an occurrence, but would like to know what to do if it happens again, if it would be alright to give him the humalog following the sliding scale, or do the NPH at bedtime and not do the humalog. Patient also not sure on what sliding scale, she states that at the last visit it was changed. Please advise, thank you!

## 2016-05-15 ENCOUNTER — Telehealth: Payer: Self-pay

## 2016-05-15 NOTE — Telephone Encounter (Signed)
For that particular night, they can use: - NPH 6 units in am and 5 (decreased from 8) units at bedtime.

## 2016-05-15 NOTE — Telephone Encounter (Signed)
Called and advised daughter Lawson Fiscal of changes in insulin if they needed to change his feeding tube hours on Monday. She had no questions.

## 2016-05-15 NOTE — Telephone Encounter (Signed)
Patient daughter calls and states that they just realiazed they are almost out of feeding for his tube feedings, she states that she is in contact with the home health to get some in before Monday, but in the case that they do not get the whole feedings would you need to change his insulin dosages? Patient normally gets 8 hours of food through the night, they have cans but they only last 4 hours. The only day they are concerned about is Monday in which case he would only get the 4 hours of food and not the full 8. They are just wanting to make sure his insulins would be okay where they are, or would we need to change it in the case that this was to happen? Please advise. Thank you!

## 2016-05-22 ENCOUNTER — Telehealth: Payer: Self-pay | Admitting: Internal Medicine

## 2016-05-22 ENCOUNTER — Other Ambulatory Visit: Payer: Self-pay

## 2016-05-22 MED ORDER — INSULIN PEN NEEDLE 32G X 4 MM MISC: 11 refills | Status: DC

## 2016-05-22 NOTE — Telephone Encounter (Signed)
Pt is in need of the [facility] in [place] to have the pen needles relion 50

## 2016-06-19 ENCOUNTER — Ambulatory Visit (INDEPENDENT_AMBULATORY_CARE_PROVIDER_SITE_OTHER): Payer: Medicare Other | Admitting: Cardiovascular Disease

## 2016-06-19 ENCOUNTER — Encounter: Payer: Self-pay | Admitting: Cardiovascular Disease

## 2016-06-19 VITALS — BP 116/54 | HR 62 | Ht 71.0 in | Wt 144.4 lb

## 2016-06-19 DIAGNOSIS — I5022 Chronic systolic (congestive) heart failure: Secondary | ICD-10-CM

## 2016-06-19 DIAGNOSIS — Z951 Presence of aortocoronary bypass graft: Secondary | ICD-10-CM | POA: Diagnosis not present

## 2016-06-19 DIAGNOSIS — E7800 Pure hypercholesterolemia, unspecified: Secondary | ICD-10-CM

## 2016-06-19 DIAGNOSIS — I25810 Atherosclerosis of coronary artery bypass graft(s) without angina pectoris: Secondary | ICD-10-CM

## 2016-06-19 DIAGNOSIS — I6523 Occlusion and stenosis of bilateral carotid arteries: Secondary | ICD-10-CM | POA: Diagnosis not present

## 2016-06-19 DIAGNOSIS — I10 Essential (primary) hypertension: Secondary | ICD-10-CM

## 2016-06-19 DIAGNOSIS — I252 Old myocardial infarction: Secondary | ICD-10-CM

## 2016-06-19 DIAGNOSIS — R600 Localized edema: Secondary | ICD-10-CM

## 2016-06-19 MED ORDER — FUROSEMIDE 20 MG PO TABS: 40.0000 mg | ORAL_TABLET | ORAL | Status: DC

## 2016-06-19 MED ORDER — ATORVASTATIN CALCIUM 40 MG PO TABS: 40.0000 mg | ORAL_TABLET | Freq: Every day | ORAL | 3 refills | Status: DC

## 2016-06-19 NOTE — Progress Notes (Signed)
SUBJECTIVE: The patient presents for follow-up of chronic systolic heart failure. He has a history of CABG, non-STEMI, and DVT. Carotid Dopplers 03/19/16 40-59% right internal carotid artery and 60-79% left internal carotid artery stenosis.  The patient denies any chest pain and shortness of breath. Denies abdominal distention. Says leg swelling has improved significantly.   Review of Systems: As per "subjective", otherwise negative.  No Known Allergies  Current Outpatient Prescriptions  Medication Sig Dispense Refill  . aspirin EC 81 MG EC tablet Take 1 tablet (81 mg total) by mouth daily.    Marland Kitchen. atorvastatin (LIPITOR) 40 MG tablet Take 1 tablet (40 mg total) by mouth daily. 30 tablet 6  . Cholecalciferol (VITAMIN D-3) 1000 units CAPS Take 1 capsule by mouth daily.     . cholestyramine light (PREVALITE) 4 g packet Take 4 g by mouth 2 (two) times daily.    . folic acid-pyridoxine-cyancobalamin (FOLTX) 2.5-25-2 MG TABS tablet Take 1 tablet by mouth daily.     . Folic Acid-Vit B6-Vit B12 (FOLBEE) 2.5-25-1 MG TABS tablet Take 1 tablet by mouth daily.    . furosemide (LASIX) 20 MG tablet Take 2 tablets (40 mg total) by mouth every morning. & 20 mg at 4:00 pm in the evening 90 tablet 6  . glucose blood (ONETOUCH VERIO) test strip Use to test blood sugar 2 times daily as instructed. Dx: E11.59 200 each 3  . insulin lispro (HUMALOG KWIKPEN) 100 UNIT/ML KiwkPen Inject 0.02-0.1 mLs (2-10 Units total) into the skin 3 (three) times daily as needed (increased BS). Per sliding scale for blood sugar > 200 units 15 mL 2  . insulin NPH Human (HUMULIN N) 100 UNIT/ML injection Inject 6 units before b'fast and another 8 units at bedtime. PENS, PLEASE!! 15 mL 2  . Insulin Pen Needle (CAREFINE PEN NEEDLES) 32G X 4 MM MISC Use 2x a day 100 each 11  . latanoprost (XALATAN) 0.005 % ophthalmic solution Place 1 drop into both eyes at bedtime.    . lipase/protease/amylase (CREON) 36000 UNITS CPEP capsule Take  24,000 Units by mouth 3 (three) times daily before meals.    . metoprolol succinate (TOPROL-XL) 25 MG 24 hr tablet Take 1 tablet (25 mg total) by mouth daily. 30 tablet 6  . Multiple Vitamins-Minerals (CENTRUM SILVER PO) Take 5 mLs by mouth daily.     . ondansetron (ZOFRAN) 4 MG tablet Take 4 mg by mouth every 8 (eight) hours as needed for nausea or vomiting.    Letta Pate. ONETOUCH DELICA LANCETS 33G MISC Use to test blood sugar 2 times daily. Dx: E11.59 200 each 3  . pantoprazole (PROTONIX) 40 MG tablet Take 40 mg by mouth 2 (two) times daily.    Marland Kitchen. thiamine (VITAMIN B-1) 50 MG tablet Take 50 mg by mouth daily.     . vitamin C (ASCORBIC ACID) 500 MG tablet Take 500 mg by mouth daily.     . Zinc 50 MG CAPS Take 1 capsule by mouth daily.      No current facility-administered medications for this visit.     Past Medical History:  Diagnosis Date  . Cataract   . Cholelithiasis   . Diabetes mellitus type 2 in obese (HCC)   . DVT (deep venous thrombosis) (HCC)   . GERD (gastroesophageal reflux disease)   . Hypertension   . MI (myocardial infarction) 01/2015  . Pancreatitis 01/2015    Past Surgical History:  Procedure Laterality Date  . CARDIAC CATHETERIZATION N/A  01/17/2015   Procedure: Left Heart Cath and Coronary Angiography;  Surgeon: Lyn Records, MD;  Location: [facility] INVASIVE CV LAB;  Service: Cardiovascular;  Laterality: N/A;  . CORONARY ARTERY BYPASS GRAFT N/A 02/14/2015   Procedure: CORONARY ARTERY BYPASS GRAFTING (CABG) x three, using left internal mammary artery and left leg greater saphenous vein harvested endoscopically;  Surgeon: Kerin Perna, MD;  Location: [facility] OR;  Service: Open Heart Surgery;  Laterality: N/A;  . ESOPHAGOGASTRODUODENOSCOPY N/A 01/21/2015   Procedure: ESOPHAGOGASTRODUODENOSCOPY (EGD);  Surgeon: Hilarie Fredrickson, MD;  Location: [facility] ENDOSCOPY;  Service: Endoscopy;  Laterality: N/A;  to be done at BEDSIDE  . ESOPHAGOGASTRODUODENOSCOPY N/A 02/05/2015   Procedure:  ESOPHAGOGASTRODUODENOSCOPY (EGD);  Surgeon: Hart Carwin, MD;  Location: [facility] ENDOSCOPY;  Service: Endoscopy;  Laterality: N/A;  . TEE WITHOUT CARDIOVERSION N/A 02/14/2015   Procedure: TRANSESOPHAGEAL ECHOCARDIOGRAM (TEE);  Surgeon: Kerin Perna, MD;  Location: [facility] OR;  Service: Open Heart Surgery;  Laterality: N/A;    Social History   Social History  . Marital status: Married    Spouse name: N/A  . Number of children: N/A  . Years of education: N/A   Occupational History  . Not on file.   Social History Main Topics  . Smoking status: Never Smoker  . Smokeless tobacco: Never Used  . Alcohol use No  . Drug use: No  . Sexual activity: Yes   Other Topics Concern  . Not on file   Social History Narrative  . No narrative on file     Vitals:   06/19/16 0843  BP: (!) 116/54  Pulse: 62  SpO2: 92%  Weight: 144 lb 6.4 oz (65.5 kg)  Height: 5\' 11"  (1.803 m)    PHYSICAL EXAM General: NAD HEENT: Normal. Neck: No JVD, no thyromegaly. Lungs: Clear to auscultation bilaterally with normal respiratory effort. CV: Nondisplaced PMI. Regular rate and rhythm, normal S1/S2, no S3/S4, no murmur. Trace pretibial edema.  Abdomen: Soft, no distention.  Neurologic: Alert and oriented.  Psych: Normal affect.    ECG: Most recent ECG reviewed.      ASSESSMENT AND PLAN: 1. CAD with 3-vessel CABG and non-STEMI: Symptomatically stable. Will continue aspirin, Lipitor, and metoprolol succinate 25 mg daily. No longer on ACEI.  2. Chronic systolic heart failure/ischemic cardiomyopathy, EF 45%/bilateral leg edema: Will continue Lasix 40 mg q am and 20-40 mg q pm prn for weight gain of 3 lbs or more in 24 hrs. Instructed about the importance of daily weights.  3. DVT: On warfarin. IVC filter removed in 08/2015. No longer on anticoagulation.  4. Hyperlipidemia: On Lipitor 40 mg. No changes. Will refill.  5. Essential HTN: Controlled. No changes.  6. Bilateral carotid artery  stenosis: Most recent results reviewed above. Repeat in July 2018. Continue ASA and statin.  Dispo: f/u 6 months.   Prentice Docker, M.D., F.A.C.C.

## 2016-06-19 NOTE — Patient Instructions (Signed)
Medication Instructions:   Lipitor refill sent to pharmacy today.  May take an extra 20-40mg  of your Lasix for weight gain of 3 pounds in 24 hour time frame.  Continue all other medications.    Labwork: none  Testing/Procedures: none  Follow-Up: Your physician wants you to follow up in: 6 months.  You will receive a reminder letter in the mail one-two months in advance.  If you don't receive a letter, please call our office to schedule the follow up appointment   Any Other Special Instructions Will Be Listed Below (If Applicable).  If you need a refill on your cardiac medications before your next appointment, please call your pharmacy.

## 2016-07-03 ENCOUNTER — Telehealth: Payer: Self-pay | Admitting: Internal Medicine

## 2016-07-03 ENCOUNTER — Telehealth: Payer: Self-pay

## 2016-07-03 NOTE — Telephone Encounter (Signed)
Great news! He can decrease the NPH to 6 mg in am and 6 mg at bedtime. If he has lows in am, let me know, as we need to decrease evening NPH to 4 units.

## 2016-07-03 NOTE — Telephone Encounter (Signed)
Called and spoke with patient daughter, advised of Dr.Gherghe's note, I advised Dr.Gherghe he is cutting back to one can and not two and advised of insulin changes. Patient daughter had no questions at this time.

## 2016-07-03 NOTE — Telephone Encounter (Signed)
Pt daughter called and was saying that he will be coming off of his feeding tube soon and she wanted to make sure that the insulin dosage that he is currently getting will still be the same. Lawson Fiscal [phone]

## 2016-08-06 ENCOUNTER — Encounter: Payer: Self-pay | Admitting: Internal Medicine

## 2016-08-06 ENCOUNTER — Ambulatory Visit (INDEPENDENT_AMBULATORY_CARE_PROVIDER_SITE_OTHER): Payer: Medicare Other | Admitting: Internal Medicine

## 2016-08-06 VITALS — BP 120/62 | HR 67 | Wt 160.0 lb

## 2016-08-06 DIAGNOSIS — E118 Type 2 diabetes mellitus with unspecified complications: Secondary | ICD-10-CM

## 2016-08-06 LAB — HEMOGLOBIN A1C: HEMOGLOBIN A1C: 6.8 % — AB (ref 4.6–6.5)

## 2016-08-06 MED ORDER — INSULIN NPH (HUMAN) (ISOPHANE) 100 UNIT/ML ~~LOC~~ SUSP: SUBCUTANEOUS | 2 refills | Status: DC

## 2016-08-06 MED ORDER — GLUCOSE BLOOD VI STRP: ORAL_STRIP | 3 refills | Status: DC

## 2016-08-06 NOTE — Progress Notes (Signed)
Patient ID: [name] [name], male   DOB: 02/06/1941, 75 y.o.   MRN: [id number]  HPI: [name] [name] is a 75 y.o.-year-old male, initially referred by his gastroenterologist, Dr Leone Payor, returning for follow-up for DM2, dx in ~2006, insulin-dependent, uncontrolled, with complications (CAD, s/p AMI, s/p CABG; CKD). Last visit 5 mo ago.   Pt is now on TFs, but will stop in few days.  He had a stage 4 buttock ulcer >> seeing wound care  - had debridement.  Reviewed hx: Patient was admitted in 01/2015 with acute biliary pancreatitis secondary to gallstones and he had a very complicated hospitalization. He had an MI and had to have 3 vessel CABG during that hospitalization. He also developed esophagitis with GI bleeding. He is now slowly improving and continues to see Dr. Leone Payor for his pancreatitis. On the latest CT scan, there was a peripancreatic fluid collection, ? Necrosis. He has diarrhea >> started Creon >> a little better.   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  He had cholecystectomy in 07/2015 and had 3 ERCPs and had the IVC filter extracted.  Last hemoglobin A1c was: Lab Results  Component Value Date   HGBA1C 5.9 04/06/2016   HGBA1C 5.6 12/12/2015   HGBA1C 6.3 09/12/2015   Pt was on a regimen of: - Metformin 500 mg 2x a day, with meals (losing weight on a higher dose) - Lantus 8 >> 10 units in am - Humalog SSI units 3x a day, before meals: 201-250: + 2 units 251-300: + 3 units > 300: + 4 units He was on Glipizide before the pancreatitis. He was on Januvia 6 mo ago.  Now on tube feeds (continuous - Vital 1.5 - 10 pm to 2:30 am): - NPH 6 units in am and 6 units at bedtime (decreased the dose 07/03/2016)  Pt checks his sugars 2x a  day and they are: - 6 am: 200-210 >> 154-266 >> 82-175 (most 120s-150s) >> 93-147, 155, 175 - before lunch: 94, 105 >> n/c - after lunch:  111-167 >> n/c - 4 pm (before feeds): 79-177 >> before dinner: 85-172, 190 >> n/c - 9 pm (bedtime): 94, 192- >> 74-194 >> 111, 120-187, 195  Previously: - am: 108, 122-170, 180, 220 >> 90-140, 160, 190  - 2h after b'fast: n/c - before lunch: 187-271 >> 186-210 - 2h after lunch: n/c - before dinner: 113-236 >> 147-201 - 2h after dinner: n/c - bedtime: n/c - nighttime: n/c No lows. Lowest sugar was 70 (on Glipizide) >> 80s >> 90 >> 79; he has hypoglycemia awareness at 70.  Highest sugar was 270s >> 271 >> 290 >> 266.  Before tube feeds, Pt's meals were: - Breakfast: cereal + strawberries (Special K) + oatmeal; toast + eggs; scrambled eggs; cheese toast - Lunch: potatoes, casseroles, chicken - Dinner: sandwich or soup, used to eat out - Snacks: yoghurt, cheese; no snacks at night  - + CKD, last BUN/creatinine:  07/07/2016: 32/1.53, albumin 2.5 Lab Results  Component Value Date   BUN 15 08/26/2015   CREATININE 0.93 08/26/2015  He is on lisinopril. - last set of lipids: Lab Results  Component Value Date   CHOL 104 01/15/2015   HDL 26 (L) 01/15/2015   LDLCALC 64 01/15/2015  TRIG 195 (H) 01/19/2015   CHOLHDL 4.0 01/15/2015  He is on Lipitor. - last eye exam was in 04/2015. No DR.  - no numbness and tingling in his feet.  ROS: Constitutional: + weight gain, no fatigue, no subjective hyperthermia/hypothermia Eyes: no blurry vision, no xerophthalmia ENT: no sore throat, no nodules palpated in throat, no dysphagia/odynophagia, no hoarseness Cardiovascular: no CP/SOB/palpitations/leg swelling Respiratory: no cough/ no SOB Gastrointestinal: no N/V/D/C Musculoskeletal: no muscle/joint aches Skin: no rashes Neurological: no tremors/numbness/tingling/dizziness  I reviewed pt's medications, allergies, PMH, social hx, family hx, and changes  were documented in the history of present illness. Otherwise, unchanged from my initial visit note.  Past Medical History:  Diagnosis Date  . Cataract   . Cholelithiasis   . Diabetes mellitus type 2 in obese (HCC)   . DVT (deep venous thrombosis) (HCC)   . GERD (gastroesophageal reflux disease)   . Hypertension   . MI (myocardial infarction) 01/2015  . Pancreatitis 01/2015   Past Surgical History:  Procedure Laterality Date  . CARDIAC CATHETERIZATION N/A 01/17/2015   Procedure: Left Heart Cath and Coronary Angiography;  Surgeon: Lyn Records, MD;  Location: [facility] INVASIVE CV LAB;  Service: Cardiovascular;  Laterality: N/A;  . CORONARY ARTERY BYPASS GRAFT N/A 02/14/2015   Procedure: CORONARY ARTERY BYPASS GRAFTING (CABG) x three, using left internal mammary artery and left leg greater saphenous vein harvested endoscopically;  Surgeon: Kerin Perna, MD;  Location: [facility] OR;  Service: Open Heart Surgery;  Laterality: N/A;  . ESOPHAGOGASTRODUODENOSCOPY N/A 01/21/2015   Procedure: ESOPHAGOGASTRODUODENOSCOPY (EGD);  Surgeon: Hilarie Fredrickson, MD;  Location: [facility] ENDOSCOPY;  Service: Endoscopy;  Laterality: N/A;  to be done at BEDSIDE  . ESOPHAGOGASTRODUODENOSCOPY N/A 02/05/2015   Procedure: ESOPHAGOGASTRODUODENOSCOPY (EGD);  Surgeon: Hart Carwin, MD;  Location: [facility] ENDOSCOPY;  Service: Endoscopy;  Laterality: N/A;  . TEE WITHOUT CARDIOVERSION N/A 02/14/2015   Procedure: TRANSESOPHAGEAL ECHOCARDIOGRAM (TEE);  Surgeon: Kerin Perna, MD;  Location: [facility] OR;  Service: Open Heart Surgery;  Laterality: N/A;   Social History   Social History  . Marital Status: Married    Spouse Name: N/A  . Number of Children: N/A   Social History Main Topics  . Smoking status: Never Smoker   . Smokeless tobacco: Never Used  . Alcohol Use: No  . Drug Use: No   Current Outpatient Prescriptions on File Prior to Visit  Medication Sig Dispense Refill  . aspirin EC 81 MG EC tablet Take 1 tablet (81 mg total) by mouth daily.     Marland Kitchen atorvastatin (LIPITOR) 40 MG tablet Take 1 tablet (40 mg total) by mouth daily. 90 tablet 3  . Cholecalciferol (VITAMIN D-3) 1000 units CAPS Take 1 capsule by mouth daily.     . cholestyramine light (PREVALITE) 4 g packet Take 4 g by mouth 2 (two) times daily.    . folic acid-pyridoxine-cyancobalamin (FOLTX) 2.5-25-2 MG TABS tablet Take 1 tablet by mouth daily.     . Folic Acid-Vit B6-Vit B12 (FOLBEE) 2.5-25-1 MG TABS tablet Take 1 tablet by mouth daily.    . furosemide (LASIX) 20 MG tablet Take 2 tablets (40 mg total) by mouth every morning. & may take an extra 20-40mg  as needed for weight gain greater that 3 pounds in 24 hour time frame.    Marland Kitchen glucose blood (ONETOUCH VERIO) test strip Use to test blood sugar 2 times daily as instructed. Dx: E11.59 200 each 3  . insulin lispro (HUMALOG KWIKPEN) 100 UNIT/ML KiwkPen Inject  0.02-0.1 mLs (2-10 Units total) into the skin 3 (three) times daily as needed (increased BS). Per sliding scale for blood sugar > 200 units 15 mL 2  . insulin NPH Human (HUMULIN N) 100 UNIT/ML injection Inject 6 units before b'fast and another 8 units at bedtime. PENS, PLEASE!! 15 mL 2  . Insulin Pen Needle (CAREFINE PEN NEEDLES) 32G X 4 MM MISC Use 2x a day 100 each 11  . latanoprost (XALATAN) 0.005 % ophthalmic solution Place 1 drop into both eyes at bedtime.    . lipase/protease/amylase (CREON) 36000 UNITS CPEP capsule Take 24,000 Units by mouth 3 (three) times daily before meals.    . metoprolol succinate (TOPROL-XL) 25 MG 24 hr tablet Take 1 tablet (25 mg total) by mouth daily. 30 tablet 6  . Multiple Vitamins-Minerals (CENTRUM SILVER PO) Take 5 mLs by mouth daily.     . ondansetron (ZOFRAN) 4 MG tablet Take 4 mg by mouth every 8 (eight) hours as needed for nausea or vomiting.    Letta Pate. ONETOUCH DELICA LANCETS 33G MISC Use to test blood sugar 2 times daily. Dx: E11.59 200 each 3  . pantoprazole (PROTONIX) 40 MG tablet Take 40 mg by mouth 2 (two) times daily.    Marland Kitchen. thiamine  (VITAMIN B-1) 50 MG tablet Take 50 mg by mouth daily.     . vitamin C (ASCORBIC ACID) 500 MG tablet Take 500 mg by mouth daily.     . Zinc 50 MG CAPS Take 1 capsule by mouth daily.      No current facility-administered medications on file prior to visit.    No Known Allergies Family History  Problem Relation Age of Onset  . Heart attack Brother 55  . Pancreatic cancer Sister 4070    died in her 2070s.    PE: BP 120/62   Pulse 67   Wt 160 lb (72.6 kg)   BMI 22.32 kg/m  Body mass index is 22.32 kg/m.  Wt Readings from Last 3 Encounters:  08/06/16 160 lb (72.6 kg)  06/19/16 144 lb 6.4 oz (65.5 kg)  04/06/16 137 lb (62.1 kg)   Constitutional: thin, in NAD Eyes: PERRLA, EOMI, no exophthalmos ENT: moist mucous membranes, no thyromegaly, no cervical lymphadenopathy Cardiovascular: RRR, No MRG, no LE edema  Respiratory: CTA B Gastrointestinal: abdomen soft, NT, ND, BS+ Musculoskeletal: no deformities, strength intact in all 4 Skin: moist, warm, no rashes Neurological: no tremor with outstretched hands, DTR normal in all 4  ASSESSMENT: 1. DM2, insulin-dependent, uncontrolled, with complications - CAD, s/p AMI, s/p 3v CABG - CKD  PLAN:  1. Patient with insulin-dependent diabetes, likely due to pancreatitis. He is on TF's, now reduced to 1 can a night but will stop soon >> will reduce NPH dose at night. I advised him to check some sugars midday so we can adjust NPH dose in am, also. - will check his HbA1c today. Last was 5.9% (great!). - I suggested to:  Patient Instructions  Please decrease NPH to 6 units in am and 4 units at bedtime.  Check some sugars before lunch and dinner.  Let me know if sugars stay <100 or >190s.  Please return in 3 months with your sugar log.   - continue checking sugars at different times of the day - needs a new eye exam - Return to clinic in 3 mo with sugar log   Lab Results  Component Value Date   HGBA1C 6.8 (H) 08/06/2016  HbA1c is higher  than  before, but will go ahead with the insulin dose change as per above plan.  Carlus Pavlov, MD PhD [facility] Endocrinology

## 2016-08-06 NOTE — Patient Instructions (Addendum)
Please decrease NPH to 6 units in am and 4 units at bedtime.  Check some sugars before lunch and dinner.  Let me know if sugars stay <100 or >190s.  Please return in 3 months with your sugar log.

## 2016-08-19 ENCOUNTER — Telehealth: Payer: Self-pay | Admitting: *Deleted

## 2016-08-19 NOTE — Telephone Encounter (Signed)
Before switching to torsemide, I would want to make sure that he's not responding to Lasix. He's on a low dose. If need be, increase Lasix to 40 mg bid (at least for 3 days) to reduce swelling.

## 2016-08-19 NOTE — Telephone Encounter (Signed)
Daughter Carmelina Noun) notified.

## 2016-08-19 NOTE — Telephone Encounter (Signed)
Increased swelling in legs over last 1-2 months, on Lasix 40 am & 20 in the pm - just started back with evening dose recently.  Has not had to do the extra 20 in the evening due to not being at that 3 pound gain in 24 hours.  Has had sacral wound x 1 mo , closed & then opened back up -  tries to be up - noticing edema up into thighs some also.  Weight is where he needs to be on his tube feeding 159-160lb.  BP on lower end 104/60 last few days.  Daughter questions if Torsemide is an option for him.  No chest pain, sob, dizziness.

## 2016-09-04 ENCOUNTER — Encounter: Payer: Self-pay | Admitting: Cardiovascular Disease

## 2016-09-04 ENCOUNTER — Ambulatory Visit (INDEPENDENT_AMBULATORY_CARE_PROVIDER_SITE_OTHER): Payer: Medicare Other | Admitting: Cardiovascular Disease

## 2016-09-04 VITALS — BP 119/73 | HR 69 | Ht 71.0 in | Wt 162.0 lb

## 2016-09-04 DIAGNOSIS — I10 Essential (primary) hypertension: Secondary | ICD-10-CM

## 2016-09-04 DIAGNOSIS — I252 Old myocardial infarction: Secondary | ICD-10-CM

## 2016-09-04 DIAGNOSIS — I5023 Acute on chronic systolic (congestive) heart failure: Secondary | ICD-10-CM | POA: Diagnosis not present

## 2016-09-04 DIAGNOSIS — R600 Localized edema: Secondary | ICD-10-CM | POA: Diagnosis not present

## 2016-09-04 DIAGNOSIS — Z951 Presence of aortocoronary bypass graft: Secondary | ICD-10-CM

## 2016-09-04 DIAGNOSIS — I25810 Atherosclerosis of coronary artery bypass graft(s) without angina pectoris: Secondary | ICD-10-CM

## 2016-09-04 DIAGNOSIS — I6523 Occlusion and stenosis of bilateral carotid arteries: Secondary | ICD-10-CM

## 2016-09-04 DIAGNOSIS — E7800 Pure hypercholesterolemia, unspecified: Secondary | ICD-10-CM

## 2016-09-04 MED ORDER — METOLAZONE 2.5 MG PO TABS: 2.5000 mg | ORAL_TABLET | Freq: Every day | ORAL | 0 refills | Status: DC

## 2016-09-04 MED ORDER — TORSEMIDE 20 MG PO TABS: 20.0000 mg | ORAL_TABLET | Freq: Two times a day (BID) | ORAL | 3 refills | Status: DC

## 2016-09-04 NOTE — Patient Instructions (Signed)
Your physician recommends that you schedule a follow-up appointment in: 1 MONTH WITH DR. Purvis Sheffield  Your physician has recommended you make the following change in your medication:   STOP LASIX  START TORSEMIDE 20 MG TWICE DAILY  START METOLAZONE 2.5 MG DAILY FOR 3 DAYS  Your physician recommends that you return for lab work ON 09/08/16 Tuesday  Thank you for choosing [facility]!!

## 2016-09-04 NOTE — Progress Notes (Signed)
SUBJECTIVE: The patient presents for follow-up of chronic systolic heart failure. He has a history of CABG, non-STEMI, and DVT. Carotid Dopplers 03/19/16 40-59% right internal carotid artery and 60-79% left internal carotid artery stenosis.  Wt 162 lbs (144 lbs on 06/19/16)  He has been experiencing a progressive swelling of both legs up to his knees and into his thighs which has limited his ability to do exercises. Denies chest pain.  Social history: His daughter is a retired PA who worked for the [employer] for 20 years.   Review of Systems: As per "subjective", otherwise negative.  No Known Allergies  Current Outpatient Prescriptions  Medication Sig Dispense Refill  . aspirin EC 81 MG EC tablet Take 1 tablet (81 mg total) by mouth daily.    Marland Kitchen atorvastatin (LIPITOR) 40 MG tablet Take 1 tablet (40 mg total) by mouth daily. 90 tablet 3  . Cholecalciferol (VITAMIN D-3) 1000 units CAPS Take 1 capsule by mouth daily.     . Folic Acid-Vit B6-Vit B12 (FOLBEE) 2.5-25-1 MG TABS tablet Take 1 tablet by mouth daily.    . furosemide (LASIX) 20 MG tablet Take 2 tablets (40 mg total) by mouth every morning. & may take an extra 20-40mg  as needed for weight gain greater that 3 pounds in 24 hour time frame.    Marland Kitchen glucose blood (ONETOUCH VERIO) test strip Use to test blood sugar 2 times daily as instructed. Dx: E11.59 200 each 3  . insulin NPH Human (HUMULIN N) 100 UNIT/ML injection Inject 6 units before b'fast and another 4 units at bedtime. PENS, PLEASE!! 15 mL 2  . Insulin Pen Needle (CAREFINE PEN NEEDLES) 32G X 4 MM MISC Use 2x a day 100 each 11  . latanoprost (XALATAN) 0.005 % ophthalmic solution Place 1 drop into both eyes at bedtime.    . lipase/protease/amylase (CREON) 36000 UNITS CPEP capsule Take 24,000 Units by mouth 3 (three) times daily before meals.    . Loperamide HCl (IMODIUM PO) Take by mouth at bedtime. 1-2 Tablets @@ bed time    . metoprolol succinate  (TOPROL-XL) 25 MG 24 hr tablet Take 1 tablet (25 mg total) by mouth daily. 30 tablet 6  . Multiple Vitamins-Minerals (CENTRUM SILVER PO) Take 5 mLs by mouth daily.     . ondansetron (ZOFRAN) 4 MG tablet Take 4 mg by mouth every 8 (eight) hours as needed for nausea or vomiting.    Letta Pate DELICA LANCETS 33G MISC Use to test blood sugar 2 times daily. Dx: E11.59 200 each 3  . pantoprazole (PROTONIX) 40 MG tablet Take 40 mg by mouth 2 (two) times daily.    Marland Kitchen thiamine (VITAMIN B-1) 50 MG tablet Take 50 mg by mouth daily.     . vitamin C (ASCORBIC ACID) 500 MG tablet Take 500 mg by mouth daily.     . Zinc 50 MG CAPS Take 45 mg by mouth as directed.      No current facility-administered medications for this visit.     Past Medical History:  Diagnosis Date  . Cataract   . Cholelithiasis   . Diabetes mellitus type 2 in obese (HCC)   . DVT (deep venous thrombosis) (HCC)   . GERD (gastroesophageal reflux disease)   . Hypertension   . MI (myocardial infarction) 01/2015  . Pancreatitis 01/2015    Past Surgical History:  Procedure Laterality Date  . CARDIAC CATHETERIZATION N/A 01/17/2015   Procedure: Left Heart Cath and  Coronary Angiography;  Surgeon: Lyn RecordsHenry W Smith, MD;  [facility];  Service: Cardiovascular;  Laterality: N/A;  . CORONARY ARTERY BYPASS GRAFT N/A 02/14/2015   Procedure: CORONARY ARTERY BYPASS GRAFTING (CABG) x three, using left internal mammary artery and left leg greater saphenous vein harvested endoscopically;  Surgeon: Kerin PernaPeter Van Trigt, MD;  [facility];  Service: Open Heart Surgery;  Laterality: N/A;  . ESOPHAGOGASTRODUODENOSCOPY N/A 01/21/2015   Procedure: ESOPHAGOGASTRODUODENOSCOPY (EGD);  Surgeon: Hilarie FredricksonJohn N Perry, MD;  [facility];  Service: Endoscopy;  Laterality: N/A;  to be done at BEDSIDE  . ESOPHAGOGASTRODUODENOSCOPY N/A 02/05/2015   Procedure: ESOPHAGOGASTRODUODENOSCOPY (EGD);  Surgeon: Hart Carwinora M Brodie, MD;  [facility];  Service:  Endoscopy;  Laterality: N/A;  . TEE WITHOUT CARDIOVERSION N/A 02/14/2015   Procedure: TRANSESOPHAGEAL ECHOCARDIOGRAM (TEE);  Surgeon: Kerin PernaPeter Van Trigt, MD;  [facility];  Service: Open Heart Surgery;  Laterality: N/A;    Social History   Social History  . Marital status: Married    Spouse name: N/A  . Number of children: N/A  . Years of education: N/A   Occupational History  . Not on file.   Social History Main Topics  . Smoking status: Never Smoker  . Smokeless tobacco: Never Used  . Alcohol use No  . Drug use: No  . Sexual activity: Yes   Other Topics Concern  . Not on file   Social History Narrative  . No narrative on file     Vitals:   09/04/16 1009  BP: 119/73  Pulse: 69  Weight: 162 lb (73.5 kg)  Height: 5\' 11"  (1.803 m)    PHYSICAL EXAM General: NAD HEENT: Normal. Neck: No JVD, no thyromegaly. Lungs: Clear to auscultation bilaterally with normal respiratory effort. CV: Nondisplaced PMI. Regular rate and rhythm, normal S1/S2, no S3/S4, no murmur. 1+ pitting pretibial edema up to mid thighs b/l. Left carotid bruit. Abdomen: Mild distention.  Neurologic: Alert and oriented.  Psych: Normal affect.    ECG: Most recent ECG reviewed.      ASSESSMENT AND PLAN: 1. CAD with 3-vessel CABG and non-STEMI: Symptomatically stable. Will continue aspirin, Lipitor, and metoprolol succinate 25 mg daily. No longer on ACEI.  2. Acute on chronic systolic heart failure/ischemic cardiomyopathy, EF 45%/bilateral leg edema: Will switch Lasix to torsemide 20 mg bid. Will give metolazone 2.5 mg daily x 3 days. Check BMET 09/08/16.  3. DVT: On warfarin. IVC filter removed in 08/2015. No longer on anticoagulation.  4. Hyperlipidemia: On Lipitor 40 mg. No changes.  5. Essential HTN: Controlled. No changes.  6. Bilateral carotid artery stenosis: Most recent results reviewed above. Repeat in July 2018. Continue ASA and statin.  Dispo: f/u 1 month.   Prentice DockerSuresh  Koneswaran, M.D., F.A.C.C.

## 2016-09-08 ENCOUNTER — Telehealth: Payer: Self-pay | Admitting: Cardiovascular Disease

## 2016-09-08 NOTE — Telephone Encounter (Signed)
torsemide (DEMADEX) 20 MG tablet - Started this past Friday and still taking   metolazone (ZAROXOLYN) 2.5 MG tablet - Started Saturday and did not take any more doses due to BP being low

## 2016-10-07 ENCOUNTER — Ambulatory Visit (INDEPENDENT_AMBULATORY_CARE_PROVIDER_SITE_OTHER): Payer: Medicare Other | Admitting: Cardiovascular Disease

## 2016-10-07 ENCOUNTER — Encounter: Payer: Self-pay | Admitting: Cardiovascular Disease

## 2016-10-07 ENCOUNTER — Other Ambulatory Visit: Payer: Self-pay | Admitting: Cardiovascular Disease

## 2016-10-07 VITALS — BP 114/68 | HR 64 | Ht 71.0 in | Wt 147.0 lb

## 2016-10-07 DIAGNOSIS — I252 Old myocardial infarction: Secondary | ICD-10-CM | POA: Diagnosis not present

## 2016-10-07 DIAGNOSIS — Z79899 Other long term (current) drug therapy: Secondary | ICD-10-CM | POA: Diagnosis not present

## 2016-10-07 DIAGNOSIS — I10 Essential (primary) hypertension: Secondary | ICD-10-CM | POA: Diagnosis not present

## 2016-10-07 DIAGNOSIS — I5023 Acute on chronic systolic (congestive) heart failure: Secondary | ICD-10-CM

## 2016-10-07 DIAGNOSIS — R600 Localized edema: Secondary | ICD-10-CM

## 2016-10-07 DIAGNOSIS — E7800 Pure hypercholesterolemia, unspecified: Secondary | ICD-10-CM

## 2016-10-07 DIAGNOSIS — I25810 Atherosclerosis of coronary artery bypass graft(s) without angina pectoris: Secondary | ICD-10-CM

## 2016-10-07 DIAGNOSIS — I6523 Occlusion and stenosis of bilateral carotid arteries: Secondary | ICD-10-CM

## 2016-10-07 DIAGNOSIS — Z951 Presence of aortocoronary bypass graft: Secondary | ICD-10-CM

## 2016-10-07 MED ORDER — TORSEMIDE 20 MG PO TABS: 20.0000 mg | ORAL_TABLET | Freq: Three times a day (TID) | ORAL | 3 refills | Status: DC

## 2016-10-07 NOTE — Progress Notes (Signed)
SUBJECTIVE: The patient presents for follow-up of chronic systolic heart failure. He has a history of CABG, non-STEMI, and DVT. Carotid Dopplers 03/19/16 40-59% right internal carotid artery and 60-79% left internal carotid artery stenosis.  Wt 147 lbs (162 lbs on 09/04/16)  He still has a feeling of tightness in his legs but it has gone down. Feels abdominal distention is also slightly improved. Tube feeds were stopped in first week of January, thus weight loss partially reflective of this as well.  09/17/16: Na 140, K 3.6, Chloride 101, bicarb 30, BUN 37, creatinine 1.94.  SBP's at home primarily in 100 mmHg range.  Metolazone (after one dose) caused a drop in SBP to 90's so he wasn't given any more.   Social history: His daughter is a retired PA who worked for the [employer] for 20 years.   Review of Systems: As per "subjective", otherwise negative.  No Known Allergies  Current Outpatient Prescriptions  Medication Sig Dispense Refill  . aspirin EC 81 MG EC tablet Take 1 tablet (81 mg total) by mouth daily.    Marland Kitchen atorvastatin (LIPITOR) 40 MG tablet Take 1 tablet (40 mg total) by mouth daily. 90 tablet 3  . Cholecalciferol (VITAMIN D-3) 1000 units CAPS Take 1 capsule by mouth daily.     . Folic Acid-Vit B6-Vit B12 (FOLBEE) 2.5-25-1 MG TABS tablet Take 1 tablet by mouth daily.    Marland Kitchen glucose blood (ONETOUCH VERIO) test strip Use to test blood sugar 2 times daily as instructed. Dx: E11.59 200 each 3  . insulin NPH Human (HUMULIN N) 100 UNIT/ML injection Inject 6 units before b'fast and another 4 units at bedtime. PENS, PLEASE!! 15 mL 2  . Insulin Pen Needle (CAREFINE PEN NEEDLES) 32G X 4 MM MISC Use 2x a day 100 each 11  . latanoprost (XALATAN) 0.005 % ophthalmic solution Place 1 drop into both eyes at bedtime.    . lipase/protease/amylase (CREON) 36000 UNITS CPEP capsule Take 24,000 Units by mouth 3 (three) times daily before meals.    . Loperamide HCl  (IMODIUM PO) Take by mouth at bedtime. 1-2 Tablets @@ bed time    . metoprolol succinate (TOPROL-XL) 25 MG 24 hr tablet TAKE ONE TABLET BY MOUTH ONCE DAILY ,STOP  LOPRESSOR  (METOPROLOL  TARTRATE) 30 tablet 6  . Multiple Vitamins-Minerals (CENTRUM SILVER PO) Take 5 mLs by mouth daily.     . ondansetron (ZOFRAN) 4 MG tablet Take 4 mg by mouth every 8 (eight) hours as needed for nausea or vomiting.    Letta Pate DELICA LANCETS 33G MISC Use to test blood sugar 2 times daily. Dx: E11.59 200 each 3  . pantoprazole (PROTONIX) 40 MG tablet Take 40 mg by mouth 2 (two) times daily.    Marland Kitchen thiamine (VITAMIN B-1) 50 MG tablet Take 50 mg by mouth daily.     Marland Kitchen torsemide (DEMADEX) 20 MG tablet Take 1 tablet (20 mg total) by mouth 2 (two) times daily. 60 tablet 3  . vitamin C (ASCORBIC ACID) 500 MG tablet Take 500 mg by mouth daily.     . Zinc 50 MG CAPS Take 45 mg by mouth as directed.      No current facility-administered medications for this visit.     Past Medical History:  Diagnosis Date  . Cataract   . Cholelithiasis   . Diabetes mellitus type 2 in obese (HCC)   . DVT (deep venous thrombosis) (HCC)   . GERD (  gastroesophageal reflux disease)   . Hypertension   . MI (myocardial infarction) 01/2015  . Pancreatitis 01/2015    Past Surgical History:  Procedure Laterality Date  . CARDIAC CATHETERIZATION N/A 01/17/2015   Procedure: Left Heart Cath and Coronary Angiography;  Surgeon: Lyn Records, MD;  Location: [facility] INVASIVE CV LAB;  Service: Cardiovascular;  Laterality: N/A;  . CORONARY ARTERY BYPASS GRAFT N/A 02/14/2015   Procedure: CORONARY ARTERY BYPASS GRAFTING (CABG) x three, using left internal mammary artery and left leg greater saphenous vein harvested endoscopically;  Surgeon: Kerin Perna, MD;  Location: [facility] OR;  Service: Open Heart Surgery;  Laterality: N/A;  . ESOPHAGOGASTRODUODENOSCOPY N/A 01/21/2015   Procedure: ESOPHAGOGASTRODUODENOSCOPY (EGD);  Surgeon: Hilarie Fredrickson, MD;  Location: [facility]  ENDOSCOPY;  Service: Endoscopy;  Laterality: N/A;  to be done at BEDSIDE  . ESOPHAGOGASTRODUODENOSCOPY N/A 02/05/2015   Procedure: ESOPHAGOGASTRODUODENOSCOPY (EGD);  Surgeon: Hart Carwin, MD;  Location: [facility] ENDOSCOPY;  Service: Endoscopy;  Laterality: N/A;  . TEE WITHOUT CARDIOVERSION N/A 02/14/2015   Procedure: TRANSESOPHAGEAL ECHOCARDIOGRAM (TEE);  Surgeon: Kerin Perna, MD;  Location: [facility] OR;  Service: Open Heart Surgery;  Laterality: N/A;    Social History   Social History  . Marital status: Married    Spouse name: N/A  . Number of children: N/A  . Years of education: N/A   Occupational History  . Not on file.   Social History Main Topics  . Smoking status: Never Smoker  . Smokeless tobacco: Never Used  . Alcohol use No  . Drug use: No  . Sexual activity: Yes   Other Topics Concern  . Not on file   Social History Narrative  . No narrative on file     Vitals:   10/07/16 1352  BP: 114/68  Pulse: 64  SpO2: 98%  Weight: 147 lb (66.7 kg)  Height: 5\' 11"  (1.803 m)    PHYSICAL EXAM General: NAD HEENT: Normal. Neck: No JVD, no thyromegaly. Lungs: Clear to auscultation bilaterally with normal respiratory effort. CV: Nondisplaced PMI.  Regular rate and rhythm, normal S1/S2, no S3/S4, no murmur. 1+ pitting b/l pretibial edema. .   Abdomen: Mild distention. Neurologic: Alert and oriented.  Psych: Normal affect. Skin: Normal. Musculoskeletal: No gross deformities.    ECG: Most recent ECG reviewed.      ASSESSMENT AND PLAN: 1. CAD with 3-vessel CABG and non-STEMI: Symptomatically stable. Will continue aspirin, Lipitor, and metoprolol succinate 25 mg daily. No longer on ACEI.  2. Acute on chronic systolic heart failure/ischemic cardiomyopathy, EF 45%/bilateral leg edema: Will increase torsemide to 40 mg q am and 20 mg q pm. Check BMET 10/09/16.  3. DVT: On warfarin. IVC filter removed in 08/2015. No longer on anticoagulation.  4. Hyperlipidemia: On Lipitor  40 mg. No changes.  5. Essential HTN: Controlled. No changes. Monitor given increase in diuretic dose.  6. Bilateral carotid artery stenosis: Most recent results reviewed above. Repeat in July 2018. Continue ASA and statin.  Dispo: f/u 6 weeks   Prentice Docker, M.D., F.A.C.C.

## 2016-10-07 NOTE — Patient Instructions (Signed)
Medication Instructions:  INCREASE TORSEMIDE TO 40 MG IN THE A.M. & 20 MG IN THE P.M.  Labwork: Your physician recommends that you return for lab work in: Friday BMET   Testing/Procedures: NONE  Follow-Up: Your physician recommends that you schedule a follow-up appointment in: 6 WEEKS    Any Other Special Instructions Will Be Listed Below (If Applicable).     If you need a refill on your cardiac medications before your next appointment, please call your pharmacy.

## 2016-10-09 ENCOUNTER — Ambulatory Visit: Payer: Self-pay | Admitting: Cardiovascular Disease

## 2016-10-15 ENCOUNTER — Telehealth: Payer: Self-pay | Admitting: Cardiovascular Disease

## 2016-10-15 NOTE — Telephone Encounter (Signed)
Last week 100/60's   98/53 - Tuesday   90/52 - Wednesday Evening 97/47    Heart rate normally 50-60's   No symptoms, just having some low bp's.  Torsemide increased at last OV on 10/07/2016 with Dr. Purvis Sheffield.   Message sent to provider for further advice.

## 2016-10-15 NOTE — Telephone Encounter (Signed)
[name] [name] daughter Joaquin Courts) called in regards to concerns of patients low blood pressure and his medications.  Please call [phone]

## 2016-10-15 NOTE — Telephone Encounter (Signed)
Patient of Dr. Purvis Sheffield. I reviewed the most recent office note. Suggest cutting Demadex back to the previous dose and continue Toprol-XL for now. Continue to monitor blood pressure as before.

## 2016-10-16 MED ORDER — TORSEMIDE 20 MG PO TABS: 20.0000 mg | ORAL_TABLET | Freq: Two times a day (BID) | ORAL | Status: DC

## 2016-10-16 NOTE — Telephone Encounter (Signed)
[name] (daughter) notified via voice mail.

## 2016-10-20 ENCOUNTER — Telehealth: Payer: Self-pay | Admitting: *Deleted

## 2016-10-20 NOTE — Telephone Encounter (Signed)
Notes Recorded by Lesle Chris, LPN on 1/63/8466 at 11:43 AM EST Patient notified. Copy to pmd. ------  Notes Recorded by Laqueta Linden, MD on 10/19/2016 at 4:43 PM EST CKD is stable. Should follow with nephrology.

## 2016-10-30 ENCOUNTER — Telehealth: Payer: Self-pay | Admitting: Cardiovascular Disease

## 2016-10-30 NOTE — Telephone Encounter (Signed)
What is his weight?

## 2016-10-30 NOTE — Telephone Encounter (Signed)
Called asking about his water pill and metroplol dosage.   2/21 had his physical and PCP discussed increasing due to water gain.   His BP has been low and they would like to know how he should take it.    104/56 this morning when it was taken

## 2016-10-30 NOTE — Telephone Encounter (Signed)
Daughter Carmelina Noun) notified.  Follow up OV already scheduled for 11/13/2016.

## 2016-10-30 NOTE — Telephone Encounter (Signed)
Please advise 

## 2016-10-30 NOTE — Telephone Encounter (Signed)
Given history of CABG, NSTEMI, and cardiomyopathy, beta blocker is important. Can cut dose in half and take daily. Given weight gain, I would recommend taking torsemide 20 mg bid x 3 days and then go back to 20 mg daily.

## 2016-10-30 NOTE — Telephone Encounter (Signed)
Returned call to daughter [place]).  Weight yesterday was 153lbs.  Gain was gradual.  Torsemide - one 20mg  yesterday (did not do second dose) & has not take any this morning.  104/56 this morning.  No c/o chest pain, SOB , or dizziness.  Family is holding the Torsemide based off of his low bp readings & when they feel he is dehydrated.  Also, stated he has continued to take the Metoprolol daily & questions if he is to continue this as well.

## 2016-11-02 ENCOUNTER — Encounter: Payer: Self-pay | Admitting: Internal Medicine

## 2016-11-02 ENCOUNTER — Telehealth: Payer: Self-pay | Admitting: Internal Medicine

## 2016-11-02 ENCOUNTER — Ambulatory Visit (INDEPENDENT_AMBULATORY_CARE_PROVIDER_SITE_OTHER): Payer: Medicare Other | Admitting: Internal Medicine

## 2016-11-02 VITALS — BP 128/68 | HR 61 | Ht 71.0 in | Wt 153.0 lb

## 2016-11-02 DIAGNOSIS — E1159 Type 2 diabetes mellitus with other circulatory complications: Secondary | ICD-10-CM

## 2016-11-02 DIAGNOSIS — E1165 Type 2 diabetes mellitus with hyperglycemia: Secondary | ICD-10-CM | POA: Diagnosis not present

## 2016-11-02 LAB — POCT GLYCOSYLATED HEMOGLOBIN (HGB A1C): Hemoglobin A1C: 6.9

## 2016-11-02 NOTE — Addendum Note (Signed)
Addended by: Darene Lamer T on: 11/02/2016 10:49 AM   Modules accepted: Orders

## 2016-11-02 NOTE — Telephone Encounter (Signed)
Called and spoke with [name], they are faxing labs over regarding his thyroid for Dr.Gherghe to look over.

## 2016-11-02 NOTE — Progress Notes (Signed)
Patient ID: [name] [name], male   DOB: [birth year], 76 y.o.   MRN: [id number]  HPI: [name] [name] is a 76 y.o.-year-old male, initially referred by his gastroenterologist, Dr Leone Payor, returning for follow-up for DM2, dx in ~2006, insulin-dependent, uncontrolled, with complications (CAD, s/p AMI, s/p CABG; CKD). Last visit 3 mo ago.   Pt is now off TFs. Lost overall 7 lbs since last visit, but he has fluctuating fluid status.   He is still seeing wound care  - had a stage 4 buttock ulcer >> had debridement.  Reviewed hx: Patient was admitted in 01/2015 with acute biliary pancreatitis secondary to gallstones and he had a very complicated hospitalization. He had an MI and had to have 3 vessel CABG during that hospitalization. He also developed esophagitis with GI bleeding. He is now slowly improving and continues to see Dr. Leone Payor for his pancreatitis. On the latest CT scan, there was a peripancreatic fluid collection, ? Necrosis. He has diarrhea >> started Creon >> a little better.   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  He had cholecystectomy in 07/2015 and had 3 ERCPs and had the IVC filter extracted.  Last hemoglobin A1c was: Lab Results  Component Value Date   HGBA1C 6.8 (H) 08/06/2016   HGBA1C 5.9 04/06/2016   HGBA1C 5.6 12/12/2015   Pt was on a regimen of: - Metformin 500 mg 2x a day, with meals (losing weight on a higher dose) - Lantus 8 >> 10 units in am - Humalog SSI units 3x a day, before meals: 201-250: + 2 units 251-300: + 3 units > 300: + 4 units He was on Glipizide before the pancreatitis. He was on Januvia.  While on tube feeds, we changed to NPH insulin: - NPH 6 units in am and 6 >> 4 units at bedtime (decreased the dose  07/2016)  Pt checks his sugars 2x a day and they are: - 6 am: 200-210 >> 154-266 >> 82-175 (most 120s-150s) >> 93-147, 155, 175 >> n/c - after OJ: 109, 119-178 - after b'fast: 101-196 - before lunch: 94, 105 >> n/c >> 205 (after snack) - after lunch:  111-167 >> n/c - 4 pm (before feeds): 79-177 >> before dinner: 85-172, 190 >> n/c  - 9 pm (bedtime): 94, 192- >> 74-194 >> 111, 120-187, 195 >> 161-200 No lows. Lowest sugar was 70 (on Glipizide) >> 80s >> 90 >> 79 >> 109; he has hypoglycemia awareness at 70.  Highest sugar was 270s >> 271 >> 290 >> 266 >> 200.  Before tube feeds, Pt's meals were: - Breakfast: cereal + strawberries (Special K) + oatmeal; toast + eggs; scrambled eggs; cheese toast - Lunch: potatoes, casseroles, chicken - Dinner: sandwich or soup, used to eat out - Snacks: yoghurt, cheese; no snacks at night  - + CKD, last BUN/creatinine:  10/12/2016: 29/1.88, GFR 34 09/24/2016: 37/1.94, GFR 34, albumin 2.5, AST/ALT 161/146 07/07/2016: 32/1.53, GFR 45, albumin 2.5, AST/ALT 173/150 Lab Results  Component Value Date   BUN 15 08/26/2015   CREATININE 0.93 08/26/2015  He is on lisinopril. - last set of lipids: Lab Results  Component Value Date   CHOL 104 01/15/2015   HDL 26 (L) 01/15/2015   LDLCALC 64 01/15/2015   TRIG 195 (H)  01/19/2015   CHOLHDL 4.0 01/15/2015  He is on Lipitor. - last eye exam was in 04/2015. No DR.  - no numbness and tingling in his feet.  ROS: Constitutional: + weight loss, then gain, no fatigue, no subjective hyperthermia/hypothermia Eyes: no blurry vision, no xerophthalmia ENT: no sore throat, no nodules palpated in throat, no dysphagia/odynophagia, no hoarseness Cardiovascular: no CP/SOB/palpitations/leg swelling Respiratory: no cough/ no SOB Gastrointestinal: no N/V/D/C Musculoskeletal: no muscle/joint aches Skin: no rashes Neurological: no tremors/numbness/tingling/dizziness  I reviewed pt's medications, allergies, PMH, social hx,  family hx, and changes were documented in the history of present illness. Otherwise, unchanged from my initial visit note.  Past Medical History:  Diagnosis Date  . Cataract   . Cholelithiasis   . Diabetes mellitus type 2 in obese (HCC)   . DVT (deep venous thrombosis) (HCC)   . GERD (gastroesophageal reflux disease)   . Hypertension   . MI (myocardial infarction) 01/2015  . Pancreatitis 01/2015   Past Surgical History:  Procedure Laterality Date  . CARDIAC CATHETERIZATION N/A 01/17/2015   Procedure: Left Heart Cath and Coronary Angiography;  Surgeon: Lyn Records, MD;  Location: [facility] INVASIVE CV LAB;  Service: Cardiovascular;  Laterality: N/A;  . CORONARY ARTERY BYPASS GRAFT N/A 02/14/2015   Procedure: CORONARY ARTERY BYPASS GRAFTING (CABG) x three, using left internal mammary artery and left leg greater saphenous vein harvested endoscopically;  Surgeon: Kerin Perna, MD;  Location: [facility] OR;  Service: Open Heart Surgery;  Laterality: N/A;  . ESOPHAGOGASTRODUODENOSCOPY N/A 01/21/2015   Procedure: ESOPHAGOGASTRODUODENOSCOPY (EGD);  Surgeon: Hilarie Fredrickson, MD;  Location: [facility] ENDOSCOPY;  Service: Endoscopy;  Laterality: N/A;  to be done at BEDSIDE  . ESOPHAGOGASTRODUODENOSCOPY N/A 02/05/2015   Procedure: ESOPHAGOGASTRODUODENOSCOPY (EGD);  Surgeon: Hart Carwin, MD;  Location: [facility] ENDOSCOPY;  Service: Endoscopy;  Laterality: N/A;  . TEE WITHOUT CARDIOVERSION N/A 02/14/2015   Procedure: TRANSESOPHAGEAL ECHOCARDIOGRAM (TEE);  Surgeon: Kerin Perna, MD;  Location: [facility] OR;  Service: Open Heart Surgery;  Laterality: N/A;   Social History   Social History  . Marital Status: Married    Spouse Name: N/A  . Number of Children: N/A   Social History Main Topics  . Smoking status: Never Smoker   . Smokeless tobacco: Never Used  . Alcohol Use: No  . Drug Use: No   Current Outpatient Prescriptions on File Prior to Visit  Medication Sig Dispense Refill  . aspirin EC 81 MG EC tablet Take 1 tablet (81 mg  total) by mouth daily.    Marland Kitchen atorvastatin (LIPITOR) 40 MG tablet Take 1 tablet (40 mg total) by mouth daily. 90 tablet 3  . Cholecalciferol (VITAMIN D-3) 1000 units CAPS Take 1 capsule by mouth daily.     . Folic Acid-Vit B6-Vit B12 (FOLBEE) 2.5-25-1 MG TABS tablet Take 1 tablet by mouth daily.    Marland Kitchen glucose blood (ONETOUCH VERIO) test strip Use to test blood sugar 2 times daily as instructed. Dx: E11.59 200 each 3  . insulin NPH Human (HUMULIN N) 100 UNIT/ML injection Inject 6 units before b'fast and another 4 units at bedtime. PENS, PLEASE!! 15 mL 2  . Insulin Pen Needle (CAREFINE PEN NEEDLES) 32G X 4 MM MISC Use 2x a day 100 each 11  . latanoprost (XALATAN) 0.005 % ophthalmic solution Place 1 drop into both eyes at bedtime.    . lipase/protease/amylase (CREON) 36000 UNITS CPEP capsule Take 24,000 Units by mouth 3 (three) times daily before meals.    Marland Kitchen  Loperamide HCl (IMODIUM PO) Take by mouth at bedtime. 1-2 Tablets @@ bed time    . metoprolol succinate (TOPROL-XL) 25 MG 24 hr tablet TAKE ONE TABLET BY MOUTH ONCE DAILY ,STOP  LOPRESSOR  (METOPROLOL  TARTRATE) 30 tablet 6  . Multiple Vitamins-Minerals (CENTRUM SILVER PO) Take 5 mLs by mouth daily.     . ondansetron (ZOFRAN) 4 MG tablet Take 4 mg by mouth every 8 (eight) hours as needed for nausea or vomiting.    Letta Pate DELICA LANCETS 33G MISC Use to test blood sugar 2 times daily. Dx: E11.59 200 each 3  . pantoprazole (PROTONIX) 40 MG tablet Take 40 mg by mouth 2 (two) times daily.    Marland Kitchen thiamine (VITAMIN B-1) 50 MG tablet Take 50 mg by mouth daily.     Marland Kitchen torsemide (DEMADEX) 20 MG tablet Take 1 tablet (20 mg total) by mouth 2 (two) times daily.    . vitamin C (ASCORBIC ACID) 500 MG tablet Take 500 mg by mouth daily.     . Zinc 50 MG CAPS Take 45 mg by mouth as directed.      No current facility-administered medications on file prior to visit.    No Known Allergies Family History  Problem Relation Age of Onset  . Heart attack Brother 55   . Pancreatic cancer Sister 70    died in her 55s.    PE: BP 128/68 (BP Location: Left Arm, Patient Position: Sitting)   Pulse 61   Ht 5\' 11"  (1.803 m)   Wt 153 lb (69.4 kg)   SpO2 98%   BMI 21.34 kg/m  Body mass index is 21.34 kg/m.  Wt Readings from Last 3 Encounters:  11/02/16 153 lb (69.4 kg)  10/07/16 147 lb (66.7 kg)  09/04/16 162 lb (73.5 kg)   Constitutional: thin, protruberant abdomen, in NAD Eyes: PERRLA, EOMI, no exophthalmos ENT: moist mucous membranes, no thyromegaly, no cervical lymphadenopathy Cardiovascular: RRR, No MRG, no LE edema  Respiratory: CTA B Gastrointestinal: abdomen soft, NT, ND, BS+ Musculoskeletal: no deformities, strength intact in all 4 Skin: dry, warm, no rashes Neurological: no tremor with outstretched hands, DTR normal in all 4  ASSESSMENT: 1. DM2, insulin-dependent, uncontrolled, with complications - CAD, s/p AMI, s/p 3v CABG - CKD  PLAN:  1. Patient with insulin-dependent diabetes, likely due to pancreatitis. He was on TF's, now off for ~ 2 mo >> we reduced NPH dose at night at last visit. He is using 4 units in HS but increases to 6 units if sugars are higher. This is OK, but in am he checks high sugars after OJ 4 z >> advised to check fasting to see if he has lows. He does not c/o feeling hypoglycemic. - Last was 6.8% (great!). HbA1c today 6.9%.  - I suggested to:  Patient Instructions  Please continue NPH  6 units in am and 4-6 units at bedtime.  Check some sugars before lunch and dinner.  Let me know if sugars stay <100 or >190s.  Please return in 3 months with your sugar log.   - continue checking sugars at different times of the day - needs a new eye exam - Return to clinic in 3 mo with sugar log   Carlus Pavlov, MD PhD [facility] Endocrinology

## 2016-11-02 NOTE — Telephone Encounter (Signed)
[name] [name] calling with questions about thyroid, please call her back [phone]

## 2016-11-02 NOTE — Patient Instructions (Addendum)
Please continue NPH  6 units in am and 4-6 units at bedtime.  Check some sugars before lunch and dinner.  Let me know if sugars stay <100 or >190s.  Please return in 3 months with your sugar log.

## 2016-11-04 ENCOUNTER — Telehealth: Payer: Self-pay

## 2016-11-04 ENCOUNTER — Encounter: Payer: Self-pay | Admitting: Internal Medicine

## 2016-11-04 DIAGNOSIS — E039 Hypothyroidism, unspecified: Secondary | ICD-10-CM

## 2016-11-04 MED ORDER — LEVOTHYROXINE SODIUM 50 MCG PO TABS: 50.0000 ug | ORAL_TABLET | Freq: Every day | ORAL | 1 refills | Status: DC

## 2016-11-04 NOTE — Telephone Encounter (Signed)
Called patient daughter and left message for her to call back regarding patients lab results that was submitted form PCP, I gave call back number.

## 2016-11-04 NOTE — Progress Notes (Signed)
Received labs from PCP, drawn on 10/29/2016: - Glucose 123, BUN/creatinine 23/1.76, eGFR 37. Potassium was low at 3.4, calcium was low at 7.4, albumin was low at 2.5, AST and ALT were high at 87/63. Alkaline phosphatase was normal at 67. - However, patient was found to be profoundly hypothyroid, with a TSH of 105.5  Will advise the patient to start levothyroxine 50 g daily and increase to 75 g in 2 weeks. I would him to come back in 8 weeks to recheck his labs. He will definitely need more levothyroxine at that point, but we cannot start at the higher dose.  Will advise the patient to take the levothyroxine every day, with water, at least 30 minutes before breakfast, separated by at least 4 hours from: - acid reflux medications - calcium - iron - multivitamins

## 2016-11-04 NOTE — Telephone Encounter (Signed)
-----   Message from Carlus Pavlov, MD sent at 11/04/2016  1:48 PM EST ----- Raynelle Fanning,  The labs drawn by PCP on 10/29/2016 showed that he is profoundly hypothyroid, with a TSH of 105.5  He will need to start levothyroxine 50 g daily and increase to 75 g in 2 weeks (1.5 tablets). Please advise him to come back in 8 weeks to recheck his labs (6 weeks after the increase in dose). He will definitely need more levothyroxine at that point, but we cannot start at a higher dose.  I sent the dose of [facility]othyroxine to [facility] and ordered to his labs.  Please make him write down the following: take the levothyroxine every day, with water, at least 30 minutes before breakfast, separated by at least 4 hours from: - acid reflux medications - calcium - iron - multivitamins  Thank you, C

## 2016-11-10 ENCOUNTER — Ambulatory Visit (INDEPENDENT_AMBULATORY_CARE_PROVIDER_SITE_OTHER): Payer: Medicare Other | Admitting: Cardiovascular Disease

## 2016-11-10 ENCOUNTER — Encounter: Payer: Self-pay | Admitting: Cardiovascular Disease

## 2016-11-10 VITALS — BP 124/68 | HR 64 | Ht 73.0 in | Wt 158.0 lb

## 2016-11-10 DIAGNOSIS — Z951 Presence of aortocoronary bypass graft: Secondary | ICD-10-CM

## 2016-11-10 DIAGNOSIS — R600 Localized edema: Secondary | ICD-10-CM | POA: Diagnosis not present

## 2016-11-10 DIAGNOSIS — E038 Other specified hypothyroidism: Secondary | ICD-10-CM

## 2016-11-10 DIAGNOSIS — E7800 Pure hypercholesterolemia, unspecified: Secondary | ICD-10-CM | POA: Diagnosis not present

## 2016-11-10 DIAGNOSIS — I252 Old myocardial infarction: Secondary | ICD-10-CM

## 2016-11-10 DIAGNOSIS — I25810 Atherosclerosis of coronary artery bypass graft(s) without angina pectoris: Secondary | ICD-10-CM | POA: Diagnosis not present

## 2016-11-10 DIAGNOSIS — I10 Essential (primary) hypertension: Secondary | ICD-10-CM | POA: Diagnosis not present

## 2016-11-10 DIAGNOSIS — E039 Hypothyroidism, unspecified: Secondary | ICD-10-CM

## 2016-11-10 DIAGNOSIS — I6523 Occlusion and stenosis of bilateral carotid arteries: Secondary | ICD-10-CM | POA: Diagnosis not present

## 2016-11-10 DIAGNOSIS — Z79899 Other long term (current) drug therapy: Secondary | ICD-10-CM

## 2016-11-10 DIAGNOSIS — I5023 Acute on chronic systolic (congestive) heart failure: Secondary | ICD-10-CM

## 2016-11-10 NOTE — Progress Notes (Signed)
SUBJECTIVE: The patient presents for follow-up of chronic systolic heart failure. He has a history of CABG, non-STEMI, and DVT. Carotid Dopplers 03/19/16 40-59% right internal carotid artery and 60-79% left internal carotid artery stenosis.  He developed a radical weight gain and I increased torsemide to 20 mg twice daily for 3 days.  Wt 158 lbs (147 lbs on 10/07/16).  10/09/16: Na 142, K 3.5, BUN 29, creatinine 1.88.  Metolazone causes drops in BP so he doesn't take them.  He had progressively been getting weaker and had an appointment with his PCP. TSH was markedly elevated at 105 on 10/28/16.   Albumin was low at 2.5.   Other labs on 2/22: Total cholesterol 45, trig glycerides 56, HDL 26, LDL 8, hemoglobin 8.9, platelet 109, BUN 23, creatinine 1.76, sodium 144, potassium 3.4.   He has been on levothyroxine 50 g for the past week.   Social history: His daughter, Harvest Forest, is a retired PA who worked for the [employer] for 20 years.    Review of Systems: As per "subjective", otherwise negative.  No Known Allergies  Current Outpatient Prescriptions  Medication Sig Dispense Refill  . aspirin EC 81 MG EC tablet Take 1 tablet (81 mg total) by mouth daily.    Marland Kitchen atorvastatin (LIPITOR) 40 MG tablet Take 1 tablet (40 mg total) by mouth daily. 90 tablet 3  . Cholecalciferol (VITAMIN D-3) 1000 units CAPS Take 1 capsule by mouth daily.     . Folic Acid-Vit B6-Vit B12 (FOLBEE) 2.5-25-1 MG TABS tablet Take 1 tablet by mouth daily.    Marland Kitchen glucose blood (ONETOUCH VERIO) test strip Use to test blood sugar 2 times daily as instructed. Dx: E11.59 200 each 3  . insulin NPH Human (HUMULIN N) 100 UNIT/ML injection Inject 6 units before b'fast and another 4 units at bedtime. PENS, PLEASE!! 15 mL 2  . Insulin Pen Needle (CAREFINE PEN NEEDLES) 32G X 4 MM MISC Use 2x a day 100 each 11  . latanoprost (XALATAN) 0.005 % ophthalmic solution Place 1 drop into both eyes at  bedtime.    Marland Kitchen levothyroxine (SYNTHROID, LEVOTHROID) 50 MCG tablet Take 1 tablet (50 mcg total) by mouth daily before breakfast. 90 tablet 1  . lipase/protease/amylase (CREON) 36000 UNITS CPEP capsule Take 24,000 Units by mouth 3 (three) times daily before meals.    . Loperamide HCl (IMODIUM PO) Take by mouth at bedtime. 1-2 Tablets @@ bed time    . metoprolol succinate (TOPROL-XL) 25 MG 24 hr tablet TAKE ONE TABLET BY MOUTH ONCE DAILY ,STOP  LOPRESSOR  (METOPROLOL  TARTRATE) 30 tablet 6  . Multiple Vitamins-Minerals (CENTRUM SILVER PO) Take 5 mLs by mouth daily.     . ondansetron (ZOFRAN) 4 MG tablet Take 4 mg by mouth every 8 (eight) hours as needed for nausea or vomiting.    Letta Pate DELICA LANCETS 33G MISC Use to test blood sugar 2 times daily. Dx: E11.59 200 each 3  . pantoprazole (PROTONIX) 40 MG tablet Take 40 mg by mouth 2 (two) times daily.    Marland Kitchen thiamine (VITAMIN B-1) 50 MG tablet Take 50 mg by mouth daily.     Marland Kitchen torsemide (DEMADEX) 20 MG tablet Take 20 mg by mouth daily.    . vitamin C (ASCORBIC ACID) 500 MG tablet Take 500 mg by mouth daily.     . Zinc 50 MG CAPS Take 45 mg by mouth as directed.  No current facility-administered medications for this visit.     Past Medical History:  Diagnosis Date  . Cataract   . Cholelithiasis   . Diabetes mellitus type 2 in obese (HCC)   . DVT (deep venous thrombosis) (HCC)   . GERD (gastroesophageal reflux disease)   . Hypertension   . MI (myocardial infarction) 01/2015  . Pancreatitis 01/2015    Past Surgical History:  Procedure Laterality Date  . CARDIAC CATHETERIZATION N/A 01/17/2015   Procedure: Left Heart Cath and Coronary Angiography;  Surgeon: Lyn Records, MD;  Location: [facility] INVASIVE CV LAB;  Service: Cardiovascular;  Laterality: N/A;  . CORONARY ARTERY BYPASS GRAFT N/A 02/14/2015   Procedure: CORONARY ARTERY BYPASS GRAFTING (CABG) x three, using left internal mammary artery and left leg greater saphenous vein harvested  endoscopically;  Surgeon: Kerin Perna, MD;  Location: [facility] OR;  Service: Open Heart Surgery;  Laterality: N/A;  . ESOPHAGOGASTRODUODENOSCOPY N/A 01/21/2015   Procedure: ESOPHAGOGASTRODUODENOSCOPY (EGD);  Surgeon: Hilarie Fredrickson, MD;  Location: [facility] ENDOSCOPY;  Service: Endoscopy;  Laterality: N/A;  to be done at BEDSIDE  . ESOPHAGOGASTRODUODENOSCOPY N/A 02/05/2015   Procedure: ESOPHAGOGASTRODUODENOSCOPY (EGD);  Surgeon: Hart Carwin, MD;  Location: [facility] ENDOSCOPY;  Service: Endoscopy;  Laterality: N/A;  . TEE WITHOUT CARDIOVERSION N/A 02/14/2015   Procedure: TRANSESOPHAGEAL ECHOCARDIOGRAM (TEE);  Surgeon: Kerin Perna, MD;  Location: [facility] OR;  Service: Open Heart Surgery;  Laterality: N/A;    Social History   Social History  . Marital status: Married    Spouse name: N/A  . Number of children: N/A  . Years of education: N/A   Occupational History  . Not on file.   Social History Main Topics  . Smoking status: Never Smoker  . Smokeless tobacco: Never Used  . Alcohol use No  . Drug use: No  . Sexual activity: Yes   Other Topics Concern  . Not on file   Social History Narrative  . No narrative on file     Vitals:   11/10/16 1523  BP: 124/68  Pulse: 64  SpO2: 98%  Weight: 158 lb (71.7 kg)  Height: 6\' 1"  (1.854 m)    PHYSICAL EXAM General: NAD HEENT: Normal. Neck: No JVD, no thyromegaly. Lungs: Clear to auscultation bilaterally with normal respiratory effort. CV: Nondisplaced PMI.  Regular rate and rhythm, normal S1/S2, no S3/S4, no murmur. 1+ pitting b/l pretibial edema.   Abdomen: Milddistention. Neurologic: Alert and oriented.  Psych: Normal affect. Skin: Normal. Musculoskeletal: No gross deformities.    ECG: Most recent ECG reviewed.      ASSESSMENT AND PLAN:  1. CAD with 3-vessel CABG and non-STEMI: Symptomatically stable. Will continue aspirin and Lipitor. I will stop metoprolol succinate for now given profound hypothyroidism which may lead to bradycardia.  I may reinstitute at a later date.  2. Acute on chronic systolic heart failure/ischemic cardiomyopathy, EF 45%/bilateral leg edema: This is being driven by severe hypothyroidism. He has been on replacement therapy for one week. I will continue torsemide 20 mg daily for now and stop metoprolol succinate.  3. DVT: On warfarin. IVC filter removed in 08/2015. No longer on anticoagulation.  4. Hyperlipidemia: On Lipitor 40 mg. No changes.  5. Essential HTN: Low normal. Will stop Toprol-XL.  6. Bilateral carotid artery stenosis: Most recent results reviewed above. Repeat in July 2018. Continue ASA and statin.  7. Severe hypothyroidism: TSH 105 on 10/28/16. On 50 mcg daily of levothyroxine. Due to be repeated in 7 weeks.  Dispo: f/u 3 months.   Prentice Docker, M.D., F.A.C.C.

## 2016-11-10 NOTE — Patient Instructions (Signed)
Medication Instructions:   Stop Toprol XL (Metoprolol Succ)  Continue all other medications.    Labwork: none  Testing/Procedures: none  Follow-Up: 3 months   Any Other Special Instructions Will Be Listed Below (If Applicable).  If you need a refill on your cardiac medications before your next appointment, please call your pharmacy.

## 2016-11-12 ENCOUNTER — Telehealth: Payer: Self-pay

## 2016-11-12 NOTE — Telephone Encounter (Signed)
It is possibly thyroid related. Please stay very well-hydrated. Also, I would not check the testosterone until his thyroid is back to normal.

## 2016-11-12 NOTE — Telephone Encounter (Signed)
Called patient to make sure they had received all the instructions from Dr.Gherghe regarding the PCP lab work request, they said they had but had been to see a wound doctor as patient has a pressure ulcer on sacral area. Doctor over there states it could be from his Testosterone levels as to why he is not healing appropriately, patients daughters would like to know if you would order these or if they need to go to PCP first. Please advise. Thank you!

## 2016-11-12 NOTE — Telephone Encounter (Signed)
Called patient daughter. No other questions at this time. Daughter also states his BP has been running low and did not know if this was thyroid related. Thank you!

## 2016-11-12 NOTE — Telephone Encounter (Signed)
I will let his PCP check this. It is important, when he has this checked, to go to the lab at 8 AM, fasting.

## 2016-11-13 ENCOUNTER — Telehealth: Payer: Self-pay

## 2016-11-13 ENCOUNTER — Ambulatory Visit: Payer: Self-pay | Admitting: Cardiovascular Disease

## 2016-11-13 NOTE — Telephone Encounter (Signed)
Called and LVM for patient advising of Dr.Gherghe's note.Left call back number if any questions.

## 2016-11-13 NOTE — Telephone Encounter (Signed)
Called patient daughter and LVM advising of the message from Dr.Gherghe. Gave call back number for questions.

## 2016-11-19 ENCOUNTER — Telehealth: Payer: Self-pay | Admitting: Cardiovascular Disease

## 2016-11-19 DIAGNOSIS — I5021 Acute systolic (congestive) heart failure: Secondary | ICD-10-CM

## 2016-11-19 DIAGNOSIS — I255 Ischemic cardiomyopathy: Secondary | ICD-10-CM

## 2016-11-19 NOTE — Telephone Encounter (Signed)
Take an extra torsemide 20 mg x 2 days. I would like him seen in the advanced HF clinic as well.

## 2016-11-19 NOTE — Telephone Encounter (Signed)
Per daughter, patient was seen at the wound center at [facility] today and was advised to contact his cardiologist about the increased swelling he is having since his last visit with cardiologist. Weight has increased some from 158.4 lbs 2 days ago and is 160.6 lbs today. No c/o sob, dizziness, n/v, or chest pain. BP has been stable per daughter. Confirmed with daughter that patient is currently taking torsemide 20 mg daily. Daughter advised that message would be sent to his provider for advise.

## 2016-11-19 NOTE — Telephone Encounter (Signed)
Carmelina Noun -daughter called stating that her father is at [facility] Very concerned about the swelling in his legs.  Have questions about medications. Please call Lorrie [phone].

## 2016-11-19 NOTE — Telephone Encounter (Signed)
Daughter informed and verbalized understanding of plan. 

## 2016-11-25 ENCOUNTER — Telehealth: Payer: Self-pay | Admitting: Cardiovascular Disease

## 2016-11-25 NOTE — Telephone Encounter (Signed)
Pt daughter aware

## 2016-11-25 NOTE — Telephone Encounter (Signed)
Yes, take an extra 20 mg daily for the next 5 days.

## 2016-11-25 NOTE — Telephone Encounter (Signed)
Pt denies SOB/dizziness/chest pain - says weight was at 162lbs yesterday today was 164.2 did take extra torsemide Thursday and Friday last week. Has appt with heart failure clinic next week and wanted to know if should increase torsemide until that appt with weight gain

## 2016-11-25 NOTE — Telephone Encounter (Signed)
Lorrie (daughter) called stating that Mr. Beahan weight is still up.  Weight today 11/25/16 164.2  BP 106/54. Does patient need to make an increase in his medication. Please call [phone] to speak with daughter.

## 2016-11-29 ENCOUNTER — Encounter (HOSPITAL_COMMUNITY): Payer: Self-pay | Admitting: Emergency Medicine

## 2016-11-29 ENCOUNTER — Emergency Department (HOSPITAL_COMMUNITY)
Admission: EM | Admit: 2016-11-29 | Discharge: 2016-11-29 | Disposition: A | Discharge status: Home / Self Care | Payer: Medicare Other | Attending: Emergency Medicine | Admitting: Emergency Medicine

## 2016-11-29 ENCOUNTER — Telehealth: Payer: Self-pay | Admitting: Physician Assistant

## 2016-11-29 DIAGNOSIS — I110 Hypertensive heart disease with heart failure: Secondary | ICD-10-CM | POA: Diagnosis not present

## 2016-11-29 DIAGNOSIS — Z7982 Long term (current) use of aspirin: Secondary | ICD-10-CM | POA: Insufficient documentation

## 2016-11-29 DIAGNOSIS — E119 Type 2 diabetes mellitus without complications: Secondary | ICD-10-CM | POA: Diagnosis not present

## 2016-11-29 DIAGNOSIS — R601 Generalized edema: Secondary | ICD-10-CM | POA: Insufficient documentation

## 2016-11-29 DIAGNOSIS — Z951 Presence of aortocoronary bypass graft: Secondary | ICD-10-CM | POA: Insufficient documentation

## 2016-11-29 DIAGNOSIS — I2510 Atherosclerotic heart disease of native coronary artery without angina pectoris: Secondary | ICD-10-CM | POA: Diagnosis not present

## 2016-11-29 DIAGNOSIS — Z79899 Other long term (current) drug therapy: Secondary | ICD-10-CM | POA: Diagnosis not present

## 2016-11-29 DIAGNOSIS — Z794 Long term (current) use of insulin: Secondary | ICD-10-CM | POA: Diagnosis not present

## 2016-11-29 DIAGNOSIS — I509 Heart failure, unspecified: Secondary | ICD-10-CM | POA: Diagnosis not present

## 2016-11-29 HISTORY — DX: Pressure ulcer of unspecified site, unspecified stage: L89.90

## 2016-11-29 LAB — TSH: TSH: 64.398 u[IU]/mL — ABNORMAL HIGH (ref 0.350–4.500)

## 2016-11-29 LAB — COMPREHENSIVE METABOLIC PANEL
ALT: 65 U/L — ABNORMAL HIGH (ref 17–63)
AST: 86 U/L — ABNORMAL HIGH (ref 15–41)
Albumin: 2.1 g/dL — ABNORMAL LOW (ref 3.5–5.0)
Alkaline Phosphatase: 57 U/L (ref 38–126)
Anion gap: 8 (ref 5–15)
BUN: 30 mg/dL — ABNORMAL HIGH (ref 6–20)
CO2: 24 mmol/L (ref 22–32)
Calcium: 7.5 mg/dL — ABNORMAL LOW (ref 8.9–10.3)
Chloride: 109 mmol/L (ref 101–111)
Creatinine, Ser: 1.71 mg/dL — ABNORMAL HIGH (ref 0.61–1.24)
GFR calc Af Amer: 44 mL/min — ABNORMAL LOW (ref >60)
GFR calc non Af Amer: 38 mL/min — ABNORMAL LOW (ref >60)
Glucose, Bld: 149 mg/dL — ABNORMAL HIGH (ref 65–99)
Potassium: 2.9 mmol/L — ABNORMAL LOW (ref 3.5–5.1)
Sodium: 141 mmol/L (ref 135–145)
Total Bilirubin: 0.8 mg/dL (ref 0.3–1.2)
Total Protein: 7.4 g/dL (ref 6.5–8.1)

## 2016-11-29 LAB — CBC WITH DIFFERENTIAL/PLATELET
Basophils Absolute: 0 10*3/uL (ref 0.0–0.1)
Basophils Relative: 0 %
Eosinophils Absolute: 1.1 10*3/uL — ABNORMAL HIGH (ref 0.0–0.7)
Eosinophils Relative: 15 %
HCT: 28 % — ABNORMAL LOW (ref 39.0–52.0)
Hemoglobin: 9.4 g/dL — ABNORMAL LOW (ref 13.0–17.0)
Lymphocytes Relative: 20 %
Lymphs Abs: 1.4 10*3/uL (ref 0.7–4.0)
MCH: 33.3 pg (ref 26.0–34.0)
MCHC: 33.6 g/dL (ref 30.0–36.0)
MCV: 99.3 fL (ref 78.0–100.0)
Monocytes Absolute: 0.4 10*3/uL (ref 0.1–1.0)
Monocytes Relative: 5 %
Neutro Abs: 4.1 10*3/uL (ref 1.7–7.7)
Neutrophils Relative %: 60 %
Platelets: 122 10*3/uL — ABNORMAL LOW (ref 150–400)
RBC: 2.82 MIL/uL — ABNORMAL LOW (ref 4.22–5.81)
RDW: 16.4 % — ABNORMAL HIGH (ref 11.5–15.5)
WBC: 7 10*3/uL (ref 4.0–10.5)

## 2016-11-29 LAB — T4, FREE: Free T4: 0.82 ng/dL (ref 0.61–1.12)

## 2016-11-29 LAB — URINALYSIS, ROUTINE W REFLEX MICROSCOPIC
Bacteria, UA: NONE SEEN
Bilirubin Urine: NEGATIVE
Glucose, UA: NEGATIVE mg/dL
Hgb urine dipstick: NEGATIVE
Ketones, ur: NEGATIVE mg/dL
Nitrite: NEGATIVE
Protein, ur: NEGATIVE mg/dL
Specific Gravity, Urine: 1.01 (ref 1.005–1.030)
pH: 5 (ref 5.0–8.0)

## 2016-11-29 LAB — MAGNESIUM: Magnesium: 1.5 mg/dL — ABNORMAL LOW (ref 1.7–2.4)

## 2016-11-29 MED ORDER — POTASSIUM CHLORIDE 20 MEQ PO PACK
60.0000 meq | PACK | Freq: Once | ORAL | Status: AC
  Administered 2016-11-29: 60 meq
  Filled 2016-11-29: qty 3

## 2016-11-29 MED ORDER — FUROSEMIDE 10 MG/ML IJ SOLN
60.0000 mg | Freq: Once | INTRAMUSCULAR | Status: AC
  Administered 2016-11-29: 60 mg via INTRAMUSCULAR
  Filled 2016-11-29: qty 6

## 2016-11-29 NOTE — ED Provider Notes (Signed)
[facility]-EMERGENCY DEPT Provider Note   CSN: [id number] Arrival date & time: 11/29/16  1116     History   Chief Complaint Chief Complaint  Patient presents with  . Groin Swelling    HPI [name] [name] is a 76 y.o. male.  HPI   75yM with with increasing swelling. Pt is not the greatest historian. Most history from his daughter and review of records. Progressively worsening over the past few weeks. Chronic LE edema but now markedly worse and now involving genitals and abdomen.Torsemide recently increased without improvement. Weight has steadily increased. He states the swelling is uncomfortable. No urinary complaints.  Denies any acute respiratory complaints.   Past Medical History:  Diagnosis Date  . Cataract   . Cholelithiasis   . Diabetes mellitus type 2 in obese (HCC)   . DVT (deep venous thrombosis) (HCC)   . GERD (gastroesophageal reflux disease)   . Hypertension   . MI (myocardial infarction) 01/2015  . Pancreatitis 01/2015  . Pressure ulcer     Patient Active Problem List   Diagnosis Date Noted  . Poorly controlled type 2 diabetes mellitus with circulatory disorder (HCC) 01/30/2016  . Deep vein thrombosis (DVT) of proximal lower extremity (HCC)   . Presence of IVC filter   . Acute DVT (deep venous thrombosis) (HCC)   . 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 Aspiration pneumonia (HCC)   . Hypokalemia 02/04/2015  . Ischemic cardiomyopathy   . Cardiomyopathy, ischemic   . DVT (deep venous thrombosis) (HCC)   . Demand ischemia of myocardium (HCC) 01/22/2015  . Erosive esophagitis   . HCAP (healthcare-associated pneumonia)   . Shock (HCC)   . Acute blood loss anemia 01/21/2015  . Hemorrhagic shock 01/21/2015  . Acute respiratory failure with hypoxemia (HCC)   . Septic shock (HCC)   . Ulcerative esophagitis   . Hematemesis with nausea   . Melena   . CAD, multiple vessel: CTO of RCA, Cx with severe prox LAD disease     Class: Diagnosis of  . Cholecystitis, acute   .  Metabolic acidosis   . Acute systolic CHF (congestive heart failure) (HCC)   . Acute renal failure syndrome (HCC)   . NSTEMI (non-ST elevated myocardial infarction) (HCC) 01/15/2015  . Cardiomyopathy (HCC) 01/15/2015  . Choledocholithiasis 01/15/2015  . Hypocalcemia 01/15/2015  . Ileus (HCC) 01/15/2015  . Syncope and collapse   . Acute systolic congestive heart failure (HCC)   . Essential hypertension   . Other specified hypotension   . Acute kidney injury (HCC)   . Acute gallstone pancreatitis 01/13/2015  . Elevated LFTs 01/13/2015  . Gallstones 01/13/2015  . Syncope 01/13/2015  . Hypotension 01/13/2015  . Bradycardia 01/13/2015  . AKI (acute kidney injury) (HCC) 01/13/2015  . Diabetes (HCC) 01/13/2015    Past Surgical History:  Procedure Laterality Date  . CARDIAC CATHETERIZATION N/A 01/17/2015   Procedure: Left Heart Cath and Coronary Angiography;  Surgeon: Lyn Records, MD;  Location: [facility] INVASIVE CV LAB;  Service: Cardiovascular;  Laterality: N/A;  . CORONARY ARTERY BYPASS GRAFT N/A 02/14/2015   Procedure: CORONARY ARTERY BYPASS GRAFTING (CABG) x three, using left internal mammary artery and left leg greater saphenous vein harvested endoscopically;  Surgeon: Kerin Perna, MD;  Location: [facility] OR;  Service: Open Heart Surgery;  Laterality: N/A;  . ESOPHAGOGASTRODUODENOSCOPY N/A 01/21/2015   Procedure: ESOPHAGOGASTRODUODENOSCOPY (EGD);  Surgeon: Hilarie Fredrickson, MD;  Location: [facility] ENDOSCOPY;  Service: Endoscopy;  Laterality: N/A;  to be done at  [facility]  . ESOPHAGOGASTRODUODENOSCOPY N/A 02/05/2015   Procedure: ESOPHAGOGASTRODUODENOSCOPY (EGD);  Surgeon: Hart Carwin, MD;  Location: [facility] ENDOSCOPY;  Service: Endoscopy;  Laterality: N/A;  . TEE WITHOUT CARDIOVERSION N/A 02/14/2015   Procedure: TRANSESOPHAGEAL ECHOCARDIOGRAM (TEE);  Surgeon: Kerin Perna, MD;  Location: [facility] OR;  Service: Open Heart Surgery;  Laterality: N/A;       Home Medications    Prior to Admission medications     Medication Sig Start Date End Date Taking? Authorizing Provider  aspirin EC 81 MG EC tablet Take 1 tablet (81 mg total) by mouth daily. 02/21/15   Wayne E Gold, PA-C  atorvastatin (LIPITOR) 40 MG tablet Take 1 tablet (40 mg total) by mouth daily. 06/19/16   Laqueta Linden, MD  Cholecalciferol (VITAMIN D-3) 1000 units CAPS Take 1 capsule by mouth daily.     Historical Provider, MD  Folic Acid-Vit B6-Vit B12 (FOLBEE) 2.5-25-1 MG TABS tablet Take 1 tablet by mouth daily.    Historical Provider, MD  glucose blood (ONETOUCH VERIO) test strip Use to test blood sugar 2 times daily as instructed. Dx: E11.59 08/06/16   Carlus Pavlov, MD  insulin NPH Human (HUMULIN N) 100 UNIT/ML injection Inject 6 units before b'fast and another 4 units at bedtime. PENS, PLEASE!! 08/06/16   Carlus Pavlov, MD  Insulin Pen Needle (CAREFINE PEN NEEDLES) 32G X 4 MM MISC Use 2x a day 05/22/16   Carlus Pavlov, MD  latanoprost (XALATAN) 0.005 % ophthalmic solution Place 1 drop into both eyes at bedtime. 01/02/15   Historical Provider, MD  levothyroxine (SYNTHROID, LEVOTHROID) 50 MCG tablet Take 1 tablet (50 mcg total) by mouth daily before breakfast. 11/04/16   Carlus Pavlov, MD  lipase/protease/amylase (CREON) 36000 UNITS CPEP capsule Take 24,000 Units by mouth 3 (three) times daily before meals.    Historical Provider, MD  Loperamide HCl (IMODIUM PO) Take by mouth at bedtime. 1-2 Tablets @@ bed time    Historical Provider, MD  Multiple Vitamins-Minerals (CENTRUM SILVER PO) Take 5 mLs by mouth daily.     Historical Provider, MD  ondansetron (ZOFRAN) 4 MG tablet Take 4 mg by mouth every 8 (eight) hours as needed for nausea or vomiting.    Historical Provider, MD  [facility] DELICA LANCETS 33G MISC Use to test blood sugar 2 times daily. Dx: E11.59 09/12/15   Carlus Pavlov, MD  pantoprazole (PROTONIX) 40 MG tablet Take 40 mg by mouth 2 (two) times daily.    Historical Provider, MD  thiamine (VITAMIN B-1) 50 MG tablet  Take 50 mg by mouth daily.     Historical Provider, MD  torsemide (DEMADEX) 20 MG tablet Take 20 mg by mouth daily.    Historical Provider, MD  vitamin C (ASCORBIC ACID) 500 MG tablet Take 500 mg by mouth daily.     Historical Provider, MD  Zinc 50 MG CAPS Take 45 mg by mouth as directed.     Historical Provider, MD    Family History Family History  Problem Relation Age of Onset  . Heart attack Brother 55  . Pancreatic cancer Sister 12    died in her 88s.     Social History Social History  Substance Use Topics  . Smoking status: Never Smoker  . Smokeless tobacco: Never Used  . Alcohol use No     Allergies   Patient has no known allergies.   Review of Systems Review of Systems  All systems reviewed and negative, other than as noted in HPI.  Physical Exam Updated Vital Signs BP (!) 150/70 (BP Location: Left Arm)   Pulse 77   Temp 97.7 F (36.5 C) (Oral)   Resp 20   Ht 5\' 11"  (1.803 m)   Wt 168 lb (76.2 kg)   SpO2 100%   BMI 23.43 kg/m   Physical Exam  Constitutional: He appears well-developed and well-nourished. No distress.  HENT:  Head: Normocephalic and atraumatic.  Laying in bed. Appears tired, but not toxic.   Eyes: Conjunctivae are normal. Right eye exhibits no discharge. Left eye exhibits no discharge.  Neck: Neck supple.  Cardiovascular: Normal rate, regular rhythm and normal heart sounds.  Exam reveals no gallop and no friction rub.   No murmur heard. Pulmonary/Chest: Effort normal and breath sounds normal. No respiratory distress.  Abdominal: Soft. He exhibits distension. There is no tenderness.  gtube  Musculoskeletal: He exhibits edema. He exhibits no tenderness.  Wound vac to sacral decubitus ulcer. Anasarca. Swelling from feet up into abdomen. Scrotal/penile swelling. No concerning skin lesions. Can retract foreskin. No discharge.   Neurological: He is alert.  Skin: Skin is warm and dry.  Psychiatric: He has a normal mood and affect. His  behavior is normal. Thought content normal.  Nursing note and vitals reviewed.    ED Treatments / Results  Labs (all labs ordered are listed, but only abnormal results are displayed) Labs Reviewed  CBC WITH DIFFERENTIAL/PLATELET - Abnormal; Notable for the following:       Result Value   RBC 2.82 (*)    Hemoglobin 9.4 (*)    HCT 28.0 (*)    RDW 16.4 (*)    Platelets 122 (*)    Eosinophils Absolute 1.1 (*)    All other components within normal limits  URINALYSIS, ROUTINE W REFLEX MICROSCOPIC - Abnormal; Notable for the following:    APPearance HAZY (*)    Leukocytes, UA TRACE (*)    Squamous Epithelial / LPF 0-5 (*)    All other components within normal limits  COMPREHENSIVE METABOLIC PANEL - Abnormal; Notable for the following:    Potassium 2.9 (*)    Glucose, Bld 149 (*)    BUN 30 (*)    Creatinine, Ser 1.71 (*)    Calcium 7.5 (*)    Albumin 2.1 (*)    AST 86 (*)    ALT 65 (*)    GFR calc non Af Amer 38 (*)    GFR calc Af Amer 44 (*)    All other components within normal limits  MAGNESIUM - Abnormal; Notable for the following:    Magnesium 1.5 (*)    All other components within normal limits  TSH  T4, FREE    EKG  EKG Interpretation  Date/Time:  Sunday November 29 2016 12:15:07 EDT Ventricular Rate:  73 PR Interval:    QRS Duration: 123 QT Interval:  420 QTC Calculation: 463 R Axis:   81 Text Interpretation:  Sinus rhythm Ventricular premature complex Nonspecific intraventricular conduction delay Borderline repolarization abnormality Confirmed by Juleen China  MD, Nayana Lenig ([id number]) on 11/29/2016 2:51:41 PM       Radiology No results found.  Procedures Procedures (including critical care time)  Medications Ordered in ED Medications - No data to display   Initial Impression / Assessment and Plan / ED Course  I have reviewed the triage vital signs and the nursing notes.  Pertinent labs & imaging results that were available during my care of the patient were  reviewed by me and considered in  my medical decision making (see chart for details).     75yM with anasarca. He is mostly concerned about his penile/scrotal edema but this doesn't appear to be a local process but rather manifestation of systemic/other process.  Malnutrition has been a constant battle for him. Consistent hypoalbunemia. He has a g-tube but daughter reports most nutrition is PO currently.    Increased hydrostatic pressure is probably contributing with multiple areas of thrombosis noted on CT of abdomen and pelvis [facility] which is noted below. Previously on warfarin and had IVC filter. Filter removed 08/2015 and not currently anticoagulated.  I couldn't readily find rationale for discontinuing on a cursory review of records.   Also hypothyroid which seems to be the most acute problem. TSH on 2/22 was 105. Started on 50 mcg levothyroxine and then increased to 75 mcg which he is on currently. He has been on levothyroxine for several weeks now and I think reasonable to recheck TSH/t4 levels.  He also has a history of CAD and chronic systolic HF/ischemic cardiomyopathy.  Last (?) ECHO 08/2015 with EF of 45%. This is a consideration but currently I do not think this is one of the primary mechanisms.   Overall, he is markedly fluid overloaded.   10/07/16, 147 lbs 11/02/16, 153 lbs 11/10/16, 158 lbs 11/29/16 168 lbs  Pt is very reluctant to be admitted. This is understandable with his past history of prolonged hospitalizations. I explained that there are too many problems that are beyond what I can reasonably address in the emergency room with regards to time and also require multidisciplinary management (endocrine, GI, cardiology, etc) at a level beyond what I am capable of providing him. With the rapid progression of his symptoms, I'm also concerned about him getting the care in the time frame he needs. This was discussed with his daughter present. He has medical decision making  capability and is declining admission. Daughter reports upcoming appointment this week in heart failure clinic. Advised to touch base with endocrinologist. This   CT ABDOMEN PELVIS W CONTRAST (ROUTINE), 11/16/2016 10:06 AM  INDICATION:pancreatitis \ K85.90 Pancreatic abscess  COMPARISON: Numerous prior CT examinations including 03/02/2016.  Impression:  1. 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 2. 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  3. Slight interval increase in the degree of abdominopelvic ascites. 4. There is diffuse small bowel thickening which may relate to hypoproteinemia. There is, however, more marked submucosal edema throughout the colon to the level of the sigmoid. While this could represent portal colopathy or manifestations of hypoproteinemia, it would be difficult to exclude infectious colitis. Ischemic colitis would seem less likely given the distribution. 5. Anasarca, likely related to hypoproteinemia. 6. Numerous additional findings as described above.   FINDINGS:   LOWER CHEST: .Heart/vessel: Papillary muscular calcifications in the left ventricle are again noted. Mild cardiomegaly.  .Lungs: Slight improvement in lower lobe aeration compared to prior CT. Persistent hyperdensities in the long bases, possibly aspirated contrast material. .Pleura: As compared to June 2017, interval reduction in bilateral pleural effusions, now trace on the left and small right.   ABDOMEN: .Liver: Diffuse periportal edema. .Gallbladder/biliary: Status post cholecystectomy. Marland KitchenSpleen: Borderline splenomegaly. .Pancreas: Atrophy of the pancreatic tail parenchyma with ill-defined soft tissue. Chronic thrombosis of the splenic vein at the level of the pancreatic tail. No intrapancreatic collections are identified. There is also marked parenchymal atrophy the level of the  pancreatic neck and upper head. .Adrenals: Within  normal limits. .Kidneys: Symmetric enhancement without hydronephrosis. Bilateral renal cortical cysts. Nonspecific but otherwise symmetric perirenal edema. .Peritoneum: Moderate to high volume abdominopelvic ascites, slightly increased from June 2017. Marland KitchenMesentery: Diffuse mesenteric edema. Anterior diaphragmatic lymph nodes, nonspecific and otherwise similar to comparison. Marland KitchenExtraperitoneum: Scattered tiny lymph nodes. .GI tract: Mural thickening of the visualized lower esophagus and esophagogastric junction. Most of the small bowel demonstrates mural thickening which is nonspecific. Of note, however, there is substantial submucosal edema and mucosal hyperenhancement throughout the colon, with relative sparing of the rectum and sigmoid. Negative for pneumatosis. Gastrojejunostomy tube is identified, with the distal feeding port in the proximal jejunum. .Vascular: Moderate degree of calcific plaque throughout a nonaneurysmal abdominal aorta, as well as numerous major branch vessels. 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 The superior mesenteric vein is severely attenuated, suggesting chronic thrombosis.  PELVIS: .Peritoneum: As above. Marland KitchenExtraperitoneum: Scattered subcentimeter iliac chain lymph nodes. .Ureters: No evidence of ureteric obstruction. .Bladder: Within normal limits. .Reproductive system: Within normal limits. .Vascular: Scattered calcific plaque throughout the iliofemoral arterial tree.  MSK: .Anasarca, with diffuse body wall edema again noted. Multilevel degenerative endplate changes throughout the visualized thoracolumbar spine. Polyarticular osteoarthritis. Osteopenia. Status post median sternotomy.  Final Clinical Impressions(s) / ED Diagnoses   Final diagnoses:  Anasarca    New Prescriptions New Prescriptions   No medications on file     Raeford Razor,  MD 11/29/16 1536

## 2016-11-29 NOTE — ED Notes (Signed)
lab into draw 

## 2016-11-29 NOTE — ED Triage Notes (Signed)
Patient c/o swelling in groin. Per patient swelling started Thursday, reports pain. Patient states swelling of scrotum and penis. Denies any problems with urination or redness. Denies any fevers. Per family patient always has swelling in lower extremities and abd but has now had increase in swelling in those areas as well. Patient takes torsemide which they increased on Wednesday with no improvement. Denies any shortness of breath.

## 2016-11-29 NOTE — ED Notes (Signed)
Spoke with lab had to redraw a TSH tube wasn't enough blood. Able to get enough sample in lab.

## 2016-11-29 NOTE — Telephone Encounter (Signed)
Patient's daughter called answering service. He's been managed recently over the phone for worsening edema and weight gain. Torsemide increased to 20mg  BID. Most recent Cr 10/2016 was 1.88, acutely worsened over prior normal value in 2017. His daughter says they are also following up with GI soon regarding chronic pancreatitis and possible liver issues. His weight this AM is 168 lb and he has severe scrotal, penis, and leg edema this morning. No longer putting out on the diuretic as expected. At this point he is up 21lb from prior weight earlier this year and seems to have failed outpatient titration of diuretics. Would not feel comfortable further pushing up diuretic without knowing labs given his acute kidney injury in 10/2016. I advised they proceed to the hospital for further evaluation including labwork and probable IV diuresis depending on outcome. [name] said her dad may be hesitant and wondered if this is something that could be in-and-out based on an ER visit but I told her based on what I am seeing this usually requires a few days of diuresis. I told her to convey to the patient that his situation could only snowball and worsen if he does not seek care since symptoms are only worsening. She was grateful for call and will try to convince him to come. Dayna Dunn PA-C

## 2016-11-29 NOTE — ED Notes (Signed)
Pillow to L buttock due to a pressure ulcer to the L buttocks

## 2016-11-29 NOTE — ED Notes (Signed)
Pt with a history of chronic pancreatitis, swelling to his abd and groin since weds- called [facility] who suggested that they increase the lasix, but it has been to no avail- Whe daughter called cardiologist on call this am, was told to bring to [facility] for evaluation

## 2016-11-30 ENCOUNTER — Telehealth: Payer: Self-pay | Admitting: Internal Medicine

## 2016-11-30 ENCOUNTER — Telehealth: Payer: Self-pay

## 2016-11-30 DIAGNOSIS — E1159 Type 2 diabetes mellitus with other circulatory complications: Secondary | ICD-10-CM

## 2016-11-30 DIAGNOSIS — I10 Essential (primary) hypertension: Principal | ICD-10-CM

## 2016-11-30 MED ORDER — LEVOTHYROXINE SODIUM 100 MCG PO TABS: 100.0000 ug | ORAL_TABLET | Freq: Every day | ORAL | 1 refills | Status: DC

## 2016-11-30 NOTE — Telephone Encounter (Signed)
Patient daughter returned phone call, asked if they could get labs drawn  At their primary care office, if I could fax over the orders. I advised that this would be okay if the patient agreed. Patient daughter will call back to let me know if he agrees to this. No other questions at this time.

## 2016-11-30 NOTE — Telephone Encounter (Signed)
Pt daughter says pt went to ED yesterday - has f/u scheduled for CHF clinic tomorrow - daughter says pt is a little better today - having multiple issues and looking forward to CHF appt - appreciative of call

## 2016-11-30 NOTE — Telephone Encounter (Signed)
Called and LVM advising patients daughter of medication changes, and to make lab appointment in 5-6 weeks. Advised her to call back to make lab appointment. Patient also needs to follow up with PCP.

## 2016-11-30 NOTE — Telephone Encounter (Signed)
[name] [name], he needs to follow with PCP. Fron the endo pov, he needs to increase LT4 to 100 mcg daily (please send this to his pharm) And we need a TSH and fT4 in 5-6 weeks.

## 2016-11-30 NOTE — Telephone Encounter (Signed)
One of the nurses over at the [facility] ED called and said that this patient was in there over the weekend, the patient was advised to f/u with [place].  Please be advised.

## 2016-11-30 NOTE — Telephone Encounter (Signed)
Called and LVM for patient daughter [name], advised her of med changes and to call back to make lab appointment in 5-6 weeks.  Left call back number,.

## 2016-11-30 NOTE — Telephone Encounter (Signed)
Please advise 

## 2016-12-01 ENCOUNTER — Ambulatory Visit (HOSPITAL_COMMUNITY)
Admission: RE | Admit: 2016-12-01 | Discharge: 2016-12-01 | Disposition: A | Discharge status: Home / Self Care | Payer: Medicare Other | Source: Ambulatory Visit | Attending: Internal Medicine | Admitting: Internal Medicine

## 2016-12-01 ENCOUNTER — Encounter (HOSPITAL_COMMUNITY): Payer: Self-pay

## 2016-12-01 VITALS — BP 142/64 | HR 84 | Wt 169.0 lb

## 2016-12-01 DIAGNOSIS — E876 Hypokalemia: Secondary | ICD-10-CM | POA: Diagnosis not present

## 2016-12-01 DIAGNOSIS — R748 Abnormal levels of other serum enzymes: Secondary | ICD-10-CM | POA: Diagnosis not present

## 2016-12-01 DIAGNOSIS — E43 Unspecified severe protein-calorie malnutrition: Secondary | ICD-10-CM | POA: Diagnosis not present

## 2016-12-01 DIAGNOSIS — E1122 Type 2 diabetes mellitus with diabetic chronic kidney disease: Secondary | ICD-10-CM | POA: Insufficient documentation

## 2016-12-01 DIAGNOSIS — R600 Localized edema: Secondary | ICD-10-CM | POA: Diagnosis not present

## 2016-12-01 DIAGNOSIS — E669 Obesity, unspecified: Secondary | ICD-10-CM | POA: Diagnosis not present

## 2016-12-01 DIAGNOSIS — K859 Acute pancreatitis without necrosis or infection, unspecified: Secondary | ICD-10-CM | POA: Diagnosis not present

## 2016-12-01 DIAGNOSIS — R188 Other ascites: Secondary | ICD-10-CM

## 2016-12-01 DIAGNOSIS — Z86718 Personal history of other venous thrombosis and embolism: Secondary | ICD-10-CM | POA: Insufficient documentation

## 2016-12-01 DIAGNOSIS — E785 Hyperlipidemia, unspecified: Secondary | ICD-10-CM | POA: Diagnosis not present

## 2016-12-01 DIAGNOSIS — I429 Cardiomyopathy, unspecified: Secondary | ICD-10-CM | POA: Diagnosis not present

## 2016-12-01 DIAGNOSIS — I6523 Occlusion and stenosis of bilateral carotid arteries: Secondary | ICD-10-CM | POA: Insufficient documentation

## 2016-12-01 DIAGNOSIS — I252 Old myocardial infarction: Secondary | ICD-10-CM | POA: Diagnosis not present

## 2016-12-01 DIAGNOSIS — I255 Ischemic cardiomyopathy: Secondary | ICD-10-CM

## 2016-12-01 DIAGNOSIS — E038 Other specified hypothyroidism: Secondary | ICD-10-CM

## 2016-12-01 DIAGNOSIS — N183 Chronic kidney disease, stage 3 unspecified: Secondary | ICD-10-CM

## 2016-12-01 DIAGNOSIS — R7989 Other specified abnormal findings of blood chemistry: Secondary | ICD-10-CM

## 2016-12-01 DIAGNOSIS — Z7982 Long term (current) use of aspirin: Secondary | ICD-10-CM | POA: Insufficient documentation

## 2016-12-01 DIAGNOSIS — K219 Gastro-esophageal reflux disease without esophagitis: Secondary | ICD-10-CM | POA: Insufficient documentation

## 2016-12-01 DIAGNOSIS — Z794 Long term (current) use of insulin: Secondary | ICD-10-CM | POA: Insufficient documentation

## 2016-12-01 DIAGNOSIS — I5021 Acute systolic (congestive) heart failure: Secondary | ICD-10-CM | POA: Diagnosis not present

## 2016-12-01 DIAGNOSIS — L89154 Pressure ulcer of sacral region, stage 4: Secondary | ICD-10-CM | POA: Diagnosis not present

## 2016-12-01 DIAGNOSIS — I2510 Atherosclerotic heart disease of native coronary artery without angina pectoris: Secondary | ICD-10-CM | POA: Insufficient documentation

## 2016-12-01 DIAGNOSIS — Z951 Presence of aortocoronary bypass graft: Secondary | ICD-10-CM | POA: Diagnosis not present

## 2016-12-01 DIAGNOSIS — I5023 Acute on chronic systolic (congestive) heart failure: Secondary | ICD-10-CM | POA: Insufficient documentation

## 2016-12-01 DIAGNOSIS — I10 Essential (primary) hypertension: Secondary | ICD-10-CM | POA: Diagnosis not present

## 2016-12-01 DIAGNOSIS — I864 Gastric varices: Secondary | ICD-10-CM | POA: Insufficient documentation

## 2016-12-01 DIAGNOSIS — E039 Hypothyroidism, unspecified: Secondary | ICD-10-CM | POA: Insufficient documentation

## 2016-12-01 DIAGNOSIS — R945 Abnormal results of liver function studies: Secondary | ICD-10-CM

## 2016-12-01 DIAGNOSIS — I5022 Chronic systolic (congestive) heart failure: Secondary | ICD-10-CM | POA: Diagnosis present

## 2016-12-01 DIAGNOSIS — I129 Hypertensive chronic kidney disease with stage 1 through stage 4 chronic kidney disease, or unspecified chronic kidney disease: Secondary | ICD-10-CM | POA: Diagnosis not present

## 2016-12-01 DIAGNOSIS — I130 Hypertensive heart and chronic kidney disease with heart failure and stage 1 through stage 4 chronic kidney disease, or unspecified chronic kidney disease: Secondary | ICD-10-CM | POA: Diagnosis not present

## 2016-12-01 LAB — BASIC METABOLIC PANEL
Anion gap: 9 (ref 5–15)
BUN: 26 mg/dL — AB (ref 6–20)
CALCIUM: 7.6 mg/dL — AB (ref 8.9–10.3)
CO2: 25 mmol/L (ref 22–32)
Chloride: 108 mmol/L (ref 101–111)
Creatinine, Ser: 1.7 mg/dL — ABNORMAL HIGH (ref 0.61–1.24)
GFR, EST AFRICAN AMERICAN: 44 mL/min — AB (ref >60)
GFR, EST NON AFRICAN AMERICAN: 38 mL/min — AB (ref >60)
Glucose, Bld: 150 mg/dL — ABNORMAL HIGH (ref 65–99)
Potassium: 3.2 mmol/L — ABNORMAL LOW (ref 3.5–5.1)
Sodium: 142 mmol/L (ref 135–145)

## 2016-12-01 LAB — BRAIN NATRIURETIC PEPTIDE: B NATRIURETIC PEPTIDE 5: 222.5 pg/mL — AB (ref 0.0–100.0)

## 2016-12-01 MED ORDER — POTASSIUM CHLORIDE CRYS ER 20 MEQ PO TBCR: 40.0000 meq | EXTENDED_RELEASE_TABLET | Freq: Every day | ORAL | 6 refills | Status: DC

## 2016-12-01 MED ORDER — FUROSEMIDE 10 MG/ML IJ SOLN: 80.0000 mg | Freq: Once | INTRAMUSCULAR | 0 refills | Status: DC

## 2016-12-01 MED ORDER — POTASSIUM CHLORIDE CRYS ER 10 MEQ PO TBCR: 40.0000 meq | EXTENDED_RELEASE_TABLET | Freq: Every day | ORAL | 6 refills | Status: DC

## 2016-12-01 MED ORDER — TORSEMIDE 20 MG PO TABS: 40.0000 mg | ORAL_TABLET | Freq: Two times a day (BID) | ORAL | 6 refills | Status: DC

## 2016-12-01 MED ORDER — METOLAZONE 2.5 MG PO TABS: 2.5000 mg | ORAL_TABLET | Freq: Every day | ORAL | 3 refills | Status: DC | PRN

## 2016-12-01 MED FILL — FUROSEMIDE 10 MG/ML SOLN: 10 | 1 days supply | Qty: 8 | Fill #0

## 2016-12-01 NOTE — Patient Instructions (Addendum)
Routine lab work today. Will notify you of abnormal results, otherwise no news is good news!  INCREASE Torsemide to 40 mg (2 tabs) twice daily.  START Potassium 40 meq (2 tabs) once daily.  START metolazone 2.5 mg tablet once daily AS NEEDED for weight gain 3 lbs or more overnight or 5 lbs or more in 1 week.  Will set you up with home health care with [facility]. Will arrange to have IV lasix administered in the home tomorrow. They will call you to arrange a time to come to your home.  Will schedule you for an echocardiogram at [facility]. Address: [address], [place], [place] [address]  Phone: [phone]  Follow up next week with Dr. Gala Romney.  Do the following things EVERYDAY: 1) Weigh yourself in the morning before breakfast. Write it down and keep it in a log. 2) Take your medicines as prescribed 3) Eat low salt foods-Limit salt (sodium) to 2000 mg per day.  4) Stay as active as you can everyday 5) Limit all fluids for the day to less than 2 liters

## 2016-12-01 NOTE — Progress Notes (Signed)
PCP: Dr Sherryll Burger Primary Cardiologist: Dr Purvis Sheffield GI: [facility] Dr Alycia Rossetti   HPI: Mr Rubenstein is a 76 year old with history CAD, CABG, DVT, ICM, bilateral carotid stenosis, htn, hyperlipidemia, necrotizing pancreatitis, and hypothyroidism.    In January 2017 through March 2017 he had a prolonged hospitalzation pancreatitis. Hospital course complicated by pressure ulcer, malnutrition, and ascites. Required 2 paracentesis and placement of J tube.   Due to ongoing fatigue he was evaluated by his PCP in February of the year. TSH was markedly elevated at 105 on 10/28/16. Started on levothyroxine.   Since the end of January he has had progressive weight gain. (147>163 pounds). Diuretics have been adjusted but he has had poor results. He was seen by Dr Purvis Sheffield on March 6th. BB was stopped at that time.   On March the 25th he was evaluated at [facility] for increased edema. He was given IV lasix and discharged home. Offered hospital admit however he declined.    Today he presents as new patient at the request of Dr Purvis Sheffield for assistance with volume overload. Overall complaining of fatigue. SOB with exertion. Poor appetite. Weight at home trending up to 169 pounds on 20 mg of torsemide twice a day. Drinking less than 2 liters. Limited activity due to the amount of edema in his legs. He had  3 daughters that are in and out of the house every other day. Followed at [facility] for sacral pressure ulcer. [facility] just completed service. Lives with wife.   CT abdomen 11/04/2016  1. 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  2. 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   3. Slight interval increase in the degree of abdominopelvic ascites.  4. There is diffuse small bowel thickening which may relate to hypoproteinemia. There is, however, more marked submucosal edema throughout the colon to the level of the sigmoid. While this  could represent portal colopathy or manifestations of hypoproteinemia, it would be difficult to exclude infectious colitis. Ischemic colitis would seem less likely given the distribution.  5. Anasarca, likely related to hypoproteinemia.  6. Numerous additional findings as described above.    ECHO 2016 EF 45%.   Social history:His daughter, Harvest Forest, is a retired PA who worked for the [employer] for 20 years. Lives with his wife.   ROS: All systems negative except as listed in HPI, PMH and Problem List.  SH:  Social History   Social History  . Marital status: Married    Spouse name: N/A  . Number of children: N/A  . Years of education: N/A   Occupational History  . Not on file.   Social History Main Topics  . Smoking status: Never Smoker  . Smokeless tobacco: Never Used  . Alcohol use No  . Drug use: No  . Sexual activity: Yes   Other Topics Concern  . Not on file   Social History Narrative  . No narrative on file    FH:  Family History  Problem Relation Age of Onset  . Heart attack Brother 55  . Pancreatic cancer Sister 74    died in her 103s.     Past Medical History:  Diagnosis Date  . Cataract   . Cholelithiasis   . Diabetes mellitus type 2 in obese (HCC)   . DVT (deep venous thrombosis) (HCC)   . GERD (gastroesophageal reflux disease)   . Hypertension   . MI (myocardial infarction) 01/2015  .  Pancreatitis 01/2015  . Pressure ulcer     Current Outpatient Prescriptions  Medication Sig Dispense Refill  . aspirin EC 81 MG EC tablet Take 1 tablet (81 mg total) by mouth daily.    Marland Kitchen atorvastatin (LIPITOR) 40 MG tablet Take 1 tablet (40 mg total) by mouth daily. (Patient taking differently: Take 40 mg by mouth every evening. ) 90 tablet 3  . Cholecalciferol (VITAMIN D-3) 1000 units CAPS Take 1 capsule by mouth daily.     . Folic Acid-Vit B6-Vit B12 (FOLBEE) 2.5-25-1 MG TABS tablet Take 1 tablet by mouth daily.    Marland Kitchen glucose  blood (ONETOUCH VERIO) test strip Use to test blood sugar 2 times daily as instructed. Dx: E11.59 200 each 3  . insulin NPH Human (HUMULIN N) 100 UNIT/ML injection Inject 6 units before b'fast and another 4 units at bedtime. PENS, PLEASE!! 15 mL 2  . Insulin Pen Needle (CAREFINE PEN NEEDLES) 32G X 4 MM MISC Use 2x a day 100 each 11  . latanoprost (XALATAN) 0.005 % ophthalmic solution Place 1 drop into both eyes at bedtime.    Marland Kitchen levothyroxine (SYNTHROID, LEVOTHROID) 100 MCG tablet Take 1 tablet (100 mcg total) by mouth daily before breakfast. 90 tablet 1  . lipase/protease/amylase (CREON) 36000 UNITS CPEP capsule Take 24,000 Units by mouth 3 (three) times daily before meals.    . Loperamide HCl (IMODIUM PO) Take by mouth at bedtime. 1-2 Tablets @@ bed time    . Multiple Vitamins-Minerals (CENTRUM SILVER PO) Take 5 mLs by mouth daily.     . ondansetron (ZOFRAN) 4 MG tablet Take 4 mg by mouth every 8 (eight) hours as needed for nausea or vomiting.    Letta Pate DELICA LANCETS 33G MISC Use to test blood sugar 2 times daily. Dx: E11.59 200 each 3  . oxandrolone (OXANDRIN) 2.5 MG tablet Take 2.5 mg by mouth 2 (two) times daily.     . pantoprazole (PROTONIX) 40 MG tablet Take 40 mg by mouth 2 (two) times daily.    Marland Kitchen thiamine (VITAMIN B-1) 50 MG tablet Take 50 mg by mouth daily.     Marland Kitchen torsemide (DEMADEX) 20 MG tablet Take 20 mg by mouth daily.    . vitamin C (ASCORBIC ACID) 500 MG tablet Take 500 mg by mouth daily.     . Zinc 50 MG CAPS Take 45 mg by mouth as directed.      No current facility-administered medications for this encounter.     Vitals:   12/01/16 1315  BP: (!) 142/64  Pulse: 84  SpO2: 100%  Weight: 169 lb (76.7 kg)   Filed Weights   12/01/16 1315  Weight: 169 lb (76.7 kg)    PHYSICAL EXAM: General: Chronically ill appearing. Pale, NAD. Walked slowly in the clinic with a cane. Daughter present.  HEENT: normal Neck: supple. JVP to jaw. Carotids 2+ bilaterally; no bruits. No  lymphadenopathy or thryomegaly appreciated. Cor: PMI normal. Regular rate & rhythm. No rubs, gallops or murmurs. Lungs: LLL RLL crackles on room air.  Abdomen: soft, nontender, +++distended. No hepatosplenomegaly. No bruits or masses. Good bowel sounds. LUQ J tube Extremities: no cyanosis, clubbing, rash, R and LLE 3+ extending to his buttocks and scrotum.  Neuro: alert & orientedx3, cranial nerves grossly intact. Moves all 4 extremities w/o difficulty. Affect pleasant.   ASSESSMENT & PLAN:  I have reviewed medical record from [facility] and recent visit to [facility].   1. A/C Systolic HF. Suspect R>>>L  Last ECHO  2016. Repeat this week.  NYHA III. Massive volume overload. I am concerned about right sided heart failure. Offered hospital admit however he adamantly refuses. Today he is instructed to take 2.5 mg metolazone 30 mg minutes prior to even torsemide. Increased daily torsemide to 40 mg twice a day. I will also add 40 meq potassium daily.  Tomorrow he will receive 80 mg IV lasix by [facility] and hold torsemide Plan to resume torsemide on Thursday.   I will refer to [facility] for home diuretics and telemonitoring. Check BMET on Friday.  2. Hypothryoidism - Diagnosed 10/28/2016. Started on levothyroxine. Followed by PCP.  3. CAD S/P CABG x3 2016- No CP. On aspirin and statin 4. H/O Pancreatitis- 2017 requiring prolonged hospitalization.  5. Stage IV Sacral Pressure Ulcer- Followed by [facility] for wound care. VAC in place.  6. Severe Malnutrition- Most recent albumin 2.1. Has J tube in 2017 7. Ascites- Followed by GI at [facility].Has had paracentesis in the past most recent 11/2015 Needs [place] for possible paracentesis. He wants to hold off. Had CT in March with anasarca noted.   8. Hypokalemia- K on 11/30/15 2.9 Start 40 meq of potassium daily.  9. CKD Stage III- recent creatinine 1.7 on 3/25. Check BMET today.  10. Elevated liver function- AST and ALT elevated on 11/29/16 --> stop statin.   Follow up in weekly for 4  weeks. Today he was offered hospital admit for marked volume overload however he refuses and request outpatient management. He understands the risk of staying at home. Instructed to go to the ED if he declines. He understands if we have poor results we will need to address goals of care. We may ben looking at [facility].    Darothy Courtright NP-C  4:45 PM

## 2016-12-02 ENCOUNTER — Ambulatory Visit (HOSPITAL_COMMUNITY)
Admission: RE | Admit: 2016-12-02 | Discharge: 2016-12-02 | Disposition: A | Discharge status: Home / Self Care | Payer: Medicare Other | Source: Ambulatory Visit | Attending: Cardiology | Admitting: Cardiology

## 2016-12-02 ENCOUNTER — Ambulatory Visit (HOSPITAL_BASED_OUTPATIENT_CLINIC_OR_DEPARTMENT_OTHER)
Admission: RE | Admit: 2016-12-02 | Discharge: 2016-12-02 | Disposition: A | Discharge status: Home / Self Care | Payer: Medicare Other | Source: Ambulatory Visit

## 2016-12-02 ENCOUNTER — Telehealth (HOSPITAL_COMMUNITY): Payer: Self-pay

## 2016-12-02 VITALS — BP 110/60 | HR 97 | Wt 167.0 lb

## 2016-12-02 DIAGNOSIS — K219 Gastro-esophageal reflux disease without esophagitis: Secondary | ICD-10-CM | POA: Insufficient documentation

## 2016-12-02 DIAGNOSIS — I2510 Atherosclerotic heart disease of native coronary artery without angina pectoris: Secondary | ICD-10-CM | POA: Diagnosis not present

## 2016-12-02 DIAGNOSIS — E039 Hypothyroidism, unspecified: Secondary | ICD-10-CM | POA: Insufficient documentation

## 2016-12-02 DIAGNOSIS — E43 Unspecified severe protein-calorie malnutrition: Secondary | ICD-10-CM | POA: Insufficient documentation

## 2016-12-02 DIAGNOSIS — I130 Hypertensive heart and chronic kidney disease with heart failure and stage 1 through stage 4 chronic kidney disease, or unspecified chronic kidney disease: Secondary | ICD-10-CM | POA: Diagnosis not present

## 2016-12-02 DIAGNOSIS — E669 Obesity, unspecified: Secondary | ICD-10-CM | POA: Insufficient documentation

## 2016-12-02 DIAGNOSIS — I5022 Chronic systolic (congestive) heart failure: Secondary | ICD-10-CM | POA: Diagnosis not present

## 2016-12-02 DIAGNOSIS — I6523 Occlusion and stenosis of bilateral carotid arteries: Secondary | ICD-10-CM | POA: Insufficient documentation

## 2016-12-02 DIAGNOSIS — R748 Abnormal levels of other serum enzymes: Secondary | ICD-10-CM | POA: Insufficient documentation

## 2016-12-02 DIAGNOSIS — E785 Hyperlipidemia, unspecified: Secondary | ICD-10-CM | POA: Diagnosis not present

## 2016-12-02 DIAGNOSIS — I429 Cardiomyopathy, unspecified: Secondary | ICD-10-CM | POA: Diagnosis not present

## 2016-12-02 DIAGNOSIS — L89154 Pressure ulcer of sacral region, stage 4: Secondary | ICD-10-CM | POA: Diagnosis not present

## 2016-12-02 DIAGNOSIS — K7460 Unspecified cirrhosis of liver: Secondary | ICD-10-CM | POA: Diagnosis not present

## 2016-12-02 DIAGNOSIS — Z7982 Long term (current) use of aspirin: Secondary | ICD-10-CM | POA: Insufficient documentation

## 2016-12-02 DIAGNOSIS — N183 Chronic kidney disease, stage 3 (moderate): Secondary | ICD-10-CM | POA: Insufficient documentation

## 2016-12-02 DIAGNOSIS — Z794 Long term (current) use of insulin: Secondary | ICD-10-CM | POA: Insufficient documentation

## 2016-12-02 DIAGNOSIS — E1122 Type 2 diabetes mellitus with diabetic chronic kidney disease: Secondary | ICD-10-CM | POA: Insufficient documentation

## 2016-12-02 DIAGNOSIS — E876 Hypokalemia: Secondary | ICD-10-CM | POA: Diagnosis not present

## 2016-12-02 DIAGNOSIS — I864 Gastric varices: Secondary | ICD-10-CM | POA: Insufficient documentation

## 2016-12-02 DIAGNOSIS — I252 Old myocardial infarction: Secondary | ICD-10-CM | POA: Diagnosis not present

## 2016-12-02 DIAGNOSIS — Z951 Presence of aortocoronary bypass graft: Secondary | ICD-10-CM | POA: Insufficient documentation

## 2016-12-02 DIAGNOSIS — R188 Other ascites: Secondary | ICD-10-CM | POA: Diagnosis not present

## 2016-12-02 LAB — ECHOCARDIOGRAM COMPLETE: Weight: 2672 oz

## 2016-12-02 MED ORDER — FUROSEMIDE 10 MG/ML IJ SOLN
80.0000 mg | Freq: Once | INTRAMUSCULAR | Status: AC
  Administered 2016-12-02: 80 mg via INTRAVENOUS
  Filled 2016-12-02: qty 8

## 2016-12-02 MED ORDER — SPIRONOLACTONE 25 MG PO TABS: 25.0000 mg | ORAL_TABLET | Freq: Every day | ORAL | 3 refills | Status: DC

## 2016-12-02 NOTE — Patient Instructions (Addendum)
STOP Atorvastatin (Lipitor).  START spironolactone 25 mg tablet once daily.  Will set you up with [facility] for Heart Failure management, education, medication adjustments and regimen, vital signs, and as needed IV Lasix. They will contact you to set up your initial home health visit. [phone]  Follow up next week as scheduled.  Do the following things EVERYDAY: 1) Weigh yourself in the morning before breakfast. Write it down and keep it in a log. 2) Take your medicines as prescribed 3) Eat low salt foods-Limit salt (sodium) to 2000 mg per day.  4) Stay as active as you can everyday 5) Limit all fluids for the day to less than 2 liters

## 2016-12-02 NOTE — Telephone Encounter (Signed)
Received call from [facility] that patient is unable to enroll with their program due to insurance conflict. [facility] able to start skilled nursing assessment, med management, education, vitals, and PRN IV lasix tomorrow.  Left VM on CM melissa's line to call back to get patient enrolled. Amy Clegg NP-C spoke with patient's daughter to advise to come to CHF clinic today to get IV lasix as previously planned with [facility] today. Daughter aware and agreeable and will bring patient today.  Ave Filter, RN

## 2016-12-02 NOTE — Progress Notes (Signed)
PIV 24 g inserted x 1 attempt in LLW, 80 mg IV lasix x 1 dose pushed over 2 minutes and saline locked. No urine output while in clinic, patient sent to echo dept per Dr. Gala Romney VO.

## 2016-12-02 NOTE — Progress Notes (Signed)
PCP: Dr Sherryll Burger Primary Cardiologist: Dr Purvis Sheffield GI: [facility] Dr Alycia Rossetti   HPI: Mr Eiland is a 76 year old with history CAD, CABG, DVT, ICM, bilateral carotid stenosis, htn, hyperlipidemia, necrotizing pancreatitis, and hypothyroidism.    In January 2017 through March 2017 he had a prolonged hospitalzation pancreatitis. Hospital course complicated by pressure ulcer, malnutrition, and ascites. Required 2 paracentesis and placement of J tube.   Due to ongoing fatigue he was evaluated by his PCP in February of the year. TSH was markedly elevated at 105 on 10/28/16. Started on levothyroxine.   Since the end of January he has had progressive weight gain. (147>163 pounds). Diuretics have been adjusted but he has had poor results. He was seen by Dr Purvis Sheffield on March 6th. BB was stopped at that time.   On March the 25th he was evaluated at [facility] for increased edema. He was given IV lasix and discharged home. Offered hospital admit however he declined.    Today he returns for HF follow up. He was unable to obtain IV lasix so he returned to HF clinic for IV lasix. Overall feeling ok. Complaining fatigue. Denies orthopnea or  PND. No CP. + sever ab bloating. Poor appetite. Weight at home 169 pounds. Remains edematous. No fever or chills. Lives with wife.   CT abdomen 11/04/2016  1. 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  2. 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   3. Slight interval increase in the degree of abdominopelvic ascites.  4. There is diffuse small bowel thickening which may relate to hypoproteinemia. There is, however, more marked submucosal edema throughout the colon to the level of the sigmoid. While this could represent portal colopathy or manifestations of hypoproteinemia, it would be difficult to exclude infectious colitis. Ischemic colitis would seem less likely given the distribution.    5. Anasarca, likely related to hypoproteinemia.  6. Numerous additional findings as described above.    ECHO 2016 EF 45%.   Social history:His daughter, Harvest Forest, is a retired PA who worked for the [employer] for 20 years. Lives with his wife.   ROS: All systems negative except as listed in HPI, PMH and Problem List.  SH:  Social History   Social History  . Marital status: Married    Spouse name: N/A  . Number of children: N/A  . Years of education: N/A   Occupational History  . Not on file.   Social History Main Topics  . Smoking status: Never Smoker  . Smokeless tobacco: Never Used  . Alcohol use No  . Drug use: No  . Sexual activity: Yes   Other Topics Concern  . Not on file   Social History Narrative  . No narrative on file    FH:  Family History  Problem Relation Age of Onset  . Heart attack Brother 55  . Pancreatic cancer Sister 40    died in her 62s.     Past Medical History:  Diagnosis Date  . Cataract   . Cholelithiasis   . Diabetes mellitus type 2 in obese (HCC)   . DVT (deep venous thrombosis) (HCC)   . GERD (gastroesophageal reflux disease)   . Hypertension   . MI (myocardial infarction) 01/2015  . Pancreatitis 01/2015  . Pressure ulcer     Current Outpatient Prescriptions  Medication Sig Dispense Refill  . aspirin EC 81 MG EC tablet Take 1 tablet (81 mg  total) by mouth daily.    . Cholecalciferol (VITAMIN D-3) 1000 units CAPS Take 1 capsule by mouth daily.     . Ferrous Gluconate (IRON 27 PO) Take 27 mg by mouth daily.    . Folic Acid-Vit B6-Vit B12 (FOLBEE) 2.5-25-1 MG TABS tablet Take 1 tablet by mouth daily.    . furosemide (LASIX) 10 MG/ML injection Inject 8 mLs (80 mg total) into the vein once. 8 mL 0  . glucose blood (ONETOUCH VERIO) test strip Use to test blood sugar 2 times daily as instructed. Dx: E11.59 200 each 3  . insulin NPH Human (HUMULIN N) 100 UNIT/ML injection Inject 6 units before b'fast  and another 4 units at bedtime. PENS, PLEASE!! 15 mL 2  . Insulin Pen Needle (CAREFINE PEN NEEDLES) 32G X 4 MM MISC Use 2x a day 100 each 11  . latanoprost (XALATAN) 0.005 % ophthalmic solution Place 1 drop into both eyes at bedtime.    Marland Kitchen levothyroxine (SYNTHROID, LEVOTHROID) 100 MCG tablet Take 1 tablet (100 mcg total) by mouth daily before breakfast. 90 tablet 1  . lipase/protease/amylase (CREON) 36000 UNITS CPEP capsule Take 24,000 Units by mouth 3 (three) times daily before meals.    . Loperamide HCl (IMODIUM PO) Take by mouth at bedtime. 1-2 Tablets @@ bed time    . metolazone (ZAROXOLYN) 2.5 MG tablet Take 1 tablet (2.5 mg total) by mouth daily as needed (For weight gain 3 lbs or more overnight/5 lbs or more in 1 week). 30 tablet 3  . Multiple Vitamins-Minerals (CENTRUM SILVER PO) Take 5 mLs by mouth daily.     . ondansetron (ZOFRAN) 4 MG tablet Take 4 mg by mouth every 8 (eight) hours as needed for nausea or vomiting.    Letta Pate DELICA LANCETS 33G MISC Use to test blood sugar 2 times daily. Dx: E11.59 200 each 3  . oxandrolone (OXANDRIN) 2.5 MG tablet Take 2.5 mg by mouth 2 (two) times daily.     . pantoprazole (PROTONIX) 40 MG tablet Take 40 mg by mouth 2 (two) times daily.    . potassium chloride (K-DUR,KLOR-CON) 10 MEQ tablet Take 4 tablets (40 mEq total) by mouth daily. 60 tablet 6  . thiamine (VITAMIN B-1) 50 MG tablet Take 50 mg by mouth daily.     Marland Kitchen torsemide (DEMADEX) 20 MG tablet Take 2 tablets (40 mg total) by mouth 2 (two) times daily. 120 tablet 6  . vitamin C (ASCORBIC ACID) 500 MG tablet Take 500 mg by mouth daily.     . Zinc 50 MG CAPS Take 45 mg by mouth as directed.      Current Facility-Administered Medications  Medication Dose Route Frequency Provider Last Rate Last Dose  . furosemide (LASIX) injection 80 mg  80 mg Intravenous Once Amy D Filbert Schilder, NP        Vitals:   12/02/16 1213  BP: 110/60  Pulse: 97  SpO2: 100%  Weight: 167 lb (75.8 kg)   Filed Weights    12/02/16 1213  Weight: 167 lb (75.8 kg)    PHYSICAL EXAM: No change from 12/02/2016  General: Chronically ill appearing. Cachetic Pale, NAD. Walked slowly in the clinic with a cane. Daughter present.  HEENT: normal x for temporal wasting. anicteric Neck: supple. JVP flat  Carotids 2+ bilaterally; no bruits. No lymphadenopathy or thryomegaly appreciated. Cor: PMI normal. Regular rate & rhythm. No m/r/g Lungs: decreased at bases. No wheeze Abdomen: soft, nontender, +++distended. No hepatosplenomegaly. No bruits or masses. Good  bowel sounds. LUQ J tube Extremities: no cyanosis, clubbing, rash, R and LLE 3+ extending to his buttocks and scrotum.  Neuro: alert & orientedx3, cranial nerves grossly intact. Moves all 4 extremities w/o difficulty. Affect pleasant.   ASSESSMENT & PLAN: 1. A/C Systolic HF. Suspect R>>>L   Last ECHO 2016. Repeat this week. ECHO set up.  NYHA III. Massive volume overload. I am concerned about right sided heart failure. Offered hospital admit however he adamantly refuses. Poor response from torsemide and metolazone last night. Weight unchanged. Today he was given 80 mg IV lasix in the clinic.   Plan to resume torsemide 40 mg twice a day tomorrow.    Referred to [facility].  2. Hypothryoidism - Diagnosed 10/28/2016. Started on levothyroxine. Followed by PCP.  3. CAD S/P CABG x3 2016- No CP. On aspirin and statin 4. H/O Pancreatitis- 2017 requiring prolonged hospitalization.  5. Stage IV Sacral Pressure Ulcer- Followed by [facility] for wound care. VAC in place.  6. Severe Malnutrition- Most recent albumin 2.1. Has J tube in 2017 7. Ascites- Followed by GI at [facility].Has had paracentesis in the past most recent 11/2015 Needs [place] for possible paracentesis. He wants to hold off. Had CT in March with anasarca noted.  Set up paracentesis.  8. Hypokalemia- K on 11/30/15 2.9. Continue 40 meq potassium daily. Add 25 mg spiro daily.  9. CKD Stage III- recent creatinine 1.7 on 3/25.  BMET next week.  10. Elevated liver function- AST and ALT elevated on 11/29/16 --> Today he was instructed to stop statin.    Follow up next week. Yesterday we discussed  Possible worsening renal failure with aggressive diuresis. May need to pursue Hospice.   Amy Clegg NP-C  11:57 AM  Patient seen and examined with Tonye Becket, NP. We discussed all aspects of the encounter. I agree with the assessment and plan as stated above.   On exam he has marked ascites and peripheral edema. However neck veins are flat. Echo reviewed today in clinic and EF 35-40% with previous inferior MI. However RV normal and filling pressure do not appear elevated. I suspect ascites and edema are related to ascites and low albumin and not primarily HF.   Agree with plan to arrange paracentesis. He received one dose of IV lasix here to help mobilize peripheral edema. Will resume torsemide. Will also need spironolactone. Continue to follow with GI. Can place UNNA boots as needed.  Suspect prognosis is 6 months to a year. We will continue to follow.  Total time spent 45 minutes. Over half that time spent discussing above.   Arvilla Meres, MD  6:39 PM

## 2016-12-03 ENCOUNTER — Other Ambulatory Visit (HOSPITAL_COMMUNITY): Payer: Self-pay

## 2016-12-03 ENCOUNTER — Telehealth (HOSPITAL_COMMUNITY): Payer: Self-pay

## 2016-12-03 NOTE — Telephone Encounter (Signed)
Patient's daughter made aware of scheduled outpatient US paracentesis for therapeutic ascites management at [facility] 9:00 am Wednesday 4/4  Ave Filter, RN

## 2016-12-04 ENCOUNTER — Telehealth (HOSPITAL_COMMUNITY): Payer: Self-pay | Admitting: Cardiology

## 2016-12-04 NOTE — Telephone Encounter (Signed)
-----   Message from Amy D Clegg, NP sent at 12/01/2016  4:52 PM EDT ----- Please call. Lives ensymes elevated. Stop statin.   Thks A 

## 2016-12-07 ENCOUNTER — Telehealth (HOSPITAL_COMMUNITY): Payer: Self-pay | Admitting: *Deleted

## 2016-12-07 NOTE — Telephone Encounter (Signed)
Brita Romp called asking about [facility] orders to start the IV diuretic protocol.  She said that this was ordered at patient's last visit but she still hasn't heard from them.  I called [facility] and spoke with Efraim Kaufmann, RN who said she never received order for patient.  She asked me to send prescription for IV Diuretic protocol to fax number [phone].  Prescription faxed today as requested.

## 2016-12-07 NOTE — Telephone Encounter (Signed)
Patient's daughter Brita Romp called back and she is aware and patient has stopped statin.

## 2016-12-08 ENCOUNTER — Other Ambulatory Visit (HOSPITAL_COMMUNITY): Payer: Self-pay

## 2016-12-09 ENCOUNTER — Encounter (HOSPITAL_COMMUNITY): Payer: Self-pay

## 2016-12-09 ENCOUNTER — Ambulatory Visit (HOSPITAL_COMMUNITY)
Admission: RE | Admit: 2016-12-09 | Discharge: 2016-12-09 | Disposition: A | Discharge status: Home / Self Care | Payer: Medicare Other | Source: Ambulatory Visit | Attending: Adult Health | Admitting: Adult Health

## 2016-12-09 DIAGNOSIS — R188 Other ascites: Secondary | ICD-10-CM

## 2016-12-09 DIAGNOSIS — I509 Heart failure, unspecified: Secondary | ICD-10-CM | POA: Diagnosis not present

## 2016-12-10 ENCOUNTER — Ambulatory Visit (HOSPITAL_COMMUNITY)
Admission: RE | Admit: 2016-12-10 | Discharge: 2016-12-10 | Disposition: A | Discharge status: Home / Self Care | Payer: Medicare Other | Source: Ambulatory Visit | Attending: Internal Medicine | Admitting: Internal Medicine

## 2016-12-10 ENCOUNTER — Encounter (HOSPITAL_COMMUNITY): Payer: Self-pay | Admitting: Internal Medicine

## 2016-12-10 VITALS — BP 124/64 | HR 81 | Wt 153.5 lb

## 2016-12-10 DIAGNOSIS — E785 Hyperlipidemia, unspecified: Secondary | ICD-10-CM | POA: Insufficient documentation

## 2016-12-10 DIAGNOSIS — L89154 Pressure ulcer of sacral region, stage 4: Secondary | ICD-10-CM | POA: Diagnosis not present

## 2016-12-10 DIAGNOSIS — Z7982 Long term (current) use of aspirin: Secondary | ICD-10-CM | POA: Insufficient documentation

## 2016-12-10 DIAGNOSIS — E43 Unspecified severe protein-calorie malnutrition: Secondary | ICD-10-CM | POA: Diagnosis not present

## 2016-12-10 DIAGNOSIS — R188 Other ascites: Secondary | ICD-10-CM | POA: Insufficient documentation

## 2016-12-10 DIAGNOSIS — K7460 Unspecified cirrhosis of liver: Secondary | ICD-10-CM | POA: Insufficient documentation

## 2016-12-10 DIAGNOSIS — I6523 Occlusion and stenosis of bilateral carotid arteries: Secondary | ICD-10-CM | POA: Insufficient documentation

## 2016-12-10 DIAGNOSIS — Z951 Presence of aortocoronary bypass graft: Secondary | ICD-10-CM | POA: Insufficient documentation

## 2016-12-10 DIAGNOSIS — I864 Gastric varices: Secondary | ICD-10-CM | POA: Diagnosis not present

## 2016-12-10 DIAGNOSIS — Z794 Long term (current) use of insulin: Secondary | ICD-10-CM | POA: Diagnosis not present

## 2016-12-10 DIAGNOSIS — I5022 Chronic systolic (congestive) heart failure: Secondary | ICD-10-CM | POA: Diagnosis not present

## 2016-12-10 DIAGNOSIS — I252 Old myocardial infarction: Secondary | ICD-10-CM | POA: Diagnosis not present

## 2016-12-10 DIAGNOSIS — Z86718 Personal history of other venous thrombosis and embolism: Secondary | ICD-10-CM | POA: Diagnosis not present

## 2016-12-10 DIAGNOSIS — I130 Hypertensive heart and chronic kidney disease with heart failure and stage 1 through stage 4 chronic kidney disease, or unspecified chronic kidney disease: Secondary | ICD-10-CM | POA: Diagnosis not present

## 2016-12-10 DIAGNOSIS — I2510 Atherosclerotic heart disease of native coronary artery without angina pectoris: Secondary | ICD-10-CM | POA: Insufficient documentation

## 2016-12-10 DIAGNOSIS — E1122 Type 2 diabetes mellitus with diabetic chronic kidney disease: Secondary | ICD-10-CM | POA: Diagnosis not present

## 2016-12-10 DIAGNOSIS — I428 Other cardiomyopathies: Secondary | ICD-10-CM | POA: Diagnosis not present

## 2016-12-10 DIAGNOSIS — N183 Chronic kidney disease, stage 3 (moderate): Secondary | ICD-10-CM | POA: Insufficient documentation

## 2016-12-10 DIAGNOSIS — K219 Gastro-esophageal reflux disease without esophagitis: Secondary | ICD-10-CM | POA: Diagnosis not present

## 2016-12-10 DIAGNOSIS — E669 Obesity, unspecified: Secondary | ICD-10-CM | POA: Insufficient documentation

## 2016-12-10 DIAGNOSIS — E039 Hypothyroidism, unspecified: Secondary | ICD-10-CM | POA: Diagnosis not present

## 2016-12-10 LAB — COMPREHENSIVE METABOLIC PANEL
ALT: 47 U/L (ref 17–63)
ANION GAP: 8 (ref 5–15)
AST: 61 U/L — ABNORMAL HIGH (ref 15–41)
Albumin: 1.9 g/dL — ABNORMAL LOW (ref 3.5–5.0)
Alkaline Phosphatase: 43 U/L (ref 38–126)
BUN: 37 mg/dL — ABNORMAL HIGH (ref 6–20)
CHLORIDE: 102 mmol/L (ref 101–111)
CO2: 27 mmol/L (ref 22–32)
Calcium: 7.6 mg/dL — ABNORMAL LOW (ref 8.9–10.3)
Creatinine, Ser: 1.95 mg/dL — ABNORMAL HIGH (ref 0.61–1.24)
GFR, EST AFRICAN AMERICAN: 37 mL/min — AB (ref >60)
GFR, EST NON AFRICAN AMERICAN: 32 mL/min — AB (ref >60)
Glucose, Bld: 189 mg/dL — ABNORMAL HIGH (ref 65–99)
POTASSIUM: 4.2 mmol/L (ref 3.5–5.1)
SODIUM: 137 mmol/L (ref 135–145)
Total Bilirubin: 1.3 mg/dL — ABNORMAL HIGH (ref 0.3–1.2)
Total Protein: 7.4 g/dL (ref 6.5–8.1)

## 2016-12-10 LAB — PREALBUMIN: PREALBUMIN: 6.2 mg/dL — AB (ref 18–38)

## 2016-12-10 MED ORDER — SPIRONOLACTONE 25 MG PO TABS: ORAL_TABLET | ORAL | 3 refills | Status: DC

## 2016-12-10 MED ORDER — TORSEMIDE 20 MG PO TABS: ORAL_TABLET | ORAL | 6 refills | Status: DC

## 2016-12-10 NOTE — Progress Notes (Signed)
PCP: Dr Sherryll Burger Primary Cardiologist: Dr Purvis Sheffield GI: [facility] Dr Alycia Rossetti   HPI: [name] is a 76 year old with history CAD, CABG, DVT, ICM, bilateral carotid stenosis, htn, hyperlipidemia, necrotizing pancreatitis, and hypothyroidism.    In January 2017 through March 2017 he had a prolonged hospitalzation pancreatitis. Hospital course complicated by pressure ulcer, malnutrition, and ascites. Required 2 paracentesis and placement of J tube.   Due to ongoing fatigue he was evaluated by his PCP in February of the year. TSH was markedly elevated at 105 on 10/28/16. Started on levothyroxine.   Since the end of January he has had progressive weight gain. (147>163 pounds). Diuretics have been adjusted but he has had poor results. He was seen by Dr Purvis Sheffield on March 6th. BB was stopped at that time.   On March the 25th he was evaluated at [facility] for increased edema. He was given IV lasix and discharged home. Offered hospital admit however he declined.    Today he returns for HF follow up. Underwent paracentesis yesterday with 3.4L out. Weight down 10 pounds to 151. Overall feeling ok. Having a hard time taking potassium. Good urine output. Taking torsemide 40 bid.  Complaining fatigue. Denies orthopnea or PND.  + ab bloating. Appetite fluctuates. No confusion. Still with HHRN. HHPT has ended. Still following with [facility] for sacral wound  Echo 3/18 EF ~40% RV ok  Recent labs 12/01/16  K 3.2 Cr stable 1.7 BNP 223  CT abdomen 11/04/2016  1. 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  2. 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   3. Slight interval increase in the degree of abdominopelvic ascites.  4. There is diffuse small bowel thickening which may relate to hypoproteinemia. There is, however, more marked submucosal edema throughout the colon to the level of the sigmoid. While this could represent portal  colopathy or manifestations of hypoproteinemia, it would be difficult to exclude infectious colitis. Ischemic colitis would seem less likely given the distribution.  5. Anasarca, likely related to hypoproteinemia.  6. Numerous additional findings as described above.    ECHO 2016 EF 45% Echo 12/02/16 EF ~40% RV ok .   Social history:His daughter, Harvest Forest, is a retired PA who worked for the [employer] for 20 years. Lives with his wife.   ROS: All systems negative except as listed in HPI, PMH and Problem List.  SH:  Social History   Social History  . Marital status: Married    Spouse name: N/A  . Number of children: N/A  . Years of education: N/A   Occupational History  . Not on file.   Social History Main Topics  . Smoking status: Never Smoker  . Smokeless tobacco: Never Used  . Alcohol use No  . Drug use: No  . Sexual activity: Yes   Other Topics Concern  . Not on file   Social History Narrative  . No narrative on file    FH:  Family History  Problem Relation Age of Onset  . Heart attack Brother 55  . Pancreatic cancer Sister 83    died in her 58s.     Past Medical History:  Diagnosis Date  . Cataract   . Cholelithiasis   . Diabetes mellitus type 2 in obese (HCC)   . DVT (deep venous thrombosis) (HCC)   . GERD (gastroesophageal reflux disease)   . Hypertension   . MI (myocardial infarction) 01/2015  .  Pancreatitis 01/2015  . Pressure ulcer     Current Outpatient Prescriptions  Medication Sig Dispense Refill  . aspirin EC 81 MG EC tablet Take 1 tablet (81 mg total) by mouth daily.    . Cholecalciferol (VITAMIN D-3) 1000 units CAPS Take 1 capsule by mouth daily.     . Ferrous Gluconate (IRON 27 PO) Take 27 mg by mouth daily.    . Folic Acid-Vit B6-Vit B12 (FOLBEE) 2.5-25-1 MG TABS tablet Take 1 tablet by mouth daily.    Marland Kitchen glucose blood (ONETOUCH VERIO) test strip Use to test blood sugar 2 times daily as instructed. Dx:  E11.59 200 each 3  . insulin NPH Human (HUMULIN N) 100 UNIT/ML injection Inject 6 units before b'fast and another 4 units at bedtime. PENS, PLEASE!! 15 mL 2  . Insulin Pen Needle (CAREFINE PEN NEEDLES) 32G X 4 MM MISC Use 2x a day 100 each 11  . latanoprost (XALATAN) 0.005 % ophthalmic solution Place 1 drop into both eyes at bedtime.    Marland Kitchen levothyroxine (SYNTHROID, LEVOTHROID) 100 MCG tablet Take 1 tablet (100 mcg total) by mouth daily before breakfast. 90 tablet 1  . lipase/protease/amylase (CREON) 36000 UNITS CPEP capsule Take 24,000 Units by mouth 3 (three) times daily before meals.    . Loperamide HCl (IMODIUM PO) Take by mouth at bedtime. 1-2 Tablets @@ bed time    . Multiple Vitamins-Minerals (CENTRUM SILVER PO) Take 5 mLs by mouth daily.     Letta Pate DELICA LANCETS 33G MISC Use to test blood sugar 2 times daily. Dx: E11.59 200 each 3  . oxandrolone (OXANDRIN) 2.5 MG tablet Take 2.5 mg by mouth 2 (two) times daily.     . pantoprazole (PROTONIX) 40 MG tablet Take 40 mg by mouth 2 (two) times daily.    . potassium chloride (K-DUR,KLOR-CON) 10 MEQ tablet Take 4 tablets (40 mEq total) by mouth daily. 60 tablet 6  . thiamine (VITAMIN B-1) 50 MG tablet Take 50 mg by mouth daily.     Marland Kitchen torsemide (DEMADEX) 20 MG tablet Take 2 tablets (40 mg total) by mouth 2 (two) times daily. 120 tablet 6  . vitamin C (ASCORBIC ACID) 500 MG tablet Take 500 mg by mouth daily.     . Zinc 50 MG CAPS Take 45 mg by mouth as directed.     . furosemide (LASIX) 10 MG/ML injection Inject 8 mLs (80 mg total) into the vein once. 8 mL 0  . metolazone (ZAROXOLYN) 2.5 MG tablet Take 1 tablet (2.5 mg total) by mouth daily as needed (For weight gain 3 lbs or more overnight/5 lbs or more in 1 week). (Patient not taking: Reported on 12/10/2016) 30 tablet 3  . ondansetron (ZOFRAN) 4 MG tablet Take 4 mg by mouth every 8 (eight) hours as needed for nausea or vomiting.    Marland Kitchen spironolactone (ALDACTONE) 25 MG tablet Take 1 tablet (25 mg  total) by mouth daily. 30 tablet 3   No current facility-administered medications for this encounter.     Vitals:   12/10/16 1105  BP: 124/64  Pulse: 81  SpO2: 99%  Weight: 153 lb 8 oz (69.6 kg)   Filed Weights   12/10/16 1105  Weight: 153 lb 8 oz (69.6 kg)    PHYSICAL EXAM:  General: Chronically ill appearing. Cachetic.In WC. NAD Daughter present.  HEENT: temporal wasting. anicteric Neck: supple. JVP 8-9 Carotids 2+ bilaterally; no bruits. No lymphadenopathy or thryomegaly appreciated. Cor: PMI normal. Regular rate & rhythm.  No m/r/g  No RV lift Lungs: decreased at bases. No wheexing Abdomen: soft, nontender, ++ distended. No bruits or masses. Good bowel sounds. LUQ J tube Extremities: no cyanosis, clubbing, rash, 2-3+ edema pale Neuro: alert & orientedx3, cranial nerves grossly intact. Moves all 4 extremities w/o difficulty. Affect pleasant.   ASSESSMENT & PLAN: 1. 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 RV ok. No PAH IVC normal --BNP 222 --I do not feel that HF is playing a major role in his volume overload currently. Suspect this is mainly end-stage cirrhosis -- He had paracentesis yesterday with 3.4L off but still with marked ascites  -- Prealbumin is 6 and albumin 1.9 -- He is unable to take Kcl -- Discussed repeat paracentesis but he wants to defer. -- Will increase torsemide to 60/40 and spiro to 25 bid. Stop kcl.  2. Cirrhosis -- Clearly end-stage. Very poor synthetic function -- See changes as above -- Likely has only a few weeks to live  3. CAD S/P CABG x3 2016 - No ischemi. On aspirin and statin  4. Stage IV Sacral Pressure Ulcer -- Followed by [facility] for wound care. VAC in place. Currently nothealing 5. Severe Malnutrition - Prealbumin very low. Has J tube in 2017  6. CKD Stage III - creatinine up to 1.9. Watch closely with diuresis  7. End of life issues - likely had weeks to live. - need to engage Hospice  Total  time spent 45 minutes. Over half that time spent discussing above.   Arvilla Meres, MD  11:57 AM

## 2016-12-10 NOTE — Patient Instructions (Signed)
Increase Torsemide to 60 mg (3 tabs) in AM and 40 mg (2 tabs) in PM  Increase Spironolactone to 50 mg (2 tabs) in AM and 25 mg (1 tab) in PM  STOP POTASSIUM  Labs today  Labs next week, will have Home Health draw these labs  Your physician recommends that you schedule a follow-up appointment in: 3 weeks with Tonye Becket, NP

## 2016-12-10 NOTE — Progress Notes (Signed)
Order for cmet to be drawn on Tue 4/10 and order for [facility] telemonitoring "heart program" faxed to [facility] at [phone]

## 2016-12-11 ENCOUNTER — Telehealth (HOSPITAL_COMMUNITY): Payer: Self-pay | Admitting: *Deleted

## 2016-12-11 ENCOUNTER — Telehealth (HOSPITAL_COMMUNITY): Payer: Self-pay | Admitting: Cardiology

## 2016-12-11 MED ORDER — SPIRONOLACTONE 25 MG PO TABS: 25.0000 mg | ORAL_TABLET | Freq: Two times a day (BID) | ORAL | 3 refills | Status: DC

## 2016-12-11 NOTE — Telephone Encounter (Signed)
Pt aware and agreeable, verbalized understanding

## 2016-12-11 NOTE — Telephone Encounter (Signed)
PT AWARE  

## 2016-12-11 NOTE — Telephone Encounter (Signed)
-----   Message from Sherald Hess, NP sent at 12/01/2016  4:52 PM EDT ----- Please call. Lives ensymes elevated. Stop statin.   Thks A

## 2016-12-11 NOTE — Telephone Encounter (Signed)
-----   Message from Dolores Patty, MD sent at 12/10/2016 11:31 PM EDT ----- Can you drop spiro to 25 bid (not 50/25)

## 2016-12-17 ENCOUNTER — Telehealth (HOSPITAL_COMMUNITY): Payer: Self-pay | Admitting: *Deleted

## 2016-12-17 MED ORDER — SPIRONOLACTONE 25 MG PO TABS: 25.0000 mg | ORAL_TABLET | Freq: Two times a day (BID) | ORAL | 3 refills | Status: DC

## 2016-12-17 NOTE — Telephone Encounter (Signed)
Pts daughter called in to verify med change. Pt told her his torsemide was decreased to 1 tablet bid.  After reviewing heathers phone note I told her that was incorrect. Pt should take spiro 1 tablet bid.  Torsemide should be 3 tabs in the AM and 2 tabs in the PM. Pts daughter Racheal said she would fix his pill box and to make sure we contact her or her sister with medication changes.

## 2016-12-18 ENCOUNTER — Other Ambulatory Visit (HOSPITAL_COMMUNITY): Payer: Self-pay | Admitting: Internal Medicine

## 2016-12-21 ENCOUNTER — Other Ambulatory Visit: Payer: Self-pay | Admitting: Internal Medicine

## 2016-12-21 ENCOUNTER — Telehealth: Payer: Self-pay | Admitting: Internal Medicine

## 2016-12-21 MED ORDER — INSULIN NPH (HUMAN) (ISOPHANE) 100 UNIT/ML ~~LOC~~ SUSP: SUBCUTANEOUS | 2 refills | Status: DC

## 2016-12-21 NOTE — Telephone Encounter (Signed)
Need a prescription for the Pen needles to go on insulin pens.

## 2016-12-22 MED ORDER — INSULIN PEN NEEDLE 32G X 4 MM MISC: 11 refills | Status: AC

## 2016-12-22 NOTE — Telephone Encounter (Signed)
Refill for pen needles submitted to [facility].

## 2016-12-24 ENCOUNTER — Telehealth: Payer: Self-pay | Admitting: Internal Medicine

## 2016-12-24 NOTE — Telephone Encounter (Signed)
Pt's daughter called in and requested that the patient's thyroid lab orders be sent to [facility] at [phone]

## 2016-12-28 ENCOUNTER — Encounter (HOSPITAL_COMMUNITY): Payer: Self-pay

## 2016-12-28 ENCOUNTER — Telehealth (HOSPITAL_COMMUNITY): Payer: Self-pay

## 2016-12-28 DIAGNOSIS — R188 Other ascites: Secondary | ICD-10-CM

## 2016-12-28 NOTE — Telephone Encounter (Signed)
Faxed over lab work to [facility].

## 2016-12-28 NOTE — Telephone Encounter (Signed)
Patient's daughter called to ask about paracentesis.  States she talked to [facility] about this. No phone note about this, will forward to Ernesta Amble RN to follow up tomorrow.  Ave Filter, RN

## 2016-12-29 ENCOUNTER — Encounter (HOSPITAL_COMMUNITY): Payer: Self-pay

## 2016-12-29 NOTE — Telephone Encounter (Addendum)
Called Lori (pt's Daughter) back this morning and discussed ording Paracentesis.  Dr. Gala Romney will order standing order for US Paracentesis but he said no Albumin is needed.  Patient may have procedure done up to once/week.    Order placed and patient is scheduled at [facility] May 2nd @ 9:00 am.

## 2016-12-30 ENCOUNTER — Other Ambulatory Visit: Payer: Self-pay

## 2017-01-01 NOTE — Telephone Encounter (Signed)
Patient's daughter called back and I spoke with her. She stated on 01/04/2017 the patient is having an abdominal US and will have to be fasting for the test. She wanted to verify what the insulin dosage should be the day of the test. She stated the test was at 11 am. I spoke with Dr. Elvera Lennox verbally on this and she stated for him to skip the morning dosage of insulin. She voiced understanding and had no further questions at this time.

## 2017-01-01 NOTE — Telephone Encounter (Signed)
LMOM for [name] [name] about [name] on questions about his medication. Spoke with [name] [name] and he wanted [place] to discuss about medication with daughter

## 2017-01-01 NOTE — Telephone Encounter (Signed)
[name] [name], can you please see what the Q is >> you can get me out of the room if needed.

## 2017-01-01 NOTE — Telephone Encounter (Signed)
See message and please advise, Thanks!  

## 2017-01-01 NOTE — Telephone Encounter (Signed)
Patient wife has a question about how patient should take his insulin, after his surgery. Please advise  Cal wife [phone]

## 2017-01-05 ENCOUNTER — Ambulatory Visit (HOSPITAL_COMMUNITY)
Admission: RE | Admit: 2017-01-05 | Discharge: 2017-01-05 | Disposition: A | Discharge status: Home / Self Care | Payer: Medicare Other | Source: Ambulatory Visit | Attending: Internal Medicine | Admitting: Internal Medicine

## 2017-01-05 ENCOUNTER — Telehealth (HOSPITAL_COMMUNITY): Payer: Self-pay | Admitting: Cardiology

## 2017-01-05 ENCOUNTER — Encounter (HOSPITAL_COMMUNITY): Payer: Self-pay | Admitting: Internal Medicine

## 2017-01-05 VITALS — BP 96/54 | HR 56 | Wt 151.2 lb

## 2017-01-05 DIAGNOSIS — Z800 Family history of malignant neoplasm of digestive organs: Secondary | ICD-10-CM | POA: Diagnosis not present

## 2017-01-05 DIAGNOSIS — K219 Gastro-esophageal reflux disease without esophagitis: Secondary | ICD-10-CM | POA: Diagnosis not present

## 2017-01-05 DIAGNOSIS — I130 Hypertensive heart and chronic kidney disease with heart failure and stage 1 through stage 4 chronic kidney disease, or unspecified chronic kidney disease: Secondary | ICD-10-CM | POA: Diagnosis not present

## 2017-01-05 DIAGNOSIS — R601 Generalized edema: Secondary | ICD-10-CM | POA: Diagnosis not present

## 2017-01-05 DIAGNOSIS — K7460 Unspecified cirrhosis of liver: Secondary | ICD-10-CM | POA: Diagnosis not present

## 2017-01-05 DIAGNOSIS — I255 Ischemic cardiomyopathy: Secondary | ICD-10-CM | POA: Diagnosis not present

## 2017-01-05 DIAGNOSIS — E1122 Type 2 diabetes mellitus with diabetic chronic kidney disease: Secondary | ICD-10-CM | POA: Diagnosis not present

## 2017-01-05 DIAGNOSIS — Z6821 Body mass index (BMI) 21.0-21.9, adult: Secondary | ICD-10-CM | POA: Insufficient documentation

## 2017-01-05 DIAGNOSIS — Z794 Long term (current) use of insulin: Secondary | ICD-10-CM | POA: Insufficient documentation

## 2017-01-05 DIAGNOSIS — I10 Essential (primary) hypertension: Secondary | ICD-10-CM

## 2017-01-05 DIAGNOSIS — I252 Old myocardial infarction: Secondary | ICD-10-CM | POA: Insufficient documentation

## 2017-01-05 DIAGNOSIS — K8689 Other specified diseases of pancreas: Secondary | ICD-10-CM | POA: Diagnosis not present

## 2017-01-05 DIAGNOSIS — Z79899 Other long term (current) drug therapy: Secondary | ICD-10-CM | POA: Diagnosis not present

## 2017-01-05 DIAGNOSIS — L89154 Pressure ulcer of sacral region, stage 4: Secondary | ICD-10-CM | POA: Diagnosis not present

## 2017-01-05 DIAGNOSIS — I2510 Atherosclerotic heart disease of native coronary artery without angina pectoris: Secondary | ICD-10-CM | POA: Diagnosis not present

## 2017-01-05 DIAGNOSIS — I5022 Chronic systolic (congestive) heart failure: Secondary | ICD-10-CM | POA: Diagnosis present

## 2017-01-05 DIAGNOSIS — R600 Localized edema: Secondary | ICD-10-CM | POA: Diagnosis not present

## 2017-01-05 DIAGNOSIS — E43 Unspecified severe protein-calorie malnutrition: Secondary | ICD-10-CM | POA: Diagnosis not present

## 2017-01-05 DIAGNOSIS — Z86718 Personal history of other venous thrombosis and embolism: Secondary | ICD-10-CM | POA: Insufficient documentation

## 2017-01-05 DIAGNOSIS — Z8249 Family history of ischemic heart disease and other diseases of the circulatory system: Secondary | ICD-10-CM | POA: Diagnosis not present

## 2017-01-05 DIAGNOSIS — Z7982 Long term (current) use of aspirin: Secondary | ICD-10-CM | POA: Insufficient documentation

## 2017-01-05 DIAGNOSIS — N183 Chronic kidney disease, stage 3 (moderate): Secondary | ICD-10-CM | POA: Insufficient documentation

## 2017-01-05 DIAGNOSIS — Z951 Presence of aortocoronary bypass graft: Secondary | ICD-10-CM | POA: Diagnosis not present

## 2017-01-05 DIAGNOSIS — R188 Other ascites: Secondary | ICD-10-CM

## 2017-01-05 DIAGNOSIS — L98429 Non-pressure chronic ulcer of back with unspecified severity: Secondary | ICD-10-CM | POA: Diagnosis not present

## 2017-01-05 MED ORDER — TORSEMIDE 20 MG PO TABS: ORAL_TABLET | ORAL | 6 refills | Status: DC

## 2017-01-05 MED ORDER — FUROSEMIDE 10 MG/ML PO SOLN: 80.0000 mg | Freq: Every day | ORAL | 0 refills | Status: DC

## 2017-01-05 MED ORDER — POTASSIUM CHLORIDE 20 MEQ/15ML (10%) PO SOLN: 40.0000 meq | Freq: Every day | ORAL | 0 refills | Status: DC

## 2017-01-05 MED FILL — POTASSIUM CL 20% (40 MEQ/15: 40 MEQ/15ML | 15 days supply | Qty: 225 | Fill #0

## 2017-01-05 MED FILL — FUROSEMIDE 10 MG/ML SOLN: 10 | 3 days supply | Qty: 24 | Fill #0

## 2017-01-05 NOTE — Telephone Encounter (Signed)
Order faxed to [facility] [phone]  Verbal order given to Yates Decamp [phone]  Patient to receive 80 mg IV lasix x 3 days, will hold PM dose of torsemide on IV lasix days  (order for LASIX solution sent to [facility] outpatient pharmacy to ensure medication is available at the time nurse is sent out)  Patient to receive 2.5 mg of metolazone x 3 days  Patient to receive 40 meq of potassium solution (30 mL) X 3 days   Return to clinic x 2 weeks for follow up with Dr Gala Romney as scheduled

## 2017-01-05 NOTE — Patient Instructions (Signed)
Home Health to administer 80 mg of IV Lasix x 3 days Please take (30 mL) of Potassium for x 3 days Take Metolazone 2.5 mg for 3 days  Your physician recommends that you schedule a follow-up appointment in: 2 weeks with Dr Gala Romney   Do the following things EVERYDAY: 1) Weigh yourself in the morning before breakfast. Write it down and keep it in a log. 2) Take your medicines as prescribed 3) Eat low salt foods-Limit salt (sodium) to 2000 mg per day.  4) Stay as active as you can everyday 5) Limit all fluids for the day to less than 2 liters

## 2017-01-05 NOTE — Progress Notes (Signed)
Advanced Heart Failure Medication Review by a Pharmacist  Does the patient  feel that his/her medications are working for him/her?  yes  Has the patient been experiencing any side effects to the medications prescribed?  no  Does the patient measure his/her own blood pressure or blood glucose at home?  yes   Does the patient have any problems obtaining medications due to transportation or finances?   no  Understanding of regimen: good Understanding of indications: good Potential of compliance: good Patient understands to avoid NSAIDs. Patient understands to avoid decongestants.  Issues to address at subsequent visits: None   Pharmacist comments: Mr. Jaquish is a pleasant 76 yo M presenting with his daughter and a current medication list. They report good compliance with his regimen. He does have difficulty swallowing most of his medications and prefers chewable vitamins. They were wondering if I knew of a chewable vitamin A. I found one through [employer] and will advise the patient and his daughter. No other medication-related questions or concerns for me at this time.   Tyler Deis. Bonnye Fava, PharmD, BCPS, CPP Clinical Pharmacist Pager: [phone] Phone: [phone] 01/05/2017 3:10 PM      Time with patient: 10 minutes Preparation and documentation time: 10 minutes Total time: 20 minutes

## 2017-01-05 NOTE — Progress Notes (Signed)
PCP: Dr Sherryll Burger Primary Cardiologist: Dr Purvis Sheffield GI: [facility] Dr Alycia Rossetti   HPI: [name] [name] is a 76 year old with history CAD, CABG, DVT, ICM, bilateral carotid stenosis, HTN, hyperlipidemia, necrotizing pancreatitis and cirrhosis.    In January 2017 through March 2017 he had a prolonged hospitalzation due to pancreatitis. Hospital course complicated by pressure ulcer, malnutrition, and ascites. Required 2 paracentesis and placement of J tube.   Due to ongoing fatigue he was evaluated by his PCP in February of the year. TSH was markedly elevated at 105 on 10/28/16. Started on levothyroxine.   He has struggled with severe volume overload which is likely multi-factorial. Echo in 3/18 showed EF 40% with normal RV and no evidence of pulmonary HTN. Has recently been seen by Dr. 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 However was told that he does not have liver failure.   He returns today for follow up.  Pt underwent paracentesis yesterday at [facility] with 2 L. He was told that there were multiple pockets of fluid but that was the largest.  Appetite remains very poor. Very fatigued. Can't get around because of all the swelling in his legs and the abdominal distention. Denies orthopnea or PND. No CP.  Uses a WC to get around when he is feeling week. More often now per his daughter. Has HHRN, but no PT.  Still following with [facility] for sacral wound.   Echo 3/18 EF ~40% RV ok  Recent labs 12/01/16  K 3.2 Cr stable 1.7 BNP 223  CT abdomen 11/04/2016  1. 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  2. 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   3. Slight interval increase in the degree of abdominopelvic ascites.  4. There is diffuse small bowel thickening which  may relate to hypoproteinemia. There is, however, more marked submucosal edema throughout the colon to the level of the sigmoid. While this could represent portal colopathy or manifestations of hypoproteinemia, it would be difficult to exclude infectious colitis. Ischemic colitis would seem less likely given the distribution.  5. Anasarca, likely related to hypoproteinemia.  6. Numerous additional findings as described above.    ECHO 2016 EF 45% Echo 12/02/16 EF ~40% RV ok .   Social history:His daughter, Harvest Forest, is a retired PA who worked for the [employer] for 20 years. Lives with his wife.   ROS: All systems negative except as listed in HPI, PMH and Problem List.  SH:  Social History   Social History  . Marital status: Married    Spouse name: N/A  . Number of children: N/A  . Years of education: N/A   Occupational History  . Not on file.   Social History Main Topics  . Smoking status: Never Smoker  . Smokeless tobacco: Never Used  . Alcohol use No  . Drug use: No  . Sexual activity: Yes   Other Topics Concern  . Not on file   Social History Narrative  . No narrative on file    FH:  Family History  Problem Relation Age of Onset  . Heart attack Brother 55  . Pancreatic cancer Sister 66    died in her 21s.     Past Medical History:  Diagnosis Date  . Cataract   . Cholelithiasis   .  Diabetes mellitus type 2 in obese (HCC)   . DVT (deep venous thrombosis) (HCC)   . GERD (gastroesophageal reflux disease)   . Hypertension   . MI (myocardial infarction) (HCC) 01/2015  . Pancreatitis 01/2015  . Pressure ulcer     Current Outpatient Prescriptions  Medication Sig Dispense Refill  . aspirin EC 81 MG EC tablet Take 1 tablet (81 mg total) by mouth daily.    . Cholecalciferol (VITAMIN D-3) 1000 units CAPS Take 3,000 Units by mouth daily.     . Ferrous Gluconate (IRON 27 PO) Take 27 mg by mouth daily.    . Folic Acid-Vit B6-Vit B12  (FOLBEE) 2.5-25-1 MG TABS tablet Take 1 tablet by mouth daily.    Marland Kitchen glucose blood (ONETOUCH VERIO) test strip Use to test blood sugar 2 times daily as instructed. Dx: E11.59 200 each 3  . insulin NPH Human (HUMULIN N) 100 UNIT/ML injection Inject 6 units before b'fast and another 4 units at bedtime. PENS, PLEASE!! 15 mL 2  . Insulin Pen Needle (CAREFINE PEN NEEDLES) 32G X 4 MM MISC Use 2x a day 100 each 11  . latanoprost (XALATAN) 0.005 % ophthalmic solution Place 1 drop into both eyes at bedtime.    Marland Kitchen levothyroxine (SYNTHROID, LEVOTHROID) 100 MCG tablet Take 1 tablet (100 mcg total) by mouth daily before breakfast. 90 tablet 1  . lipase/protease/amylase (CREON) 36000 UNITS CPEP capsule Take 24,000 Units by mouth 3 (three) times daily before meals.    . Loperamide HCl (IMODIUM PO) Take by mouth at bedtime. 1-2 Tablets @@ bed time    . Multiple Vitamins-Minerals (CENTRUM SILVER PO) Take 2 tablets by mouth daily.     Letta Pate DELICA LANCETS 33G MISC Use to test blood sugar 2 times daily. Dx: E11.59 200 each 3  . oxandrolone (OXANDRIN) 2.5 MG tablet Take 2.5 mg by mouth 2 (two) times daily.     . pantoprazole (PROTONIX) 40 MG tablet Take 40 mg by mouth 2 (two) times daily.    Marland Kitchen spironolactone (ALDACTONE) 25 MG tablet Take 50 mg by mouth daily.    Marland Kitchen thiamine (VITAMIN B-1) 50 MG tablet Take 50 mg by mouth daily.     Marland Kitchen torsemide (DEMADEX) 20 MG tablet Take 3 tabs in AM and 2 tabs in PM 150 tablet 6  . vitamin A 16109 UNIT capsule Take 10,000 Units by mouth daily.    . vitamin C (ASCORBIC ACID) 500 MG tablet Take 500 mg by mouth daily.     . Zinc 50 MG CAPS Take 45 mg by mouth as directed.     . metolazone (ZAROXOLYN) 2.5 MG tablet Take 1 tablet (2.5 mg total) by mouth daily as needed (For weight gain 3 lbs or more overnight/5 lbs or more in 1 week). (Patient not taking: Reported on 12/10/2016) 30 tablet 3  . ondansetron (ZOFRAN) 4 MG tablet Take 4 mg by mouth every 8 (eight) hours as needed for nausea  or vomiting.     No current facility-administered medications for this encounter.    Vitals:   01/05/17 1434  BP: (!) 96/54  Pulse: (!) 56  SpO2: 96%  Weight: 151 lb 3.2 oz (68.6 kg)   Wt Readings from Last 3 Encounters:  01/05/17 151 lb 3.2 oz (68.6 kg)  12/10/16 153 lb 8 oz (69.6 kg)  12/02/16 167 lb (75.8 kg)    PHYSICAL EXAM:  General: Chronically ill appearing. Cachetic and fatigued apeparing. In WC. Daughter present.  HEENT: Temporal  wasting. Anicteric.  Neck: Thin JVP to jaw. Carotids 2+ bilat; no bruits. No thyromegaly or nodule noted. Cor: PMI normal. RRR. No M/G/R noted. No RV lift.  Lungs: Diminished basilar sounds with scant crackles.  Abdomen: soft, mildly tender, markedly distended. LUQ J tube.   Extremities: 3+ edema into thighs. Pale.   Neuro: alert & orientedx3, cranial nerves grossly intact. moves all 4 extremities w/o difficulty. Affect pleasant   ASSESSMENT & PLAN: 1. 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 RV ok. No PAH IVC normal -- Presenting symptoms likely 2/2 end-stage cirrhosis.  -- Had paracentesis yesterday with 2 L out. Marked ascites remains.   -- Prealbumin is 6 and albumin 1.9 at last visit.  -- He is unable to take Kcl with dysphagia -- Having recurrent paracentesis with minimal and ephemeral relief.  -- [facility] give 80 mg IV lasix, 2.5 mg metolazone, and 40 meq of potassium solution x 3 days.  Then resume torsemide 60 mg am and 40 mg pm.  -- Continue spiro 50 mg daily.  2. Cirrhosis -- likely multifactorial -- Prognosis extremely poor.  3. CAD S/P CABG x3 2016  - No ischemic symptoms. Cont ASA and statin for now.    4. Stage IV Sacral Pressure Ulcer -- Followed by [facility] for wound care. VAC in place. Poorly healing.  No change from our perspective.   5. Severe Malnutrition in setting of pancreatic insufficiency - Prealbumin very low. Has J tube in 2017  6. CKD Stage III - BMET  today.  7. End of life issues - Pt clearly with markedly poor prognosis. Suspects weeks to live. He is losing weight but maintaining fluid and is nearly immobile. Need to engage hospice.   Lasix + metolazone + K x 3 days. Will follow up 2 weeks to check on his fluid status.  Prognosis extremely poor.   Graciella Freer, PA-C  3:17 PM   Patient seen and examined with the above-signed Advanced Practice Provider and/or Housestaff. I personally reviewed laboratory data, imaging studies and relevant notes. I independently examined the patient and formulated the important aspects of the plan. I have edited the note to reflect any of my changes or salient points. I have personally discussed the plan with the patient and/or family.  [name]. Matty continues to deteriorate with progressive anasarca in the setting of pancreatic insufficiency, cirrhosis and severe hypoproteinemia. I do not think his HF is playing a major role here. Given degree of edema and his discomfort, I once again offered him admission for attempted diuresis for comfort and perhaps to help GI absorption. He refused this multiple times. I also raised the issue of Hospice Care but he refused this as well. The best I could do for him was to arrange for [facility] to give IV lasix and metolazone daily x 3 days to see if this will help relieve some of his volume overload.  Total time spent 45 minutes. Over half that time spent discussing above.   Arvilla Meres, MD  10:43 PM

## 2017-01-06 ENCOUNTER — Ambulatory Visit (HOSPITAL_COMMUNITY): Admission: RE | Admit: 2017-01-06 | Payer: Medicare Other | Source: Ambulatory Visit

## 2017-01-06 MED FILL — FUROSEMIDE 20 MG/2 ML VIAL: 10 | 3 days supply | Qty: 24 | Fill #0

## 2017-01-20 ENCOUNTER — Encounter (HOSPITAL_COMMUNITY): Payer: Self-pay | Admitting: Internal Medicine

## 2017-01-20 ENCOUNTER — Telehealth: Payer: Self-pay | Admitting: Internal Medicine

## 2017-01-20 ENCOUNTER — Telehealth: Payer: Self-pay

## 2017-01-20 ENCOUNTER — Other Ambulatory Visit: Payer: Self-pay

## 2017-01-20 ENCOUNTER — Ambulatory Visit (HOSPITAL_COMMUNITY)
Admission: RE | Admit: 2017-01-20 | Discharge: 2017-01-20 | Disposition: A | Discharge status: Home / Self Care | Payer: Medicare Other | Source: Ambulatory Visit | Attending: Internal Medicine | Admitting: Internal Medicine

## 2017-01-20 VITALS — BP 110/78 | HR 56 | Ht 71.0 in | Wt 138.5 lb

## 2017-01-20 DIAGNOSIS — I255 Ischemic cardiomyopathy: Secondary | ICD-10-CM | POA: Insufficient documentation

## 2017-01-20 DIAGNOSIS — K7460 Unspecified cirrhosis of liver: Secondary | ICD-10-CM | POA: Insufficient documentation

## 2017-01-20 DIAGNOSIS — Z8249 Family history of ischemic heart disease and other diseases of the circulatory system: Secondary | ICD-10-CM | POA: Diagnosis not present

## 2017-01-20 DIAGNOSIS — I2510 Atherosclerotic heart disease of native coronary artery without angina pectoris: Secondary | ICD-10-CM | POA: Diagnosis not present

## 2017-01-20 DIAGNOSIS — L89154 Pressure ulcer of sacral region, stage 4: Secondary | ICD-10-CM | POA: Diagnosis not present

## 2017-01-20 DIAGNOSIS — Z794 Long term (current) use of insulin: Secondary | ICD-10-CM | POA: Insufficient documentation

## 2017-01-20 DIAGNOSIS — Z86718 Personal history of other venous thrombosis and embolism: Secondary | ICD-10-CM | POA: Insufficient documentation

## 2017-01-20 DIAGNOSIS — R600 Localized edema: Secondary | ICD-10-CM | POA: Diagnosis not present

## 2017-01-20 DIAGNOSIS — Z7982 Long term (current) use of aspirin: Secondary | ICD-10-CM | POA: Insufficient documentation

## 2017-01-20 DIAGNOSIS — E039 Hypothyroidism, unspecified: Secondary | ICD-10-CM | POA: Diagnosis not present

## 2017-01-20 DIAGNOSIS — E785 Hyperlipidemia, unspecified: Secondary | ICD-10-CM | POA: Diagnosis not present

## 2017-01-20 DIAGNOSIS — E1122 Type 2 diabetes mellitus with diabetic chronic kidney disease: Secondary | ICD-10-CM | POA: Diagnosis not present

## 2017-01-20 DIAGNOSIS — I5022 Chronic systolic (congestive) heart failure: Secondary | ICD-10-CM | POA: Diagnosis present

## 2017-01-20 DIAGNOSIS — N183 Chronic kidney disease, stage 3 (moderate): Secondary | ICD-10-CM | POA: Insufficient documentation

## 2017-01-20 DIAGNOSIS — H269 Unspecified cataract: Secondary | ICD-10-CM | POA: Insufficient documentation

## 2017-01-20 DIAGNOSIS — I130 Hypertensive heart and chronic kidney disease with heart failure and stage 1 through stage 4 chronic kidney disease, or unspecified chronic kidney disease: Secondary | ICD-10-CM | POA: Insufficient documentation

## 2017-01-20 DIAGNOSIS — I5032 Chronic diastolic (congestive) heart failure: Secondary | ICD-10-CM | POA: Diagnosis not present

## 2017-01-20 DIAGNOSIS — I252 Old myocardial infarction: Secondary | ICD-10-CM | POA: Insufficient documentation

## 2017-01-20 DIAGNOSIS — Z800 Family history of malignant neoplasm of digestive organs: Secondary | ICD-10-CM | POA: Diagnosis not present

## 2017-01-20 DIAGNOSIS — I6523 Occlusion and stenosis of bilateral carotid arteries: Secondary | ICD-10-CM | POA: Diagnosis not present

## 2017-01-20 DIAGNOSIS — E43 Unspecified severe protein-calorie malnutrition: Secondary | ICD-10-CM | POA: Insufficient documentation

## 2017-01-20 DIAGNOSIS — Z951 Presence of aortocoronary bypass graft: Secondary | ICD-10-CM | POA: Diagnosis not present

## 2017-01-20 DIAGNOSIS — K219 Gastro-esophageal reflux disease without esophagitis: Secondary | ICD-10-CM | POA: Diagnosis not present

## 2017-01-20 DIAGNOSIS — E1169 Type 2 diabetes mellitus with other specified complication: Secondary | ICD-10-CM

## 2017-01-20 LAB — BASIC METABOLIC PANEL
ANION GAP: 10 (ref 5–15)
BUN: 33 mg/dL — ABNORMAL HIGH (ref 6–20)
CHLORIDE: 98 mmol/L — AB (ref 101–111)
CO2: 26 mmol/L (ref 22–32)
Calcium: 7.8 mg/dL — ABNORMAL LOW (ref 8.9–10.3)
Creatinine, Ser: 1.76 mg/dL — ABNORMAL HIGH (ref 0.61–1.24)
GFR calc Af Amer: 42 mL/min — ABNORMAL LOW (ref >60)
GFR, EST NON AFRICAN AMERICAN: 36 mL/min — AB (ref >60)
GLUCOSE: 240 mg/dL — AB (ref 65–99)
POTASSIUM: 4.7 mmol/L (ref 3.5–5.1)
Sodium: 134 mmol/L — ABNORMAL LOW (ref 135–145)

## 2017-01-20 MED ORDER — LEVOTHYROXINE SODIUM 125 MCG PO TABS: ORAL_TABLET | ORAL | 3 refills | Status: DC

## 2017-01-20 MED ORDER — LEVOTHYROXINE SODIUM 125 MCG PO TABS: ORAL_TABLET | ORAL | 3 refills | Status: AC

## 2017-01-20 MED FILL — LEVOTHYROXINE 125 MCG TABLE: 125 | 90 days supply | Qty: 90 | Fill #0

## 2017-01-20 NOTE — Telephone Encounter (Signed)
He has an appointment with you June 11th. Would this be okay?

## 2017-01-20 NOTE — Telephone Encounter (Signed)
Called and notified daughter of changes, and we would repeat labs when he comes for an appointment.  No questions at this time.

## 2017-01-20 NOTE — Telephone Encounter (Signed)
Resubmitted to the pharmacy of choice.

## 2017-01-20 NOTE — Telephone Encounter (Signed)
Called and notified daughter of changes, and we would repeat labs when he comes for an appointment.  No questions at this time.   

## 2017-01-20 NOTE — Telephone Encounter (Signed)
Pt's daughter called in and requested the Levothyroxine be resubmitted to the [facility] in [place].

## 2017-01-20 NOTE — Progress Notes (Signed)
PCP: Dr Sherryll Burger Primary Cardiologist: Dr Purvis Sheffield GI: [facility] Dr Alycia Rossetti   HPI: [name] is a 76 year old with history CAD, CABG, DVT, ICM, bilateral carotid stenosis, htn, hyperlipidemia, necrotizing pancreatitis, and hypothyroidism.    In January 2017 through March 2017 he had a prolonged hospitalzation pancreatitis. Hospital course complicated by pressure ulcer, malnutrition, and ascites. Required 2 paracentesis and placement of J tube.   Due to ongoing fatigue he was evaluated by his PCP in February of the year. TSH was markedly elevated at 105 on 10/28/16. Started on levothyroxine.   Since the end of January he has had progressive weight gain. (147>163 pounds). Diuretics have been adjusted but he has had poor results. He was seen by Dr Purvis Sheffield on March 6th. BB was stopped at that time.   On March the 25th he was evaluated at [facility] for increased edema. He was given IV lasix and discharged home. Offered hospital admit however he declined.    He returns today for CHF follow up. Overall feeling ok. Appetite improving, not SOB at rest. No SOB with getting dressed or bathing. Can walk into clinic without SOB. Denies orthopnea. Still with some lower extremity edema. Weights at home 136-138 pounds. He is taking torsemide once a day most days, he does not like to get up at night to urinate.   Echo 3/18 EF ~40% RV ok  Recent labs 12/01/16  K 3.2 Cr stable 1.7 BNP 223  CT abdomen 11/04/2016  1. 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  2. 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   3. Slight interval increase in the degree of abdominopelvic ascites.  4. There is diffuse small bowel thickening which may relate to hypoproteinemia. There is, however, more marked submucosal edema throughout the colon to the level of the sigmoid. While this could represent portal colopathy or manifestations of  hypoproteinemia, it would be difficult to exclude infectious colitis. Ischemic colitis would seem less likely given the distribution.  5. Anasarca, likely related to hypoproteinemia.  6. Numerous additional findings as described above.    ECHO 2016 EF 45% Echo 12/02/16 EF ~40% RV ok .   Social history:His daughter, Harvest Forest, is a retired PA who worked for the [employer] for 20 years. Lives with his wife.   ROS: All systems negative except as listed in HPI, PMH and Problem List.  SH:  Social History   Social History  . Marital status: Married    Spouse name: N/A  . Number of children: N/A  . Years of education: N/A   Occupational History  . Not on file.   Social History Main Topics  . Smoking status: Never Smoker  . Smokeless tobacco: Never Used  . Alcohol use No  . Drug use: No  . Sexual activity: Yes   Other Topics Concern  . Not on file   Social History Narrative  . No narrative on file    FH:  Family History  Problem Relation Age of Onset  . Heart attack Brother 55  . Pancreatic cancer Sister 92       died in her 48s.     Past Medical History:  Diagnosis Date  . Cataract   . Cholelithiasis   . Diabetes mellitus type 2 in obese (HCC)   . DVT (deep venous thrombosis) (HCC)   . GERD (gastroesophageal reflux disease)   . Hypertension   . MI (myocardial  infarction) (HCC) 01/2015  . Pancreatitis 01/2015  . Pressure ulcer     Current Outpatient Prescriptions  Medication Sig Dispense Refill  . aspirin EC 81 MG EC tablet Take 1 tablet (81 mg total) by mouth daily.    . Cholecalciferol (VITAMIN D-3) 1000 units CAPS Take 3,000 Units by mouth daily.     . Ferrous Gluconate (IRON 27 PO) Take 27 mg by mouth daily.    . Folic Acid-Vit B6-Vit B12 (FOLBEE) 2.5-25-1 MG TABS tablet Take 1 tablet by mouth daily.    Marland Kitchen glucose blood (ONETOUCH VERIO) test strip Use to test blood sugar 2 times daily as instructed. Dx: E11.59 200 each 3  .  insulin NPH Human (HUMULIN N) 100 UNIT/ML injection Inject 6 units before b'fast and another 4 units at bedtime. PENS, PLEASE!! 15 mL 2  . Insulin Pen Needle (CAREFINE PEN NEEDLES) 32G X 4 MM MISC Use 2x a day 100 each 11  . latanoprost (XALATAN) 0.005 % ophthalmic solution Place 1 drop into both eyes at bedtime.    Marland Kitchen levothyroxine (SYNTHROID, LEVOTHROID) 125 MCG tablet Take one tablet by mouth daily 90 tablet 3  . lipase/protease/amylase (CREON) 36000 UNITS CPEP capsule Take 24,000 Units by mouth 3 (three) times daily before meals.    . Loperamide HCl (IMODIUM PO) Take by mouth at bedtime. 1-2 Tablets @@ bed time    . metolazone (ZAROXOLYN) 2.5 MG tablet Take 1 tablet (2.5 mg total) by mouth daily as needed (For weight gain 3 lbs or more overnight/5 lbs or more in 1 week). 30 tablet 3  . Multiple Vitamins-Minerals (CENTRUM SILVER PO) Take 2 tablets by mouth daily.     . ondansetron (ZOFRAN) 4 MG tablet Take 4 mg by mouth every 8 (eight) hours as needed for nausea or vomiting.    Letta Pate DELICA LANCETS 33G MISC Use to test blood sugar 2 times daily. Dx: E11.59 200 each 3  . oxandrolone (OXANDRIN) 2.5 MG tablet Take 2.5 mg by mouth 2 (two) times daily.     . pantoprazole (PROTONIX) 40 MG tablet Take 40 mg by mouth 2 (two) times daily.    . potassium chloride 20 MEQ/15ML (10%) SOLN Take 30 mLs (40 mEq total) by mouth daily. 450 mL 0  . spironolactone (ALDACTONE) 25 MG tablet Take 50 mg by mouth daily.    Marland Kitchen thiamine (VITAMIN B-1) 50 MG tablet Take 50 mg by mouth daily.     Marland Kitchen torsemide (DEMADEX) 20 MG tablet Take 3 tabs in AM and 2 tabs in PM 150 tablet 6  . vitamin A 16109 UNIT capsule Take 10,000 Units by mouth daily.    . vitamin C (ASCORBIC ACID) 500 MG tablet Take 500 mg by mouth daily.     . Zinc 50 MG CAPS Take 45 mg by mouth as directed.     . furosemide (LASIX) 10 MG/ML solution Take 8 mLs (80 mg total) by mouth daily. 24 mL 0   No current facility-administered medications for this  encounter.     Vitals:   01/20/17 1414  BP: 110/78  Pulse: (!) 56  SpO2: 96%  Weight: 138 lb 8 oz (62.8 kg)  Height: 5\' 11"  (1.803 m)   Filed Weights   01/20/17 1414  Weight: 138 lb 8 oz (62.8 kg)    PHYSICAL EXAM:  General: Cachetic appearing male. Ambulated into clinic with cane. NAD.  HEENT: temporal wasting.  Neck: supple.JVP 5-6 cm.  Carotids 2+ bilaterally; no bruits. No  lymphadenopathy or thryomegaly appreciated. Cor: PMI normal. Regular rate and rhythm. No m/r/g  No RV lift Lungs: Clear bilaterally in upper lobes bilaterally. Diminished in bases.  Abdomen: soft, nontender, mildly distended. No bruits or masses. Good bowel sounds. LUQ J tube.  Extremities: no cyanosis, clubbing, rash, 2+ pedal edema.  Neuro: alert & orientedx3, cranial nerves grossly intact. Moves all 4 extremities w/o difficulty. Affect pleasant.   ASSESSMENT & PLAN: 1. Chronic Systolic HF - due to ICM --Echo 3/18 EF 35-40%. RV ok  No PAH IVC normal --NYHA II - Volume status stable.  -- paracentesis 01/04/17 with 3.4L -- Prealbumin is 6 and albumin 1.9 -- He is unable to take Kcl - Volume status stable despite only taking 60mg  torsemide in the am, he has missed his night doses of torsemide 40mg  recently.  -- Continue spiro to 50 bid.  - BMET today.  2. Cirrhosis -- Clearly end-stage. Very poor synthetic function -- [facility]s [facility] GI at [facility].  3. CAD S/P CABG x3 2016 - No ischemia.  - Continue  aspirin and statin  4. Stage IV Sacral Pressure Ulcer -- Followed by [facility] for wound care. VAC in place. Currently not healing 5. Severe Malnutrition - has tried to increase his protein.  - Still struggles with this.  6. CKD Stage III - BMET today.   7. End of life issues 8. Deconditioning  - family has asked for [facility] to see for PT, will order.   BMET today, will place order for PT, follow up in 2 months.     Little Ishikawa, NP  2:49 PM  Patient seen and examined with Suzzette Righter, NP. We  discussed all aspects of the encounter. I agree with the assessment and plan as stated above.   Overall volume status is improved. Continue current dose of diuretics. Check labs today. Can refer for repeat paracentesis as needed.   Arvilla Meres, MD  11:21 PM

## 2017-01-20 NOTE — Telephone Encounter (Signed)
Called patients daughter Hilbert Corrigan, she had the lab results for patients TSH and Free t4.  Labs drawn on 01/16/17-  TSH- 31.998 Free t4- .98  Patient is currently taking 100 mcg dose.  Daughter wanted MD to be notified if any changes needed to be made.   MD aware,.

## 2017-01-20 NOTE — Telephone Encounter (Signed)
[name] [name] ask you to give her a call, she has more information about patient blood work.pb

## 2017-01-20 NOTE — Patient Instructions (Signed)
Make sure to take your afternoon dose of Torsemide, ok to take it around 4 or 5 pm, can hold if your blood pressure is less than 90  Labs today  You have been referred to Physical Therapy with [facility]  Your physician recommends that you schedule a follow-up appointment in: 2 months

## 2017-01-20 NOTE — Telephone Encounter (Signed)
Yes, that is fine. 

## 2017-01-20 NOTE — Telephone Encounter (Signed)
This increased the dose to 125 g daily of levothyroxine and have him back for labs in 2 months. Can you please order a TSH and free T4 for then if he is not scheduled for another appointment with me around that time?

## 2017-01-20 NOTE — Telephone Encounter (Signed)
Called Lawson Fiscal back regarding lab results.  Patient went and got his TSH, and free t4 done and she wanted you to be aware of the results.  Labs on 01/16/17  TSH- 31.98 Free t4- .98  Patient is currently taking 100 mcg dose still. Daughter wanted to make sure no changes needed to be made. Thank you!

## 2017-01-27 ENCOUNTER — Other Ambulatory Visit (HOSPITAL_COMMUNITY): Payer: Self-pay | Admitting: *Deleted

## 2017-01-27 ENCOUNTER — Other Ambulatory Visit (HOSPITAL_COMMUNITY): Payer: Self-pay | Admitting: Internal Medicine

## 2017-01-27 DIAGNOSIS — R188 Other ascites: Secondary | ICD-10-CM

## 2017-01-28 ENCOUNTER — Ambulatory Visit (HOSPITAL_COMMUNITY): Payer: Medicare Other

## 2017-01-28 ENCOUNTER — Ambulatory Visit (HOSPITAL_COMMUNITY)
Admission: RE | Admit: 2017-01-28 | Discharge: 2017-01-28 | Disposition: A | Discharge status: Home / Self Care | Payer: Medicare Other | Source: Ambulatory Visit | Attending: Internal Medicine | Admitting: Internal Medicine

## 2017-01-28 DIAGNOSIS — R188 Other ascites: Secondary | ICD-10-CM | POA: Insufficient documentation

## 2017-01-28 DIAGNOSIS — K7460 Unspecified cirrhosis of liver: Secondary | ICD-10-CM | POA: Diagnosis present

## 2017-01-28 NOTE — Sedation Documentation (Signed)
Patient denies pain and is resting comfortably.  

## 2017-01-28 NOTE — Sedation Documentation (Signed)
Vital signs stable. 

## 2017-01-28 NOTE — Procedures (Signed)
PreOperative Dx: Cirrhosis, ascites Postoperative Dx: Cirrhosis, ascites Procedure:   US guided paracentesis Radiologist:  Marelyn Rouser Anesthesia:  10 ml of1% lidocaine Specimen:  3.2 L of yellow ascitic fluid EBL:   < 1 ml Complications: None  

## 2017-01-31 ENCOUNTER — Telehealth: Payer: Self-pay | Admitting: Cardiology

## 2017-01-31 NOTE — Telephone Encounter (Signed)
Pt's family Lawson Radar called, since paracentesis her dad has been off torsemide and spironolactone for a week.  We discussed and he will resume torsemide 60 in AM and spironolactone 50 mg daily.  I am not sure he will take that much.  He does not wish to take any diuretic.  She will fill pill box for now with above dose and they know to call if wt increases.

## 2017-02-02 ENCOUNTER — Telehealth (HOSPITAL_COMMUNITY): Payer: Self-pay | Admitting: *Deleted

## 2017-02-02 NOTE — Telephone Encounter (Signed)
Delice Lesch with [facility] called to verify that patient is needing Home PT.  Verbal orders given.

## 2017-02-03 ENCOUNTER — Ambulatory Visit (HOSPITAL_COMMUNITY): Payer: Medicare Other

## 2017-02-15 ENCOUNTER — Ambulatory Visit (INDEPENDENT_AMBULATORY_CARE_PROVIDER_SITE_OTHER): Payer: Medicare Other | Admitting: Internal Medicine

## 2017-02-15 ENCOUNTER — Encounter: Payer: Self-pay | Admitting: Internal Medicine

## 2017-02-15 VITALS — BP 118/72 | HR 70 | Wt 139.0 lb

## 2017-02-15 DIAGNOSIS — E1165 Type 2 diabetes mellitus with hyperglycemia: Secondary | ICD-10-CM | POA: Diagnosis not present

## 2017-02-15 DIAGNOSIS — E039 Hypothyroidism, unspecified: Secondary | ICD-10-CM | POA: Diagnosis not present

## 2017-02-15 DIAGNOSIS — E1159 Type 2 diabetes mellitus with other circulatory complications: Secondary | ICD-10-CM | POA: Diagnosis not present

## 2017-02-15 LAB — TSH: TSH: 11.06 u[IU]/mL — ABNORMAL HIGH (ref 0.35–4.50)

## 2017-02-15 LAB — T4, FREE: Free T4: 1.25 ng/dL (ref 0.60–1.60)

## 2017-02-15 LAB — POCT GLYCOSYLATED HEMOGLOBIN (HGB A1C): Hemoglobin A1C: 6.6

## 2017-02-15 MED ORDER — GLUCOSE BLOOD VI STRP: ORAL_STRIP | 3 refills | Status: AC

## 2017-02-15 MED ORDER — INSULIN NPH (HUMAN) (ISOPHANE) 100 UNIT/ML ~~LOC~~ SUSP: SUBCUTANEOUS | 11 refills | Status: AC

## 2017-02-15 NOTE — Patient Instructions (Addendum)
Try to switch from Ensure to Glucerna.  Try diet cranberry juice.  Please continue: - 6 units of NPH in am - 4-6 units of NPH at night  Please continue Levothyroxine 125 mcg daily.  Take the thyroid hormone every day, with water, at least 30 minutes before breakfast, separated by at least 4 hours from: - acid reflux medications - calcium - iron - multivitamins  Please stop at the lab.  Please return in 3 months with your sugar log.

## 2017-02-15 NOTE — Progress Notes (Signed)
Patient ID: [name] [name], male   DOB: [birth year], 76 y.o.   MRN: [id number]  HPI: [name] [name] is a 76 y.o.-year-old male, initially referred by his gastroenterologist, Dr Carlean Purl, returning for follow-up for DM2, dx in ~2006, insulin-dependent, uncontrolled, with complications (CAD, s/p AMI, s/p CABG; CKD). Last visit 3.5 mo ago.  Reviewed hx: Patient was admitted in 01/2015 with acute biliary pancreatitis secondary to gallstones and he had a very complicated hospitalization. He had an MI and had to have 3 vessel CABG during that hospitalization. He also developed esophagitis with GI bleeding. He is now slowly improving and continues to see Dr. Carlean Purl for his pancreatitis. On the latest CT scan, there was a peripancreatic fluid collection, ? Necrosis. He has diarrhea >> started Creon >> a little better.   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  He had cholecystectomy in 07/2015 and had 3 ERCPs and had the IVC filter extracted.  Last hemoglobin A1c was: Lab Results  Component Value Date   HGBA1C 6.9 11/02/2016   HGBA1C 6.8 (H) 08/06/2016   HGBA1C 5.9 04/06/2016   Pt was on a regimen of: - Metformin 500 mg 2x a day, with meals (losing weight on a higher dose) - Lantus 8 >> 10 units in am - Humalog SSI units 3x a day, before meals: 201-250: + 2 units 251-300: + 3 units > 300: + 4 units He was on Glipizide before the pancreatitis. He was on Januvia.  While on tube feeds, we changed to NPH insulin: - NPH 6 units in am and 6 >> 4 units at bedtime (decreased the dose 07/2016)  Now on: - NPH  6 units in am and 6 units at bedtime.  Pt checks his sugars 2x a day: - 6 am: 154-266 >> 82-175 (most 120s-150s) >> 93-147, 155, 175 >> n/c >> 68,  101-146 - after OJ: 109, 119-178 >> 117-201 (OJ) - after b'fast: 101-196 - before lunch: 94, 105 >> n/c >> 205 (after snack) >> n/c - after lunch:  111-167 >> n/c - 4 pm (before feeds): 79-177 >> before dinner: 85-172, 190 >> n/c  - 9 pm (bedtime): 94, 192- >> 74-194 >> 111, 120-187, 195 >> 161-200 >> 170-202 - nighttime: 111-195 No lows. Lowest sugar was 109; he has hypoglycemia awareness at 70.  Highest sugar was 200.  Before tube feeds, Pt's meals were: - Breakfast: cereal + strawberries (Special K) + oatmeal; toast + eggs; scrambled eggs; cheese toast + OJ - Lunch: potatoes, casseroles, chicken - Dinner: sandwich or soup, used to eat out + cranberry juice - regular - Snacks: yoghurt, cheese; sips Ensure throughout the day  - + CKD, last BUN/creatinine:  Lab Results  Component Value Date   BUN 33 (H) 01/20/2017   CREATININE 1.76 (H) 01/20/2017  10/12/2016: 29/1.88, GFR 34 09/24/2016: 37/1.94, GFR 34, albumin 2.5, AST/ALT 161/146 07/07/2016: 32/1.53, GFR 45, albumin 2.5, AST/ALT 173/150On Lisinopril. - last set of lipids: Lab Results  Component Value Date   CHOL 104 01/15/2015   HDL 26 (L) 01/15/2015   LDLCALC 64 01/15/2015   TRIG 195 (H) 01/19/2015   CHOLHDL 4.0 01/15/2015  On Lipitor. - last eye exam was in 04/2015 >> No DR  - denies numbness and tingling in his  feet.  Since last visit: Received labs from PCP, drawn on 10/29/2016: - Glucose 123, BUN/creatinine 23/1.76, eGFR 37. Potassium was low at 3.4, calcium was low at 7.4, albumin was low at 2.5, AST and ALT were high at 87/63. Alkaline phosphatase was normal at 67. - However, patient was found to be profoundly hypothyroid, with a TSH of 105.5  I advised the patient to start levothyroxine 50 g daily and increase to 75 g in 2 weeks. TSH improved but was still high >> we subsequently increased the LT4 dose to 100 in 11/2016, then 125 mcg daily in 01/2017.  Pt takes LT4 75 mcg: - in am - fasting - with water -  at least 20-30 min from b'fast - no Ca, Fe - no PPIs - + MVI at lunch - not on Biotin  Reviewed TFTs: 01/16/2017: TSH 31.998, fT4 0.98 Lab Results  Component Value Date   TSH 64.398 (H) 11/29/2016  10/29/2016: TSH 105.5  ROS: Constitutional: + weight loss (also related to paracentesis), no fatigue, no subjective hyperthermia, no subjective hypothermia Eyes: no blurry vision, no xerophthalmia ENT: no sore throat, no nodules palpated in throat, no dysphagia, no odynophagia, no hoarseness Cardiovascular: no CP/no SOB/no palpitations/no leg swelling Respiratory: no cough/no SOB/no wheezing Gastrointestinal: no N/no V/no D/no C/no acid reflux Musculoskeletal: no muscle aches/no joint aches Skin: no rashes, no hair loss Neurological: no tremors/no numbness/no tingling/no dizziness  I reviewed pt's medications, allergies, PMH, social hx, family hx, and changes were documented in the history of present illness. Otherwise, unchanged from my initial visit note.  Past Medical History:  Diagnosis Date  . Cataract   . Cholelithiasis   . Diabetes mellitus type 2 in obese ([place])   . DVT (deep venous thrombosis) ([place])   . GERD (gastroesophageal reflux disease)   . Hypertension   . MI (myocardial infarction) ([place]) 01/2015  . Pancreatitis 01/2015  . Pressure ulcer    Past Surgical History:  Procedure Laterality Date  . CARDIAC CATHETERIZATION N/A 01/17/2015   Procedure: Left Heart Cath and Coronary Angiography;  Surgeon: Belva Crome, MD;  Location: [place] CV LAB;  Service: Cardiovascular;  Laterality: N/A;  . CORONARY ARTERY BYPASS GRAFT N/A 02/14/2015   Procedure: CORONARY ARTERY BYPASS GRAFTING (CABG) x three, using left internal mammary artery and left leg greater saphenous vein harvested endoscopically;  Surgeon: Ivin Poot, MD;  Location: [place];  Service: Open Heart Surgery;  Laterality: N/A;  . ESOPHAGOGASTRODUODENOSCOPY N/A 01/21/2015   Procedure: ESOPHAGOGASTRODUODENOSCOPY  (EGD);  Surgeon: Irene Shipper, MD;  Location[facility] ENDOSCOPY;  Service: Endoscopy;  Laterality: N/A;  to be done at [place]  . ESOPHAGOGASTRODUODENOSCOPY N/A 02/05/2015   Procedure: ESOPHAGOGASTRODUODENOSCOPY (EGD);  Surgeon: Lafayette Dragon, MD;  Location: [facility] ENDOSCOPY;  Service: Endoscopy;  Laterality: N/A;  . TEE WITHOUT CARDIOVERSION N/A 02/14/2015   Procedure: TRANSESOPHAGEAL ECHOCARDIOGRAM (TEE);  Surgeon: Ivin Poot, MD;  Location: [place];  Service: Open Heart Surgery;  Laterality: N/A;   Social History   Social History  . Marital Status: Married    Spouse Name: N/A  . Number of Children: N/A   Social History Main Topics  . Smoking status: Never Smoker   . Smokeless tobacco: Never Used  . Alcohol Use: No  . Drug Use: No   Current Outpatient Prescriptions on File Prior to Visit  Medication Sig Dispense Refill  . aspirin EC 81 MG EC tablet Take 1 tablet (81 mg total) by mouth daily.    Marland Kitchen  Cholecalciferol (VITAMIN D-3) 1000 units CAPS Take 3,000 Units by mouth daily.     . Ferrous Gluconate (IRON 27 PO) Take 27 mg by mouth daily.    . Folic Acid-Vit W4-XLK G40 (FOLBEE) 2.5-25-1 MG TABS tablet Take 1 tablet by mouth daily.    . furosemide (LASIX) 10 MG/ML solution Take 8 mLs (80 mg total) by mouth daily. 24 mL 0  . glucose blood (ONETOUCH VERIO) test strip Use to test blood sugar 2 times daily as instructed. Dx: E11.59 200 each 3  . insulin NPH Human (HUMULIN N) 100 UNIT/ML injection Inject 6 units before b'fast and another 4 units at bedtime. PENS, PLEASE!! 15 mL 2  . Insulin Pen Needle (CAREFINE PEN NEEDLES) 32G X 4 MM MISC Use 2x a day 100 each 11  . latanoprost (XALATAN) 0.005 % ophthalmic solution Place 1 drop into both eyes at bedtime.    Marland Kitchen levothyroxine (SYNTHROID, LEVOTHROID) 125 MCG tablet Take one tablet by mouth daily 90 tablet 3  . lipase/protease/amylase (CREON) 36000 UNITS CPEP capsule Take 24,000 Units by mouth 3 (three) times daily before meals.    . Loperamide HCl  (IMODIUM PO) Take by mouth at bedtime. 1-2 Tablets @@ bed time    . metolazone (ZAROXOLYN) 2.5 MG tablet Take 1 tablet (2.5 mg total) by mouth daily as needed (For weight gain 3 lbs or more overnight/5 lbs or more in 1 week). 30 tablet 3  . Multiple Vitamins-Minerals (CENTRUM SILVER PO) Take 2 tablets by mouth daily.     . ondansetron (ZOFRAN) 4 MG tablet Take 4 mg by mouth every 8 (eight) hours as needed for nausea or vomiting.    Glory Rosebush DELICA LANCETS 10U MISC Use to test blood sugar 2 times daily. Dx: E11.59 200 each 3  . oxandrolone (OXANDRIN) 2.5 MG tablet Take 2.5 mg by mouth 2 (two) times daily.     . pantoprazole (PROTONIX) 40 MG tablet Take 40 mg by mouth 2 (two) times daily.    . potassium chloride 20 MEQ/15ML (10%) SOLN Take 30 mLs (40 mEq total) by mouth daily. 450 mL 0  . spironolactone (ALDACTONE) 25 MG tablet Take 50 mg by mouth daily.    Marland Kitchen thiamine (VITAMIN B-1) 50 MG tablet Take 50 mg by mouth daily.     Marland Kitchen torsemide (DEMADEX) 20 MG tablet Take 3 tabs in AM and 2 tabs in PM 150 tablet 6  . vitamin A 10000 UNIT capsule Take 10,000 Units by mouth daily.    . vitamin C (ASCORBIC ACID) 500 MG tablet Take 500 mg by mouth daily.     . Zinc 50 MG CAPS Take 45 mg by mouth as directed.      No current facility-administered medications on file prior to visit.    No Known Allergies Family History  Problem Relation Age of Onset  . Heart attack Brother 10  . Pancreatic cancer Sister 53       died in her 52s.    PE: BP 118/72 (BP Location: Left Arm, Patient Position: Sitting)   Pulse 70   Wt 139 lb (63 kg)   SpO2 98%   BMI 19.39 kg/m  Body mass index is 19.39 kg/m.  Wt Readings from Last 3 Encounters:  02/15/17 139 lb (63 kg)  01/20/17 138 lb 8 oz (62.8 kg)  01/05/17 151 lb 3.2 oz (68.6 kg)   Constitutional: thin, protuberant abd, in NAD Eyes: PERRLA, EOMI, no exophthalmos ENT: moist mucous membranes, no thyromegaly,  no cervical lymphadenopathy Cardiovascular: RRR, No  MRG Respiratory: CTA B Gastrointestinal: abdomen soft, NT, ND, BS+ Musculoskeletal: no deformities, strength intact in all 4 Skin: moist, warm, no rashes Neurological: no tremor with outstretched hands, DTR normal in all 4   ASSESSMENT: 1. DM2, insulin-dependent, uncontrolled, with complications - CAD, s/p AMI, s/p 3v CABG - CKD  2. Hypothyroidism  PLAN:  1. Patient with insulin-dependent diabetes, likely due to pancreatitis. He was prev. On TF's >> now off. He drinks juice  >> cranberry juice at night >> advised to try the diet one or Almond milk. Also, he drinks OJ in am >> try to reduce this. He is on Ensure >> suggested to switch to Glucerna. Sugars after meals are higher, but not enough to change regimen for now, esp. If he starts working on the above diet changes. - I suggested to:  Patient Instructions  Try to switch from Ensure to Glucerna.  Try diet cranberry juice.  Please continue: - 6 units of NPH in am - 4-6 units of NPH at night  Please continue Levothyroxine 125 mcg daily.  Take the thyroid hormone every day, with water, at least 30 minutes before breakfast, separated by at least 4 hours from: - acid reflux medications - calcium - iron - multivitamins  Please stop at the lab.  Please return in 3 months with your sugar log.   - today, HbA1c is 6.6% (great) - continue checking sugars at different times of the day - check 2-3x a day, rotating checks - advised for yearly eye exams >> he is due - Return to clinic in 3 mo with sugar log   2. Hypothyroidism - recently found to have a very high TSH >> we started LT4 and then increased dose  - he continues on LT4 125 mcg daily - he mentions he does not feel much different after he started this - we discussed about taking the thyroid hormone every day, with water, >30 minutes before breakfast, separated by >4 hours from acid reflux medications, calcium, iron, multivitamins. Pt. is taking it correctly, but advised  to wait at least 30 min until  He eats (not always this much) - will check thyroid tests today: TSH and fT4 - If labs are abnormal, he will need to return for repeat TFTs in 1.5 months - OTW, RTC in 3 mo  Office Visit on 02/15/2017  Component Date Value Ref Range Status  . TSH 02/15/2017 11.06* 0.35 - 4.50 uIU/mL Final  . Free T4 02/15/2017 1.25  0.60 - 1.60 ng/dL Final   Comment: Specimens from patients who are undergoing biotin therapy and /or ingesting biotin supplements may contain high levels of biotin.  The higher biotin concentration in these specimens interferes with this Free T4 assay.  Specimens that contain high levels  of biotin may cause false high results for this Free T4 assay.  Please interpret results in light of the total clinical presentation of the patient.    . Hemoglobin A1C 02/15/2017 6.6   Final   TFTs much improved in only 1 mo. I would like to repeat his TFTs in 3 mo, but will not change his LT4 dose for now.   Philemon Kingdom, MD PhD [facility] Endocrinology

## 2017-03-02 ENCOUNTER — Ambulatory Visit (INDEPENDENT_AMBULATORY_CARE_PROVIDER_SITE_OTHER): Payer: Medicare Other | Admitting: Cardiovascular Disease

## 2017-03-02 ENCOUNTER — Encounter: Payer: Self-pay | Admitting: Cardiovascular Disease

## 2017-03-02 ENCOUNTER — Telehealth: Payer: Self-pay | Admitting: Cardiovascular Disease

## 2017-03-02 VITALS — BP 125/75 | HR 68 | Ht 71.0 in | Wt 145.6 lb

## 2017-03-02 DIAGNOSIS — R600 Localized edema: Secondary | ICD-10-CM

## 2017-03-02 DIAGNOSIS — E43 Unspecified severe protein-calorie malnutrition: Secondary | ICD-10-CM | POA: Diagnosis not present

## 2017-03-02 DIAGNOSIS — I255 Ischemic cardiomyopathy: Secondary | ICD-10-CM

## 2017-03-02 DIAGNOSIS — R188 Other ascites: Secondary | ICD-10-CM

## 2017-03-02 DIAGNOSIS — I10 Essential (primary) hypertension: Secondary | ICD-10-CM

## 2017-03-02 DIAGNOSIS — I5022 Chronic systolic (congestive) heart failure: Secondary | ICD-10-CM | POA: Diagnosis not present

## 2017-03-02 DIAGNOSIS — K7460 Unspecified cirrhosis of liver: Secondary | ICD-10-CM

## 2017-03-02 DIAGNOSIS — I6523 Occlusion and stenosis of bilateral carotid arteries: Secondary | ICD-10-CM

## 2017-03-02 DIAGNOSIS — I2510 Atherosclerotic heart disease of native coronary artery without angina pectoris: Secondary | ICD-10-CM

## 2017-03-02 MED ORDER — TORSEMIDE 20 MG PO TABS: 20.0000 mg | ORAL_TABLET | Freq: Two times a day (BID) | ORAL | Status: DC

## 2017-03-02 NOTE — Patient Instructions (Addendum)
Medication Instructions:   You may continue the Torsemide at 20mg  twice a day.  Continue all other medications.    Labwork: none  Testing/Procedures:  Your physician has requested that you have a carotid duplex. This test is an ultrasound of the carotid arteries in your neck. It looks at blood flow through these arteries that supply the brain with blood. Allow one hour for this exam. There are no restrictions or special instructions.  Office will contact with results via phone or letter.    Follow-Up: Your physician wants you to follow up in: 6 months.  You will receive a reminder letter in the mail one-two months in advance.  If you don't receive a letter, please call our office to schedule the follow up appointment   Any Other Special Instructions Will Be Listed Below (If Applicable).  If you need a refill on your cardiac medications before your next appointment, please call your pharmacy.

## 2017-03-02 NOTE — Progress Notes (Signed)
SUBJECTIVE: The patient presents for follow-up of chronic systolic heart failure. He has a history of CABG, non-STEMI, and DVT. He also has chronic pancreatitis, cirrhosis, and hypothyroidism and is followed at [facility] for the former.  He has been evaluated at the advanced heart failure clinic.  Weight is up to 145 pounds from 139 pounds on 02/15/17.  He is here with his daughter, Dorene Grebe.  He underwent paracentesis on 01/28/17.  He denies chest pain and shortness of breath. He takes torsemide 20 mg twice daily and spironolactone 50 mg.  He has abdominal distention and plans to make another appointment to see GI at [facility].     Social history:His daughter, Harvest Forest, is a retired PA who worked for the [employer] for 20 years.   Review of Systems: As per "subjective", otherwise negative.  No Known Allergies  Current Outpatient Prescriptions  Medication Sig Dispense Refill  . aspirin EC 81 MG EC tablet Take 1 tablet (81 mg total) by mouth daily.    . Cholecalciferol (VITAMIN D-3) 1000 units CAPS Take 3,000 Units by mouth daily.     . Ferrous Gluconate (IRON 27 PO) Take 27 mg by mouth daily.    . Folic Acid-Vit B6-Vit B12 (FOLBEE) 2.5-25-1 MG TABS tablet Take 1 tablet by mouth daily.    Marland Kitchen glucose blood (ONETOUCH VERIO) test strip Use to test blood sugar 2 times daily as instructed. Dx: E11.59 200 each 3  . insulin NPH Human (HUMULIN N) 100 UNIT/ML injection Inject 6 units before b'fast and another 4-6 units at bedtime. PENS, PLEASE!! 15 mL 11  . Insulin Pen Needle (CAREFINE PEN NEEDLES) 32G X 4 MM MISC Use 2x a day 100 each 11  . latanoprost (XALATAN) 0.005 % ophthalmic solution Place 1 drop into both eyes at bedtime.    Marland Kitchen levothyroxine (SYNTHROID, LEVOTHROID) 125 MCG tablet Take one tablet by mouth daily 90 tablet 3  . lipase/protease/amylase (CREON) 36000 UNITS CPEP capsule Take 24,000 Units by mouth 3 (three) times daily before  meals.    . Loperamide HCl (IMODIUM PO) Take by mouth at bedtime. 1-2 Tablets @@ bed time    . Multiple Vitamins-Minerals (CENTRUM SILVER PO) Take 2 tablets by mouth daily.     . ondansetron (ZOFRAN) 4 MG tablet Take 4 mg by mouth every 8 (eight) hours as needed for nausea or vomiting.    Letta Pate DELICA LANCETS 33G MISC Use to test blood sugar 2 times daily. Dx: E11.59 200 each 3  . oxandrolone (OXANDRIN) 2.5 MG tablet Take 2.5 mg by mouth 2 (two) times daily.     . pantoprazole (PROTONIX) 40 MG tablet Take 40 mg by mouth 2 (two) times daily.    Marland Kitchen spironolactone (ALDACTONE) 25 MG tablet Take 50 mg by mouth daily.    Marland Kitchen thiamine (VITAMIN B-1) 50 MG tablet Take 50 mg by mouth daily.     Marland Kitchen torsemide (DEMADEX) 20 MG tablet Take 3 tabs in AM and 2 tabs in PM 150 tablet 6  . vitamin A 40981 UNIT capsule Take 10,000 Units by mouth daily.    . vitamin C (ASCORBIC ACID) 500 MG tablet Take 500 mg by mouth daily.     . Zinc 50 MG CAPS Take 45 mg by mouth as directed.      No current facility-administered medications for this visit.     Past Medical History:  Diagnosis Date  . Cataract   . Cholelithiasis   .  Diabetes mellitus type 2 in obese ([facility])   . DVT (deep venous thrombosis) ([facility])   . GERD (gastroesophageal reflux disease)   . Hypertension   . MI (myocardial infarction) ([facility]) 01/2015  . Pancreatitis 01/2015  . Pressure ulcer     Past Surgical History:  Procedure Laterality Date  . CARDIAC CATHETERIZATION N/A 01/17/2015   Procedure: Left Heart Cath and Coronary Angiography;  Surgeon: Lyn Records, MD;  [facility] INVASIVE CV LAB;  Service: Cardiovascular;  Laterality: N/A;  . CORONARY ARTERY BYPASS GRAFT N/A 02/14/2015   Procedure: CORONARY ARTERY BYPASS GRAFTING (CABG) x three, using left internal mammary artery and left leg greater saphenous vein harvested endoscopically;  Surgeon: Kerin Perna, MD;  Location: [facility] OR;  Service: Open Heart Surgery;  Laterality: N/A;  .  ESOPHAGOGASTRODUODENOSCOPY N/A 01/21/2015   Procedure: ESOPHAGOGASTRODUODENOSCOPY (EGD);  Surgeon: Hilarie Fredrickson, MD;  Location[facility] ENDOSCOPY;  Service: Endoscopy;  Laterality: N/A;  to be done at [facility]  . ESOPHAGOGASTRODUODENOSCOPY N/A 02/05/2015   Procedure: ESOPHAGOGASTRODUODENOSCOPY (EGD);  Surgeon: Hart Carwin, MD;  Location: [facility] ENDOSCOPY;  Service: Endoscopy;  Laterality: N/A;  . TEE WITHOUT CARDIOVERSION N/A 02/14/2015   Procedure: TRANSESOPHAGEAL ECHOCARDIOGRAM (TEE);  Surgeon: Kerin Perna, MD;  Location: [facility] OR;  Service: Open Heart Surgery;  Laterality: N/A;    Social History   Social History  . Marital status: Married    Spouse name: N/A  . Number of children: N/A  . Years of education: N/A   Occupational History  . Not on file.   Social History Main Topics  . Smoking status: Never Smoker  . Smokeless tobacco: Never Used  . Alcohol use No  . Drug use: No  . Sexual activity: Yes   Other Topics Concern  . Not on file   Social History Narrative  . No narrative on file     Vitals:   03/02/17 1255  BP: 125/75  Pulse: 68  SpO2: 100%  Weight: 145 lb 9.6 oz (66 kg)  Height: 5\' 11"  (1.803 m)    Wt Readings from Last 3 Encounters:  03/02/17 145 lb 9.6 oz (66 kg)  02/15/17 139 lb (63 kg)  01/20/17 138 lb 8 oz (62.8 kg)     PHYSICAL EXAM General: Cachetic, elderly male. NAD. HEENT: Temporal wasting. Neck: No JVD, no thyromegaly. Lungs: Clear to auscultation bilaterally with normal respiratory effort. CV: Nondisplaced PMI.  Regular rate and rhythm, normal S1/S2, no S3/S4, no murmur. Trace b/l pretibial edema.  Left carotid bruit.   Abdomen: Mild distention.  Neurologic: Alert and oriented.  Psych: Normal affect. Skin: Normal. Musculoskeletal: No gross deformities.    ECG: Most recent ECG reviewed.   Labs: Lab Results  Component Value Date/Time   K 4.7 01/20/2017 03:28 PM   BUN 33 (H) 01/20/2017 03:28 PM   CREATININE 1.76 (H) 01/20/2017 03:28 PM     ALT 47 12/10/2016 12:27 PM   TSH 11.06 (H) 02/15/2017 11:10 AM   HGB 9.4 (L) 11/29/2016 12:14 PM     Lipids: Lab Results  Component Value Date/Time   LDLCALC 64 01/15/2015 01:59 AM   CHOL 104 01/15/2015 01:59 AM   TRIG 195 (H) 01/19/2015 04:59 AM   HDL 26 (L) 01/15/2015 01:59 AM       ASSESSMENT AND PLAN: 1. CAD with 3-vessel CABG and non-STEMI: Symptomatically stable. Continue aspirin. No longer on statin. I previously stopped beta blocker when he was initially diagnosed with severe hypothyroidism. Heart rate in 60  beat per minute range at present. Hypothyroidism under much better control now. I would consider restarting beta blocker at a future date.  2. Chronic systolic heart failure/ischemic cardiomyopathy, EF 45%/bilateral leg edema: Stable on torsemide 20 mg bid. Also on spironolactone 50 mg daily. Plans to undergo repeat paracentesis.  3. DVT: IVC filter removed in 08/2015. No longer on anticoagulation.  4. Hyperlipidemia: No longer on statin.  5. Essential HTN: Normal. No changes.  6. Bilateral carotid artery stenosis: I will repeat Dopplers. Continue ASA.  7. Severe hypothyroidism: Under much better control on levothyroxin 125 g daily. TSH down to 11.    Disposition: Follow up 6 months with me. FU with Dr. Gala Romney as scheduled.  Prentice Docker, M.D., F.A.C.C.

## 2017-03-02 NOTE — Telephone Encounter (Signed)
July 12  Carotid scheduled in [facility]

## 2017-03-04 ENCOUNTER — Ambulatory Visit (HOSPITAL_COMMUNITY)
Admission: RE | Admit: 2017-03-04 | Discharge: 2017-03-04 | Disposition: A | Discharge status: Home / Self Care | Payer: Medicare Other | Source: Ambulatory Visit | Attending: Internal Medicine | Admitting: Internal Medicine

## 2017-03-04 DIAGNOSIS — R188 Other ascites: Secondary | ICD-10-CM | POA: Diagnosis not present

## 2017-03-04 NOTE — Sedation Documentation (Signed)
1.2 liters of peritoneal fluid drawn. Pressure and Dermabond appliled to puncture site rlq

## 2017-03-04 NOTE — Sedation Documentation (Signed)
Patient tolerated procedure well and was discharged in stable condition.

## 2017-03-04 NOTE — Sedation Documentation (Signed)
Site leaking slightly Dressing applied.

## 2017-03-04 NOTE — Sedation Documentation (Signed)
Dr. Boles at bedside. 

## 2017-03-04 NOTE — Procedures (Signed)
PreOperative Dx: Ascites Postoperative Dx: Ascites Procedure:   US guided paracentesis Radiologist:  Tyron Russell Anesthesia:  10 ml of1% lidocaine Specimen:  1.2 L of yellow ascitic fluid EBL:   < 1 ml Complications: None; unable to drain more fluid since fluid has become increasingly multiloculated

## 2017-03-09 ENCOUNTER — Telehealth: Payer: Self-pay

## 2017-03-09 ENCOUNTER — Telehealth: Payer: Self-pay | Admitting: Internal Medicine

## 2017-03-09 NOTE — Telephone Encounter (Signed)
Can you please advise on this while Dr.Gherghe is out. Thank you!

## 2017-03-09 NOTE — Telephone Encounter (Signed)
Called and notified the daughter. She understood and had no questions at this time. 

## 2017-03-09 NOTE — Telephone Encounter (Signed)
He will skip the morning dose on Thursday.  If he is doing a liquid diet tomorrow he can take half the morning dose tomorrow, continue nighttime doses unchanged

## 2017-03-09 NOTE — Telephone Encounter (Signed)
Called and notified the daughter. She understood and had no questions at this time.

## 2017-03-09 NOTE — Telephone Encounter (Signed)
He is scheduled for an endoscopy on Thursday, what is he to do with his insulin dosing during the prep

## 2017-03-15 ENCOUNTER — Encounter (HOSPITAL_COMMUNITY): Payer: Self-pay | Admitting: Internal Medicine

## 2017-03-15 ENCOUNTER — Telehealth (HOSPITAL_COMMUNITY): Payer: Self-pay | Admitting: *Deleted

## 2017-03-15 ENCOUNTER — Ambulatory Visit (HOSPITAL_COMMUNITY)
Admission: RE | Admit: 2017-03-15 | Discharge: 2017-03-15 | Disposition: A | Discharge status: Home / Self Care | Payer: Medicare Other | Source: Ambulatory Visit | Attending: Internal Medicine | Admitting: Internal Medicine

## 2017-03-15 VITALS — BP 132/58 | HR 62 | Wt 144.5 lb

## 2017-03-15 DIAGNOSIS — I6523 Occlusion and stenosis of bilateral carotid arteries: Secondary | ICD-10-CM | POA: Insufficient documentation

## 2017-03-15 DIAGNOSIS — K8591 Acute pancreatitis with uninfected necrosis, unspecified: Secondary | ICD-10-CM | POA: Insufficient documentation

## 2017-03-15 DIAGNOSIS — N183 Chronic kidney disease, stage 3 unspecified: Secondary | ICD-10-CM

## 2017-03-15 DIAGNOSIS — I130 Hypertensive heart and chronic kidney disease with heart failure and stage 1 through stage 4 chronic kidney disease, or unspecified chronic kidney disease: Secondary | ICD-10-CM | POA: Insufficient documentation

## 2017-03-15 DIAGNOSIS — Z951 Presence of aortocoronary bypass graft: Secondary | ICD-10-CM | POA: Diagnosis not present

## 2017-03-15 DIAGNOSIS — K219 Gastro-esophageal reflux disease without esophagitis: Secondary | ICD-10-CM | POA: Insufficient documentation

## 2017-03-15 DIAGNOSIS — K7460 Unspecified cirrhosis of liver: Secondary | ICD-10-CM | POA: Insufficient documentation

## 2017-03-15 DIAGNOSIS — I2510 Atherosclerotic heart disease of native coronary artery without angina pectoris: Secondary | ICD-10-CM | POA: Diagnosis not present

## 2017-03-15 DIAGNOSIS — I10 Essential (primary) hypertension: Secondary | ICD-10-CM | POA: Diagnosis not present

## 2017-03-15 DIAGNOSIS — L89154 Pressure ulcer of sacral region, stage 4: Secondary | ICD-10-CM | POA: Diagnosis not present

## 2017-03-15 DIAGNOSIS — E1122 Type 2 diabetes mellitus with diabetic chronic kidney disease: Secondary | ICD-10-CM | POA: Diagnosis not present

## 2017-03-15 DIAGNOSIS — I5022 Chronic systolic (congestive) heart failure: Secondary | ICD-10-CM | POA: Diagnosis not present

## 2017-03-15 DIAGNOSIS — Z86718 Personal history of other venous thrombosis and embolism: Secondary | ICD-10-CM | POA: Diagnosis not present

## 2017-03-15 DIAGNOSIS — E785 Hyperlipidemia, unspecified: Secondary | ICD-10-CM | POA: Insufficient documentation

## 2017-03-15 DIAGNOSIS — E43 Unspecified severe protein-calorie malnutrition: Secondary | ICD-10-CM | POA: Diagnosis not present

## 2017-03-15 DIAGNOSIS — I252 Old myocardial infarction: Secondary | ICD-10-CM | POA: Diagnosis not present

## 2017-03-15 DIAGNOSIS — Z794 Long term (current) use of insulin: Secondary | ICD-10-CM | POA: Diagnosis not present

## 2017-03-15 DIAGNOSIS — Z7982 Long term (current) use of aspirin: Secondary | ICD-10-CM | POA: Diagnosis not present

## 2017-03-15 DIAGNOSIS — E039 Hypothyroidism, unspecified: Secondary | ICD-10-CM | POA: Insufficient documentation

## 2017-03-15 DIAGNOSIS — R188 Other ascites: Secondary | ICD-10-CM

## 2017-03-15 MED ORDER — SPIRONOLACTONE 25 MG PO TABS: ORAL_TABLET | ORAL | 3 refills | Status: AC

## 2017-03-15 MED ORDER — TORSEMIDE 20 MG PO TABS: ORAL_TABLET | ORAL | 3 refills | Status: AC

## 2017-03-15 NOTE — Patient Instructions (Signed)
Labs today (will call for abnormal results, otherwise no news is good news)  Take Spironolactone 25 mg (1 Tablet) On Monday, Wednesday, and Friday only.  Take Torsemide 60 mg (3 Tablets) On Monday, Wednesday, and Friday only.  Follow up in 3 Months, we will contact you to schedule appointment.

## 2017-03-15 NOTE — Telephone Encounter (Signed)
Lab called to let us know that their machine containing patient's BMET had broken and they are unable to run the results now.  I called patient and spoke with patient's daughter about taking him to a local [facility] and have BMET drawn again.  She gave me the fax number for a local [facility].    Prescription to draw BMET faxed to [phone], they will fax results to [place] this week.

## 2017-03-15 NOTE — Progress Notes (Signed)
PCP: Dr Sherryll Burger Primary Cardiologist: Dr Purvis Sheffield GI: [facility] Dr Alycia Rossetti   HPI: [name] [name] is a 76 year old with history CAD, CABG, DVT, ICM, bilateral carotid stenosis, htn, hyperlipidemia, necrotizing pancreatitis, and hypothyroidism.    In January 2017 through March 2017 he had a prolonged hospitalzation pancreatitis. Hospital course complicated by pressure ulcer, malnutrition, and ascites. Required 2 paracentesis and placement of J tube.   Due to ongoing fatigue he was evaluated by his PCP in February of the year. TSH was markedly elevated at 105 on 10/28/16. Started on levothyroxine.   Since the end of January he has had progressive weight gain. (147>163 pounds). Diuretics have been adjusted but he has had poor results. He was seen by Dr Purvis Sheffield on March 6th. BB was stopped at that time.   On March the 25th he was evaluated at [facility] for increased edema. He was given IV lasix and discharged home. Offered hospital admit however he declined.    Pt presents today for regular follow up. Weight up 6 lbs from last visit.  Pt currently on ABX for inadvertantly pulling out his PEG tube. He was evaluated in endoscopy. Seen by PCP 03/05/17. Had EGD 03/11/17 with clipping to close. Remains on ABX. Continues to have drainage. Breathing has been great overall. Not SOB with ADLs. Walks slowly and with a cane.  Continues to have swelling in legs. He states he stopped his torsemide because he didn't think it was helping.  Doesn't take his heart medications (torsemide, spiro, ASA, . Takes his GERD medicine, his ABX, and his thyroid medicine. Doesn't like how much torsemide gets him to use the bathroom.   Echo 3/18 EF ~40% RV ok  Recent labs 12/01/16  K 3.2 Cr stable 1.7 BNP 223  CT abdomen 11/04/2016  1. 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  2. 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   3. Slight interval  increase in the degree of abdominopelvic ascites.  4. There is diffuse small bowel thickening which may relate to hypoproteinemia. There is, however, more marked submucosal edema throughout the colon to the level of the sigmoid. While this could represent portal colopathy or manifestations of hypoproteinemia, it would be difficult to exclude infectious colitis. Ischemic colitis would seem less likely given the distribution.  5. Anasarca, likely related to hypoproteinemia.  6. Numerous additional findings as described above.    ECHO 2016 EF 45% Echo 12/02/16 EF ~40% RV ok .   Social history:His daughter, [name] [name], is a retired PA who worked for the [employer] for 20 years. Lives with his wife.   Review of systems complete and found to be negative unless listed in HPI.    SH:  Social History   Social History  . Marital status: Married    Spouse name: N/A  . Number of children: N/A  . Years of education: N/A   Occupational History  . Not on file.   Social History Main Topics  . Smoking status: Never Smoker  . Smokeless tobacco: Never Used  . Alcohol use No  . Drug use: No  . Sexual activity: Yes   Other Topics Concern  . Not on file   Social History Narrative  . No narrative on file    FH:  Family History  Problem Relation Age of Onset  . Heart attack Brother 55  . Pancreatic cancer Sister 54  died in her 59s.     Past Medical History:  Diagnosis Date  . Cataract   . Cholelithiasis   . Diabetes mellitus type 2 in obese (HCC)   . DVT (deep venous thrombosis) (HCC)   . GERD (gastroesophageal reflux disease)   . Hypertension   . MI (myocardial infarction) (HCC) 01/2015  . Pancreatitis 01/2015  . Pressure ulcer     Current Outpatient Prescriptions  Medication Sig Dispense Refill  . amoxicillin-clavulanate (AUGMENTIN) 875-125 MG tablet Take 1 tablet by mouth 2 (two) times daily. For 10 days    . glucose blood (ONETOUCH VERIO)  test strip Use to test blood sugar 2 times daily as instructed. Dx: E11.59 200 each 3  . insulin NPH Human (HUMULIN N) 100 UNIT/ML injection Inject 6 units before b'fast and another 4-6 units at bedtime. PENS, PLEASE!! 15 mL 11  . Insulin Pen Needle (CAREFINE PEN NEEDLES) 32G X 4 MM MISC Use 2x a day 100 each 11  . latanoprost (XALATAN) 0.005 % ophthalmic solution Place 1 drop into both eyes at bedtime.    Marland Kitchen levothyroxine (SYNTHROID, LEVOTHROID) 125 MCG tablet Take one tablet by mouth daily 90 tablet 3  . aspirin EC 81 MG EC tablet Take 1 tablet (81 mg total) by mouth daily. (Patient not taking: Reported on 03/15/2017)    . Cholecalciferol (VITAMIN D-3) 1000 units CAPS Take 3,000 Units by mouth daily.     . Ferrous Gluconate (IRON 27 PO) Take 27 mg by mouth daily.    . Folic Acid-Vit B6-Vit B12 (FOLBEE) 2.5-25-1 MG TABS tablet Take 1 tablet by mouth daily.    . lipase/protease/amylase (CREON) 36000 UNITS CPEP capsule Take 24,000 Units by mouth 3 (three) times daily before meals.    . Loperamide HCl (IMODIUM PO) Take by mouth at bedtime. 1-2 Tablets @@ bed time    . Multiple Vitamins-Minerals (CENTRUM SILVER PO) Take 2 tablets by mouth daily.     . ondansetron (ZOFRAN) 4 MG tablet Take 4 mg by mouth every 8 (eight) hours as needed for nausea or vomiting.    Letta Pate DELICA LANCETS 33G MISC Use to test blood sugar 2 times daily. Dx: E11.59 (Patient not taking: Reported on 03/15/2017) 200 each 3  . oxandrolone (OXANDRIN) 2.5 MG tablet Take 2.5 mg by mouth 2 (two) times daily.     . pantoprazole (PROTONIX) 40 MG tablet Take 40 mg by mouth 2 (two) times daily.    Marland Kitchen spironolactone (ALDACTONE) 25 MG tablet Take 50 mg by mouth daily.    Marland Kitchen thiamine (VITAMIN B-1) 50 MG tablet Take 50 mg by mouth daily.     Marland Kitchen torsemide (DEMADEX) 20 MG tablet Take 1 tablet (20 mg total) by mouth 2 (two) times daily. (Patient not taking: Reported on 03/15/2017)    . vitamin A 09233 UNIT capsule Take 10,000 Units by mouth daily.      . vitamin C (ASCORBIC ACID) 500 MG tablet Take 500 mg by mouth daily.     . Zinc 50 MG CAPS Take 45 mg by mouth as directed.      No current facility-administered medications for this encounter.     Vitals:   03/15/17 1255  BP: (!) 132/58  Pulse: 62  SpO2: 100%  Weight: 144 lb 8 oz (65.5 kg)   Wt Readings from Last 3 Encounters:  03/15/17 144 lb 8 oz (65.5 kg)  03/02/17 145 lb 9.6 oz (66 kg)  02/15/17 139 lb (63 kg)  PHYSICAL EXAM:  General: Cachectic appearing male. Elderly. Fatigued. NAD.  HEENT: Temporal wasting Neck: Supple. JVP 9-10 cm. Carotids 2+ bilat; no bruits. No thyromegaly or nodule noted. Cor: PMI nondisplaced. RRR, No M/G/R noted. No RV lift. Lungs: Clear bilaterally in upper lobes. Diminished basilar sounds.  Abdomen: Soft, non-tender, non-distended, no HSM. No bruits or masses. +BS. Extremities: No cyanosis, clubbing, or rash. 2+ pedal edema.  Neuro: Alert & orientedx3, cranial nerves grossly intact. moves all 4 extremities w/o difficulty. Affect pleasant   ASSESSMENT & PLAN: 1. Chronic Systolic HF - due to ICM --Echo 3/18 EF 35-40%. RV ok  No PAH IVC normal --NYHA II-III. Not very active - Volume status looks elevated on exam.  - He has not been taking any torsemide or spiro. Previously prescribed torsemide 60/40 and spiro 50 mg BID.  - Recommended he take torsemide 60 mg M/W/F. Can take extra as needed.  - Restart spiro at 25 mg M/W/F -- Paracentesis 01/28/17 with 3.2 L out, and 03/04/17 with 1.2 L out.  -- Prealbumin is 6 and albumin 1.9 12/10/16 -- He is unable to take Kcl -- BMET today.   2. Cirrhosis -- Clearly end-stage. Very poor synthetic function -- Follows with GI at [facility]. 3. CAD S/P CABG x3 2016 - No ischemia.  - Continue  aspirin and statin  4. Stage IV Sacral Pressure Ulcer -- Followed by [facility] for wound care. Somewhat improved.  5. Severe Malnutrition - Has struggled to increase his protein intake. - Now taking Prostat -  Encouraged Ensure intake. 6. CKD Stage III - BMET today.  7. End of life issues 8. Deconditioning  - Have ordered HHPT.  9. PEG tube displacement - Stable. Recently clipped in Endoscopy.  - Completing ABX this week.   Meds as above. BMET today. RTC 3 months.   Graciella Freer, PA-C  12:58 PM   Patient seen and examined with the above-signed Advanced Practice Provider and/or Housestaff. I personally reviewed laboratory data, imaging studies and relevant notes. I independently examined the patient and formulated the important aspects of the plan. I have edited the note to reflect any of my changes or salient points. I have personally discussed the plan with the patient and/or family.  He continues to struggle with severe malnutrition syndrome and whole body edema. Has stopped taking diuretics because felt they weren't working. Had recent paracentesis with abut 1.5L out. Have encouraged to resume diuretics on M/W/F and more frequently if they help. Continue protein supplementation. Prognosis remains poor.   Arvilla Meres, MD  12:42 AM

## 2017-03-18 ENCOUNTER — Other Ambulatory Visit: Payer: Self-pay | Admitting: Internal Medicine

## 2017-03-19 LAB — BASIC METABOLIC PANEL
BUN/Creatinine Ratio: 17 (ref 10–24)
BUN: 21 mg/dL (ref 8–27)
CALCIUM: 7.6 mg/dL — AB (ref 8.6–10.2)
CHLORIDE: 108 mmol/L — AB (ref 96–106)
CO2: 21 mmol/L (ref 20–29)
Creatinine, Ser: 1.25 mg/dL (ref 0.76–1.27)
GFR calc Af Amer: 65 mL/min/{1.73_m2} (ref >59)
GFR calc non Af Amer: 56 mL/min/{1.73_m2} — ABNORMAL LOW (ref >59)
GLUCOSE: 73 mg/dL (ref 65–99)
Potassium: 4.1 mmol/L (ref 3.5–5.2)
Sodium: 143 mmol/L (ref 134–144)

## 2017-03-23 ENCOUNTER — Other Ambulatory Visit (HOSPITAL_COMMUNITY): Payer: Self-pay | Admitting: Internal Medicine

## 2017-03-25 ENCOUNTER — Ambulatory Visit: Payer: Medicare Other

## 2017-03-25 DIAGNOSIS — I6523 Occlusion and stenosis of bilateral carotid arteries: Secondary | ICD-10-CM

## 2017-03-31 ENCOUNTER — Telehealth: Payer: Self-pay | Admitting: *Deleted

## 2017-03-31 DIAGNOSIS — E041 Nontoxic single thyroid nodule: Secondary | ICD-10-CM

## 2017-03-31 NOTE — Telephone Encounter (Signed)
Pt daughter [facility] Hilbert Corrigan) voiced understanding - orders placed and will forward to schedulers - routed to pcp

## 2017-03-31 NOTE — Telephone Encounter (Signed)
-----   Message from Antoine Poche, MD sent at 03/31/2017  1:46 PM EDT ----- Moderate blockages by carotid US on both sides, we will continue to monitor. Some abnormalities noted in the thyroid, please order a thyroid ultrasound for thyroid cyst.  Dominga Ferry MD

## 2017-04-01 ENCOUNTER — Telehealth: Payer: Self-pay | Admitting: Cardiology

## 2017-04-01 ENCOUNTER — Telehealth: Payer: Self-pay | Admitting: *Deleted

## 2017-04-01 NOTE — Telephone Encounter (Signed)
-----   Message from Megan Salon sent at 04/01/2017  9:37 AM EDT ----- August 2 -arrive at 1115am to register Scheduled for 1130 am You are going to call patient.    ----- Message ----- From: Albertine Patricia, CMA Sent: 03/31/2017   4:48 PM To: Megan Salon  Can you please schedule Thyroid US for Thyroid cyst - I can call pt with date/time   [facility]

## 2017-04-01 NOTE — Telephone Encounter (Signed)
Daughter Lorrie (DPR) made aware

## 2017-04-05 ENCOUNTER — Other Ambulatory Visit (HOSPITAL_COMMUNITY): Payer: Self-pay | Admitting: Internal Medicine

## 2017-04-05 DIAGNOSIS — R188 Other ascites: Secondary | ICD-10-CM

## 2017-04-08 ENCOUNTER — Ambulatory Visit (HOSPITAL_COMMUNITY): Payer: Medicare Other

## 2017-04-08 ENCOUNTER — Ambulatory Visit (HOSPITAL_COMMUNITY): Admission: RE | Admit: 2017-04-08 | Payer: Medicare Other | Source: Ambulatory Visit

## 2017-04-28 NOTE — Telephone Encounter (Signed)
Patient deceased

## 2017-05-08 DEATH — deceased

## 2017-05-20 ENCOUNTER — Ambulatory Visit: Payer: Self-pay | Admitting: Internal Medicine
